# Patient Record
Sex: Female | Born: 1935 | Race: White | Hispanic: No | Smoking: Former smoker
Health system: Southern US, Community
[De-identification: ages and names within clinical notes are randomized; demographics above are authoritative.]

## PROBLEM LIST (undated history)

## (undated) DIAGNOSIS — S62609A Fracture of unspecified phalanx of unspecified finger, initial encounter for closed fracture: Secondary | ICD-10-CM

## (undated) DIAGNOSIS — N289 Disorder of kidney and ureter, unspecified: Secondary | ICD-10-CM

## (undated) DIAGNOSIS — Z950 Presence of cardiac pacemaker: Secondary | ICD-10-CM

## (undated) DIAGNOSIS — J449 Chronic obstructive pulmonary disease, unspecified: Secondary | ICD-10-CM

## (undated) DIAGNOSIS — J189 Pneumonia, unspecified organism: Secondary | ICD-10-CM

## (undated) DIAGNOSIS — E039 Hypothyroidism, unspecified: Secondary | ICD-10-CM

## (undated) DIAGNOSIS — F0390 Unspecified dementia without behavioral disturbance: Secondary | ICD-10-CM

## (undated) DIAGNOSIS — I10 Essential (primary) hypertension: Secondary | ICD-10-CM

## (undated) DIAGNOSIS — I4891 Unspecified atrial fibrillation: Secondary | ICD-10-CM

## (undated) DIAGNOSIS — H547 Unspecified visual loss: Secondary | ICD-10-CM

## (undated) DIAGNOSIS — F319 Bipolar disorder, unspecified: Secondary | ICD-10-CM

## (undated) DIAGNOSIS — I5030 Unspecified diastolic (congestive) heart failure: Secondary | ICD-10-CM

## (undated) DIAGNOSIS — F419 Anxiety disorder, unspecified: Secondary | ICD-10-CM

## (undated) HISTORY — DX: Disorder of kidney and ureter, unspecified: N28.9

## (undated) HISTORY — DX: Fracture of unspecified phalanx of unspecified finger, initial encounter for closed fracture: S62.609A

## (undated) HISTORY — DX: Hypothyroidism, unspecified: E03.9

## (undated) HISTORY — DX: Presence of cardiac pacemaker: Z95.0

## (undated) HISTORY — DX: Essential (primary) hypertension: I10

## (undated) HISTORY — DX: Unspecified visual loss: H54.7

## (undated) HISTORY — DX: Chronic obstructive pulmonary disease, unspecified: J44.9

## (undated) HISTORY — PX: INSERT / REPLACE / REMOVE PACEMAKER: SUR710

---

## 2011-01-11 ENCOUNTER — Emergency Department (HOSPITAL_COMMUNITY)
Admission: EM | Admit: 2011-01-11 | Discharge: 2011-01-11 | Disposition: A | Discharge status: Home / Self Care | Payer: MEDICARE | Attending: Emergency Medicine | Admitting: Emergency Medicine

## 2011-01-11 ENCOUNTER — Encounter (HOSPITAL_COMMUNITY): Payer: Self-pay

## 2011-01-11 ENCOUNTER — Emergency Department (HOSPITAL_COMMUNITY): Payer: MEDICARE

## 2011-01-11 DIAGNOSIS — W19XXXA Unspecified fall, initial encounter: Secondary | ICD-10-CM | POA: Insufficient documentation

## 2011-01-11 DIAGNOSIS — M25529 Pain in unspecified elbow: Secondary | ICD-10-CM | POA: Insufficient documentation

## 2011-01-11 DIAGNOSIS — J4489 Other specified chronic obstructive pulmonary disease: Secondary | ICD-10-CM | POA: Insufficient documentation

## 2011-01-11 DIAGNOSIS — M542 Cervicalgia: Secondary | ICD-10-CM | POA: Insufficient documentation

## 2011-01-11 DIAGNOSIS — Y9210 Unspecified residential institution as the place of occurrence of the external cause: Secondary | ICD-10-CM | POA: Insufficient documentation

## 2011-01-11 DIAGNOSIS — J449 Chronic obstructive pulmonary disease, unspecified: Secondary | ICD-10-CM | POA: Insufficient documentation

## 2011-01-11 DIAGNOSIS — E039 Hypothyroidism, unspecified: Secondary | ICD-10-CM | POA: Insufficient documentation

## 2011-01-11 DIAGNOSIS — H543 Unqualified visual loss, both eyes: Secondary | ICD-10-CM | POA: Insufficient documentation

## 2011-01-11 DIAGNOSIS — T148XXA Other injury of unspecified body region, initial encounter: Secondary | ICD-10-CM | POA: Insufficient documentation

## 2011-01-11 DIAGNOSIS — I10 Essential (primary) hypertension: Secondary | ICD-10-CM | POA: Insufficient documentation

## 2011-01-11 DIAGNOSIS — I495 Sick sinus syndrome: Secondary | ICD-10-CM | POA: Insufficient documentation

## 2011-01-11 DIAGNOSIS — Z950 Presence of cardiac pacemaker: Secondary | ICD-10-CM | POA: Insufficient documentation

## 2011-01-11 DIAGNOSIS — R51 Headache: Secondary | ICD-10-CM | POA: Insufficient documentation

## 2011-01-11 DIAGNOSIS — F319 Bipolar disorder, unspecified: Secondary | ICD-10-CM | POA: Insufficient documentation

## 2011-03-30 ENCOUNTER — Emergency Department (HOSPITAL_COMMUNITY)
Admission: EM | Admit: 2011-03-30 | Discharge: 2011-03-30 | Disposition: A | Discharge status: Home / Self Care | Payer: BC Managed Care – PPO | Attending: Emergency Medicine | Admitting: Emergency Medicine

## 2011-03-30 ENCOUNTER — Emergency Department (HOSPITAL_COMMUNITY): Payer: BC Managed Care – PPO

## 2011-03-30 DIAGNOSIS — J449 Chronic obstructive pulmonary disease, unspecified: Secondary | ICD-10-CM | POA: Insufficient documentation

## 2011-03-30 DIAGNOSIS — Z79899 Other long term (current) drug therapy: Secondary | ICD-10-CM | POA: Insufficient documentation

## 2011-03-30 DIAGNOSIS — H543 Unqualified visual loss, both eyes: Secondary | ICD-10-CM | POA: Insufficient documentation

## 2011-03-30 DIAGNOSIS — E039 Hypothyroidism, unspecified: Secondary | ICD-10-CM | POA: Insufficient documentation

## 2011-03-30 DIAGNOSIS — F319 Bipolar disorder, unspecified: Secondary | ICD-10-CM | POA: Insufficient documentation

## 2011-03-30 DIAGNOSIS — R070 Pain in throat: Secondary | ICD-10-CM | POA: Insufficient documentation

## 2011-03-30 DIAGNOSIS — J4489 Other specified chronic obstructive pulmonary disease: Secondary | ICD-10-CM | POA: Insufficient documentation

## 2011-03-30 DIAGNOSIS — I10 Essential (primary) hypertension: Secondary | ICD-10-CM | POA: Insufficient documentation

## 2011-03-30 DIAGNOSIS — Z950 Presence of cardiac pacemaker: Secondary | ICD-10-CM | POA: Insufficient documentation

## 2011-03-30 LAB — CBC
HCT: 42.1 % (ref 36.0–46.0)
Hemoglobin: 14.5 g/dL (ref 12.0–15.0)
MCH: 34.1 pg — ABNORMAL HIGH (ref 26.0–34.0)
MCHC: 34.4 g/dL (ref 30.0–36.0)
MCV: 99.1 fL (ref 78.0–100.0)
Platelets: 171 K/uL (ref 150–400)
RBC: 4.25 MIL/uL (ref 3.87–5.11)
RDW: 13.1 % (ref 11.5–15.5)
WBC: 7.1 K/uL (ref 4.0–10.5)

## 2011-03-30 LAB — RAPID STREP SCREEN (MED CTR MEBANE ONLY): Streptococcus, Group A Screen (Direct): NEGATIVE

## 2011-03-30 LAB — URINALYSIS, ROUTINE W REFLEX MICROSCOPIC
Bilirubin Urine: NEGATIVE
Glucose, UA: NEGATIVE mg/dL
Hgb urine dipstick: NEGATIVE
Ketones, ur: NEGATIVE mg/dL
Nitrite: NEGATIVE
Protein, ur: NEGATIVE mg/dL
Specific Gravity, Urine: 1.012 (ref 1.005–1.030)
Urobilinogen, UA: 0.2 mg/dL (ref 0.0–1.0)
pH: 8 (ref 5.0–8.0)

## 2011-03-30 LAB — BASIC METABOLIC PANEL
Chloride: 106 mEq/L (ref 96–112)
Creatinine, Ser: 0.77 mg/dL (ref 0.4–1.2)
GFR calc Af Amer: >60 mL/min (ref >60)
Potassium: 4.1 mEq/L (ref 3.5–5.1)
Sodium: 141 mEq/L (ref 135–145)

## 2011-03-30 LAB — POCT CARDIAC MARKERS
CKMB, poc: <1 ng/mL — ABNORMAL LOW (ref 1.0–8.0)
Troponin i, poc: <0.05 ng/mL (ref 0.00–0.09)

## 2011-03-30 LAB — DIFFERENTIAL
Basophils Absolute: 0 10*3/uL (ref 0.0–0.1)
Eosinophils Relative: 2 % (ref 0–5)
Lymphocytes Relative: 25 % (ref 12–46)
Monocytes Relative: 13 % — ABNORMAL HIGH (ref 3–12)

## 2011-03-30 MED ORDER — IOHEXOL 300 MG/ML  SOLN
75.0000 mL | Freq: Once | INTRAMUSCULAR | Status: AC | PRN
  Administered 2011-03-30: 75 mL via INTRAVENOUS

## 2011-04-01 LAB — URINE CULTURE

## 2011-04-22 ENCOUNTER — Emergency Department (HOSPITAL_COMMUNITY): Payer: Medicare Other

## 2011-04-22 ENCOUNTER — Emergency Department (HOSPITAL_COMMUNITY)
Admission: EM | Admit: 2011-04-22 | Discharge: 2011-04-22 | Disposition: A | Discharge status: Home / Self Care | Payer: Medicare Other | Attending: Emergency Medicine | Admitting: Emergency Medicine

## 2011-04-22 DIAGNOSIS — E039 Hypothyroidism, unspecified: Secondary | ICD-10-CM | POA: Insufficient documentation

## 2011-04-22 DIAGNOSIS — Z950 Presence of cardiac pacemaker: Secondary | ICD-10-CM | POA: Insufficient documentation

## 2011-04-22 DIAGNOSIS — R5381 Other malaise: Secondary | ICD-10-CM | POA: Insufficient documentation

## 2011-04-22 DIAGNOSIS — E860 Dehydration: Secondary | ICD-10-CM | POA: Insufficient documentation

## 2011-04-22 DIAGNOSIS — I10 Essential (primary) hypertension: Secondary | ICD-10-CM | POA: Insufficient documentation

## 2011-04-22 DIAGNOSIS — R197 Diarrhea, unspecified: Secondary | ICD-10-CM | POA: Insufficient documentation

## 2011-04-22 LAB — COMPREHENSIVE METABOLIC PANEL
Albumin: 3 g/dL — ABNORMAL LOW (ref 3.5–5.2)
BUN: 14 mg/dL (ref 6–23)
Chloride: 105 mEq/L (ref 96–112)
Creatinine, Ser: 0.69 mg/dL (ref 0.4–1.2)
GFR calc non Af Amer: >60 mL/min (ref >60)
Total Bilirubin: 0.1 mg/dL — ABNORMAL LOW (ref 0.3–1.2)

## 2011-04-22 LAB — MAGNESIUM: Magnesium: 2.1 mg/dL (ref 1.5–2.5)

## 2011-04-27 ENCOUNTER — Encounter: Payer: Self-pay | Admitting: Internal Medicine

## 2011-05-17 ENCOUNTER — Encounter: Payer: Self-pay | Admitting: Internal Medicine

## 2011-05-19 ENCOUNTER — Institutional Professional Consult (permissible substitution): Payer: Medicare Other | Admitting: Internal Medicine

## 2011-05-23 ENCOUNTER — Emergency Department (HOSPITAL_COMMUNITY): Payer: Medicare Other

## 2011-05-23 ENCOUNTER — Inpatient Hospital Stay (HOSPITAL_COMMUNITY)
Admission: EM | Admit: 2011-05-23 | Discharge: 2011-05-31 | DRG: 291 | Disposition: A | Discharge status: Skilled Nursing Facility | Payer: Medicare Other | Attending: Internal Medicine | Admitting: Internal Medicine

## 2011-05-23 DIAGNOSIS — G9341 Metabolic encephalopathy: Secondary | ICD-10-CM | POA: Diagnosis present

## 2011-05-23 DIAGNOSIS — Y9210 Unspecified residential institution as the place of occurrence of the external cause: Secondary | ICD-10-CM | POA: Diagnosis present

## 2011-05-23 DIAGNOSIS — I509 Heart failure, unspecified: Secondary | ICD-10-CM

## 2011-05-23 DIAGNOSIS — J189 Pneumonia, unspecified organism: Secondary | ICD-10-CM | POA: Diagnosis present

## 2011-05-23 DIAGNOSIS — Z950 Presence of cardiac pacemaker: Secondary | ICD-10-CM

## 2011-05-23 DIAGNOSIS — J4489 Other specified chronic obstructive pulmonary disease: Secondary | ICD-10-CM | POA: Diagnosis present

## 2011-05-23 DIAGNOSIS — J9600 Acute respiratory failure, unspecified whether with hypoxia or hypercapnia: Secondary | ICD-10-CM | POA: Diagnosis present

## 2011-05-23 DIAGNOSIS — H548 Legal blindness, as defined in USA: Secondary | ICD-10-CM | POA: Diagnosis present

## 2011-05-23 DIAGNOSIS — F319 Bipolar disorder, unspecified: Secondary | ICD-10-CM | POA: Diagnosis present

## 2011-05-23 DIAGNOSIS — I5031 Acute diastolic (congestive) heart failure: Principal | ICD-10-CM | POA: Diagnosis present

## 2011-05-23 DIAGNOSIS — E039 Hypothyroidism, unspecified: Secondary | ICD-10-CM | POA: Diagnosis present

## 2011-05-23 DIAGNOSIS — J449 Chronic obstructive pulmonary disease, unspecified: Secondary | ICD-10-CM | POA: Diagnosis present

## 2011-05-23 DIAGNOSIS — W010XXA Fall on same level from slipping, tripping and stumbling without subsequent striking against object, initial encounter: Secondary | ICD-10-CM | POA: Diagnosis present

## 2011-05-23 DIAGNOSIS — S62009A Unspecified fracture of navicular [scaphoid] bone of unspecified wrist, initial encounter for closed fracture: Secondary | ICD-10-CM | POA: Diagnosis present

## 2011-05-23 DIAGNOSIS — I10 Essential (primary) hypertension: Secondary | ICD-10-CM | POA: Diagnosis present

## 2011-05-23 LAB — CBC
HCT: 39.7 % (ref 36.0–46.0)
Hemoglobin: 13.2 g/dL (ref 12.0–15.0)
MCH: 33.2 pg (ref 26.0–34.0)
MCHC: 33.2 g/dL (ref 30.0–36.0)
MCV: 100 fL (ref 78.0–100.0)

## 2011-05-23 LAB — BASIC METABOLIC PANEL
BUN: 18 mg/dL (ref 6–23)
CO2: 27 mEq/L (ref 19–32)
Calcium: 8.5 mg/dL (ref 8.4–10.5)
GFR calc non Af Amer: >60 mL/min (ref >60)
Glucose, Bld: 88 mg/dL (ref 70–99)

## 2011-05-23 LAB — PRO B NATRIURETIC PEPTIDE: Pro B Natriuretic peptide (BNP): 207.4 pg/mL (ref 0–450)

## 2011-05-23 LAB — CARDIAC PANEL(CRET KIN+CKTOT+MB+TROPI)
CK, MB: 2.5 ng/mL (ref 0.3–4.0)
Relative Index: INVALID (ref 0.0–2.5)
Total CK: 50 U/L (ref 7–177)

## 2011-05-23 LAB — URINALYSIS, ROUTINE W REFLEX MICROSCOPIC
Bilirubin Urine: NEGATIVE
Ketones, ur: NEGATIVE mg/dL
Nitrite: NEGATIVE
Urobilinogen, UA: 0.2 mg/dL (ref 0.0–1.0)
pH: 7.5 (ref 5.0–8.0)

## 2011-05-23 LAB — URINE MICROSCOPIC-ADD ON

## 2011-05-23 LAB — MRSA PCR SCREENING: MRSA by PCR: NEGATIVE

## 2011-05-24 ENCOUNTER — Inpatient Hospital Stay (HOSPITAL_COMMUNITY): Payer: Medicare Other

## 2011-05-24 DIAGNOSIS — I517 Cardiomegaly: Secondary | ICD-10-CM

## 2011-05-24 DIAGNOSIS — I5023 Acute on chronic systolic (congestive) heart failure: Secondary | ICD-10-CM

## 2011-05-24 LAB — LIPID PANEL
HDL: 64 mg/dL (ref >39)
LDL Cholesterol: 50 mg/dL (ref 0–99)
Total CHOL/HDL Ratio: 2.1 RATIO
VLDL: 22 mg/dL (ref 0–40)

## 2011-05-24 LAB — CBC
MCH: 32.7 pg (ref 26.0–34.0)
MCV: 101 fL — ABNORMAL HIGH (ref 78.0–100.0)
Platelets: 158 10*3/uL (ref 150–400)
RBC: 4.19 MIL/uL (ref 3.87–5.11)
RDW: 13.3 % (ref 11.5–15.5)

## 2011-05-24 LAB — BASIC METABOLIC PANEL
CO2: 24 mEq/L (ref 19–32)
Calcium: 9.3 mg/dL (ref 8.4–10.5)
Creatinine, Ser: 0.71 mg/dL (ref 0.50–1.10)
GFR calc Af Amer: >60 mL/min (ref >60)
GFR calc non Af Amer: >60 mL/min (ref >60)
Sodium: 137 mEq/L (ref 135–145)

## 2011-05-24 LAB — HEMOGLOBIN A1C
Hgb A1c MFr Bld: 5.7 % — ABNORMAL HIGH (ref <5.7)
Mean Plasma Glucose: 117 mg/dL — ABNORMAL HIGH (ref <117)

## 2011-05-24 LAB — CARDIAC PANEL(CRET KIN+CKTOT+MB+TROPI)
CK, MB: 2.1 ng/mL (ref 0.3–4.0)
Relative Index: 1.4 (ref 0.0–2.5)
Total CK: 113 U/L (ref 7–177)
Total CK: 49 U/L (ref 7–177)
Troponin I: <0.3 ng/mL (ref <0.30)

## 2011-05-24 LAB — MAGNESIUM: Magnesium: 2.3 mg/dL (ref 1.5–2.5)

## 2011-05-25 ENCOUNTER — Inpatient Hospital Stay (HOSPITAL_COMMUNITY): Payer: Medicare Other

## 2011-05-25 LAB — DIFFERENTIAL
Basophils Absolute: 0 10*3/uL (ref 0.0–0.1)
Eosinophils Relative: 0 % (ref 0–5)
Lymphocytes Relative: 13 % (ref 12–46)
Monocytes Relative: 26 % — ABNORMAL HIGH (ref 3–12)
Neutro Abs: 5.2 10*3/uL (ref 1.7–7.7)

## 2011-05-25 LAB — CBC
Hemoglobin: 13.5 g/dL (ref 12.0–15.0)
MCV: 99.5 fL (ref 78.0–100.0)
Platelets: 154 10*3/uL (ref 150–400)
RBC: 4.12 MIL/uL (ref 3.87–5.11)
WBC: 8.5 10*3/uL (ref 4.0–10.5)

## 2011-05-25 LAB — BASIC METABOLIC PANEL
CO2: 25 mEq/L (ref 19–32)
Chloride: 102 mEq/L (ref 96–112)
Glucose, Bld: 116 mg/dL — ABNORMAL HIGH (ref 70–99)
Sodium: 139 mEq/L (ref 135–145)

## 2011-05-25 LAB — BLOOD GAS, ARTERIAL
Bicarbonate: 25.7 mEq/L — ABNORMAL HIGH (ref 20.0–24.0)
O2 Saturation: 91.2 %
Patient temperature: 98.6
TCO2: 22.1 mmol/L (ref 0–100)
pO2, Arterial: 56.4 mmHg — ABNORMAL LOW (ref 80.0–100.0)

## 2011-05-25 NOTE — Consult Note (Signed)
NAMEJAYNIE, [name]              ACCOUNT NO.:  [id number]  MEDICAL RECORD NO.:  [id number]  LOCATION:  1502                         FACILITY:  [facility]  PHYSICIAN:  Verne Carrow, MDDATE OF BIRTH:  [birth year]  DATE OF CONSULTATION:  05/23/2011 DATE OF DISCHARGE:                                CONSULTATION   REASON FOR CONSULTATION:  Congestive heart failure.  HISTORY OF PRESENT ILLNESS:  [name] is a pleasant 77 year old Caucasian female with a history of pacemaker insertion for unknown reason in [place] in 2011, COPD, hypothyroidism, hypertension and legal blindness who was admitted to [facility] after a fall. The patient states that she felt dizzy and tripped on her bed sheet. Review of radiology film showed no evidence of fracture.  The patient denies having had any chest pain or shortness of breath.  We are consulted for potential assistance in evaluation and management of congestive heart failure.  The patient's cardiac history is unknown as this was all performed in hospital in [place].  PAST MEDICAL HISTORY: 1. COPD. 2. Hypothyroidism. 3. Legal blindness. 4. Manic depressive disorder. 5. Hypertension. 6. Status post pacemaker insertion in July 2011 in [facility]     in [place].  PAST SURGICAL HISTORY: 1. Pacemaker insertion. 2. Right oophorectomy.  ALLERGIES:  No known drug allergies.  CURRENT MEDICATIONS: 1. Depakote 500 mg p.o. b.i.d. 2. Lovenox 40 mg subcutaneously once daily. 3. Lasix 20 mg IV b.i.d. 4. Synthroid 100 mcg p.o. once daily. 5. Ditropan 2.5 mg p.o. twice daily. 6. Potassium chloride 20 mEq p.o. twice daily. 7. Risperdal 1 mg p.o. b.i.d.  SOCIAL HISTORY:  The patient is a former heavy tobacco abuser, having smoked 1 pack per day for 6 years.  She tells me that she stopped smoking 2 years ago.  She denies use of alcohol or drugs.  She is currently widowed.  She lives here in [place] in retirement  home. She relocated to [facility] within the last 12 months, although she is not sure when.  FAMILY HISTORY:  There is no family history of coronary artery disease.  REVIEW OF SYSTEMS:  As stated in the history of present illness and is otherwise negative.  PHYSICAL EXAMINATION:  VITAL SIGNS:  Temperature 98.7, pulse 69 and regular.  Respirations 18 and unlabored, blood pressure 101/70. GENERAL:  She is a pleasant elderly Caucasian female in no acute distress.  She is alert and oriented x3. PSYCHIATRIC:  Mood and affect are appropriate. NEUROLOGICAL:  Nonfocal. MUSCULOSKELETAL:  Moves all extremities equally.  There is a brace present in the left wrist. SKIN:  Warm and dry. HEENT:  Poor dentition.  Mucous membranes are moist. NECK:  No JVD.  No carotid bruits. LUNGS:  Clear to auscultation bilaterally without wheezes, rhonchi or crackles noted. CARDIOVASCULAR:  Regular rate and rhythm without murmurs, gallops or rubs noted. ABDOMEN:  Soft, nontender.  Bowel sounds are present. EXTREMITIES:  No evidence of edema.  All extremities are warm to touch.  DIAGNOSTIC STUDIES: 1. Laboratory value show a hemoglobin of 13, platelets 129, white     blood cell count 11,000, potassium 3.6, creatinine 0.76, CK 45, CK-     MB 2.1, troponin less  than 0.30. 2. No 12-lead EKG is available for review. 3. Chest x-ray performed today shows findings of chronic persistent     pulmonary edema.  There is also a lung nodule that was not     mentioned on prior films. 4. CT of the head shows no acute abnormality.  ASSESSMENT/PLAN: 1. Dizziness with subsequent fall which may have been related to     patient losing her balance after tripping.  There is no clear     evidence of myocardial ischemia.  First set of cardiac enzymes are     negative.  I would agree with checking an echocardiogram to assess     her LV function.  We will monitor her on telemetry to see if there     is any evidence of heart block  or arrhythmias.  I would also agree     with cycling cardiac enzymes.  I would also like to get a 12-lead     EKG at this time as there is none available for review. 2. Mild volume overload consistent with congestive heart failure.  Her     x-ray has shown chronic changes while looking at serial imaging.     At this time, I would recommend continuing the Lasix and potassium     supplementation as written.  I would continue to follow with you     and make further recommendations as testing is complete.     Verne Carrow, MD     CM/MEDQ  D:  05/23/2011  T:  05/23/2011  Job:  [id number]  Electronically Signed by Verne Carrow MD on 05/25/2011 08:25:04 AM

## 2011-05-29 LAB — CBC
MCH: 32.9 pg (ref 26.0–34.0)
MCHC: 32.8 g/dL (ref 30.0–36.0)
Platelets: 224 10*3/uL (ref 150–400)
RDW: 13 % (ref 11.5–15.5)

## 2011-05-29 LAB — BASIC METABOLIC PANEL
Calcium: 10.3 mg/dL (ref 8.4–10.5)
GFR calc Af Amer: >60 mL/min (ref >60)
GFR calc non Af Amer: >60 mL/min (ref >60)
Potassium: 4.8 mEq/L (ref 3.5–5.1)
Sodium: 136 mEq/L (ref 135–145)

## 2011-05-29 LAB — MAGNESIUM: Magnesium: 2.2 mg/dL (ref 1.5–2.5)

## 2011-06-05 ENCOUNTER — Inpatient Hospital Stay (HOSPITAL_COMMUNITY)
Admission: EM | Admit: 2011-06-05 | Discharge: 2011-06-14 | DRG: 194 | Disposition: A | Discharge status: Skilled Nursing Facility | Payer: Medicare Other | Attending: Internal Medicine | Admitting: Internal Medicine

## 2011-06-05 ENCOUNTER — Emergency Department (HOSPITAL_COMMUNITY): Payer: Medicare Other

## 2011-06-05 DIAGNOSIS — R1310 Dysphagia, unspecified: Secondary | ICD-10-CM | POA: Diagnosis present

## 2011-06-05 DIAGNOSIS — I10 Essential (primary) hypertension: Secondary | ICD-10-CM | POA: Diagnosis present

## 2011-06-05 DIAGNOSIS — F03918 Unspecified dementia, unspecified severity, with other behavioral disturbance: Secondary | ICD-10-CM | POA: Diagnosis present

## 2011-06-05 DIAGNOSIS — H543 Unqualified visual loss, both eyes: Secondary | ICD-10-CM | POA: Diagnosis present

## 2011-06-05 DIAGNOSIS — E039 Hypothyroidism, unspecified: Secondary | ICD-10-CM | POA: Diagnosis present

## 2011-06-05 DIAGNOSIS — J4489 Other specified chronic obstructive pulmonary disease: Secondary | ICD-10-CM | POA: Diagnosis present

## 2011-06-05 DIAGNOSIS — F0391 Unspecified dementia with behavioral disturbance: Secondary | ICD-10-CM | POA: Diagnosis present

## 2011-06-05 DIAGNOSIS — Z950 Presence of cardiac pacemaker: Secondary | ICD-10-CM

## 2011-06-05 DIAGNOSIS — J449 Chronic obstructive pulmonary disease, unspecified: Secondary | ICD-10-CM | POA: Diagnosis present

## 2011-06-05 DIAGNOSIS — F319 Bipolar disorder, unspecified: Secondary | ICD-10-CM | POA: Diagnosis present

## 2011-06-05 DIAGNOSIS — I4891 Unspecified atrial fibrillation: Secondary | ICD-10-CM | POA: Diagnosis not present

## 2011-06-05 DIAGNOSIS — J189 Pneumonia, unspecified organism: Principal | ICD-10-CM | POA: Diagnosis present

## 2011-06-05 DIAGNOSIS — I959 Hypotension, unspecified: Secondary | ICD-10-CM | POA: Diagnosis not present

## 2011-06-05 DIAGNOSIS — H1045 Other chronic allergic conjunctivitis: Secondary | ICD-10-CM | POA: Diagnosis present

## 2011-06-06 ENCOUNTER — Emergency Department (HOSPITAL_COMMUNITY): Payer: Medicare Other

## 2011-06-06 LAB — CARDIAC PANEL(CRET KIN+CKTOT+MB+TROPI)
CK, MB: 3.4 ng/mL (ref 0.3–4.0)
CK, MB: 3.3 ng/mL (ref 0.3–4.0)
CK, MB: 3.5 ng/mL (ref 0.3–4.0)
Relative Index: INVALID (ref 0.0–2.5)
Relative Index: INVALID (ref 0.0–2.5)
Total CK: 71 U/L (ref 7–177)
Total CK: 82 U/L (ref 7–177)
Troponin I: <0.3 ng/mL (ref <0.30)
Troponin I: <0.3 ng/mL (ref <0.30)
Troponin I: <0.3 ng/mL (ref <0.30)

## 2011-06-06 LAB — BASIC METABOLIC PANEL
BUN: 16 mg/dL (ref 6–23)
BUN: 20 mg/dL (ref 6–23)
CO2: 29 mEq/L (ref 19–32)
CO2: 31 mEq/L (ref 19–32)
Calcium: 9 mg/dL (ref 8.4–10.5)
Calcium: 9.8 mg/dL (ref 8.4–10.5)
Chloride: 100 mEq/L (ref 96–112)
Chloride: 101 mEq/L (ref 96–112)
Creatinine, Ser: 0.8 mg/dL (ref 0.50–1.10)
Creatinine, Ser: 0.85 mg/dL (ref 0.50–1.10)
GFR calc Af Amer: >60 mL/min (ref >60)
GFR calc Af Amer: >60 mL/min (ref >60)
GFR calc non Af Amer: >60 mL/min (ref >60)
GFR calc non Af Amer: >60 mL/min (ref >60)
Glucose, Bld: 108 mg/dL — ABNORMAL HIGH (ref 70–99)
Glucose, Bld: 96 mg/dL (ref 70–99)
Potassium: 3.7 mEq/L (ref 3.5–5.1)
Potassium: 3.9 mEq/L (ref 3.5–5.1)
Sodium: 139 mEq/L (ref 135–145)
Sodium: 140 mEq/L (ref 135–145)

## 2011-06-06 LAB — CBC
HCT: 36.1 % (ref 36.0–46.0)
HCT: 38.6 % (ref 36.0–46.0)
Hemoglobin: 12.2 g/dL (ref 12.0–15.0)
Hemoglobin: 13.2 g/dL (ref 12.0–15.0)
MCH: 33.6 pg (ref 26.0–34.0)
MCH: 34 pg (ref 26.0–34.0)
MCHC: 33.8 g/dL (ref 30.0–36.0)
MCHC: 34.2 g/dL (ref 30.0–36.0)
MCV: 99.4 fL (ref 78.0–100.0)
MCV: 99.5 fL (ref 78.0–100.0)
Platelets: 282 10*3/uL (ref 150–400)
Platelets: 292 10*3/uL (ref 150–400)
RBC: 3.63 MIL/uL — ABNORMAL LOW (ref 3.87–5.11)
RBC: 3.88 MIL/uL (ref 3.87–5.11)
RDW: 13.1 % (ref 11.5–15.5)
RDW: 13.1 % (ref 11.5–15.5)
WBC: 11.1 10*3/uL — ABNORMAL HIGH (ref 4.0–10.5)
WBC: 12.9 10*3/uL — ABNORMAL HIGH (ref 4.0–10.5)

## 2011-06-06 LAB — DIFFERENTIAL
Basophils Absolute: 0 10*3/uL (ref 0.0–0.1)
Basophils Relative: 0 % (ref 0–1)
Eosinophils Absolute: 0.2 10*3/uL (ref 0.0–0.7)
Monocytes Relative: 7 % (ref 3–12)
Neutro Abs: 8.7 10*3/uL — ABNORMAL HIGH (ref 1.7–7.7)
Neutrophils Relative %: 68 % (ref 43–77)

## 2011-06-06 LAB — PRO B NATRIURETIC PEPTIDE: Pro B Natriuretic peptide (BNP): 202.1 pg/mL (ref 0–450)

## 2011-06-06 MED ORDER — IOHEXOL 300 MG/ML  SOLN
100.0000 mL | Freq: Once | INTRAMUSCULAR | Status: AC | PRN
  Administered 2011-06-06: 100 mL via INTRAVENOUS

## 2011-06-06 NOTE — H&P (Signed)
[name], [name]              ACCOUNT NO.:  [id number]  MEDICAL RECORD NO.:  [id number]  LOCATION:  [facility]                         FACILITY:  [facility]  PHYSICIAN:  Hilary Hertz, MD      DATE OF BIRTH:  [birth year]  DATE OF ADMISSION:  06/05/2011 DATE OF DISCHARGE:                             HISTORY & PHYSICAL   PRIMARY CARE PHYSICIAN:  Unassigned.  CHIEF COMPLAINT:  Chest pain.  HISTORY OF PRESENT ILLNESS:  The patient is an 77 year old woman with a history of COPD, hypothyroidism, bipolar disorder who complains of chest pain.  The patient was recently admitted and discharged at the middle of July for mechanical fall and shortness of breath.  The patient underwent a workup for acute coronary syndrome as the cause of the fall which was negative.  She underwent an echo which showed an EF of 60-65% and she was diagnosed with a scaphoid fracture during that admission.  The patient was discharged to a rehab facility to get physical therapy.  She returns today with chest pain.  During that admission, the patient was also treated for community-acquired pneumonia.  She returns today with chest pain and shortness of breath.  She reports that she has left-sided chest pain underneath her breast that hurts every time she takes a deep breath.  She is also reporting a cough.  The pain at this point is 6/10 and sharp and worse with deep breathing.  It is unknown if she has had any fevers.  She denies any other acute complaints.  REVIEW OF SYSTEMS:  Review of 10 organ systems was done and is negative except as stated above in the HPI.  ALLERGIES:  No known drug allergies.  MEDICATIONS: 1. Melatonin 1 tab daily. 2. Tylenol as needed. 3. Senna 1 tab twice daily. 4. Risperdal 1 mg twice daily. 5. Oxybutynin 2.5 mg once daily. 6. Os-Cal three times a day. 7. MiraLax as needed. 8. Clotrimazole/betamethasone cream applied three times a day under     her breast. 9. Levothyroxine 100 mcg  daily. 10.Guaifenesin and dextromethorphan every 8 hours as needed. 11.Divalproex 500 mg twice a day. 12.Ambien as needed 10 mg.  PAST MEDICAL HISTORY: 1. COPD. 2. Hypothyroidism. 3. Legal blindness. 4. Manic depressive disorder/bipolar disorder. 5. Hypertension. 6. Pacemaker, July 2011 in [place].  SOCIAL HISTORY:  The patient has a 40 pack-year smoking history.  She quit 2 years ago.  She uses alcohol occasionally.  Family history is negative for early CAD.  PHYSICAL EXAMINATION:  VITAL SIGNS:  109/77, 60, 16, 97.0. GENERAL:  The patient is in no acute distress. HEENT:  Mucous membranes are moist.  Poor dentition. CV: Regular rate and rhythm.  No murmurs, rubs, or gallops. LUNGS:  It is difficult for the patient to cooperate with the exam as deep breathing reproduces pain in her left chest.  There are decreased breath sounds throughout.  I do not hear any frank wheezes.  It is difficult to tell if there are any crackles anywhere due to the patient's poor cooperation. ABDOMEN:  Soft, nontender, nondistended. EXTREMITIES:  No lower extremity edema, although her shins are both slightly tender to touch. PSYCH:  Flat affect. NEURO:  Nonfocal.  LABORATORY DATA:  White count 12.9, hemoglobin 13.2, platelets 282. Sodium 140, potassium 3.9, chloride 100, bicarb 31, glucose 96, BUN 20, creatinine 0.85.  Chest x-ray shows interval increased left lung base consolidation, atelectasis versus pneumonia, likely a small associated pleural effusion as well similar cardiomegaly and central vascular congestion, clearly a very mild pulmonary edema pattern.  CT angio chest; negative for acute PE or thoracic aortic dissection, moderate hiatal hernia, patchy airspace infiltrate in the superior segment of the left lower lobe, possible pneumonia.  EKG; A paced at a rate of 67.  No acute ischemic changes.  ASSESSMENT AND PLAN:  The patient is an 77 year old woman with bipolar disorder,  hypertension, chronic obstructive pulmonary disease, recent admission for mechanical fall, and pneumonia who was at rehabilitation comes in with chest pain, 1. Chest pain.  At this point, the patient's chest pain is pleuritic     in nature and her CT scan shows pneumonia in that area.  This is     probably pleurisy from the underlying pneumonia.  We will treat the     patient with broad-spectrum antibiotics, vancomycin and cefepime     given her recent hospitalization.  Given her cardiac risk factors     of age and hypertension, we will rule her out for an acute coronary     syndrome with checking cardiac enzymes and monitoring her on tele.     Her EKG does not show any signs of acute ischemia.  We will treat     the pain with Tylenol and Vicodin and morphine if needed, 2. Healthcare-associated pneumonia or hospital-acquired pneumonia.  We     will put the patient on broad-spectrum antibiotics of vancomycin     and cefepime and we will give her supplemental oxygen as needed. 3. Shortness of breath.  This is likely due to the patient's     pneumonia; however, she does have signs of potential fluid overload     on her imaging.  We will check a beta natriuretic peptide to help     assess her volume status.  An echo done last admission shows that     she has an EF of 60-65% with grade 1 diastolic dysfunction. 4. Bipolar disorder.  Continue the patient's home Risperdal and     divalproex. 5. Hypothyroidism.  Continue the patient's home levothyroxine. 6. Questionable history of chronic obstructive pulmonary disease.  The     patient is not on any therapies for this at this time.  We will     give her nebulizers p.r.n. 7. Questionable history of hypertension by chart review.  The patient     is not currently on any medications for this. 8. Fluids, electrolytes, nutrition.  Hep-Lock IV.  Replace     electrolytes as needed.  Put the patient on a heart     healthy diet. 9. Prophylaxis.  Lovenox  for DVT prophylaxis.  DISPOSITION:  Full code.  The patient will be admitted to [facility] for further evaluation.          ______________________________ Hilary Hertz, MD     JF/MEDQ  D:  06/06/2011  T:  06/06/2011  Job:  [id number]  Electronically Signed by Hilary Hertz MD on 06/06/2011 07:05:18 AM

## 2011-06-07 LAB — CBC
Hemoglobin: 11.7 g/dL — ABNORMAL LOW (ref 12.0–15.0)
RBC: 3.54 MIL/uL — ABNORMAL LOW (ref 3.87–5.11)
WBC: 13.7 10*3/uL — ABNORMAL HIGH (ref 4.0–10.5)

## 2011-06-08 ENCOUNTER — Inpatient Hospital Stay (HOSPITAL_COMMUNITY): Payer: Medicare Other

## 2011-06-08 LAB — CBC
HCT: 34.2 % — ABNORMAL LOW (ref 36.0–46.0)
Hemoglobin: 11.9 g/dL — ABNORMAL LOW (ref 12.0–15.0)
MCHC: 34.8 g/dL (ref 30.0–36.0)

## 2011-06-08 LAB — BASIC METABOLIC PANEL
BUN: 11 mg/dL (ref 6–23)
Chloride: 106 mEq/L (ref 96–112)
GFR calc Af Amer: >60 mL/min (ref >60)
Glucose, Bld: 76 mg/dL (ref 70–99)
Potassium: 4.5 mEq/L (ref 3.5–5.1)

## 2011-06-08 NOTE — Discharge Summary (Signed)
[name], [name]              ACCOUNT NO.:  [id number]  MEDICAL RECORD NO.:  [id number]  LOCATION:  1502                         FACILITY:  [facility]  PHYSICIAN:  Kathlen Mody, MD       DATE OF BIRTH:  1928/06/27  DATE OF ADMISSION:  05/23/2011 DATE OF DISCHARGE:  05/26/2011                              DISCHARGE SUMMARY   PRIMARY CARE PHYSICIAN:  Florentina Jenny, M.D.  DISCHARGE DIAGNOSES: 1. Mechanical fall. 2. Hypothyroidism. 3. Acute diastolic heart failure. 4. Nondisplaced left scaphoid fracture. 5. Status post permanent pacemaker for arrhythmia. 6. Acute respiratory failure secondary to diastolic heart failure. 7. Pneumonia. 8. Chronic obstructive pulmonary disease. 9. Manic depressive behavior. 10.Legally blind.  DISCHARGE MEDICATIONS: 1. Avelox 400 mg p.o. daily for 5 more days. 2. Guaifenesin and dextromethorphan 5 mL q.8 h. p.r.n. 3. Ambien 10 mg 1 tablet at bedtime p.r.n. 4. MiraLAX 17 g p.o. daily p.r.n. 5. Levothyroxine 100 mcg 1 tablet p.o. daily. 6. Depakote 500 mg b.i.d. 7. Senna 1 tablet b.i.d. 8. Oxybutynin 2.5 mg 1 tablet b.i.d. 9. Risperidone 1 mg b.i.d. 10.Calcium carbonate 1 tablet t.i.d. 11.Lotrisone 3 applications daily. 12.Melatonin 1 tablet at bedtime. 13.Tylenol 650 mg p.o. q.4 h. p.r.n. for pain.  PERTINENT LABORATORY DATA:  The patient had a comprehensive metabolic panel on admission significant for glucose of 116.  CBC showed WBC count of 11.1, platelets of 129.  Troponin and CK-MB within normal limits. ProBNP was 207.  Next set of cardiac enzymes were negative.  MRSA PCR screen negative.  Urinalysis was negative for nitrites and leukocytes. TSH within normal limits.  Third set of cardiac enzymes were negative. On the day of discharge, the patient's CBC was within normal limits. Basic metabolic panel significant for glucose of 116.  RADIOLOGY: 1. On admission, the patient had a chest x-ray, showed mild CHF,     diffuse aeration of both  lung bases representing areas of edema and     multifocal consolidation. 2. Pelvic x-ray on May 23, 2011 shows no evidence of acute bony     abnormality, osteopenia, and mild degenerative changes. 3. Wrist x-ray on May 23, 2011 shows serpiginous lucency through the     mid aspect of the scaphoid possibly representing a nondisplaced     fracture.  Splinting and followup.  Wrist radiograph in 10-14 days     is recommended.  Chondrocalcinosis compatible with COPD. 4. CT head without contrast done on May 23, 2011 shows no evidence of     acute intracranial abnormality.  Mild atrophy.  Chronic small     vessel white matter ischemic changes and remote basal ganglia     infarct and no static evidence of acute injury to the cervical     spine.  Degenerative changes noted. 5. An elbow x-ray on May 23, 2011 shows no acute abnormalities. 6. Chest x-ray on May 23, 2011 shows findings suggestive of persistent     pulmonary edema.  Improved aeration of the bilateral lung bases     with persistent infrahilar and bibasilar opacities, possibly     atelectasis though underlying lung infection is not excluded.     Indeterminate 5-mm nodule overlies the  left mid lung, not     definitely seen on prior recent examinations.  If no comparisons     exist, a followup chest x-ray in 4-6 weeks after treatment is     recommended to ensure resolution. 7. A chest x-ray on May 24, 2011 shows stable to slight increase in     pulmonary edema pattern. 8. A repeat chest x-ray on May 25, 2011 shows pacemaker in place,     stable moderate cardiac silhouette enlargement, mild vascular     congestion pattern, no consolidation or pleural effusion, interval     improved aeration in the left lower lung.  BRIEF ADMISSION HISTORY:  This is an 77 year old lady with a history of pacemaker insertion, COPD, hypothyroidism and hypertension admitted status post mechanical fall at the assisted living facility.  Review of radiology  showed no evidence of fracture.  The patient was admitted for possible evaluation of mild congestive heart failure, pneumonia and mechanical fall.  HOSPITAL COURSE: 1. Mechanical fall.  As per the patient, the patient lost her balance     and fell.  She was also worked up for any arrhythmia.     Telemonitoring did not show any evidence of heart block or     arrhythmia.  Cardiac enzymes have been negative.  12-lead EKG did     not show any arrhythmias.  It was normal sinus rhythm.  A 2D     echocardiogram was done, which was within normal limits with good     systolic function of 60-65%.  There was a grade 1 diastolic     dysfunction of the left ventricle.  There were no regional wall     abnormalities.  Cardiology consult was called, suggested that she     might have very mild volume overload.  The patient initially was     put on IV Lasix for diuresis, was later transitioned to p.o. Lasix     but as her BUN was rising and she was getting dehydrated, the Lasix     was stopped for now.  It can be restarted later after discharge by     her PCP. 2. Pneumonia.  The patient was found to have bibasilar opacities,     possibly infectious.  When she came in, she had little bit of white     count.  She has been afebrile over the course of hospitalization.     So, she was started on antibiotics.  She is on Avelox at this time.     She will continue 5 more days of Avelox to complete the course of     antibiotics for her early pneumonia. 3. Respiratory failure most likely secondary to her diastolic heart     failure.  It is compensated at this time, and she is saturating 92%     on 2-2.5 liters of nasal cannula.  COPD appears to be stable. 4. Hypothyroidism.  Her TSH is within normal limits.  Continue the     same dose of levothyroxine. 5. Scaphoid fracture status post mechanical fall.  She will need a     repeat x-ray of the wrist in about 2 weeks to evaluate her scaphoid     fracture. 6.  Abnormal chest x-ray with 5-mm nodule in the left mid lung.  She     will need a repeat followup chest x-ray in about 4-6 weeks after     the treatment of pneumonia to evaluate the 5-mm nodule. 7.  Manic depressive behavior.  The patient is on risperidone,     Depakote.  We will continue the same at this time. 8. Somnolence, confused most likely secondary to her being on Ativan     in the last 3 days.  We have stopped the Ativan, and the patient is     more awake and alert at this time.  She will be required to be off     benzodiazepines.  PHYSICAL EXAMINATION ON DISCHARGE:  VITAL SIGNS:  On the day of discharge, the patient's vitals include temperature of 97.9, pulse of 59, blood pressure 136/80, saturating about 92% on 2 liters of nasal cannula, respiratory rate 18. GENERAL:  She is alert, afebrile, comfortable, no acute distress. CARDIOVASCULAR:  S1 and S2 heard. RESPIRATORY:  Chest clear to auscultation. ABDOMEN:  Soft, nontender, nondistended. EXTREMITIES:  No pedal edema.  DISPOSITION:  PT and OT evaluation was requested, recommended SNF placement.  The patient will be discharged to [facility] and will follow up with her PCP in about 1 week.  DIET:  As tolerated.  ACTIVITY:  As tolerated.  FOLLOWUP:  Follow up with PCP and get repeat x-ray of the wrist in about 2 weeks to evaluate the scaphoid fracture and get a repeat chest x-ray in about 4-6 weeks to evaluate the 5-mm nodule.  It has been pleasure taking care of [name].          ______________________________ Kathlen Mody, MD     VA/MEDQ  D:  05/26/2011  T:  05/26/2011  Job:  [id number]  Electronically Signed by Kathlen Mody MD on 06/08/2011 09:52:56 PM

## 2011-06-09 NOTE — H&P (Signed)
**Note [name]** [name], [name]              ACCOUNT NO.:  [id number]  MEDICAL RECORD NO.:  [id number]  LOCATION:  1510                         FACILITY:  [facility]  PHYSICIAN:  Erick Blinks, MD     DATE OF BIRTH:  08-Sep-1928  DATE OF ADMISSION:  05/23/2011 DATE OF DISCHARGE:                             HISTORY & PHYSICAL   PRIMARY CARE PHYSICIAN:  Florentina Jenny, MD  CHIEF COMPLAINT:  Mechanical fall and shortness of breath.  HISTORY OF PRESENT ILLNESS:  This is an 77 year old female who is a resident of an assisted living facility who was brought to the emergency room after having a mechanical fall.  The patient was in bed, was attempting to get out of bed and had her feet get caught up in the bedsheets.  This  caused her to fall on her wrist.  She was brought to the emergency room where she had extensive x-rays and CTs revealing a possible left wrist fracture.  The patient also reported to be short of breath.  While here in the emergency room was found to be hypoxic on room air with a saturation of 88% on room air.  Chest x-ray revealed pulmonary edema.  The patient was placed on oxygen and was subsequently referred for further workup.  She denies any recent chest pain although she does report having shortness of breath which has progressively been worse over the past month.  She is not the best historian but she denies any cough or fever.  Again, no chest pain or dizziness, no nausea or vomiting.  She does describe orthopnea and her shortness of breath has progressively been getting worse.  She does not elicit any significant lower extremity edema although it is noted on physical exam.  The patient will be admitted for further workup and treatment for possible CHF.  PAST MEDICAL HISTORY: 1. Hypothyroidism. 2. Status post pacemaker placement for possible arrhythmia. 3. Manic depressive behavior. 4. Legally blind. 5. COPD.  ALLERGIES:  No known drug allergies.  MEDICATIONS PRIOR TO  ADMISSION: 1. MiraLAX once daily as needed. 2. Ambien 10 mg by mouth at bedtime as needed. 3. Risperdal 1 mg by mouth twice daily. 4. Oxybutynin 2.5 mg by mouth twice daily. 5. Senna 8.6 mg. 6. Depakote 500 mg by mouth twice daily. 7. Synthroid 100 mcg by mouth daily. 8. Clotrimazole cream. 9. Melatonin. 10.Tylenol.  FAMILY HISTORY:  Reviewed and noncontributory for this 77 year old female.  SOCIAL HISTORY:  The patient is a recent smoker and quit a few years ago.  She also has not drank any alcohol in the past few years either. She lives at an assisted living and reports of being able to walk with a walker.  REVIEW OF SYSTEMS:  All systems have been reviewed and pertinent positives stated in the HPI, otherwise negative.  PHYSICAL EXAM:  VITAL SIGNS:  Temperature 98.7, heart rate 69, respirations 18, blood pressure 101/70, pulse oximetry 96% on 2 L of oxygen. GENERAL:  The patient is no acute distress lying in bed. HEENT:  Normocephalic, atraumatic.  Pupils are equal, round, reactive to light. NECK:  Supple. CHEST:  Minimal crackles at the bases bilaterally, otherwise no wheezes. Good air entry  bilaterally. HEART:  S1, S2 with a regular rate and rhythm. ABDOMEN:  Soft, nontender.  Bowel sounds are active. EXTREMITIES:  1+ edema bilaterally. NEUROLOGIC:  The patient moves all extremities spontaneously.  PERTINENT LABORATORY AND X-RAY DATA: 1. Sodium 143, potassium 3.6, chloride 105, bicarb 28, glucose 116,     BUN 14, creatinine 0.69.  Bilirubin 0.1, alkaline phosphatase 56,     AST 18, ALT 11, albumin 3, calcium 9.2, magnesium of 2.1.  CBC     shows WBC 11.1, hemoglobin of 13.2, platelet count of 129.  Her     cardiac markers are negative x1. 2. EKG does not show any acute changes.  It shows an atrial paced     rhythm. 3. Chest x-ray shows findings suggestive of persistent pulmonary     edema, improved aeration of the bilateral lung bases with     persistent bilateral  infrahilar and bibasilar heterogeneous     opacities, possibly atelectasis though underlying infection is not     excluded.  Indeterminate 5 mm nodule overlies the left midlung, not     definitely seen on prior recent exams.  Comparison to more remote     examinations is recommended.  If no comparisons exist, a followup     chest radiograph in 4 to 6 weeks after treatment is recommended to     ensure resolution. 4. X-ray of the elbow shows no acute abnormalities. 5. CT head shows no evidence of acute intracranial abnormality. 6. CT of the C-spine shows no static evidence of acute injuries of the     cervical spine. 7. X-ray of the left wrist shows serpiginous lucency through the mid     aspect of the scaphoid possibly representing a nondisplaced     fracture.  Correlation for anatomic snuff box tenderness is     recommended.  Splinting and followup wrist radiographs in 10 to 14 days is recommended; chondrocalcinosis compatible with CPPD. 1. X-ray of the pelvis shows no evidence of acute bony abnormality,     osteopenia and mild degenerative changes.  ASSESSMENT: 1. Acute respiratory failure. 2. Acute congestive heart failure, type unspecified. 3. Hypothyroidism. 4. Mechanical fall. 5. Nondisplaced left scaphoid fracture. 6. Status post pacemaker for arrhythmia. 7. Manic depressive behavior. 8. Legally blind. 9. Full code.  PLAN:  We will admit the patient to telemetry, cycle her cardiac enzymes.  Will also check a brain natriuretic peptide.  We will provide diuresis with Lasix and check a 2-D echocardiogram to assess LV function.  Will also check a TSH as well as urinalysis.  We will continue her regular medications.  She will placed on a heart-healthy diet.  Further orders will be per the heart failure core orders set.  We will also ask for [facility] for consultation at the request of the family.  The patient is a full code.  Further orders will be per the clinical  course.     Erick Blinks, MD     JM/MEDQ  D:  05/23/2011  T:  05/23/2011  Job:  [id number]  cc:   Florentina Jenny, MD Fax: [phone]  Electronically Signed by Erick Blinks  on 06/09/2011 12:55:58 AM

## 2011-06-10 ENCOUNTER — Inpatient Hospital Stay (HOSPITAL_COMMUNITY): Payer: Medicare Other

## 2011-06-10 LAB — BASIC METABOLIC PANEL
BUN: 13 mg/dL (ref 6–23)
GFR calc non Af Amer: >60 mL/min (ref >60)
Glucose, Bld: 106 mg/dL — ABNORMAL HIGH (ref 70–99)
Potassium: 3.9 mEq/L (ref 3.5–5.1)

## 2011-06-10 LAB — CBC
HCT: 38.9 % (ref 36.0–46.0)
Hemoglobin: 13.2 g/dL (ref 12.0–15.0)
MCH: 33.3 pg (ref 26.0–34.0)
MCHC: 33.9 g/dL (ref 30.0–36.0)
MCV: 98.2 fL (ref 78.0–100.0)

## 2011-06-11 LAB — CBC
Platelets: 201 10*3/uL (ref 150–400)
RBC: 3.86 MIL/uL — ABNORMAL LOW (ref 3.87–5.11)
WBC: 7.9 10*3/uL (ref 4.0–10.5)

## 2011-06-12 DIAGNOSIS — I4891 Unspecified atrial fibrillation: Secondary | ICD-10-CM

## 2011-06-12 LAB — CULTURE, BLOOD (ROUTINE X 2)
Culture  Setup Time: 201207231254
Culture: NO GROWTH
Culture: NO GROWTH

## 2011-06-12 LAB — CBC
HCT: 33.9 % — ABNORMAL LOW (ref 36.0–46.0)
Hemoglobin: 11.1 g/dL — ABNORMAL LOW (ref 12.0–15.0)
WBC: 6.6 10*3/uL (ref 4.0–10.5)

## 2011-06-13 NOTE — Discharge Summary (Signed)
  [name], [name]              ACCOUNT NO.:  [id number]  MEDICAL RECORD NO.:  [id number]  LOCATION:  1502                         FACILITY:  [facility]  PHYSICIAN:  Kathlen Mody, MD       DATE OF BIRTH:  October 12, 1928  DATE OF ADMISSION:  05/23/2011 DATE OF DISCHARGE:  05/31/2011                              DISCHARGE SUMMARY   ADDENDUM  This is an 77 year old lady with history of diastolic heart failure, COPD, manic depressive behavior, hypothyroidism, admitted for mechanical fall.  She was worked up for ACS and arrhythmia.  Acute coronary syndrome was negative.  She was also found to have bibasilar opacities. She was treated for community-acquired pneumonia.  She has completed her course of antibiotics.  PT, OT consult was called, recommended SNF placement with rehab.  She is being discharged to SNF rehab, and she will follow up with PCP and get repeat x-rays of the chest in about 4 to 6 weeks to evaluate the 5-mm nodule and also will get a repeat x-ray of the wrist in about 2 weeks to evaluate her scaphoid fracture.  DISCHARGE MEDICATIONS:  She will continue on her, 1. Levothyroxine of 100 mcg 1 tablet p.o. daily. 2. Depakote 500 mg b.i.d. 3. Senna 1 tablet b.i.d. 4. Oxybutynin 2.5 mg 1 tablet b.i.d. 5. Risperidone 1 mg b.i.d. 6. Calcium carbonate 1 tablet t.i.d. 7. Lotrisone 3 applications daily. 8. Melatonin 1 tablet at bedtime. 9. Tylenol 650 mg p.o. q.4 h p.r.n. 10.MiraLax 17 g p.o. daily p.r.n. 11.Ambien 10 mg 1 tablet at bedtime p.r.n. 12.Guaifenesin and dextromethorphan 5 mL q. 8 h p.r.n.          ______________________________ Kathlen Mody, MD     VA/MEDQ  D:  05/31/2011  T:  05/31/2011  Job:  [id number]  Electronically Signed by Kathlen Mody MD on 06/13/2011 08:43:38 AM

## 2011-06-14 LAB — BASIC METABOLIC PANEL
BUN: 27 mg/dL — ABNORMAL HIGH (ref 6–23)
Chloride: 105 mEq/L (ref 96–112)
Glucose, Bld: 78 mg/dL (ref 70–99)
Potassium: 4.3 mEq/L (ref 3.5–5.1)

## 2011-06-17 NOTE — Discharge Summary (Signed)
[name], [name]              ACCOUNT NO.:  [id number]  MEDICAL RECORD NO.:  [id number]  LOCATION:  2019                         FACILITY:  [facility]  PHYSICIAN:  Calvert Cantor, M.D.     DATE OF BIRTH:  01/24/1928  DATE OF ADMISSION:  06/06/2011 DATE OF DISCHARGE:                              DISCHARGE SUMMARY   DATE OF DISCHARGE:  Most likely will be within 1-2 days.  PRIMARY CARE PHYSICIAN:  Dr. Florentina Jenny.  PRIMARY CARDIOLOGIST:  Verne Carrow, MD.  PRESENTING COMPLAINT:  Chest pain.  CURRENT DIAGNOSES: 1. Chest pain thought to be pleuritic in nature secondary to     pneumonia. 2. Healthcare-acquired pneumonia versus aspiration pneumonia. 3. Dysphagia with aspiration.  The patient currently is on nectar     thick liquids and she is still at high risk for aspiration despite     the thickened liquids. 4. Short-lived episode of atrial fibrillation on the morning of June 12, 2011. 5. Dementia with occasional behavioral disturbances during hospital     stay. 6. Legally blind. 7. Chronic obstructive pulmonary disease which has been stable. 8. Bipolar disorder which has also been stable. 9. Hypertension. 10.Hypothyroidism. 11.Pacemaker placed in July 2011.  MEDICATIONS:  Will be dictated on the date of discharge.  CONSULTS:  Cardiology consult is requested.  The patient evaluated by Dr. Daleen Squibb on June 12, 2011.  PERTINENT PROCEDURES:  Chest x-ray two-view on June 06, 2011, revealed interval increased left lung base consolidation, atelectasis versus pneumonia.  Likely a small associated pleural effusions as well.  CT angiogram of the chest performed on June 06, 2011, was negative for PE or dissection.  There was a moderate hiatal hernia.  There was patchy airspace infiltrate in the superior segment of the left lower lobe, possibly pneumonia.  Chest x-ray two-view on June 08, 2011, revealed increased left basilar air space disease with a small left effusion.   New patchy airspace opacity in the right base which could be due to progressive pneumonia or atelectasis.  Cardiomegaly.  Hiatal hernia.  Swallow eval performed on June 08, 2011, revealed the patient was aspirated on thick liquids.  Summary states although thin and nectar consistencies both flash penetrated, nectar thick is recommended due to increased viscosity and cohesiveness, aspiration risk is moderate.  Chest x-ray portable one-view June 10, 2011, revealed no significant change in patchy bilateral air space disease and a possible small left effusion.  HOSPITAL COURSE: 1. Chest pain and pneumonia.  This is an 77 year old female who was     discharged on May 31, 2011, after a mechanical fall, nondisplaced     left scaphoid fracture, acute respiratory failure secondary to     diastolic heart failure and pneumonia.  She was discharged on     Avelox.  The patient returned on June 06, 2011, with left-sided chest pain under her breast which hurt every time she took a deep breath.  She was coughing as well.  It was suspected that the patient had a pneumonia as she had already been treated with Avelox.  She was this time started on broader spectrum antibiotics which included vancomycin and cefepime.  It was  noted that her infiltrates worsened despite this regimen and it was suspected that she was aspirating.  Swallow eval and modified barium swallow did prove this.  We placed her on nectar-thick liquids.  The patient was switched over from vancomycin and cefepime to Augmentin 875 p.o. q. 12 on June 09, 2011, over the past 4 days she has not been noted to have any fevers.  Her WBC count has continued to improve and respiratory status has been stable.  At this point, she is on day 8 of Augmentin.  The patient was noted to go into rapid atrial fibrillation on the morning of the 29th.  Her pacer did pacer for about 5 seconds and then she converted to a sinus rhythm.  She was in atrial  fibrillation for may be 2 hours.  I did request a Cardiology eval.  I was uncertain if whether she had a history of AFib as her records did not mention this. According to the dictated note, the patient will have a pacemaker interrogation today to assess the duration number of episodes of AFib that she may have had prior to admission.  Diltiazem has been started at 30 mg p.o. q. 6.  On this, her blood pressures are ranging from 80-90 systolic since last night.  We will continue to monitor her blood pressures before converting her over to long-acting Cardizem.  The patient was also noted to have mild left-sided conjunctivitis with hyperemia of her conjunctiva and increased discharge.  She was started on Patanol drops for a few days and once her signs and symptoms resolved Patanol was discontinued.  The patient continues to be confused.  On the second day of admission, her son called and told the evening staff that the patient does well on Ativan during the hospital stay.  At this point, Ativan was started, however, her confusion was noted to increase.  We did try to switch her over to Haldol, however, the following evening the patient became increasingly sedated and Ativan and Haldol were both discontinued. Since then, she has been confused but not severely agitated, she has required a sitter continuously.  Her COPD has remained stable.  PHYSICAL EXAM:  VITALS:  Temperature 97.9, pulse 60, respirations 16, blood pressure 91/53, pulse ox 93% on room air.  The patient's heart rate is normal sinus rhythm. PSYCHOLOGIC:  She is awake, alert, but disoriented to time and place and situation. HEENT:  Pupils equal, round, reactive to light.  Extraocular movements are intact.  Conjunctiva pink.  Oral mucosa moist. HEART:  Regular rate and rhythm.  No murmurs. LUNGS:  Decreased breath sounds bilaterally.  She does not follow commands and has a poor respiratory effort, but is not  tachypneic. ABDOMEN:  Soft, nontender, nondistended.  Bowel sounds positive. EXTREMITIES:  Mild edema bilaterally.  An addendum to this dictation will be done on the day of discharge.  Time on patient's care, 50 minutes.     Calvert Cantor, M.D.     SR/MEDQ  D:  06/13/2011  T:  06/13/2011  Job:  [id number]  cc:   Florentina Jenny, MD Verne Carrow, MD  Electronically Signed by Calvert Cantor M.D. on 06/17/2011 08:40:34 AM

## 2011-06-29 NOTE — Consult Note (Signed)
[name], [name]              ACCOUNT NO.:  [id number]  MEDICAL RECORD NO.:  [id number]  LOCATION:  2019                         FACILITY:  [facility]  PHYSICIAN:  Jesse Sans. Giordan Fordham, MD, FACCDATE OF BIRTH:  09-Apr-1928  DATE OF CONSULTATION:  06/12/2011 DATE OF DISCHARGE:                                CONSULTATION   PRIMARY CARE PHYSICIAN:  Florentina Jenny, MD  PRIMARY CARDIOLOGIST:  Verne Carrow, MD, who saw her in the hospital on May 23, 2011.  CHIEF COMPLAINT:  PAF/RVR.  HISTORY OF PRESENT ILLNESS:  [name] is an 77 year old female with a history of a permanent pacemaker and preserved EF, but no other known cardiac history.  She was admitted on May 23, 2011, through May 31, 2011, after a fall when she was seen by Cardiology for CHF.  Her EF at time was normal.  When her condition improved, she was discharged to a skilled nursing facility at [facility].  She was transferred from the facility to [facility] on June 06, 2011, for chest pain.  The chest pain was prolonged and ongoing.  Her cardiac enzymes were negative for MI.  She was diagnosed with pneumonia and as they treated the pneumonia, her chest pain improved.  She was close to ready for discharge, but this a.m. at approximately 5:30 she had onset of AFib/RVR that lasted about an hour and half.  She was not given any medications.  Before and after this event, she was in sinus rhythm.  Upon review of telemetry, she had AFib/RVR, which then stopped and she paced for approximately 5 seconds before she came back into sinus rhythm.  Currently, she is in sinus rhythm.  Cardiology was asked to evaluate her.  Ms. Manninen has problems with confusion and manic depressive disorder as well as bipolar disorder and possibly some dementia.  She is unable to give prolonged answers to questions, but is able to answer some simple questions and can answer yes or no questions.  She denies any awareness of an irregular heart rate and  denies chest pain today.  She denies shortness of breath.  She cannot say whether she has ever had atrial fibrillation before.  PAST MEDICAL HISTORY: 1. Admission from May 23, 2011, through May 31, 2011, after a fall     with acute diastolic CHF as well. 2. Hypothyroidism. 3. Nondisplaced left scaphoid fracture. 4. Unknown arrhythmia, status post permanent pacemaker, but no further     information available. 5. Pneumonia. 6. COPD. 7. Manic depressive behavior. 8. The patient is legally blind. 9. Hypertension.  SURGICAL HISTORY:  Permanent pacemaker.  ALLERGIES:  No known drug allergies.  CURRENT MEDICATIONS: 1. Augmentin q.12 h. 2. Os-Cal t.i.d. 3. Lotrisone t.i.d. 4. Depakote 500 mg b.i.d. 5. DVT Lovenox. 6. Ensure t.i.d. 7. Synthroid 100 mcg daily. 8. Patanol eye drops t.i.d. 9. Ditropan 2.5 mg b.i.d. 10.Risperdal 1 mg b.i.d. 11.Senokot b.i.d. 12.Tylenol p.r.n.  SOCIAL HISTORY:  She lives in [place] [facility] alone.  Her son's name is Gifford Shave and his phone number is [phone].  She is retired.  She has approximately 60-pack-year history of tobacco use, but has quit. She has no history of alcohol or drug abuse.  FAMILY HISTORY:  The patient is not able to tell us much about her family, but she denies that they had coronary artery disease.  REVIEW OF SYSTEMS:  She is not able to tell us much about her review of systems, but per physical therapy notes she is very weak.  She reaches out for help, but cannot coherently describe what help she needs.  We were not able to elicit any positive response to questions about pain in various parts of her body.  She is not in acute respiratory distress. She is not febrile.  Full 14-point review of systems is otherwise negative except as stated in the HPI.  PHYSICAL EXAMINATION:  VITAL SIGNS:  Temperature is 95.4, heart rate earlier 140 and now 80, respiratory rate 24, blood pressure 109/63, and O2 saturation 90% on room  air. GENERAL:  She is a frail elderly white female who reaches out with deliberate movements and answer some questions. HEENT:  Normal for age with the exception of extremely poor dentition. NECK:  There is no lymphadenopathy, thyromegaly, bruit, or JVD noted. CARDIOVASCULAR:  Her heart is regular in rate and rhythm with an S1 and S2 and no significant murmur, rub, or gallop is noted.  Distal pulses are intact in all 4 extremities. LUNGS:  She has basilar rales bilaterally. SKIN:  No rashes or lesions are noted. ABDOMEN:  Soft and nontender with active bowel sounds. EXTREMITIES:  There is no cyanosis, clubbing, or edema noted. MUSCULOSKELETAL:  There is no joint deformity or effusions and no spine or CVA tenderness. NEURO:  She is alert and oriented x1 with cranial nerves grossly intact.  Chest x-ray shows cardiomegaly with patchy bilateral airspace disease.  EKG done this morning shows AFib with RVR, rate 141 and no acute ischemic changes.  LABORATORY VALUES:  Hemoglobin 11.1, hematocrit 33.9, WBCs initially 13.7 and now 6.6, and platelets 182.  Sodium 140, potassium 3.9, chloride 100, CO2 of 30, BUN 13, creatinine 0.9, and glucose 106. Cardiac enzymes negative x2.  BNP 202.  IMPRESSION:  [name] was seen by Dr. Daleen Squibb, the patient evaluated and the data reviewed.  We suspect she has a history of paroxysmal atrial fibrillation with tachy-brady syndrome.  The pacemaker may have been implanted for posttermination pauses or bradycardia in response to rate control medications.  We will arrange for interrogation of her device tomorrow to confirm this and to assess the duration and number of episodes of AFib that she had prior to admission.  We will began low-dose Diltiazem at 30 mg q.6 h. which can be changed to 120 mg of Cardizem CD daily if she tolerates it.  She is not an anticoagulation candidate secondary to multiple comorbidities and frailty.     Theodore Demark,  PA-C   ______________________________ Jesse Sans Daleen Squibb, MD, [facility]    RB/MEDQ  D:  06/12/2011  T:  06/13/2011  Job:  [id number]  Electronically Signed by Theodore Demark PA-C on 06/21/2011 06:46:56 AM Electronically Signed by Valera Castle MD FACC on 06/29/2011 10:38:10 AM

## 2011-07-03 ENCOUNTER — Emergency Department (HOSPITAL_COMMUNITY): Payer: Medicare Other

## 2011-07-03 ENCOUNTER — Emergency Department (HOSPITAL_COMMUNITY)
Admission: EM | Admit: 2011-07-03 | Discharge: 2011-07-04 | Disposition: A | Discharge status: Home / Self Care | Payer: Medicare Other | Attending: Emergency Medicine | Admitting: Emergency Medicine

## 2011-07-03 DIAGNOSIS — Z79899 Other long term (current) drug therapy: Secondary | ICD-10-CM | POA: Insufficient documentation

## 2011-07-03 DIAGNOSIS — I10 Essential (primary) hypertension: Secondary | ICD-10-CM | POA: Insufficient documentation

## 2011-07-03 DIAGNOSIS — Y9210 Unspecified residential institution as the place of occurrence of the external cause: Secondary | ICD-10-CM | POA: Insufficient documentation

## 2011-07-03 DIAGNOSIS — J4489 Other specified chronic obstructive pulmonary disease: Secondary | ICD-10-CM | POA: Insufficient documentation

## 2011-07-03 DIAGNOSIS — E039 Hypothyroidism, unspecified: Secondary | ICD-10-CM | POA: Insufficient documentation

## 2011-07-03 DIAGNOSIS — M542 Cervicalgia: Secondary | ICD-10-CM | POA: Insufficient documentation

## 2011-07-03 DIAGNOSIS — M25559 Pain in unspecified hip: Secondary | ICD-10-CM | POA: Insufficient documentation

## 2011-07-03 DIAGNOSIS — Z950 Presence of cardiac pacemaker: Secondary | ICD-10-CM | POA: Insufficient documentation

## 2011-07-03 DIAGNOSIS — W06XXXA Fall from bed, initial encounter: Secondary | ICD-10-CM | POA: Insufficient documentation

## 2011-07-03 DIAGNOSIS — S0180XA Unspecified open wound of other part of head, initial encounter: Secondary | ICD-10-CM | POA: Insufficient documentation

## 2011-07-03 DIAGNOSIS — J449 Chronic obstructive pulmonary disease, unspecified: Secondary | ICD-10-CM | POA: Insufficient documentation

## 2011-07-03 DIAGNOSIS — S0003XA Contusion of scalp, initial encounter: Secondary | ICD-10-CM | POA: Insufficient documentation

## 2011-07-03 DIAGNOSIS — S0083XA Contusion of other part of head, initial encounter: Secondary | ICD-10-CM | POA: Insufficient documentation

## 2011-07-20 NOTE — Discharge Summary (Signed)
  [name], [name]              ACCOUNT NO.:  [id number]  MEDICAL RECORD NO.:  [id number]  LOCATION:  2019                         FACILITY:  [facility]  PHYSICIAN:  Ladell Pier, M.D.   DATE OF BIRTH:  11/18/1927  DATE OF ADMISSION:  06/05/2011 DATE OF DISCHARGE:                              DISCHARGE SUMMARY   ADDENDUM The patient will be discharged back to the skilled nursing facility. Please see her detailed discharge summary.  Discharge diet is dysphagia- one nectar-thick with thickened liquids.  DISCHARGE MEDICATIONS: 1. Amoxicillin 875 mg q.12h. x2 days. 2. Diltiazem 120 mg daily. 3. Cepastat lozenges 1 lozenge every 2 hours. 4. Archivist as needed. 5. Ambien 10 mg at bedtime as needed. 6. Divalproex 500 mg twice daily. 7. Guaifenesin/dextromethorphan 5 mL every 8 hours as needed. 8. Levothyroxine 100 mcg daily. 9. Clotrimazole/betamethasone 1-0.5% under the breast 3 times a day. 10.Melatonin 1 tablet at bedtime. 11.MiraLAX 17 g daily as needed. 12.Os-Cal 1 tablet 3 times daily. 13.Oxybutynin 5 mg half a tablet twice daily. 14.Risperdal 1 mg 1 tablet twice daily. 15.Senna 8.6 mg twice daily. 16.Tylenol 325 mg 2 tablets every 4 hours as needed.  FOLLOWUP APPOINTMENTS:  The patient is being discharged back to the skilled nursing facility.  At the time of discharge, the patient is stable.  For the details of the hospital course, please see the note done by Dr. Butler Denmark.  Labs at the time of discharge, sodium 143, potassium 4.3, chloride 105, CO2 of 30, glucose 78, BUN 27, and creatinine 0.69.  Blood cultures negative x2.  WBC 6.6, hemoglobin 11.1, MCV 99.4, and platelet of 182,000.     Ladell Pier, M.D.     [place]/MEDQ  D:  06/14/2011  T:  06/14/2011  Job:  [id number]  Electronically Signed by Ladell Pier M.D. on 07/20/2011 09:54:09 PM

## 2011-09-15 ENCOUNTER — Other Ambulatory Visit (HOSPITAL_COMMUNITY): Payer: Self-pay | Admitting: Internal Medicine

## 2011-09-23 ENCOUNTER — Encounter (HOSPITAL_COMMUNITY): Payer: Self-pay

## 2011-09-27 ENCOUNTER — Ambulatory Visit (HOSPITAL_COMMUNITY)
Admission: RE | Admit: 2011-09-27 | Discharge: 2011-09-27 | Disposition: A | Discharge status: Home / Self Care | Payer: Medicare Other | Source: Ambulatory Visit | Attending: Internal Medicine | Admitting: Internal Medicine

## 2011-09-27 DIAGNOSIS — R1310 Dysphagia, unspecified: Secondary | ICD-10-CM | POA: Insufficient documentation

## 2011-09-27 NOTE — Procedures (Signed)
Modified Barium Swallow Procedure Note Patient Details  Name: Zakhia Seres MRN: [id number] Date of Birth: Feb 28, 1928  Today's Date: 09/27/2011  Time: 6578-4696    Past Medical History:  Past Medical History  Diagnosis Date  . COPD (chronic obstructive pulmonary disease)   . Hypertension   . Blind   . Hypothyroid   . Pacemaker   . Renal insufficiency   . Finger fracture    Past Surgical History:  Past Surgical History  Procedure Date  . Insert / replace / remove pacemaker     Recommendation/Prognosis  Clinical Impression  Pt presents with mild oropharyngeal dysphagia without aspiration or penetration of any consistency tested.  Of note, pt coughed during evaluation without asp/penetration.  Due to pt's COPD, suspect episodic aspiration occurs from dis coordination of swallow/respiration.  In addition, pt appears with epiglottis that is anteriorally curved (nearly obliterating vallecular space) allowing boluses to spill over epiglottis prior to reflexive swallow trigger.   Trace pudding stasis noted on posterior side of epiglottis clearing with CUED liquid swallow.  Suspect pt's reported coughing with solids may be due to premature spillage of solids into larynx before the swallow.  Also suspect esophageal component to pt's dysphagia.     Dysphagia Diagnosis: Mild oral phase dysphagia;Mild pharyngeal phase dysphagia Recommendations Recommended Consults: Consider esophageal assessment Solid Consistency: Dysphagia 3 (Mechanical soft) (ground meats) Liquid Consistency: Thin (water preferred with meals secondary to resp issue) Medication Administration: Other (Comment) (crush if not contraindicated and necessary) Supervision: Full supervision/cueing for compensatory strategies Compensations: Slow rate;Small sips/bites;Unable to follow commands to complete (monitor respiratory effort and allow rest breaks prn) Postural Changes and/or Swallow Maneuvers: Seated upright 90 degrees;Upright  30-60 min after meal Oral Care Recommendations: Oral care QID Prognosis Prognosis for Safe Diet Advancement: Guarded Barriers to Reach Goals: Cognitive deficits Barriers/Prognosis Comment: defer to primary SLP Individuals Consulted Consulted and Agree with Results and Recommendations: Patient;Other (Comment) (SNF SLP) Report Sent to : Facility (Comment);Other (comment) ([facility])   SLP Goals    General:  Date of Onset: 06/08/11 Other Pertinent Information: 77 year old resident of SNF presenting for MBS to determine appropriateness for dietary advancement.  PMH + for COPD, recent pna July 2012 and Oct 2012, manic depressive d/o/bipolar disorder, HTN, pacemeaker, hypothyroidism, legal blindess.  Pt reports coughing with food requiring liquids to aid clearance.    Type of Study: Repeat MBS (first MBS completed July 2012) Diet Prior to this Study: Dysphagia 3 (soft);Nectar-thick liquids Respiratory Status: Room air Behavior/Cognition: Alert;Requires cueing;Other (comment) (delayed responses) Oral Cavity - Dentition: Edentulous (pt to get dentures) Oral Motor / Sensory Function: Impaired motor;Impaired sensory Oral impairment: Other (Comment) (generalized weakness, standing oral secretions) Vision: Impaired for self-feeding (macular degeneration) Patient Positioning: Upright in bed Baseline Vocal Quality: Low vocal intensity Volitional Cough: Strong;Other (Comment) (productive x1 before test, pt reports baseline) Volitional Swallow: Able to elicit Anatomy: Other (Comment) (appearance of epiglottis curved anteriorally ) Ice chips: Not tested  Oral Phase Oral Preparation/Oral Phase Oral Phase: Impaired Oral - Nectar Oral - Nectar Cup: Weak lingual manipulation;Delayed oral transit;Lingual/palatal residue Oral - Thin Oral - Thin Cup: Weak lingual manipulation;Delayed oral transit;Lingual/palatal residue Oral - Solids Oral - Puree: Lingual/palatal residue;Delayed oral transit;Weak  lingual manipulation Oral - Mechanical Soft: Delayed oral transit;Lingual/palatal residue;Weak lingual manipulation Pharyngeal Phase  Pharyngeal Phase Pharyngeal Phase: Impaired Pharyngeal - Nectar Pharyngeal - Nectar Cup: Pharyngeal residue - valleculae;Premature spillage to valleculae;Premature spillage to pyriform;Reduced pharyngeal peristalsis Pharyngeal - Thin Pharyngeal - Thin Cup: Premature spillage  to valleculae;Pharyngeal residue - valleculae;Premature spillage to pyriform;Reduced pharyngeal peristalsis Pharyngeal - Solids Pharyngeal - Puree: Premature spillage to valleculae;Reduced pharyngeal peristalsis;Pharyngeal residue - valleculae Pharyngeal - Mechanical Soft: Reduced pharyngeal peristalsis;Premature spillage to valleculae;Pharyngeal residue - valleculae Pharyngeal Phase - Comment Pharyngeal Comment: Cued dry swallows helps to decr pharyngeal residuals.  Cervical Esophageal Phase  Cervical Esophageal Phase Cervical Esophageal Phase: Impaired Cervical Esophageal Phase - Comment  Cervical Esophageal Comment: Appearance of wide esophagus, ? hiatal hernia with distal stasis of pudding/cracker without pt awareness.  Nutcracker appearance of esophagus that may indicate spasms.  Thin liquids aid clearance of pudding/cracker but with appearance of trace backflow.  Suspect pt may have component of esophageal dysphagia.  Radiologist not present to confirm.     Chales Abrahams 09/27/2011, 2:27 PM

## 2011-11-21 ENCOUNTER — Other Ambulatory Visit: Payer: Self-pay

## 2011-11-21 ENCOUNTER — Emergency Department (HOSPITAL_COMMUNITY)
Admission: EM | Admit: 2011-11-21 | Discharge: 2011-11-21 | Disposition: A | Discharge status: Home / Self Care | Payer: Medicare Other | Attending: Emergency Medicine | Admitting: Emergency Medicine

## 2011-11-21 ENCOUNTER — Encounter (HOSPITAL_COMMUNITY): Payer: Self-pay | Admitting: *Deleted

## 2011-11-21 ENCOUNTER — Emergency Department (HOSPITAL_COMMUNITY): Payer: Medicare Other

## 2011-11-21 DIAGNOSIS — R4182 Altered mental status, unspecified: Secondary | ICD-10-CM | POA: Insufficient documentation

## 2011-11-21 DIAGNOSIS — R5381 Other malaise: Secondary | ICD-10-CM | POA: Insufficient documentation

## 2011-11-21 DIAGNOSIS — R05 Cough: Secondary | ICD-10-CM | POA: Insufficient documentation

## 2011-11-21 DIAGNOSIS — I10 Essential (primary) hypertension: Secondary | ICD-10-CM | POA: Insufficient documentation

## 2011-11-21 DIAGNOSIS — J449 Chronic obstructive pulmonary disease, unspecified: Secondary | ICD-10-CM | POA: Insufficient documentation

## 2011-11-21 DIAGNOSIS — R059 Cough, unspecified: Secondary | ICD-10-CM | POA: Insufficient documentation

## 2011-11-21 DIAGNOSIS — N390 Urinary tract infection, site not specified: Secondary | ICD-10-CM | POA: Insufficient documentation

## 2011-11-21 DIAGNOSIS — E039 Hypothyroidism, unspecified: Secondary | ICD-10-CM | POA: Insufficient documentation

## 2011-11-21 DIAGNOSIS — R5383 Other fatigue: Secondary | ICD-10-CM | POA: Insufficient documentation

## 2011-11-21 DIAGNOSIS — J4489 Other specified chronic obstructive pulmonary disease: Secondary | ICD-10-CM | POA: Insufficient documentation

## 2011-11-21 DIAGNOSIS — Z950 Presence of cardiac pacemaker: Secondary | ICD-10-CM | POA: Insufficient documentation

## 2011-11-21 DIAGNOSIS — R011 Cardiac murmur, unspecified: Secondary | ICD-10-CM | POA: Insufficient documentation

## 2011-11-21 DIAGNOSIS — F0390 Unspecified dementia without behavioral disturbance: Secondary | ICD-10-CM | POA: Insufficient documentation

## 2011-11-21 HISTORY — DX: Unspecified dementia, unspecified severity, without behavioral disturbance, psychotic disturbance, mood disturbance, and anxiety: F03.90

## 2011-11-21 HISTORY — DX: Pneumonia, unspecified organism: J18.9

## 2011-11-21 HISTORY — DX: Unspecified atrial fibrillation: I48.91

## 2011-11-21 HISTORY — DX: Bipolar disorder, unspecified: F31.9

## 2011-11-21 LAB — COMPREHENSIVE METABOLIC PANEL
AST: 31 U/L (ref 0–37)
BUN: 16 mg/dL (ref 6–23)
CO2: 23 mEq/L (ref 19–32)
Chloride: 103 mEq/L (ref 96–112)
Creatinine, Ser: 0.7 mg/dL (ref 0.50–1.10)
GFR calc non Af Amer: 79 mL/min — ABNORMAL LOW (ref >90)
Total Bilirubin: 0.4 mg/dL (ref 0.3–1.2)

## 2011-11-21 LAB — CBC
HCT: 39.1 % (ref 36.0–46.0)
MCH: 34.8 pg — ABNORMAL HIGH (ref 26.0–34.0)
MCV: 101.6 fL — ABNORMAL HIGH (ref 78.0–100.0)
Platelets: 156 10*3/uL (ref 150–400)
RBC: 3.85 MIL/uL — ABNORMAL LOW (ref 3.87–5.11)

## 2011-11-21 LAB — URINALYSIS, ROUTINE W REFLEX MICROSCOPIC
Bilirubin Urine: NEGATIVE
Glucose, UA: NEGATIVE mg/dL
Specific Gravity, Urine: 1.02 (ref 1.005–1.030)
Urobilinogen, UA: 0.2 mg/dL (ref 0.0–1.0)

## 2011-11-21 LAB — LACTIC ACID, PLASMA: Lactic Acid, Venous: 1.4 mmol/L (ref 0.5–2.2)

## 2011-11-21 LAB — URINE MICROSCOPIC-ADD ON

## 2011-11-21 LAB — PROTIME-INR: Prothrombin Time: 13.1 seconds (ref 11.6–15.2)

## 2011-11-21 MED ORDER — CEPHALEXIN 500 MG PO CAPS: 500.0000 mg | ORAL_CAPSULE | Freq: Two times a day (BID) | ORAL | Status: AC

## 2011-11-21 MED ORDER — DEXTROSE 5 % IV SOLN
1.0000 g | INTRAVENOUS | Status: DC
  Administered 2011-11-21: 1 g via INTRAVENOUS
  Filled 2011-11-21: qty 10

## 2011-11-21 MED ORDER — SODIUM CHLORIDE 0.9 % IV SOLN: 999.0000 mL | Freq: Once | INTRAVENOUS | Status: DC

## 2011-11-21 NOTE — ED Notes (Signed)
Pt is unable to answer any questions appropriately. Pt is leaning to the right. Pt moves all extremities but very slowly, generalized weakness throughout. Pt appears to have had aspirated. Pt with hx of dysphagia and nectar thick liquids. Staff reports pts baseline is flat affect, alert and oriented x 3, ambulatory with rolling waler with slow and steady gait.

## 2011-11-21 NOTE — ED Notes (Signed)
Pt assisted to get dressed. Pt is able to tell me her name birthday and who he is at the bedside. Pt is more alert. Discharge instructions went over with son.

## 2011-11-21 NOTE — ED Notes (Signed)
Pt is more verbal at this time. Son at bedside.

## 2011-11-21 NOTE — ED Provider Notes (Signed)
History     CSN: [id number]  Arrival date & time 11/21/11  1304   First MD Initiated Contact with Patient 11/21/11 1310      Chief Complaint  Patient presents with  . Altered Mental Status  . Weakness  . Cough    (Consider location/radiation/quality/duration/timing/severity/associated sxs/prior treatment) HPI This patient with dementia, bipolar disease presents from her nursing home with reports of changes in mental status. The patient denies focal complaints on exam but cannot provide salient details of her health status, or clinical condition. The patient's nursing home papers suggest that there was a change in mental status, though it is not stated when this occurred. No report of fever, cough, diarrhea. The patient's papers to note that she has dysphagia and is restricted to nectar thick liquids.  Past Medical History  Diagnosis Date  . COPD (chronic obstructive pulmonary disease)   . Hypertension   . Blind   . Hypothyroid   . Pacemaker   . Renal insufficiency   . Finger fracture     Past Surgical History  Procedure Date  . Insert / replace / remove pacemaker     No family history on file.  History  Substance Use Topics  . Smoking status: Never Smoker   . Smokeless tobacco: Not on file  . Alcohol Use:     OB History    Grav Para Term Preterm Abortions TAB SAB Ect Mult Living                  Review of Systems  Unable to perform ROS: Dementia    Allergies  Review of patient's allergies indicates not on file.  Home Medications  No current outpatient prescriptions on file.  BP 144/92  Pulse 89  Temp(Src) 99.6 F (37.6 C) (Oral)  Resp 21  SpO2 95%  Physical Exam  Constitutional: She appears listless. She is cooperative.  HENT:  Head: Normocephalic and atraumatic.  Eyes: Pupils are equal, round, and reactive to light.  Cardiovascular: Normal rate.   Murmur heard. Pulmonary/Chest: Effort normal. No stridor. She has decreased breath sounds.    Abdominal: She exhibits no distension.  Musculoskeletal: She exhibits no edema.  Neurological: She appears listless. No cranial nerve deficit. She exhibits abnormal muscle tone.       Decreased voluntary strength in all extremities  Skin: Skin is warm and dry.  Psychiatric: Her speech is not slurred. She is slowed.    ED Course  Procedures (including critical care time)   Labs Reviewed  COMPREHENSIVE METABOLIC PANEL  LACTIC ACID, PLASMA  PROTIME-INR  URINALYSIS, ROUTINE W REFLEX MICROSCOPIC  CBC   No results found.   No diagnosis found.  Pulse ox 97% RA- normal Cardiac monitor 81 SR, normal   Date: 11/21/2011  Rate: 86  Rhythm: normal sinus rhythm  QRS Axis: normal  Intervals: normal  ST/T Wave abnormalities: normal Q waves anteriorly  Conduction Disutrbances:none  Narrative Interpretation:   Old EKG Reviewed: changes noted ABNORMAL ECG  CT, CXR reviewed by me: no acute findings   MDM  78 year old female presents from her nursing home with reports of altered mental status. On exam the patient is listless though she has stable vital signs. The patient's labs are notable for evidence of a urinary tract infection. Given this finding the patient will be discharged to her nursing home, following initial provision of one dose of her medication.        Gerhard Munch, MD 11/21/11 [phone]

## 2011-11-21 NOTE — ED Notes (Signed)
Pt remains alert but when asked questions pt is not able to appropriately respond. Pt with stuttering and incomprehensible answers. Pt remains to follow commands. Remains on monitor.

## 2011-11-21 NOTE — ED Notes (Signed)
Pt placed on monitor with continuous blood pressure and pulse oximetry 

## 2011-11-21 NOTE — ED Notes (Signed)
Received pt from EMS. Pt placed on monitor.

## 2011-11-21 NOTE — ED Notes (Signed)
Pt is from [facility]. EMS called out for acute change in level of conscious. Staff reported pt with slurred speech and leaning over to the right. Pt with diminished lung sounds throughout. 22g(L)AC started. 99% on 2L. CBG 107.  Pt with "wet cough." reports nursing staff was not very knowledgeable on pt.

## 2012-03-12 IMAGING — CR DG CHEST 2V
1 series · 1 of 1 positions shown · non-contrast
Comparison: Plain films and CT of the chest 06/06/2011.

CLINICAL DATA: Confusion.  Pneumonia.

CHEST - 2 VIEW

[w chest lat]
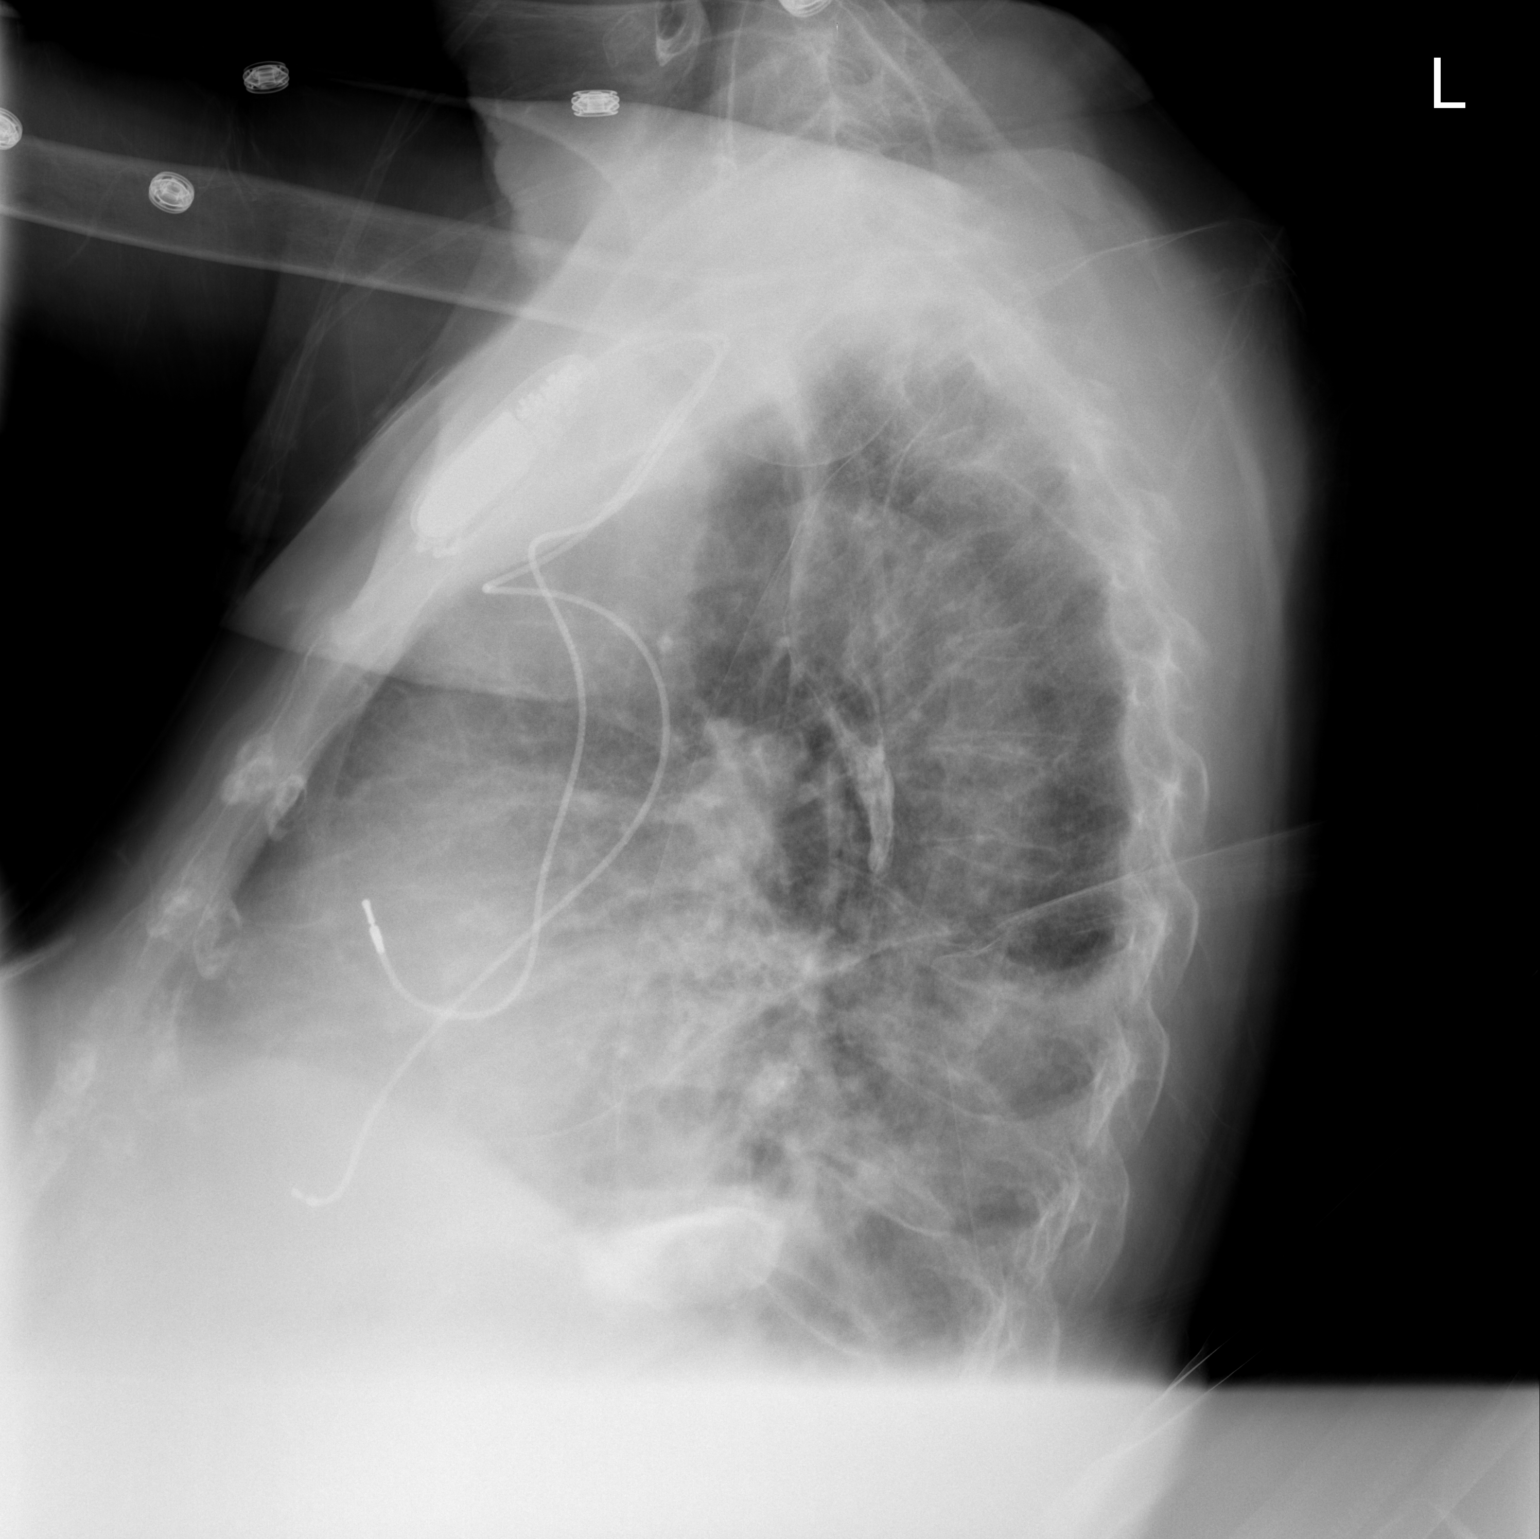

[1 of 1 positions shown; findings below may reference images not displayed]

FINDINGS: Left basilar airspace disease persists without marked
change.  There is some new right basilar airspace opacity.  Left
effusion is noted.  Cardiomegaly.  Lungs are emphysematous. Hiatal
hernia is noted.
IMPRESSION: 1.  Increased left basilar airspace disease with a small left
effusion.
2.  New patchy airspace opacity in the right base could be due to
progressive pneumonia or atelectasis.
3.  Cardiomegaly.
4.  Hiatal hernia.

## 2012-08-15 ENCOUNTER — Encounter (HOSPITAL_COMMUNITY): Payer: Self-pay | Admitting: Emergency Medicine

## 2012-08-15 ENCOUNTER — Emergency Department (HOSPITAL_COMMUNITY): Payer: Medicare Other

## 2012-08-15 ENCOUNTER — Observation Stay (HOSPITAL_COMMUNITY)
Admission: EM | Admit: 2012-08-15 | Discharge: 2012-08-17 | Disposition: A | Discharge status: Skilled Nursing Facility | Payer: Medicare Other | Attending: Internal Medicine | Admitting: Internal Medicine

## 2012-08-15 DIAGNOSIS — M47812 Spondylosis without myelopathy or radiculopathy, cervical region: Secondary | ICD-10-CM | POA: Insufficient documentation

## 2012-08-15 DIAGNOSIS — F0391 Unspecified dementia with behavioral disturbance: Secondary | ICD-10-CM | POA: Diagnosis present

## 2012-08-15 DIAGNOSIS — R1310 Dysphagia, unspecified: Secondary | ICD-10-CM | POA: Insufficient documentation

## 2012-08-15 DIAGNOSIS — J449 Chronic obstructive pulmonary disease, unspecified: Secondary | ICD-10-CM | POA: Insufficient documentation

## 2012-08-15 DIAGNOSIS — J4489 Other specified chronic obstructive pulmonary disease: Secondary | ICD-10-CM | POA: Insufficient documentation

## 2012-08-15 DIAGNOSIS — S42013A Anterior displaced fracture of sternal end of unspecified clavicle, initial encounter for closed fracture: Secondary | ICD-10-CM | POA: Insufficient documentation

## 2012-08-15 DIAGNOSIS — E039 Hypothyroidism, unspecified: Secondary | ICD-10-CM | POA: Insufficient documentation

## 2012-08-15 DIAGNOSIS — IMO0002 Reserved for concepts with insufficient information to code with codable children: Principal | ICD-10-CM | POA: Insufficient documentation

## 2012-08-15 DIAGNOSIS — Z950 Presence of cardiac pacemaker: Secondary | ICD-10-CM | POA: Insufficient documentation

## 2012-08-15 DIAGNOSIS — Y9210 Unspecified residential institution as the place of occurrence of the external cause: Secondary | ICD-10-CM | POA: Insufficient documentation

## 2012-08-15 DIAGNOSIS — I10 Essential (primary) hypertension: Secondary | ICD-10-CM | POA: Insufficient documentation

## 2012-08-15 DIAGNOSIS — M25559 Pain in unspecified hip: Secondary | ICD-10-CM | POA: Insufficient documentation

## 2012-08-15 DIAGNOSIS — W19XXXA Unspecified fall, initial encounter: Secondary | ICD-10-CM | POA: Insufficient documentation

## 2012-08-15 DIAGNOSIS — I629 Nontraumatic intracranial hemorrhage, unspecified: Secondary | ICD-10-CM

## 2012-08-15 DIAGNOSIS — F0390 Unspecified dementia without behavioral disturbance: Secondary | ICD-10-CM | POA: Insufficient documentation

## 2012-08-15 NOTE — ED Notes (Signed)
[id number]<AV> Expected date:<BR> Expected time:<BR> Means of arrival:<BR> Comments:<BR> LSB/fall

## 2012-08-15 NOTE — ED Notes (Signed)
Per EMS: RN heard "big boom" and found pt laying on her right side with a right sided laceration to her forehead. C/o right shoulder pain. Facility RN reported right hip pain.

## 2012-08-15 NOTE — ED Notes (Signed)
Pt fell at [facility], c/o right clavicle pain and tenderness. Hips tied onto LSB due to hip tenderness. Pt has no c/o tenderness on palpation with ED staff. Patient has no other complaints besides right clavicle- Small puncture wound to right forehead. Pt has hx of dementia- nursing home reports pt is at baseline.

## 2012-08-16 ENCOUNTER — Observation Stay (HOSPITAL_COMMUNITY): Payer: Medicare Other

## 2012-08-16 DIAGNOSIS — I629 Nontraumatic intracranial hemorrhage, unspecified: Secondary | ICD-10-CM | POA: Diagnosis present

## 2012-08-16 DIAGNOSIS — F0391 Unspecified dementia with behavioral disturbance: Secondary | ICD-10-CM | POA: Diagnosis present

## 2012-08-16 DIAGNOSIS — W19XXXA Unspecified fall, initial encounter: Secondary | ICD-10-CM

## 2012-08-16 DIAGNOSIS — F0390 Unspecified dementia without behavioral disturbance: Secondary | ICD-10-CM

## 2012-08-16 DIAGNOSIS — E039 Hypothyroidism, unspecified: Secondary | ICD-10-CM | POA: Diagnosis present

## 2012-08-16 DIAGNOSIS — Z950 Presence of cardiac pacemaker: Secondary | ICD-10-CM | POA: Diagnosis present

## 2012-08-16 LAB — COMPREHENSIVE METABOLIC PANEL
Albumin: 2.9 g/dL — ABNORMAL LOW (ref 3.5–5.2)
BUN: 24 mg/dL — ABNORMAL HIGH (ref 6–23)
Calcium: 9 mg/dL (ref 8.4–10.5)
Creatinine, Ser: 0.88 mg/dL (ref 0.50–1.10)
Total Bilirubin: 0.2 mg/dL — ABNORMAL LOW (ref 0.3–1.2)
Total Protein: 6.7 g/dL (ref 6.0–8.3)

## 2012-08-16 LAB — CBC
HCT: 38.8 % (ref 36.0–46.0)
Hemoglobin: 13.2 g/dL (ref 12.0–15.0)
MCV: 102.1 fL — ABNORMAL HIGH (ref 78.0–100.0)
Platelets: 147 10*3/uL — ABNORMAL LOW (ref 150–400)
RBC: 3.8 MIL/uL — ABNORMAL LOW (ref 3.87–5.11)
WBC: 10.4 10*3/uL (ref 4.0–10.5)

## 2012-08-16 LAB — VALPROIC ACID LEVEL: Valproic Acid Lvl: 57.2 ug/mL (ref 50.0–100.0)

## 2012-08-16 MED ORDER — SODIUM CHLORIDE 0.9 % IJ SOLN: 3.0000 mL | INTRAMUSCULAR | Status: DC | PRN

## 2012-08-16 MED ORDER — SODIUM CHLORIDE 0.9 % IV SOLN
250.0000 mL | INTRAVENOUS | Status: DC | PRN
Start: 2012-08-16 — End: 2012-08-17

## 2012-08-16 MED ORDER — OCUVITE PO TABS
1.0000 | ORAL_TABLET | Freq: Every day | ORAL | Status: DC
  Administered 2012-08-17: 1 via ORAL
  Filled 2012-08-16 (×2): qty 1

## 2012-08-16 MED ORDER — SODIUM CHLORIDE 0.9 % IJ SOLN
3.0000 mL | Freq: Two times a day (BID) | INTRAMUSCULAR | Status: DC
  Administered 2012-08-16 – 2012-08-17 (×2): 3 mL via INTRAVENOUS

## 2012-08-16 MED ORDER — ONDANSETRON HCL 4 MG PO TABS: 4.0000 mg | ORAL_TABLET | Freq: Four times a day (QID) | ORAL | Status: DC | PRN

## 2012-08-16 MED ORDER — DILTIAZEM HCL ER COATED BEADS 120 MG PO CP24
120.0000 mg | ORAL_CAPSULE | Freq: Every day | ORAL | Status: DC
  Administered 2012-08-16 – 2012-08-17 (×2): 120 mg via ORAL
  Filled 2012-08-16 (×2): qty 1

## 2012-08-16 MED ORDER — DIVALPROEX SODIUM ER 500 MG PO TB24
500.0000 mg | ORAL_TABLET | Freq: Two times a day (BID) | ORAL | Status: DC
  Administered 2012-08-16 – 2012-08-17 (×3): 500 mg via ORAL
  Filled 2012-08-16 (×4): qty 1

## 2012-08-16 MED ORDER — LEVOTHYROXINE SODIUM 150 MCG PO TABS
150.0000 ug | ORAL_TABLET | Freq: Every day | ORAL | Status: DC
  Administered 2012-08-16 – 2012-08-17 (×2): 150 ug via ORAL
  Filled 2012-08-16 (×5): qty 1

## 2012-08-16 MED ORDER — SENNA 8.6 MG PO TABS
1.0000 | ORAL_TABLET | Freq: Two times a day (BID) | ORAL | Status: DC
  Administered 2012-08-16 – 2012-08-17 (×3): 8.6 mg via ORAL
  Filled 2012-08-16 (×4): qty 1

## 2012-08-16 MED ORDER — ONDANSETRON HCL 4 MG/2ML IJ SOLN: 4.0000 mg | Freq: Four times a day (QID) | INTRAMUSCULAR | Status: DC | PRN

## 2012-08-16 MED ORDER — ACETAMINOPHEN 325 MG PO TABS
650.0000 mg | ORAL_TABLET | ORAL | Status: DC | PRN
Start: 2012-08-16 — End: 2012-08-17
  Administered 2012-08-16 – 2012-08-17 (×3): 650 mg via ORAL
  Filled 2012-08-16 (×5): qty 2

## 2012-08-16 MED ORDER — PHENOL 1.4 % MT LIQD: 2.0000 | OROMUCOSAL | Status: DC | PRN

## 2012-08-16 MED ORDER — RISPERIDONE 1 MG PO TABS
1.0000 mg | ORAL_TABLET | Freq: Every evening | ORAL | Status: DC
  Administered 2012-08-16 – 2012-08-17 (×2): 1 mg via ORAL
  Filled 2012-08-16 (×2): qty 1

## 2012-08-16 MED ORDER — RESOURCE THICKENUP CLEAR PO POWD
6.0000 g | ORAL | Status: DC | PRN
  Filled 2012-08-16 (×2): qty 125

## 2012-08-16 MED ORDER — DILTIAZEM HCL 60 MG PO TABS: 120.0000 mg | ORAL_TABLET | Freq: Every day | ORAL | Status: DC

## 2012-08-16 MED ORDER — RISPERIDONE 0.5 MG PO TABS
0.5000 mg | ORAL_TABLET | Freq: Every morning | ORAL | Status: DC
  Administered 2012-08-16 – 2012-08-17 (×2): 0.5 mg via ORAL
  Filled 2012-08-16 (×2): qty 1

## 2012-08-16 NOTE — Progress Notes (Signed)
Utilization review completed.  

## 2012-08-16 NOTE — Progress Notes (Signed)
Patient seen and admitted by my associate earlier this AM.  I agree with Dr. Irwin Brakeman evaluation and please refer to his H & P for further details.  Work up is currently pending.  Will go ahead and place patient on fall precautions and order a physical therapy evaluation.  Pending results of imaging study will make further recommendations regarding disposition.  Lanisa Ishler, [employer]

## 2012-08-16 NOTE — Evaluation (Signed)
Clinical/Bedside Swallow Evaluation Patient Details  Name: [name] MRN: [id number] Date of Birth: 1928/02/15  Today's Date: 08/16/2012 Time: 0865-7846 SLP Time Calculation (min): 10 min  Past Medical History:  Past Medical History  Diagnosis Date  . COPD (chronic obstructive pulmonary disease)   . Hypertension   . Blind   . Hypothyroid   . Pacemaker   . Renal insufficiency   . Finger fracture   . Dementia   . Bipolar disorder   . Dysphagia     needs thick nectar liquids  . Atrial fibrillation   . Pneumonia    Past Surgical History:  Past Surgical History  Procedure Date  . Insert / replace / remove pacemaker    HPI:  [name] is an 78 y.o. female  With hx of dementia, NH resident, COPD, hypothyoidism, HTN, s/p PPM, bipolar disorder on Depakote, Atrial fib on no anticoagulation, fell tonight and hit her right forehead.  There was a puncture wound there.  She was found to have a very small parenchymal bleed by CT at the right parietal area.  Neurosurgery Dr Wynetta Emery was consulted by the ED and recommended a repeat CT in a. bout 8 hours. Hospitalist was asked to admit her for small ICB. Xray of her clavical showed a comminuted Fx of the distal clavicle.  CT Neck and X ray hip showed no FxPt with a history of dysphagia. chart indicates pt has been on nectar thick liquids. MBS in 2012 shows no aspiration but indicates coughing without aspiraiton. Also indicates pt at risk of intermittent penetration due to respiraitory status and curved epiglottis. Pt does have a history of recurrent pna.    Assessment / Plan / Recommendation Clinical Impression  Pt with a history of mild oropharyngeal dysphagia with two MBS last year. Both show flash penetration of thin and nectar thick liquids though nectar thick liquids were more cohesive and presented with decreased risk. Today pts function appears essentially unchanged with multiple swallow and slight throat clear, as seen in prior  observations though it has often been independent of aspriation. Given pts fall and possible AMS/weakness recommend a dys 3 (mechanical soft ) diet with necat thick liquids until pt demosntrates improved endurance. Prognosis for upgrade to thin during hospital stay very good.     Aspiration Risk  Mild    Diet Recommendation Dysphagia 3 (Mechanical Soft);Nectar-thick liquid   Liquid Administration via: Cup Medication Administration: Whole meds with puree Supervision: Patient able to self feed;Intermittent supervision to cue for compensatory strategies Compensations: Slow rate;Small sips/bites;Multiple dry swallows after each bite/sip;Follow solids with liquid Postural Changes and/or Swallow Maneuvers: Seated upright 90 degrees;Upright 30-60 min after meal    Other  Recommendations Oral Care Recommendations: Oral care BID Other Recommendations: Order thickener from pharmacy   Follow Up Recommendations  Inpatient Rehab    Frequency and Duration min 2x/week  2 weeks   Pertinent Vitals/Pain Na     SLP Swallow Goals Patient will consume recommended diet without observed clinical signs of aspiration with: Minimal assistance Patient will utilize recommended strategies during swallow to increase swallowing safety with: Minimal cueing   Swallow Study Prior Functional Status       General HPI: [name] is an 78 y.o. female  With hx of dementia, NH resident, COPD, hypothyoidism, HTN, s/p PPM, bipolar disorder on Depakote, Atrial fib on no anticoagulation, fell tonight and hit her right forehead.  There was a puncture wound there.  She was found to have a very small  parenchymal bleed by CT at the right parietal area.  Neurosurgery Dr Wynetta Emery was consulted by the ED and recommended a repeat CT in a. bout 8 hours. Hospitalist was asked to admit her for small ICB. Xray of her clavical showed a comminuted Fx of the distal clavicle.  CT Neck and X ray hip showed no FxPt with a history of  dysphagia. chart indicates pt has been on nectar thick liquids. MBS in 2012 shows no aspiration but indicates coughing without aspiraiton. Also indicates pt at risk of intermittent penetration due to respiraitory status and curved epiglottis. Pt does have a history of recurrent pna.  Type of Study: Bedside swallow evaluation Diet Prior to this Study: NPO Respiratory Status: Supplemental O2 delivered via (comment) Behavior/Cognition: Alert;Cooperative;Requires cueing Oral Cavity - Dentition: Edentulous Self-Feeding Abilities: Able to feed self Patient Positioning: Upright in bed Baseline Vocal Quality: Clear Volitional Cough: Strong Volitional Swallow: Able to elicit    Oral/Motor/Sensory Function Overall Oral Motor/Sensory Function: Appears within functional limits for tasks assessed   Ice Chips     Thin Liquid Thin Liquid: Impaired Presentation: Cup Pharyngeal  Phase Impairments: Decreased hyoid-laryngeal movement;Multiple swallows    Nectar Thick Nectar Thick Liquid: Impaired Presentation: Cup Pharyngeal Phase Impairments: Decreased hyoid-laryngeal movement;Throat Clearing - Delayed;Multiple swallows   Honey Thick Honey Thick Liquid: Not tested   Puree Puree: Impaired Pharyngeal Phase Impairments: Decreased hyoid-laryngeal movement;Multiple swallows   Solid   GO Functional Assessment Tool Used: clinical judgement Functional Limitations: Swallowing Swallow Current Status ([id number]): At least 20 percent but less than 40 percent impaired, limited or restricted Swallow Goal Status [phone]): At least 1 percent but less than 20 percent impaired, limited or restricted Swallow Discharge Status [phone]): At least 1 percent but less than 20 percent impaired, limited or restricted  Solid: Impaired Presentation: Self Fed Oral Phase Functional Implications:  (edentulous, slow mastication)      Harlon Ditty, MA CCC-SLP [phone]  Tyrique Sporn, Riley Nearing 08/16/2012,10:01 AM

## 2012-08-16 NOTE — ED Provider Notes (Signed)
History     CSN: [id number]  Arrival date & time 08/15/12  2128   First MD Initiated Contact with Patient 08/15/12 2133      Chief Complaint  Patient presents with  . Fall    (Consider location/radiation/quality/duration/timing/severity/associated sxs/prior treatment) HPI Patient presents emergency department from a nursing home, where she fell, earlier this evening.  Patient was found by the nurse in her room laying on the floor after hearing a loud crash.  Patient is unable to give me any history due to the fact she has dementia.  Patient has an abrasion and small laceration to the right forehead.  Past Medical History  Diagnosis Date  . COPD (chronic obstructive pulmonary disease)   . Hypertension   . Blind   . Hypothyroid   . Pacemaker   . Renal insufficiency   . Finger fracture   . Dementia   . Bipolar disorder   . Dysphagia     needs thick nectar liquids  . Atrial fibrillation   . Pneumonia     Past Surgical History  Procedure Date  . Insert / replace / remove pacemaker     No family history on file.  History  Substance Use Topics  . Smoking status: Never Smoker   . Smokeless tobacco: Not on file  . Alcohol Use:     OB History    Grav Para Term Preterm Abortions TAB SAB Ect Mult Living                  Review of Systems All other systems negative except as documented in the HPI. All pertinent positives and negatives as reviewed in the HPI.  Allergies  Review of patient's allergies indicates no known allergies.  Home Medications   Current Outpatient Rx  Name Route Sig Dispense Refill  . ACETAMINOPHEN 325 MG PO TABS Oral Take 650 mg by mouth every 4 (four) hours as needed. For pain     . ASPIRIN 81 MG PO CHEW Oral Chew 81 mg by mouth daily.    . OCUVITE PO TABS Oral Take 1 tablet by mouth daily.    Marland Kitchen CALCIUM CARBONATE ANTACID 500 MG PO CHEW Oral Chew 1 tablet by mouth 3 (three) times daily.      Marland Kitchen DILTIAZEM HCL 120 MG PO TABS Oral Take 120 mg by  mouth daily.      Marland Kitchen DIVALPROEX SODIUM ER 500 MG PO TB24 Oral Take 500 mg by mouth 2 (two) times daily.      . GUAIFENESIN-DM 100-10 MG/5ML PO SYRP Oral Take 5 mLs by mouth 3 (three) times daily as needed. For cough     . LEVOTHYROXINE SODIUM 150 MCG PO TABS Oral Take 150 mcg by mouth daily.    Marland Kitchen MELATIN PO Oral Take 1.5 tablets by mouth at bedtime.      Marland Kitchen PHENOL 1.4 % MT LIQD Mouth/Throat Use as directed 2 sprays in the mouth or throat as needed. Sore throat    . RISPERIDONE 0.5 MG PO TABS Oral Take 0.5 mg by mouth every morning.    Marland Kitchen RISPERIDONE 1 MG PO TABS Oral Take 1 mg by mouth every evening.     . SENNA 8.6 MG PO TABS Oral Take 1 tablet by mouth 2 (two) times daily.    Marland Kitchen ZOLPIDEM TARTRATE 5 MG PO TABS Oral Take 5 mg by mouth at bedtime as needed. sleep      BP 135/81  Pulse 60  Resp 16  SpO2 96%  Physical Exam  Nursing note and vitals reviewed. Constitutional: She appears well-developed and well-nourished. No distress.  HENT:  Head: Normocephalic. Head is with abrasion, with contusion and with laceration.    Right Ear: Tympanic membrane normal.  Left Ear: Tympanic membrane normal.  Nose: Nose normal.  Mouth/Throat: Uvula is midline, oropharynx is clear and moist and mucous membranes are normal.  Eyes: Pupils are equal, round, and reactive to light.  Neck: Normal range of motion. Neck supple.  Cardiovascular: Normal rate, regular rhythm and normal heart sounds.  Exam reveals no gallop and no friction rub.   No murmur heard. Pulmonary/Chest: Effort normal and breath sounds normal.  Lymphadenopathy:    She has no cervical adenopathy.  Skin: Skin is warm and dry. No rash noted.    ED Course  Procedures (including critical care time)  Labs Reviewed - No data to display Dg Shoulder Right  08/15/2012  *RADIOLOGY REPORT*  Clinical Data: Right shoulder pain  RIGHT SHOULDER - 2+ VIEW  Comparison: None.  Findings: Comminuted fracture of the distal clavicle is noted.  The  coracoclavicular and acromioclavicular distances are preserved. Apex cranial angulation is seen at the clavicular fracture site.  IMPRESSION: Comminuted fracture of the distal clavicle.   Original Report Authenticated By: ERIC A. MANSELL, M.D.    Dg Hip Complete Right  08/15/2012  *RADIOLOGY REPORT*  Clinical Data: Right hip pain  RIGHT HIP - COMPLETE 2+ VIEW  Comparison: None.  Findings: Frontal view the pelvis shows diffuse bony demineralization.  SI joints and symphysis pubis are unremarkable. AP and frog-leg lateral views of the right hip show no evidence for femoral neck fracture.  IMPRESSION: No evidence for right femoral neck fracture.   Original Report Authenticated By: ERIC A. MANSELL, M.D.    Ct Head Wo Contrast  08/15/2012  *RADIOLOGY REPORT*  Clinical Data: Fall, right forehead laceration.  CT HEAD WITHOUT CONTRAST,CT CERVICAL SPINE WITHOUT CONTRAST  Technique:  Contiguous axial images were obtained from the base of the skull through the vertex without contrast.,Technique: Multidetector CT imaging of the cervical spine was performed. Multiplanar CT image reconstructions were also generated.  Comparison: 11/21/2011  Findings:  Head:  0.5 x 0.8 cm focus of hyperattenuation along the right frontal temporal lobe as on series 2 image 16.  No mass effect.  I am uncertain if this is within the parenchyma or extra-axial space, though favor parenchyma along the gray-white junction.  No overt hydrocephalus.  Mild white matter changes.  No definite CT evidence of acute infarction. The visualized paranasal sinuses and mastoid air cells are predominately clear.  No displaced calvarial fracture.  IMPRESSION: Small focus of hyperattenuation within the right  parietal temporal lobe, in keeping with hemorrhage.  This is favored to be a parenchymal contusion. Less likely, subarachnoid blood.  No significant mass effect.  Critical Value/emergent results were called by telephone at the time of interpretation on  08/15/2012 at 10:40 p.m. to Charlestine Night, who verbally acknowledged these results.  Cervical spine:  Biapical scarring.  Maintained craniocervical relationship.  No dens fracture. Diffuse osteopenia.  Multilevel degenerative changes.  Maintained alignment.  No acute fracture. Paravertebral soft tissues within normal limits.  Impression:  Multilevel degenerative changes.  No acute fracture or dislocation of the cervical spine identified.   Original Report Authenticated By: Waneta Martins, M.D.    Ct Cervical Spine Wo Contrast  08/15/2012  *RADIOLOGY REPORT*  Clinical Data: Fall, right forehead laceration.  CT HEAD WITHOUT CONTRAST,CT CERVICAL  SPINE WITHOUT CONTRAST  Technique:  Contiguous axial images were obtained from the base of the skull through the vertex without contrast.,Technique: Multidetector CT imaging of the cervical spine was performed. Multiplanar CT image reconstructions were also generated.  Comparison: 11/21/2011  Findings:  Head:  0.5 x 0.8 cm focus of hyperattenuation along the right frontal temporal lobe as on series 2 image 16.  No mass effect.  I am uncertain if this is within the parenchyma or extra-axial space, though favor parenchyma along the gray-white junction.  No overt hydrocephalus.  Mild white matter changes.  No definite CT evidence of acute infarction. The visualized paranasal sinuses and mastoid air cells are predominately clear.  No displaced calvarial fracture.  IMPRESSION: Small focus of hyperattenuation within the right  parietal temporal lobe, in keeping with hemorrhage.  This is favored to be a parenchymal contusion. Less likely, subarachnoid blood.  No significant mass effect.  Critical Value/emergent results were called by telephone at the time of interpretation on 08/15/2012 at 10:40 p.m. to Charlestine Night, who verbally acknowledged these results.  Cervical spine:  Biapical scarring.  Maintained craniocervical relationship.  No dens fracture. Diffuse  osteopenia.  Multilevel degenerative changes.  Maintained alignment.  No acute fracture. Paravertebral soft tissues within normal limits.  Impression:  Multilevel degenerative changes.  No acute fracture or dislocation of the cervical spine identified.   Original Report Authenticated By: Waneta Martins, M.D.    I spoke with Dr. Wynetta Emery of neurosurgery.  He said to observe the patient or the next 8 hours and repeat a CT scan to evaluate for this area of Possible contusion versus bleeding.  Spoke with the hospitalist, who will admit the patient for this observation period.  1. Intracranial bleed   2. Dementia   3. Fall   4. Hypothyroidism       MDM  MDM Reviewed: nursing note and vitals Interpretation: CT scan and x-ray Consults: admitting MD and neurosurgery            Carlyle Dolly, PA-C 08/16/12 0151

## 2012-08-16 NOTE — ED Notes (Addendum)
Lillia Abed, RN informed Dr. Conley Rolls about chest pain. Lillia Abed, RN informed me he did not want to do cardiac enzymes and did not want to do pain medications. EKG was given previously to Dr. Bernette Mayers via Lillia Abed, RN.

## 2012-08-16 NOTE — H&P (Signed)
[facility] History and Physical  [name] RUE:[id number] DOB: 11/19/1927    PCP:   Bufford Spikes, DO   Chief Complaint: [name] and hit her head.  HPI: [name] is an 78 y.o. female  With hx of dementia, NH resident, COPD, hypothyoidism, HTN, s/p PPM, bipolar disorder on Depakote, Atrial fib on no anticoagulation, fell tonight and hit her right forehead.  There was a puncture wound there.  She was found to have a very small parenchymal bleed by CT at the right parietal area.  Neurosurgery Dr Wynetta Emery was consulted by the ED and recommended a repeat CT in about 8 hours. Hospitalist was asked to admit her for small ICB. Xray of her clavical showed a comminuted Fx of the distal clavicle.  CT Neck and X ray hip showed no Fx.  Rewiew of Systems:  Constitutional: Negative for malaise, fever and chills. No significant weight loss or weight gain Eyes: Negative for eye pain, redness and discharge, diplopia, visual changes, or flashes of light. ENMT: Negative for ear pain, hoarseness, nasal congestion, sinus pressure and sore throat. No headaches; tinnitus, drooling. Cardiovascular: Negative for chest pain, palpitations, diaphoresis, dyspnea and peripheral edema. ; No orthopnea, PND Respiratory: Negative for cough, hemoptysis, wheezing and stridor. No pleuritic chestpain. Gastrointestinal: Negative for nausea, vomiting, diarrhea, constipation, abdominal pain, melena, blood in stool, hematemesis, jaundice and rectal bleeding.    Genitourinary: Negative for frequency, dysuria, incontinence,flank pain and hematuria; Musculoskeletal: Negative for back pain and neck pain. Negative for swelling and trauma.;  Skin: . Negative for pruritus, rash, abrasions, bruising and skin lesion.; ulcerations Neuro: Negative for headache, lightheadedness and neck stiffness. Negative for weakness, altered level of consciousness , altered mental status, extremity weakness, burning feet, involuntary movement, seizure  and syncope.  Psych: negative for anxiety, depression, insomnia, tearfulness, panic attacks, hallucinations, paranoia, suicidal or homicidal ideation    Past Medical History  Diagnosis Date  . COPD (chronic obstructive pulmonary disease)   . Hypertension   . Blind   . Hypothyroid   . Pacemaker   . Renal insufficiency   . Finger fracture   . Dementia   . Bipolar disorder   . Dysphagia     needs thick nectar liquids  . Atrial fibrillation   . Pneumonia     Past Surgical History  Procedure Date  . Insert / replace / remove pacemaker     Medications:  HOME MEDS: Prior to Admission medications   Medication Sig Start Date End Date Taking? Authorizing Provider  acetaminophen (TYLENOL) 325 MG tablet Take 650 mg by mouth every 4 (four) hours as needed. For pain    Yes Historical Provider, MD  aspirin 81 MG chewable tablet Chew 81 mg by mouth daily.   Yes Historical Provider, MD  beta carotene w/minerals (OCUVITE) tablet Take 1 tablet by mouth daily.   Yes Historical Provider, MD  calcium carbonate (TUMS - DOSED IN MG ELEMENTAL CALCIUM) 500 MG chewable tablet Chew 1 tablet by mouth 3 (three) times daily.     Yes Historical Provider, MD  diltiazem (CARDIZEM) 120 MG tablet Take 120 mg by mouth daily.     Yes Historical Provider, MD  divalproex (DEPAKOTE ER) 500 MG 24 hr tablet Take 500 mg by mouth 2 (two) times daily.     Yes Historical Provider, MD  guaiFENesin-dextromethorphan (ROBITUSSIN DM) 100-10 MG/5ML syrup Take 5 mLs by mouth 3 (three) times daily as needed. For cough    Yes Historical Provider, MD  levothyroxine (SYNTHROID, LEVOTHROID)  150 MCG tablet Take 150 mcg by mouth daily.   Yes Historical Provider, MD  Melatonin-Pyridoxine (MELATIN PO) Take 1.5 tablets by mouth at bedtime.     Yes Historical Provider, MD  phenol (CHLORASEPTIC) 1.4 % LIQD Use as directed 2 sprays in the mouth or throat as needed. Sore throat   Yes Historical Provider, MD  risperiDONE (RISPERDAL) 0.5 MG  tablet Take 0.5 mg by mouth every morning.   Yes Historical Provider, MD  risperiDONE (RISPERDAL) 1 MG tablet Take 1 mg by mouth every evening.    Yes Historical Provider, MD  senna (SENOKOT) 8.6 MG TABS Take 1 tablet by mouth 2 (two) times daily.   Yes Historical Provider, MD  zolpidem (AMBIEN) 5 MG tablet Take 5 mg by mouth at bedtime as needed. sleep   Yes Historical Provider, MD     Allergies:  No Known Allergies  Social History:   reports that she has never smoked. She does not have any smokeless tobacco history on file. She reports that she does not use illicit drugs. Her alcohol history not on file.  Family History: No family history on file.   Physical Exam: Filed Vitals:   08/15/12 2145 08/16/12 0009  BP: 124/68 135/81  Pulse: 70 60  Resp:  16  SpO2: 88% 96%   Blood pressure 135/81, pulse 60, resp. rate 16, SpO2 96.00%.  GEN:  Pleasant patient lying in the stretcher in no acute distress; cooperative with exam. PSYCH:  alert and oriented x4; does not appear anxious or depressed; affect is appropriate. HEENT: Mucous membranes pink and anicteric; PERRLA; EOM intact; no cervical lymphadenopathy nor thyromegaly or carotid bruit; no JVD; There were no stridor. Neck is very supple. Breasts:: Not examined CHEST WALL: No tenderness CHEST: Normal respiration, clear to auscultation bilaterally.  HEART: Regular rate and rhythm.  There are no murmur, rub, or gallops.   BACK: No kyphosis or scoliosis; no CVA tenderness ABDOMEN: soft and non-tender; no masses, no organomegaly, normal abdominal bowel sounds; no pannus; no intertriginous candida. There is no rebound and no distention. Rectal Exam: Not done EXTREMITIES: No bone or joint deformity; age-appropriate arthropathy of the hands and knees; no edema; no ulcerations.  There is no calf tenderness. Genitalia: not examined PULSES: 2+ and symmetric SKIN: Normal hydration no rash or ulceration CNS: Cranial nerves 2-12 grossly intact  no focal lateralizing neurologic deficit.  Speech is fluent; uvula elevated with phonation, facial symmetry and tongue midline. DTR are normal bilaterally, cerebella exam is intact, barbinski is negative and strengths are equaled bilaterally.  No sensory loss.   Labs on Admission:  Basic Metabolic Panel: No results found for this basename: NA:5,K:5,CL:5,CO2:5,GLUCOSE:5,BUN:5,CREATININE:5,CALCIUM:5,MG:5,PHOS:5 in the last 168 hours Liver Function Tests: No results found for this basename: AST:5,ALT:5,ALKPHOS:5,BILITOT:5,PROT:5,ALBUMIN:5 in the last 168 hours No results found for this basename: LIPASE:5,AMYLASE:5 in the last 168 hours No results found for this basename: AMMONIA:5 in the last 168 hours CBC: No results found for this basename: WBC:5,NEUTROABS:5,HGB:5,HCT:5,MCV:5,PLT:5 in the last 168 hours Cardiac Enzymes: No results found for this basename: CKTOTAL:5,CKMB:5,CKMBINDEX:5,TROPONINI:5 in the last 168 hours  CBG: No results found for this basename: GLUCAP:5 in the last 168 hours   Radiological Exams on Admission: Dg Shoulder Right  08/15/2012  *RADIOLOGY REPORT*  Clinical Data: Right shoulder pain  RIGHT SHOULDER - 2+ VIEW  Comparison: None.  Findings: Comminuted fracture of the distal clavicle is noted.  The coracoclavicular and acromioclavicular distances are preserved. Apex cranial angulation is seen at the clavicular fracture site.  IMPRESSION: Comminuted fracture of the distal clavicle.   Original Report Authenticated By: ERIC A. MANSELL, M.D.    Dg Hip Complete Right  08/15/2012  *RADIOLOGY REPORT*  Clinical Data: Right hip pain  RIGHT HIP - COMPLETE 2+ VIEW  Comparison: None.  Findings: Frontal view the pelvis shows diffuse bony demineralization.  SI joints and symphysis pubis are unremarkable. AP and frog-leg lateral views of the right hip show no evidence for femoral neck fracture.  IMPRESSION: No evidence for right femoral neck fracture.   Original Report Authenticated By:  ERIC A. MANSELL, M.D.    Ct Head Wo Contrast  08/15/2012  *RADIOLOGY REPORT*  Clinical Data: Fall, right forehead laceration.  CT HEAD WITHOUT CONTRAST,CT CERVICAL SPINE WITHOUT CONTRAST  Technique:  Contiguous axial images were obtained from the base of the skull through the vertex without contrast.,Technique: Multidetector CT imaging of the cervical spine was performed. Multiplanar CT image reconstructions were also generated.  Comparison: 11/21/2011  Findings:  Head:  0.5 x 0.8 cm focus of hyperattenuation along the right frontal temporal lobe as on series 2 image 16.  No mass effect.  I am uncertain if this is within the parenchyma or extra-axial space, though favor parenchyma along the gray-white junction.  No overt hydrocephalus.  Mild white matter changes.  No definite CT evidence of acute infarction. The visualized paranasal sinuses and mastoid air cells are predominately clear.  No displaced calvarial fracture.  IMPRESSION: Small focus of hyperattenuation within the right  parietal temporal lobe, in keeping with hemorrhage.  This is favored to be a parenchymal contusion. Less likely, subarachnoid blood.  No significant mass effect.  Critical Value/emergent results were called by telephone at the time of interpretation on 08/15/2012 at 10:40 p.m. to Charlestine Night, who verbally acknowledged these results.  Cervical spine:  Biapical scarring.  Maintained craniocervical relationship.  No dens fracture. Diffuse osteopenia.  Multilevel degenerative changes.  Maintained alignment.  No acute fracture. Paravertebral soft tissues within normal limits.  Impression:  Multilevel degenerative changes.  No acute fracture or dislocation of the cervical spine identified.   Original Report Authenticated By: Waneta Martins, M.D.    Ct Cervical Spine Wo Contrast  08/15/2012  *RADIOLOGY REPORT*  Clinical Data: Fall, right forehead laceration.  CT HEAD WITHOUT CONTRAST,CT CERVICAL SPINE WITHOUT CONTRAST   Technique:  Contiguous axial images were obtained from the base of the skull through the vertex without contrast.,Technique: Multidetector CT imaging of the cervical spine was performed. Multiplanar CT image reconstructions were also generated.  Comparison: 11/21/2011  Findings:  Head:  0.5 x 0.8 cm focus of hyperattenuation along the right frontal temporal lobe as on series 2 image 16.  No mass effect.  I am uncertain if this is within the parenchyma or extra-axial space, though favor parenchyma along the gray-white junction.  No overt hydrocephalus.  Mild white matter changes.  No definite CT evidence of acute infarction. The visualized paranasal sinuses and mastoid air cells are predominately clear.  No displaced calvarial fracture.  IMPRESSION: Small focus of hyperattenuation within the right  parietal temporal lobe, in keeping with hemorrhage.  This is favored to be a parenchymal contusion. Less likely, subarachnoid blood.  No significant mass effect.  Critical Value/emergent results were called by telephone at the time of interpretation on 08/15/2012 at 10:40 p.m. to Charlestine Night, who verbally acknowledged these results.  Cervical spine:  Biapical scarring.  Maintained craniocervical relationship.  No dens fracture. Diffuse osteopenia.  Multilevel degenerative changes.  Maintained alignment.  No acute fracture. Paravertebral soft tissues within normal limits.  Impression:  Multilevel degenerative changes.  No acute fracture or dislocation of the cervical spine identified.   Original Report Authenticated By: Waneta Martins, M.D.      Assessment/Plan Present on Admission:  .Intracranial bleed .Dementia .Fall .Pacemaker Right clavicular Fx.  PLAN:  I spoke with her son and updated my plans.  Will admit her to [facility] in case neurosurgical intervention is needed.  I have ordered a follow up CT head with no contrast to follow up on the small bleed she had.  Will stop her ASA, and use SCD  for prophylaxis.  Make her NPO pending the follow up CT.  Please give thicken food as she does have some dysphagia.  Also, since she is on Depakote, will obtain VPA.  She is to continue her thyroid supplement, and I will check TSH.  Her right clavicular Fx requires no surgical intervention.  Finally, I spoke with her son and confirm tonight that she is FULL CODE.  Will admit her to telemetry (Hx of Afib), under [facility] service.  Other plans as per orders.  Code Status: FULL Unk Lightning, MD. [facility] Pager [phone] 7pm to 7am.  08/16/2012, 1:44 AM

## 2012-08-16 NOTE — Progress Notes (Signed)
Clinical Social Worker received referral indicating pt is from [facility].  CSW attempted to meet with pt, pt currently appears confused.  CSW left message with [facility] representative.  CSW to continue to follow and assist as needed.   Angelia Mould, MSW, [place] [phone]

## 2012-08-16 NOTE — ED Notes (Signed)
[facility]--BO [phone]

## 2012-08-16 NOTE — ED Notes (Signed)
Pt c/o chest pain EKG performed. 

## 2012-08-17 NOTE — Progress Notes (Signed)
Clinical Social Work Department BRIEF PSYCHOSOCIAL ASSESSMENT 08/17/2012  Patient:  [facility]     Account Number:  [id number]     Admit date:  08/15/2012  Clinical Social Worker:  Read Drivers  Date/Time:  08/17/2012 04:42 PM  Referred by:  RN  Date Referred:  08/17/2012 Referred for  SNF Placement   Other Referral:   none   Interview type:  Patient Other interview type:   none    PSYCHOSOCIAL DATA Living Status:  FACILITY Admitted from facility:  [facility] [place] Level of care:  Skilled Nursing Facility Primary support name:  none Primary support relationship to patient:   Degree of support available:   none    CURRENT CONCERNS Current Concerns  Post-Acute Placement   Other Concerns:   none    SOCIAL WORK ASSESSMENT / PLAN CSW assessed pt at bedside.  Pt was from [facility].  Pt wanted to get in touch with son.  Son has not been at bedside.  Pt did not have son's phone number and no number was on file.  She stated she had it in her pocketbook at SNF.  Pt suggested she will get it when she gets back. Pt is d/c today. Pt support is non-existent.   Assessment/plan status:  Psychosocial Support/Ongoing Assessment of Needs Other assessment/ plan:   none   Information/referral to community resources:   SNF    PATIENT'S/FAMILY'S RESPONSE TO PLAN OF CARE: very appreciative        Vickii Penna, LCSWA [phone]  Clinical Social Work

## 2012-08-17 NOTE — Discharge Summary (Signed)
Physician Discharge Summary  [name] ZOX:[id number] DOB: 1928/09/01 DOA: 08/15/2012  PCP: Bufford Spikes, DO  Admit date: 08/15/2012 Discharge date: 08/17/2012  Recommendations for Outpatient Follow-up:  1. Patient will need physical therapy at home and help with OOB transfers 2. Also follow up with pain control and escalate pain regimen should patient require it 3. Avoid aspirin or any medication that may cause increase bleeding risk  Discharge Diagnoses:  Active Problems:  Dementia  Fall  Pacemaker  Hypothyroidism   Discharge Condition: Stable  Diet recommendation: Dysphagia 3 diet  Filed Weights   08/16/12 0500  Weight: 74.844 kg (165 lb)    History of present illness:  From original HPI: [name] is an 78 y.o. female With hx of dementia, NH resident, COPD, hypothyoidism, HTN, s/p PPM, bipolar disorder on Depakote, Atrial fib on no anticoagulation, fell tonight and hit her right forehead. There was a puncture wound there. She was found to have a very small parenchymal bleed by CT at the right parietal area. Neurosurgery Dr Wynetta Emery was consulted by the ED and recommended a repeat CT in about 8 hours. Hospitalist was asked to admit her for small ICB. Xray of her clavical showed a comminuted Fx of the distal clavicle. CT Neck and X ray hip showed no Fx.   Hospital Course:   Small parenchymal bleed:   -At this point repeat CT of head was negative for intracranial hemorrhage and further mentioned that initial CT of head findings may have been artifact or resolved abnormality -Physical therapy has evaluated patient and mentioned that patient will require physical therapy at residence and help with OOB and transfers - Would continue to hold aspirin at this juncture given possibility of bleed although none seen on f/u CT of head  Dementia - Stable will plan on continuing home regimen.  Hypothyroidism - Stable continue home regimen on discharge.   Procedures:  CT scan  of head x 2   Consultations:  Case discussed by associated with Dr. Wynetta Emery (neurosurgeon)  Discharge Exam: Filed Vitals:   08/16/12 1337 08/16/12 2100 08/17/12 0500 08/17/12 1049  BP: 98/71 101/69 100/67 102/60  Pulse: 61 56 60   Temp: 97.7 F (36.5 C) 98.1 F (36.7 C) 97.3 F (36.3 C)   TempSrc:      Resp: 18 20 18    Height:      Weight:      SpO2: 93% 96% 95%     General: Pt in NAD, Alert and awake Cardiovascular: RRR, No MRG Respiratory: CTA BL, no wheezes Abdomen: soft, Nt, nd  Discharge Instructions  Discharge Orders    Future Orders Please Complete By Expires   Diet - low sodium heart healthy      Increase activity slowly      Call MD for:  temperature >100.4      Call MD for:  persistant nausea and vomiting      Call MD for:  redness, tenderness, or signs of infection (pain, swelling, redness, odor or green/yellow discharge around incision site)          Medication List     As of 08/17/2012  2:23 PM    STOP taking these medications         aspirin 81 MG chewable tablet      zolpidem 5 MG tablet   Commonly known as: AMBIEN      TAKE these medications         acetaminophen 325 MG tablet   Commonly known  as: TYLENOL   Take 650 mg by mouth every 4 (four) hours as needed. For pain      beta carotene w/minerals tablet   Take 1 tablet by mouth daily.      calcium carbonate 500 MG chewable tablet   Commonly known as: TUMS - dosed in mg elemental calcium   Chew 1 tablet by mouth 3 (three) times daily.      diltiazem 120 MG tablet   Commonly known as: CARDIZEM   Take 120 mg by mouth daily.      divalproex 500 MG 24 hr tablet   Commonly known as: DEPAKOTE ER   Take 500 mg by mouth 2 (two) times daily.      guaiFENesin-dextromethorphan 100-10 MG/5ML syrup   Commonly known as: ROBITUSSIN DM   Take 5 mLs by mouth 3 (three) times daily as needed. For cough      levothyroxine 150 MCG tablet   Commonly known as: SYNTHROID, LEVOTHROID   Take 150 mcg by  mouth daily.      MELATIN PO   Take 1.5 tablets by mouth at bedtime.      phenol 1.4 % Liqd   Commonly known as: CHLORASEPTIC   Use as directed 2 sprays in the mouth or throat as needed. Sore throat      risperiDONE 1 MG tablet   Commonly known as: RISPERDAL   Take 1 mg by mouth every evening.      risperiDONE 0.5 MG tablet   Commonly known as: RISPERDAL   Take 0.5 mg by mouth every morning.      senna 8.6 MG Tabs   Commonly known as: SENOKOT   Take 1 tablet by mouth 2 (two) times daily.          The results of significant diagnostics from this hospitalization (including imaging, microbiology, ancillary and laboratory) are listed below for reference.    Significant Diagnostic Studies: Dg Shoulder Right  08/15/2012  *RADIOLOGY REPORT*  Clinical Data: Right shoulder pain  RIGHT SHOULDER - 2+ VIEW  Comparison: None.  Findings: Comminuted fracture of the distal clavicle is noted.  The coracoclavicular and acromioclavicular distances are preserved. Apex cranial angulation is seen at the clavicular fracture site.  IMPRESSION: Comminuted fracture of the distal clavicle.   Original Report Authenticated By: ERIC A. MANSELL, M.D.    Dg Hip Complete Right  08/15/2012  *RADIOLOGY REPORT*  Clinical Data: Right hip pain  RIGHT HIP - COMPLETE 2+ VIEW  Comparison: None.  Findings: Frontal view the pelvis shows diffuse bony demineralization.  SI joints and symphysis pubis are unremarkable. AP and frog-leg lateral views of the right hip show no evidence for femoral neck fracture.  IMPRESSION: No evidence for right femoral neck fracture.   Original Report Authenticated By: ERIC A. MANSELL, M.D.    Ct Head Wo Contrast  08/16/2012  *RADIOLOGY REPORT*  Clinical Data: Intracranial hemorrhage.  CT HEAD WITHOUT CONTRAST  Technique:  Contiguous axial images were obtained from the base of the skull through the vertex without contrast.  Comparison: August 15, 2012.  Findings: Bony calvarium is intact.  Mild  diffuse cortical atrophy is noted.  No mass effect or midline shift is noted.  There is no evidence of hemorrhage seen currently.  Either the previously described abnormality has resolved or represented artifact.  There is no evidence of mass lesion or infarction.  IMPRESSION: Mild diffuse cortical atrophy.  No intracranial hemorrhage is seen currently.   Original Report Authenticated By: Fayrene Fearing  J. Christen Butter., M.D.    Ct Head Wo Contrast  08/15/2012  *RADIOLOGY REPORT*  Clinical Data: Fall, right forehead laceration.  CT HEAD WITHOUT CONTRAST,CT CERVICAL SPINE WITHOUT CONTRAST  Technique:  Contiguous axial images were obtained from the base of the skull through the vertex without contrast.,Technique: Multidetector CT imaging of the cervical spine was performed. Multiplanar CT image reconstructions were also generated.  Comparison: 11/21/2011  Findings:  Head:  0.5 x 0.8 cm focus of hyperattenuation along the right frontal temporal lobe as on series 2 image 16.  No mass effect.  I am uncertain if this is within the parenchyma or extra-axial space, though favor parenchyma along the gray-white junction.  No overt hydrocephalus.  Mild white matter changes.  No definite CT evidence of acute infarction. The visualized paranasal sinuses and mastoid air cells are predominately clear.  No displaced calvarial fracture.  IMPRESSION: Small focus of hyperattenuation within the right  parietal temporal lobe, in keeping with hemorrhage.  This is favored to be a parenchymal contusion. Less likely, subarachnoid blood.  No significant mass effect.  Critical Value/emergent results were called by telephone at the time of interpretation on 08/15/2012 at 10:40 p.m. to Charlestine Night, who verbally acknowledged these results.  Cervical spine:  Biapical scarring.  Maintained craniocervical relationship.  No dens fracture. Diffuse osteopenia.  Multilevel degenerative changes.  Maintained alignment.  No acute fracture. Paravertebral soft  tissues within normal limits.  Impression:  Multilevel degenerative changes.  No acute fracture or dislocation of the cervical spine identified.   Original Report Authenticated By: Waneta Martins, M.D.    Ct Cervical Spine Wo Contrast  08/15/2012  *RADIOLOGY REPORT*  Clinical Data: Fall, right forehead laceration.  CT HEAD WITHOUT CONTRAST,CT CERVICAL SPINE WITHOUT CONTRAST  Technique:  Contiguous axial images were obtained from the base of the skull through the vertex without contrast.,Technique: Multidetector CT imaging of the cervical spine was performed. Multiplanar CT image reconstructions were also generated.  Comparison: 11/21/2011  Findings:  Head:  0.5 x 0.8 cm focus of hyperattenuation along the right frontal temporal lobe as on series 2 image 16.  No mass effect.  I am uncertain if this is within the parenchyma or extra-axial space, though favor parenchyma along the gray-white junction.  No overt hydrocephalus.  Mild white matter changes.  No definite CT evidence of acute infarction. The visualized paranasal sinuses and mastoid air cells are predominately clear.  No displaced calvarial fracture.  IMPRESSION: Small focus of hyperattenuation within the right  parietal temporal lobe, in keeping with hemorrhage.  This is favored to be a parenchymal contusion. Less likely, subarachnoid blood.  No significant mass effect.  Critical Value/emergent results were called by telephone at the time of interpretation on 08/15/2012 at 10:40 p.m. to Charlestine Night, who verbally acknowledged these results.  Cervical spine:  Biapical scarring.  Maintained craniocervical relationship.  No dens fracture. Diffuse osteopenia.  Multilevel degenerative changes.  Maintained alignment.  No acute fracture. Paravertebral soft tissues within normal limits.  Impression:  Multilevel degenerative changes.  No acute fracture or dislocation of the cervical spine identified.   Original Report Authenticated By: Waneta Martins,  M.D.     Microbiology: Recent Results (from the past 240 hour(s))  MRSA PCR SCREENING     Status: Normal   Collection Time   08/16/12  4:48 AM      Component Value Range Status Comment   MRSA by PCR NEGATIVE  NEGATIVE Final      Labs:  Basic Metabolic Panel:  Lab 08/16/12 0981  NA 138  K 4.7  CL 100  CO2 28  GLUCOSE 104*  BUN 24*  CREATININE 0.88  CALCIUM 9.0  MG --  PHOS --   Liver Function Tests:  Lab 08/16/12 0254  AST 26  ALT 13  ALKPHOS 58  BILITOT 0.2*  PROT 6.7  ALBUMIN 2.9*   No results found for this basename: LIPASE:5,AMYLASE:5 in the last 168 hours No results found for this basename: AMMONIA:5 in the last 168 hours CBC:  Lab 08/16/12 0254  WBC 10.4  NEUTROABS --  HGB 13.2  HCT 38.8  MCV 102.1*  PLT 147*   Cardiac Enzymes: No results found for this basename: CKTOTAL:5,CKMB:5,CKMBINDEX:5,TROPONINI:5 in the last 168 hours BNP: BNP (last 3 results) No results found for this basename: PROBNP:3 in the last 8760 hours CBG: No results found for this basename: GLUCAP:5 in the last 168 hours  Time coordinating discharge > 30 minutes  Spent > 15 minutes discussing finding with patient and son.  Signed:  Penny Pia  [facility] 08/17/2012, 2:23 PM

## 2012-08-17 NOTE — Progress Notes (Signed)
Laurence Slate RN of [facility] given report on above pt. Ambulance on their way to pick up pt. Thanks Ancil Linsey RN

## 2012-08-17 NOTE — Progress Notes (Signed)
CSW assessed pt at bedside.  Full assessment to follow.  Pt is from [facility] [place], [place].  Pt is agreeable to return to SNF.  CSW will contact MD to verify d/c plans.  FL2 is on chart for MD to sign. Vickii Penna, LCSWA [phone]  Clinical Social Work

## 2012-08-17 NOTE — Progress Notes (Signed)
Speech Language Pathology Dysphagia Treatment Patient Details Name: [name] MRN: [id number] DOB: 1928/09/09 Today's Date: 08/17/2012 Time: 4098-1191 SLP Time Calculation (min): 25 min  Assessment / Plan / Recommendation Clinical Impression  Patient seen for diagnostic treatment for diet tolerance of dysphagia 3 ( mechanical soft) and nectar thick liquids and for possible liquid upgrade to thin liquids following initial BSE completed on 08/16/12.  Patient observed with dysphagia 3 x1 with NTL 2 oz.  by cup.  Mastication prolonged due to lack of dentition but effective.  Dentures not available . Patient stated dentures were at "home:"  Wet vocal quality noted s/p swallow of thin water by cup indicating possible penetration due to continued  delay in initiation.  Recommend to continue current diet of dysphagia 3 and nectar thick liquids as patient continues with weakness and decreased reserve.  ST to continue to follow in acute care setting for possible diet upgrade.  Recommend continued ST treatment at next level of care .     Diet Recommendation  Continue with Current Diet: Dysphagia 3 (mechanical soft);Nectar-thick liquid    SLP Plan Continue with current plan of care      Swallowing Goals  SLP Swallowing Goals Patient will consume recommended diet without observed clinical signs of aspiration with: Minimal assistance Swallow Study Goal #1 - Progress: Progressing toward goal Patient will utilize recommended strategies during swallow to increase swallowing safety with: Minimal assistance Swallow Study Goal #2 - Progress: Progressing toward goal  General Temperature Spikes Noted: No Respiratory Status: Other (comment) (nasal cannula)  Oral Cavity - Oral Hygiene Does patient have any of the following "at risk" factors?: Diet - patient on thickened liquids;Other - dysphagia;Oxygen therapy - cannula, mask, simple oxygen devices Patient is HIGH RISK - Oral Care Protocol followed (see row  info): Yes Patient is AT RISK - Oral Care Protocol followed (see row info): Yes   Dysphagia Treatment Treatment focused on: Skilled observation of diet tolerance;Upgraded PO texture trials Treatment Methods/Modalities: Skilled observation Patient observed directly with PO's: Yes Type of PO's observed: Dysphagia 3 (soft);Nectar-thick liquids Feeding: Able to feed self;Needs assist Liquids provided via: Cup Oral Phase Signs & Symptoms: Prolonged mastication Pharyngeal Phase Signs & Symptoms: Suspected delayed swallow initiation;Multiple swallows;Wet vocal quality Type of cueing: Verbal Amount of cueing: Minimal   GO    Moreen Fowler MS, CCC-SLP [phone] [facility] 08/17/2012, 11:08 AM

## 2012-08-17 NOTE — ED Provider Notes (Signed)
Medical screening examination/treatment/procedure(s) were conducted as a shared visit with non-physician practitioner(s) and myself.  I personally evaluated the patient during the encounter Patient with a mechanical fall at the nursing home with evidence of trauma to the right side of her head and shoulder. Patient is not on anticoagulation however head CT shows possible contusion versus very small subarachnoid hemorrhage. Per nursing home and patient arrived she was at her baseline which is severe dementia. She is still awake and alert. Will admit for observation  Gwyneth Sprout, MD 08/17/12 1112

## 2012-08-17 NOTE — Evaluation (Signed)
Physical Therapy Evaluation Patient Details Name: [name] MRN: [id number] DOB: 02/14/1928 Today's Date: 08/17/2012 Time: 2952-8413 PT Time Calculation (min): 26 min  PT Assessment / Plan / Recommendation Clinical Impression  pt admitted after fall sustaining a R ICB and a comminuted clavicular fx on the R.  Will see on acute.  Pt will likely be safe to return home with  some  therapy at the facility.    PT Assessment  Patient needs continued PT services    Follow Up Recommendations  Home health PT;Supervision for mobility/OOB    Barriers to Discharge None      Equipment Recommendations  None recommended by PT    Recommendations for Other Services     Frequency Min 3X/week    Precautions / Restrictions Precautions Precautions: Fall   Pertinent Vitals/Pain       Mobility  Bed Mobility Bed Mobility: Supine to Sit;Sitting - Scoot to Edge of Bed Supine to Sit: 3: Mod assist Sitting - Scoot to Edge of Bed: 4: Min assist Details for Bed Mobility Assistance: vc's for safe technique/to lessen pain; truncal assist due to chest and R clavicular pain Transfers Transfers: Sit to Stand;Stand to Sit Sit to Stand: 4: Min assist;With upper extremity assist;From bed Stand to Sit: 4: Min assist;To chair/3-in-1 Details for Transfer Assistance: safe technique; min assistance to help pt come forward Ambulation/Gait Ambulation/Gait Assistance: 4: Min assist Ambulation Distance (Feet): 70 Feet Assistive device: Rolling walker Ambulation/Gait Assistance Details: vc/tc's for postural checks, control of RW; assist to support trunk and control of RW Gait Pattern: Step-through pattern;Decreased step length - right;Decreased step length - left;Decreased stride length;Trunk flexed;Shuffle Stairs: No Wheelchair Mobility Wheelchair Mobility: No    Shoulder Instructions     Exercises     PT Diagnosis: Difficulty walking;Generalized weakness;Acute pain  PT Problem List: Decreased  strength;Decreased balance;Decreased mobility;Decreased coordination;Decreased knowledge of use of DME;Pain PT Treatment Interventions: DME instruction;Gait training;Functional mobility training;Therapeutic activities;Therapeutic exercise;Balance training;Patient/family education   PT Goals Acute Rehab PT Goals PT Goal Formulation: With patient Time For Goal Achievement: 08/31/12 Potential to Achieve Goals: Good Pt will go Supine/Side to Sit: with supervision PT Goal: Supine/Side to Sit - Progress: Goal set today Pt will go Sit to Stand: with supervision PT Goal: Sit to Stand - Progress: Goal set today Pt will Transfer Bed to Chair/Chair to Bed: with supervision PT Transfer Goal: Bed to Chair/Chair to Bed - Progress: Goal set today Pt will Ambulate: >150 feet;with supervision;with least restrictive assistive device PT Goal: Ambulate - Progress: Goal set today  Visit Information  Last PT Received On: 08/17/12    Subjective Data  Subjective: My chest hurts Patient Stated Goal: Back able to walk around by myself   Prior Functioning  Home Living Lives With: Alone Available Help at Discharge: Skilled Nursing Facility Type of Home: Skilled Nursing Facility Home Access: Level entry Home Layout: One level Bathroom Shower/Tub: Walk-in shower;Door Bathroom Toilet: Handicapped height Home Adaptive Equipment: Grab bars in shower;Grab bars around toilet;Walker - rolling Prior Function Level of Independence: Needs assistance Needs Assistance: Bathing;Dressing Able to Take Stairs?: Yes Driving: No Communication Communication: No difficulties Dominant Hand: Right    Cognition  Overall Cognitive Status: Appears within functional limits for tasks assessed/performed Arousal/Alertness: Awake/alert Behavior During Session: [facility] for tasks performed    Extremity/Trunk Assessment Right Lower Extremity Assessment RLE ROM/Strength/Tone: [facility] for tasks assessed;Unable to fully assess;Due to  impaired cognition Left Lower Extremity Assessment LLE ROM/Strength/Tone: [facility] for tasks assessed;Unable to fully assess;Due to  impaired cognition   Balance Balance Balance Assessed: Yes Static Sitting Balance Static Sitting - Balance Support: No upper extremity supported;Feet supported Static Sitting - Level of Assistance: 5: Stand by assistance  End of Session PT - End of Session Activity Tolerance: Patient tolerated treatment well Patient left: in chair;with call bell/phone within reach Nurse Communication: Mobility status  GP     Timmey Lamba, Eliseo Gum 08/17/2012, 12:28 PM  08/17/2012  [place] Bing, PT [phone] (pager)

## 2012-08-23 NOTE — Progress Notes (Signed)
Late--Entry--PT evaluation addendum   Aug 19, 2012 1200  PT G-Codes **NOT FOR INPATIENT CLASS**  Functional Assessment Tool Used clinical judgement  Functional Limitation Mobility: Walking and moving around  Mobility: Walking and Moving Around Current Status (R6045) CI  Mobility: Walking and Moving Around Goal Status (W0981) CI  PT General Charges  $$ ACUTE PT VISIT 1 Procedure  PT Evaluation  $Initial PT Evaluation Tier I 1 Procedure  PT Treatments  $Gait Training 8-22 mins  $Therapeutic Activity 8-22 mins   08/23/2012  [place] Bing, PT [phone] (pager)

## 2012-12-31 ENCOUNTER — Emergency Department (HOSPITAL_COMMUNITY): Payer: Medicare Other

## 2012-12-31 ENCOUNTER — Emergency Department (HOSPITAL_COMMUNITY)
Admission: EM | Admit: 2012-12-31 | Discharge: 2012-12-31 | Disposition: A | Discharge status: Home / Self Care | Payer: Medicare Other | Attending: Emergency Medicine | Admitting: Emergency Medicine

## 2012-12-31 ENCOUNTER — Encounter (HOSPITAL_COMMUNITY): Payer: Self-pay | Admitting: Emergency Medicine

## 2012-12-31 DIAGNOSIS — F319 Bipolar disorder, unspecified: Secondary | ICD-10-CM | POA: Insufficient documentation

## 2012-12-31 DIAGNOSIS — J441 Chronic obstructive pulmonary disease with (acute) exacerbation: Secondary | ICD-10-CM | POA: Insufficient documentation

## 2012-12-31 DIAGNOSIS — Z8781 Personal history of (healed) traumatic fracture: Secondary | ICD-10-CM | POA: Insufficient documentation

## 2012-12-31 DIAGNOSIS — F411 Generalized anxiety disorder: Secondary | ICD-10-CM | POA: Insufficient documentation

## 2012-12-31 DIAGNOSIS — F0390 Unspecified dementia without behavioral disturbance: Secondary | ICD-10-CM | POA: Insufficient documentation

## 2012-12-31 DIAGNOSIS — E039 Hypothyroidism, unspecified: Secondary | ICD-10-CM | POA: Insufficient documentation

## 2012-12-31 DIAGNOSIS — Z8719 Personal history of other diseases of the digestive system: Secondary | ICD-10-CM | POA: Insufficient documentation

## 2012-12-31 DIAGNOSIS — I5030 Unspecified diastolic (congestive) heart failure: Secondary | ICD-10-CM | POA: Insufficient documentation

## 2012-12-31 DIAGNOSIS — F29 Unspecified psychosis not due to a substance or known physiological condition: Secondary | ICD-10-CM | POA: Insufficient documentation

## 2012-12-31 DIAGNOSIS — Z79899 Other long term (current) drug therapy: Secondary | ICD-10-CM | POA: Insufficient documentation

## 2012-12-31 DIAGNOSIS — I10 Essential (primary) hypertension: Secondary | ICD-10-CM | POA: Insufficient documentation

## 2012-12-31 DIAGNOSIS — Z8701 Personal history of pneumonia (recurrent): Secondary | ICD-10-CM | POA: Insufficient documentation

## 2012-12-31 DIAGNOSIS — H543 Unqualified visual loss, both eyes: Secondary | ICD-10-CM | POA: Insufficient documentation

## 2012-12-31 DIAGNOSIS — J449 Chronic obstructive pulmonary disease, unspecified: Secondary | ICD-10-CM

## 2012-12-31 DIAGNOSIS — I4891 Unspecified atrial fibrillation: Secondary | ICD-10-CM | POA: Insufficient documentation

## 2012-12-31 DIAGNOSIS — Z950 Presence of cardiac pacemaker: Secondary | ICD-10-CM | POA: Insufficient documentation

## 2012-12-31 DIAGNOSIS — Z87448 Personal history of other diseases of urinary system: Secondary | ICD-10-CM | POA: Insufficient documentation

## 2012-12-31 DIAGNOSIS — Z8659 Personal history of other mental and behavioral disorders: Secondary | ICD-10-CM | POA: Insufficient documentation

## 2012-12-31 DIAGNOSIS — Z8669 Personal history of other diseases of the nervous system and sense organs: Secondary | ICD-10-CM | POA: Insufficient documentation

## 2012-12-31 HISTORY — DX: Unspecified diastolic (congestive) heart failure: I50.30

## 2012-12-31 HISTORY — DX: Anxiety disorder, unspecified: F41.9

## 2012-12-31 LAB — CBC WITH DIFFERENTIAL/PLATELET
Basophils Absolute: 0 10*3/uL (ref 0.0–0.1)
Lymphocytes Relative: 7 % — ABNORMAL LOW (ref 12–46)
Lymphs Abs: 0.9 10*3/uL (ref 0.7–4.0)
Neutrophils Relative %: 85 % — ABNORMAL HIGH (ref 43–77)
Platelets: 265 10*3/uL (ref 150–400)
RBC: 4.05 MIL/uL (ref 3.87–5.11)
RDW: 13.7 % (ref 11.5–15.5)
WBC: 12.9 10*3/uL — ABNORMAL HIGH (ref 4.0–10.5)

## 2012-12-31 LAB — COMPREHENSIVE METABOLIC PANEL
ALT: 7 U/L (ref 0–35)
AST: 14 U/L (ref 0–37)
Alkaline Phosphatase: 77 U/L (ref 39–117)
CO2: 24 mEq/L (ref 19–32)
GFR calc Af Amer: 59 mL/min — ABNORMAL LOW (ref >90)
Glucose, Bld: 115 mg/dL — ABNORMAL HIGH (ref 70–99)
Potassium: 4.6 mEq/L (ref 3.5–5.1)
Sodium: 139 mEq/L (ref 135–145)
Total Protein: 7.2 g/dL (ref 6.0–8.3)

## 2012-12-31 LAB — VALPROIC ACID LEVEL: Valproic Acid Lvl: 18.2 ug/mL — ABNORMAL LOW (ref 50.0–100.0)

## 2012-12-31 MED ORDER — SODIUM CHLORIDE 0.9 % IV SOLN
INTRAVENOUS | Status: DC
  Administered 2012-12-31: 15:00:00 via INTRAVENOUS

## 2012-12-31 NOTE — ED Notes (Signed)
Tech unable to get urine with in/out cath.  Pt's diaper wet.

## 2012-12-31 NOTE — ED Notes (Signed)
NWG:[id number]<ZH> Expected date:<BR> Expected time:<BR> Means of arrival:<BR> Comments:<BR> 84 yof shortness of breath RA sat 86%

## 2012-12-31 NOTE — ED Notes (Signed)
ptar called 

## 2012-12-31 NOTE — ED Notes (Signed)
Per EMS from [facility]. Staff stated that pt was short of breath with some confusion. On their arrival RA sat was <90% was placed on 3L [place] sats 99%. Pneumonia in Dec that pt has not been able to recover from. EMS suctioned pt at [facility] with white production.

## 2012-12-31 NOTE — ED Provider Notes (Signed)
History     CSN: [id number]  Arrival date & time 12/31/12  1244   First MD Initiated Contact with Patient 12/31/12 1317      Chief Complaint  Patient presents with  . Shortness of Breath   Level V caveat for dementia  (Consider location/radiation/quality/duration/timing/severity/associated sxs/prior treatment) HPI  Patient brought from her nursing home for shortness of breath and worsening confusion. EMS reports her pulse ox initially was less than 90%. Her oxygenation improved with nasal cannula oxygen. They also were informed patient had a pneumonia in December and has been having a slow time recovering from it. Patient reports now her breathing is better. She denies cough but states she's been wheezing. She denies chest pain or abdominal pain.    Past Medical History  Diagnosis Date  . COPD (chronic obstructive pulmonary disease)   . Hypertension   . Blind   . Hypothyroid   . Pacemaker   . Renal insufficiency   . Finger fracture   . Dementia   . Bipolar disorder   . Dysphagia     needs thick nectar liquids  . Atrial fibrillation   . Pneumonia   . Anxiety   . Diastolic heart failure     Past Surgical History  Procedure Laterality Date  . Insert / replace / remove pacemaker      History reviewed. No pertinent family history.  History  Substance Use Topics  . Smoking status: Never Smoker   . Smokeless tobacco: Not on file  . Alcohol Use: Not on file   lives in nursing home  OB History   Grav Para Term Preterm Abortions TAB SAB Ect Mult Living                  Review of Systems  Unable to perform ROS: Dementia    Allergies  Review of patient's allergies indicates no known allergies.  Home Medications   Current Outpatient Rx  Name  Route  Sig  Dispense  Refill  . acetaminophen (TYLENOL) 325 MG tablet   Oral   Take 650 mg by mouth every 4 (four) hours as needed. For pain          . benztropine (COGENTIN) 0.5 MG tablet   Oral   Take 0.5 mg  by mouth at bedtime.         . beta carotene w/minerals (OCUVITE) tablet   Oral   Take 1 tablet by mouth daily.         . calcium carbonate (TUMS - DOSED IN MG ELEMENTAL CALCIUM) 500 MG chewable tablet   Oral   Chew 1 tablet by mouth 3 (three) times daily.           Marland Kitchen diltiazem (CARDIZEM) 120 MG tablet   Oral   Take 120 mg by mouth daily.           . divalproex (DEPAKOTE SPRINKLE) 125 MG capsule   Oral   Take 500 mg by mouth at bedtime.         . DULoxetine (CYMBALTA) 30 MG capsule   Oral   Take 30 mg by mouth daily.         Marland Kitchen guaiFENesin-dextromethorphan (ROBITUSSIN DM) 100-10 MG/5ML syrup   Oral   Take 5 mLs by mouth 3 (three) times daily as needed. For cough          . lactulose (CHRONULAC) 10 GM/15ML solution   Oral   Take 10 g by mouth daily.         Marland Kitchen  levofloxacin (LEVAQUIN) 750 MG tablet   Oral   Take 750 mg by mouth daily. x10 days starting 12/17/12         . levothyroxine (SYNTHROID, LEVOTHROID) 175 MCG tablet   Oral   Take 175 mcg by mouth daily.         Marland Kitchen LORazepam (ATIVAN) 0.5 MG tablet   Oral   Take 0.5 mg by mouth daily as needed for anxiety.         . megestrol (MEGACE ES) 625 MG/5ML suspension   Oral   Take 625 mg by mouth daily.         . Melatonin 3 MG TABS   Oral   Take 3 mg by mouth at bedtime.         . meloxicam (MOBIC) 7.5 MG tablet   Oral   Take 7.5 mg by mouth daily.         . phenol (CHLORASEPTIC) 1.4 % LIQD   Mouth/Throat   Use as directed 2 sprays in the mouth or throat as needed. Sore throat         . senna (SENOKOT) 8.6 MG TABS   Oral   Take 1 tablet by mouth 2 (two) times daily.         Marland Kitchen saccharomyces boulardii (FLORASTOR) 250 MG capsule   Oral   Take 250 mg by mouth 2 (two) times daily. x10 days           BP 109/70  Pulse 61  Resp 17  SpO2 100%  Vital signs normal    Physical Exam  Nursing note and vitals reviewed. Constitutional: She appears well-developed and  well-nourished.  Non-toxic appearance. She does not appear ill. No distress.  HENT:  Head: Normocephalic and atraumatic.  Right Ear: External ear normal.  Left Ear: External ear normal.  Nose: Nose normal. No mucosal edema or rhinorrhea.  Mouth/Throat: Oropharynx is clear and moist and mucous membranes are normal. No dental abscesses or edematous.  Eyes: Conjunctivae and EOM are normal. Pupils are equal, round, and reactive to light.  Neck: Normal range of motion and full passive range of motion without pain. Neck supple.  Cardiovascular: Normal rate, regular rhythm and normal heart sounds.  Exam reveals no gallop and no friction rub.   No murmur heard. Pulmonary/Chest: Effort normal. No respiratory distress. She has no wheezes. She has no rhonchi. She has no rales. She exhibits no tenderness and no crepitus.  Patient has diminished breath sounds but no wheezes or rhonchi heard  Abdominal: Soft. Normal appearance and bowel sounds are normal. She exhibits no distension. There is no tenderness. There is no rebound and no guarding.  Musculoskeletal: Normal range of motion. She exhibits no edema and no tenderness.  Moves all extremities well.   Neurological: She is alert. She has normal strength. No cranial nerve deficit.   Patient has confusion  Skin: Skin is warm, dry and intact. No rash noted. No erythema. No pallor.  Psychiatric: She has a normal mood and affect. Her speech is normal and behavior is normal. Her mood appears not anxious.    ED Course  Procedures (including critical care time)  14:10 Dr Bradly Chris called to get more history and to suggest get decubitus xray  Recheck 16:00 pulse ox 98% on RA, pt sleeping in no distress, will discharge  Results for orders placed during the hospital encounter of 12/31/12  CBC WITH DIFFERENTIAL      Result Value Range   WBC 12.9 (*)  4.0 - 10.5 K/uL   RBC 4.05  3.87 - 5.11 MIL/uL   Hemoglobin 13.7  12.0 - 15.0 g/dL   HCT 16.1  09.6 - 04.5 %    MCV 101.0 (*) 78.0 - 100.0 fL   MCH 33.8  26.0 - 34.0 pg   MCHC 33.5  30.0 - 36.0 g/dL   RDW 40.9  81.1 - 91.4 %   Platelets 265  150 - 400 K/uL   Neutrophils Relative 85 (*) 43 - 77 %   Neutro Abs 10.9 (*) 1.7 - 7.7 K/uL   Lymphocytes Relative 7 (*) 12 - 46 %   Lymphs Abs 0.9  0.7 - 4.0 K/uL   Monocytes Relative 8  3 - 12 %   Monocytes Absolute 1.0  0.1 - 1.0 K/uL   Eosinophils Relative 0  0 - 5 %   Eosinophils Absolute 0.0  0.0 - 0.7 K/uL   Basophils Relative 0  0 - 1 %   Basophils Absolute 0.0  0.0 - 0.1 K/uL  COMPREHENSIVE METABOLIC PANEL      Result Value Range   Sodium 139  135 - 145 mEq/L   Potassium 4.6  3.5 - 5.1 mEq/L   Chloride 101  96 - 112 mEq/L   CO2 24  19 - 32 mEq/L   Glucose, Bld 115 (*) 70 - 99 mg/dL   BUN 27 (*) 6 - 23 mg/dL   Creatinine, Ser 7.82  0.50 - 1.10 mg/dL   Calcium 9.5  8.4 - 95.6 mg/dL   Total Protein 7.2  6.0 - 8.3 g/dL   Albumin 2.7 (*) 3.5 - 5.2 g/dL   AST 14  0 - 37 U/L   ALT 7  0 - 35 U/L   Alkaline Phosphatase 77  39 - 117 U/L   Total Bilirubin 0.3  0.3 - 1.2 mg/dL   GFR calc non Af Amer 51 (*) >90 mL/min   GFR calc Af Amer 59 (*) >90 mL/min   Laboratory interpretation all normal except leukocytosis    Dg Chest 2 View  12/31/2012  *RADIOLOGY REPORT*  Clinical Data: Shortness of breath and weakness  CHEST - 2 VIEW  Comparison: 11/21/2011  Findings: There is a left chest wall pacer device with lead in the right atrial appendage and right ventricle.  The heart size is normal.  There are decreased lung volumes with bibasilar atelectasis.  Lucency along the undersurface of the right hemidiaphragm is noted.  This is a new finding when compared with the patient's old exams.  Cannot rule out free intraperitoneal air. Multiple, chronic appearing right posterior rib fractures are noted.  IMPRESSION:  1.  Decreased lung volumes with bibasilar atelectasis. 2.  Lucency along the undersurface of the right hemidiaphragm. Cannot rule out free air.   Recommend left-side down lateral decubitus radiograph of the abdomen.  Critical Value/emergent results were called by telephone at the time of interpretation on 12/31/2012  at 2:11 p.m.  to Dr. Lynelle Doctor, who verbally acknowledged these results.   Original Report Authenticated By: Signa Kell, M.D.      Date: 12/31/2012  Rate: 63  Rhythm: normal sinus rhythm  QRS Axis: normal  Intervals: normal  ST/T Wave abnormalities: normal  Conduction Disutrbances:none  Narrative Interpretation: Q waves inf/ant leads  Old EKG Reviewed: unchanged from 08/16/2012      1. COPD (chronic obstructive pulmonary disease)     Plan discharge  Devoria Albe, MD, FACEP   MDM  Ward Givens, MD 12/31/12 [phone]

## 2013-01-06 LAB — CULTURE, BLOOD (ROUTINE X 2): Culture: NO GROWTH

## 2013-02-15 ENCOUNTER — Non-Acute Institutional Stay (SKILLED_NURSING_FACILITY): Payer: Medicare Other | Admitting: Adult Health

## 2013-02-15 ENCOUNTER — Encounter: Payer: Self-pay | Admitting: Adult Health

## 2013-02-15 DIAGNOSIS — F319 Bipolar disorder, unspecified: Secondary | ICD-10-CM

## 2013-02-15 DIAGNOSIS — R1310 Dysphagia, unspecified: Secondary | ICD-10-CM

## 2013-02-15 DIAGNOSIS — E039 Hypothyroidism, unspecified: Secondary | ICD-10-CM

## 2013-02-15 DIAGNOSIS — J449 Chronic obstructive pulmonary disease, unspecified: Secondary | ICD-10-CM | POA: Insufficient documentation

## 2013-02-15 DIAGNOSIS — N390 Urinary tract infection, site not specified: Secondary | ICD-10-CM

## 2013-02-15 DIAGNOSIS — G8929 Other chronic pain: Secondary | ICD-10-CM | POA: Insufficient documentation

## 2013-02-15 DIAGNOSIS — K5900 Constipation, unspecified: Secondary | ICD-10-CM | POA: Insufficient documentation

## 2013-02-15 DIAGNOSIS — I10 Essential (primary) hypertension: Secondary | ICD-10-CM

## 2013-02-15 DIAGNOSIS — R634 Abnormal weight loss: Secondary | ICD-10-CM

## 2013-02-15 DIAGNOSIS — G2401 Drug induced subacute dyskinesia: Secondary | ICD-10-CM

## 2013-02-15 NOTE — Assessment & Plan Note (Signed)
She is presently stable is taking mobic 7.5 mg daily

## 2013-02-15 NOTE — Assessment & Plan Note (Signed)
Her mood state is stable she is doing well since her depakote was stopped. Is taking cymbalta 60 mg daily is taking melatonin 3 mg nightly

## 2013-02-15 NOTE — Assessment & Plan Note (Signed)
She is very little abnormal movements present; is taking cogentin 0.5 mg nightly

## 2013-02-15 NOTE — Assessment & Plan Note (Signed)
Is presently stable is taking cardizem cd 120 mg daily

## 2013-02-15 NOTE — Progress Notes (Signed)
Patient ID: [name], female   DOB: Nov 25, 1927, 79 y.o.   MRN: [id number]  Chief Complaint  Patient presents with  . Medical Managment of Chronic Issues    HPI:  Essential hypertension, benign Is presently stable is taking cardizem cd 120 mg daily   Tardive dyskinesia She is very little abnormal movements present; is taking cogentin 0.5 mg nightly   Bipolar affective disorder Her mood state is stable she is doing well since her depakote was stopped. Is taking cymbalta 60 mg daily is taking melatonin 3 mg nightly   Weight loss Her weight has remained stable over the past 3 months she is taking megace es 5 cc daily   Unspecified constipation Is stable is taking lactulose 15 cc daily takes senna twice daily   Hypothyroidism Her tsh is low is taking synthroid 175 mcg daily   COPD (chronic obstructive pulmonary disease) She is presently stable is taking duoneb every 6 hours as needed   Chronic pain She is presently stable is taking mobic 7.5 mg daily      Past Medical History  Diagnosis Date  . COPD (chronic obstructive pulmonary disease)   . Hypertension   . Blind   . Hypothyroid   . Pacemaker   . Renal insufficiency   . Finger fracture   . Dementia   . Bipolar disorder   . Dysphagia     needs thick nectar liquids  . Atrial fibrillation   . Pneumonia   . Anxiety   . Diastolic heart failure     Past Surgical History  Procedure Laterality Date  . Insert / replace / remove pacemaker      VITAL SIGNS BP 118/72  Pulse 70  Ht 5\' 9"  (1.753 m)  Wt 148 lb (67.132 kg)  BMI 21.85 kg/m2   Patient's Medications  New Prescriptions   No medications on file  Previous Medications   ACETAMINOPHEN (TYLENOL) 325 MG TABLET    Take 650 mg by mouth every 4 (four) hours as needed. For pain    BENZTROPINE (COGENTIN) 0.5 MG TABLET    Take 0.5 mg by mouth at bedtime.   BETA CAROTENE W/MINERALS (OCUVITE) TABLET    Take 1 tablet by mouth daily.   CALCIUM CARBONATE (TUMS -  DOSED IN MG ELEMENTAL CALCIUM) 500 MG CHEWABLE TABLET    Chew 1 tablet by mouth 3 (three) times daily.     DILTIAZEM (CARDIZEM) 120 MG TABLET    Take 120 mg by mouth daily.     DIVALPROEX (DEPAKOTE SPRINKLE) 125 MG CAPSULE    Take 500 mg by mouth at bedtime.   DULOXETINE (CYMBALTA) 30 MG CAPSULE    Take 60 mg by mouth daily.    FOOD THICKENER (THICK IT) POWD    Take 1 Container by mouth as needed. Nectar thick liqiud   GUAIFENESIN-DEXTROMETHORPHAN (ROBITUSSIN DM) 100-10 MG/5ML SYRUP    Take 5 mLs by mouth 3 (three) times daily as needed. For cough    HYDROCODONE-ACETAMINOPHEN (NORCO/VICODIN) 5-325 MG PER TABLET    Take 1 tablet by mouth every 4 (four) hours as needed for pain.   IPRATROPIUM-ALBUTEROL (DUONEB) 0.5-2.5 (3) MG/3ML SOLN    Take 3 mLs by nebulization every 6 (six) hours as needed.   LACTULOSE (CHRONULAC) 10 GM/15ML SOLUTION    Take 10 g by mouth daily.   LEVOFLOXACIN (LEVAQUIN) 750 MG TABLET    Take 750 mg by mouth daily. x10 days starting 12/17/12   LEVOTHYROXINE (SYNTHROID, LEVOTHROID) 175 MCG TABLET  Take 175 mcg by mouth daily.   LORAZEPAM (ATIVAN) 0.5 MG TABLET    Take 0.5 mg by mouth daily as needed for anxiety.   MEGESTROL (MEGACE ES) 625 MG/5ML SUSPENSION    Take 625 mg by mouth daily.   MELATONIN 3 MG TABS    Take 3 mg by mouth at bedtime.   MELOXICAM (MOBIC) 7.5 MG TABLET    Take 7.5 mg by mouth daily.   PHENOL (CHLORASEPTIC) 1.4 % LIQD    Use as directed 2 sprays in the mouth or throat as needed. Sore throat   SACCHAROMYCES BOULARDII (FLORASTOR) 250 MG CAPSULE    Take 250 mg by mouth 2 (two) times daily. x10 days   SENNA (SENOKOT) 8.6 MG TABS    Take 1 tablet by mouth 2 (two) times daily.  Modified Medications   No medications on file  Discontinued Medications   No medications on file    SIGNIFICANT DIAGNOSTIC EXAMS     Component Value Date/Time   ALBUMIN 2.7* 12/31/2012 1430   AST 14 12/31/2012 1430   ALT 7 12/31/2012 1430   ALKPHOS 77 12/31/2012 1430   BILITOT 0.3  12/31/2012 1430       Component Value Date/Time   BUN 27* 12/31/2012 1430   GLUCOSE 115* 12/31/2012 1430   CREATININE 1.00 12/31/2012 1430   K 4.6 12/31/2012 1430   NA 139 12/31/2012 1430   T         Component Value Date/Time   WBC 12.9* 12/31/2012 1430   RBC 4.05 12/31/2012 1430   HGB 13.7 12/31/2012 1430   HCT 40.9 12/31/2012 1430   PLT 265 12/31/2012 1430   MCV 101.0* 12/31/2012 1430   01-30-13: tsh 0.279; free t3: 1.8; free t4: 2.02    Review of Systems  Unable to perform ROS    Physical Exam  Constitutional:  Thin   Neck: Neck supple.  Cardiovascular: Normal rate, regular rhythm and intact distal pulses.   Respiratory: Effort normal and breath sounds normal.  GI: Soft. Bowel sounds are normal.  Musculoskeletal: Normal range of motion.  Is out of bed to wheelchair  Neurological: She is alert.  Oriented to self  Skin: Skin is warm and dry.  Psychiatric: She has a normal mood and affect.       ASSESSMENT/ PLAN:  Will lower her synthroid to 150 mcg daily in 6 weeks will check tsh free t3 and free t4; will continue to monitor her status and will make further changes as indicated.

## 2013-02-15 NOTE — Assessment & Plan Note (Signed)
Her weight has remained stable over the past 3 months she is taking megace es 5 cc daily

## 2013-02-15 NOTE — Assessment & Plan Note (Signed)
Her tsh is low is taking synthroid 175 mcg daily

## 2013-02-15 NOTE — Assessment & Plan Note (Signed)
She is presently stable is taking duoneb every 6 hours as needed

## 2013-02-15 NOTE — Assessment & Plan Note (Signed)
Is stable is taking lactulose 15 cc daily takes senna twice daily

## 2013-02-20 ENCOUNTER — Encounter: Payer: Self-pay | Admitting: Internal Medicine

## 2013-02-20 ENCOUNTER — Non-Acute Institutional Stay (SKILLED_NURSING_FACILITY): Payer: Medicare Other | Admitting: Internal Medicine

## 2013-02-20 DIAGNOSIS — S43004A Unspecified dislocation of right shoulder joint, initial encounter: Secondary | ICD-10-CM

## 2013-02-20 DIAGNOSIS — S43006A Unspecified dislocation of unspecified shoulder joint, initial encounter: Secondary | ICD-10-CM | POA: Insufficient documentation

## 2013-02-20 DIAGNOSIS — F0390 Unspecified dementia without behavioral disturbance: Secondary | ICD-10-CM

## 2013-02-20 NOTE — Assessment & Plan Note (Signed)
Continues to progress, unfortunately, with increased dysphagia, difficulty feeding herself.  Spoke with Morton Peters today and he would like [place] to reevaluate her psychiatric regimen and try to put her back on her previous regimen.  I spoke with York Spaniel from [facility], as well.  She reviewed the previous regimen which included risperdal which she has not been able to tolerate due to the tremors.  She was also on depakote until recently.  We agree that resuming any of these prior medicines will only make her worse.  She continues on cymbalta 60mg  and cogentin at this time.  [facility] will be back next Wednesday to reassess her and give Bo a call.

## 2013-02-20 NOTE — Assessment & Plan Note (Addendum)
Newly anteriorly dislocated right shoulder with increased pain, tachypnea.  Given hydrocodone/apap once here (is her usual prn that has not been necessary for some time).  Pt no longer able to feed herself and generally declining with her dementia.  She falls frequently, but has not fallen proximal to this change in shoulder position.  She is known to have a poorly healed clavicular fx on that side, but this is worse and she appears distressed.  Will refer her back to orthopedics for reassessment of the shoulder--previously saw Dr. Denton Lank.

## 2013-02-20 NOTE — Progress Notes (Signed)
Patient ID: [name], female   DOB: [birth year], 79 y.o.   MRN: [id number]  Chief Complaint: acute for respiratory distress  HPI:   79 yo female with h/o [name] and episode of pneumonia in February, dementia, bipolar disorder, htn, and frequent falls was seen today due to [name] concerns about dyspnea, wheezing and tachypnea.  She was also flushed.  Staff note she often has this appearance when in pain or anxious.  She has a h/o right comminuted fracture of her distal clavicle which did not heal properly due to her continued use of the arm despite a sling and frequent reminders about not using her right arm to stand.  When I saw her, she c/o increased pain in her shoulder and it was grossly dislocated.  The general consensus among staff is that her shoulder is indeed protruding anteriorly worse than normal.  This was discussed with [name], [name], [name], and [name] staff.  Review of Systems:  Review of Systems  Constitutional: Positive for weight loss. Negative for fever.  HENT: Positive for congestion and sore throat.        Chronic c/o sore throat  Respiratory: Positive for shortness of breath and wheezing. Negative for cough and sputum production.   Cardiovascular: Negative for chest pain.  Gastrointestinal: Negative for abdominal pain.  Musculoskeletal: Positive for joint pain and falls.       Right shoulder pain present, increased No recent fall Chronic stooped posture with rounding of shoulders  Skin: Positive for rash.  Endo/Heme/Allergies: Does not bruise/bleed easily.  Psychiatric/Behavioral: Positive for memory loss.       Bipolar disorder   Medications: Patient's Medications  New Prescriptions   No medications on file  Previous Medications   ACETAMINOPHEN (TYLENOL) 325 MG TABLET    Take 650 mg by mouth every 4 (four) hours as needed. For pain    BENZTROPINE (COGENTIN) 0.5 MG TABLET    Take 0.5 mg by mouth at bedtime.   CALCIUM CARBONATE (TUMS - DOSED IN MG ELEMENTAL CALCIUM) 500 MG  CHEWABLE TABLET    Chew 1 tablet by mouth 3 (three) times daily.     DILTIAZEM (CARDIZEM) 120 MG TABLET    Take 120 mg by mouth daily.     DULOXETINE (CYMBALTA) 30 MG CAPSULE    Take 60 mg by mouth daily.    FOOD THICKENER (THICK IT) POWD    Take 1 Container by mouth as needed. Nectar thick liqiud   GUAIFENESIN-DEXTROMETHORPHAN (ROBITUSSIN DM) 100-10 MG/5ML SYRUP    Take 5 mLs by mouth 3 (three) times daily as needed. For cough    HYDROCODONE-ACETAMINOPHEN (NORCO/VICODIN) 5-325 MG PER TABLET    Take 1 tablet by mouth every 4 (four) hours as needed for pain.   IPRATROPIUM-ALBUTEROL (DUONEB) 0.5-2.5 (3) MG/3ML SOLN    Take 3 mLs by nebulization every 6 (six) hours as needed.   LACTULOSE (CHRONULAC) 10 GM/15ML SOLUTION    Take 10 g by mouth daily.   LEVOTHYROXINE (SYNTHROID, LEVOTHROID) 175 MCG TABLET    Take 175 mcg by mouth daily.   MEGESTROL (MEGACE ES) 625 MG/5ML SUSPENSION    Take 625 mg by mouth daily.   MELATONIN 3 MG TABS    Take 3 mg by mouth at bedtime.   MELOXICAM (MOBIC) 7.5 MG TABLET    Take 7.5 mg by mouth daily.   MICONAZOLE (MICOTIN) 2 % POWDER    Apply topically as needed for itching.   PHENOL (CHLORASEPTIC) 1.4 % LIQD    Use  as directed 2 sprays in the mouth or throat as needed. Sore throat   SENNA (SENOKOT) 8.6 MG TABS    Take 1 tablet by mouth 2 (two) times daily.  Modified Medications   No medications on file  Discontinued Medications   BETA CAROTENE W/MINERALS (OCUVITE) TABLET    Take 1 tablet by mouth daily.   DIVALPROEX (DEPAKOTE SPRINKLE) 125 MG CAPSULE    Take 500 mg by mouth at bedtime.   LEVOFLOXACIN (LEVAQUIN) 750 MG TABLET    Take 750 mg by mouth daily. x10 days starting 12/17/12   LORAZEPAM (ATIVAN) 0.5 MG TABLET    Take 0.5 mg by mouth daily as needed for anxiety.   SACCHAROMYCES BOULARDII (FLORASTOR) 250 MG CAPSULE    Take 250 mg by mouth 2 (two) times daily. x10 days   Physical Exam:  Filed Vitals:   02/20/13 1111  BP: 94/62  Pulse: 74  Temp: 96.9 F (36.1  C)  Resp: 22   Physical Exam  Constitutional: She appears distressed.  Frail Caucasian female in mild distress  HENT:  Head: Normocephalic and atraumatic.  Eyes: Pupils are equal, round, and reactive to light.  Cardiovascular: Normal rate, regular rhythm, normal heart sounds and intact distal pulses.   Pulmonary/Chest: She is in respiratory distress. She has wheezes. She has no rales.  Abdominal: Soft. Bowel sounds are normal.  Musculoskeletal:  Right shoulder anteriorly dislocated, decreased space between acromion and neck, tender to palpation, not able to move shoulder  Neurological: She is alert.  Oriented to person only.  Skin: Skin is warm and dry. There is erythema.  Psychiatric: Her mood appears anxious. She is slowed. Cognition and memory are impaired. She expresses impulsivity and inappropriate judgment. She exhibits abnormal recent memory.   Labs reviewed: Basic Metabolic Panel:  Recent Labs  16/10/96 0254 12/31/12 1430  NA 138 139  K 4.7 4.6  CL 100 101  CO2 28 24  GLUCOSE 104* 115*  BUN 24* 27*  CREATININE 0.88 1.00  CALCIUM 9.0 9.5    Liver Function Tests:  Recent Labs  08/16/12 0254 12/31/12 1430  AST 26 14  ALT 13 7  ALKPHOS 58 77  BILITOT 0.2* 0.3  PROT 6.7 7.2  ALBUMIN 2.9* 2.7*    CBC:  Recent Labs  08/16/12 0254 12/31/12 1430  WBC 10.4 12.9*  NEUTROABS  --  10.9*  HGB 13.2 13.7  HCT 38.8 40.9  MCV 102.1* 101.0*  PLT 147* 265    Significant Diagnostic Results: 08/15/12:  Right shoulder xray:  Comminuted right distal clavicle fracture  Assessment/Plan Shoulder dislocation Newly anteriorly dislocated right shoulder with increased pain, tachypnea.  Given hydrocodone/apap once here (is her usual prn that has not been necessary for some time).  [name] no longer able to feed herself and generally declining with her dementia.  She falls frequently, but has not fallen proximal to this change in shoulder position.  She is known to have a  poorly healed clavicular fx on that side, but this is worse and she appears distressed.  Will refer her back to orthopedics for reassessment of the shoulder--previously saw [name].    Dementia Continues to progress, unfortunately, with increased dysphagia, difficulty feeding herself.  Spoke with Morton Peters today and he would like [place] to reevaluate her psychiatric regimen and try to put her back on her previous regimen.  I spoke with York Spaniel from [facility], as well.  She reviewed the previous regimen which included risperdal which she has not  been able to tolerate due to the tremors.  She was also on depakote until recently.  We agree that resuming any of these prior medicines will only make her worse.  She continues on cymbalta 60mg  and cogentin at this time.  [facility] will be back next Wednesday to reassess her and give Bo a call.   Family/ staff Communication:  Spoke with [name] staff, [name], [name], and psychiatry NP about her, as well as called her son.    Labs/tests ordered:  Orthopedics reevaluation of right shoulder

## 2013-02-24 ENCOUNTER — Emergency Department (HOSPITAL_COMMUNITY): Payer: Medicare Other

## 2013-02-24 ENCOUNTER — Inpatient Hospital Stay (HOSPITAL_COMMUNITY): Payer: Medicare Other

## 2013-02-24 ENCOUNTER — Encounter (HOSPITAL_COMMUNITY): Payer: Self-pay | Admitting: *Deleted

## 2013-02-24 ENCOUNTER — Inpatient Hospital Stay (HOSPITAL_COMMUNITY)
Admission: EM | Admit: 2013-02-24 | Discharge: 2013-02-28 | DRG: 004 | Disposition: A | Discharge status: Other Acute Inpatient Hospital | Payer: Medicare Other | Attending: Pulmonary Disease | Admitting: Pulmonary Disease

## 2013-02-24 DIAGNOSIS — J449 Chronic obstructive pulmonary disease, unspecified: Secondary | ICD-10-CM | POA: Diagnosis present

## 2013-02-24 DIAGNOSIS — I10 Essential (primary) hypertension: Secondary | ICD-10-CM | POA: Diagnosis present

## 2013-02-24 DIAGNOSIS — F319 Bipolar disorder, unspecified: Secondary | ICD-10-CM | POA: Diagnosis present

## 2013-02-24 DIAGNOSIS — R4181 Age-related cognitive decline: Secondary | ICD-10-CM | POA: Diagnosis present

## 2013-02-24 DIAGNOSIS — J9600 Acute respiratory failure, unspecified whether with hypoxia or hypercapnia: Principal | ICD-10-CM | POA: Diagnosis present

## 2013-02-24 DIAGNOSIS — F411 Generalized anxiety disorder: Secondary | ICD-10-CM | POA: Diagnosis present

## 2013-02-24 DIAGNOSIS — J690 Pneumonitis due to inhalation of food and vomit: Secondary | ICD-10-CM | POA: Diagnosis present

## 2013-02-24 DIAGNOSIS — Z930 Tracheostomy status: Secondary | ICD-10-CM

## 2013-02-24 DIAGNOSIS — J384 Edema of larynx: Secondary | ICD-10-CM | POA: Diagnosis present

## 2013-02-24 DIAGNOSIS — E039 Hypothyroidism, unspecified: Secondary | ICD-10-CM | POA: Diagnosis present

## 2013-02-24 DIAGNOSIS — F0391 Unspecified dementia with behavioral disturbance: Secondary | ICD-10-CM | POA: Diagnosis present

## 2013-02-24 DIAGNOSIS — Z950 Presence of cardiac pacemaker: Secondary | ICD-10-CM

## 2013-02-24 DIAGNOSIS — J4489 Other specified chronic obstructive pulmonary disease: Secondary | ICD-10-CM | POA: Diagnosis present

## 2013-02-24 DIAGNOSIS — I509 Heart failure, unspecified: Secondary | ICD-10-CM | POA: Diagnosis present

## 2013-02-24 DIAGNOSIS — J988 Other specified respiratory disorders: Secondary | ICD-10-CM

## 2013-02-24 DIAGNOSIS — E873 Alkalosis: Secondary | ICD-10-CM | POA: Diagnosis present

## 2013-02-24 DIAGNOSIS — R1310 Dysphagia, unspecified: Secondary | ICD-10-CM | POA: Diagnosis present

## 2013-02-24 DIAGNOSIS — F0390 Unspecified dementia without behavioral disturbance: Secondary | ICD-10-CM | POA: Diagnosis present

## 2013-02-24 DIAGNOSIS — I5032 Chronic diastolic (congestive) heart failure: Secondary | ICD-10-CM | POA: Diagnosis present

## 2013-02-24 DIAGNOSIS — J9690 Respiratory failure, unspecified, unspecified whether with hypoxia or hypercapnia: Secondary | ICD-10-CM

## 2013-02-24 DIAGNOSIS — G8929 Other chronic pain: Secondary | ICD-10-CM

## 2013-02-24 DIAGNOSIS — Z79899 Other long term (current) drug therapy: Secondary | ICD-10-CM

## 2013-02-24 DIAGNOSIS — I4891 Unspecified atrial fibrillation: Secondary | ICD-10-CM | POA: Diagnosis present

## 2013-02-24 LAB — URINE MICROSCOPIC-ADD ON

## 2013-02-24 LAB — COMPREHENSIVE METABOLIC PANEL
ALT: 27 U/L (ref 0–35)
Albumin: 2.9 g/dL — ABNORMAL LOW (ref 3.5–5.2)
Alkaline Phosphatase: 72 U/L (ref 39–117)
Glucose, Bld: 261 mg/dL — ABNORMAL HIGH (ref 70–99)
Potassium: 4.6 mEq/L (ref 3.5–5.1)
Sodium: 137 mEq/L (ref 135–145)
Total Protein: 6.5 g/dL (ref 6.0–8.3)

## 2013-02-24 LAB — CBC WITH DIFFERENTIAL/PLATELET
Basophils Absolute: 0.1 10*3/uL (ref 0.0–0.1)
Eosinophils Relative: 1 % (ref 0–5)
HCT: 40.5 % (ref 36.0–46.0)
Lymphocytes Relative: 42 % (ref 12–46)
MCHC: 33.3 g/dL (ref 30.0–36.0)
MCV: 101.5 fL — ABNORMAL HIGH (ref 78.0–100.0)
Monocytes Absolute: 1 10*3/uL (ref 0.1–1.0)
RDW: 14.3 % (ref 11.5–15.5)
WBC: 13.9 10*3/uL — ABNORMAL HIGH (ref 4.0–10.5)

## 2013-02-24 LAB — URINALYSIS, ROUTINE W REFLEX MICROSCOPIC
Glucose, UA: 250 mg/dL — AB
Specific Gravity, Urine: 1.011 (ref 1.005–1.030)
pH: 6.5 (ref 5.0–8.0)

## 2013-02-24 LAB — POCT I-STAT 3, ART BLOOD GAS (G3+)
Bicarbonate: 18 mEq/L — ABNORMAL LOW (ref 20.0–24.0)
O2 Saturation: 99 %
TCO2: 19 mmol/L (ref 0–100)
pCO2 arterial: 44.9 mmHg (ref 35.0–45.0)

## 2013-02-24 LAB — BLOOD GAS, ARTERIAL
Acid-base deficit: 3.4 mmol/L — ABNORMAL HIGH (ref 0.0–2.0)
Drawn by: 252031
FIO2: 0.5 %
MECHVT: 480 mL
O2 Saturation: 99.2 %
RATE: 17 resp/min
pO2, Arterial: 184 mmHg — ABNORMAL HIGH (ref 80.0–100.0)

## 2013-02-24 LAB — MRSA PCR SCREENING: MRSA by PCR: NEGATIVE

## 2013-02-24 LAB — LACTIC ACID, PLASMA: Lactic Acid, Venous: 2.4 mmol/L — ABNORMAL HIGH (ref 0.5–2.2)

## 2013-02-24 MED ORDER — ASPIRIN 300 MG RE SUPP
300.0000 mg | RECTAL | Status: AC
  Administered 2013-02-24: 300 mg via RECTAL
  Filled 2013-02-24: qty 1

## 2013-02-24 MED ORDER — VANCOMYCIN HCL IN DEXTROSE 1-5 GM/200ML-% IV SOLN
1000.0000 mg | INTRAVENOUS | Status: DC
  Administered 2013-02-24 – 2013-02-25 (×2): 1000 mg via INTRAVENOUS
  Filled 2013-02-24 (×3): qty 200

## 2013-02-24 MED ORDER — DULOXETINE HCL 60 MG PO CPEP
60.0000 mg | ORAL_CAPSULE | Freq: Every day | ORAL | Status: DC
  Administered 2013-02-25 – 2013-02-28 (×4): 60 mg via ORAL
  Filled 2013-02-24 (×4): qty 1

## 2013-02-24 MED ORDER — SODIUM CHLORIDE 0.9 % IV SOLN
INTRAVENOUS | Status: DC
  Administered 2013-02-24: 21:00:00 via INTRAVENOUS

## 2013-02-24 MED ORDER — ETOMIDATE 2 MG/ML IV SOLN
0.3000 mg/kg | Freq: Once | INTRAVENOUS | Status: AC
  Administered 2013-02-24: 20 mg via INTRAVENOUS

## 2013-02-24 MED ORDER — PANTOPRAZOLE SODIUM 40 MG IV SOLR
40.0000 mg | Freq: Every day | INTRAVENOUS | Status: DC
  Administered 2013-02-24 – 2013-02-27 (×4): 40 mg via INTRAVENOUS
  Filled 2013-02-24 (×7): qty 40

## 2013-02-24 MED ORDER — ASPIRIN 81 MG PO CHEW
324.0000 mg | CHEWABLE_TABLET | ORAL | Status: AC
  Filled 2013-02-24: qty 1

## 2013-02-24 MED ORDER — PROPOFOL 10 MG/ML IV EMUL
5.0000 ug/kg/min | INTRAVENOUS | Status: DC
  Administered 2013-02-24: 5 ug/kg/min via INTRAVENOUS
  Filled 2013-02-24: qty 100

## 2013-02-24 MED ORDER — MIDAZOLAM HCL 2 MG/2ML IJ SOLN
5.0000 mg | Freq: Once | INTRAMUSCULAR | Status: AC
  Administered 2013-02-24: 5 mg via INTRAVENOUS
  Filled 2013-02-24: qty 6

## 2013-02-24 MED ORDER — PIPERACILLIN-TAZOBACTAM 3.375 G IVPB
3.3750 g | Freq: Three times a day (TID) | INTRAVENOUS | Status: DC
  Administered 2013-02-25 – 2013-02-27 (×8): 3.375 g via INTRAVENOUS
  Filled 2013-02-24 (×12): qty 50

## 2013-02-24 MED ORDER — LEVOTHYROXINE SODIUM 150 MCG PO TABS
150.0000 ug | ORAL_TABLET | Freq: Every day | ORAL | Status: DC
  Administered 2013-02-25 – 2013-02-26 (×2): 150 ug via ORAL
  Filled 2013-02-24 (×3): qty 1

## 2013-02-24 MED ORDER — HEPARIN SODIUM (PORCINE) 5000 UNIT/ML IJ SOLN
5000.0000 [IU] | Freq: Three times a day (TID) | INTRAMUSCULAR | Status: DC
  Administered 2013-02-24 – 2013-02-28 (×11): 5000 [IU] via SUBCUTANEOUS
  Filled 2013-02-24 (×14): qty 1

## 2013-02-24 MED ORDER — SODIUM CHLORIDE 0.9 % IV SOLN: 250.0000 mL | INTRAVENOUS | Status: DC | PRN

## 2013-02-24 MED ORDER — SUCCINYLCHOLINE CHLORIDE 20 MG/ML IJ SOLN
100.0000 mg | Freq: Once | INTRAMUSCULAR | Status: AC
  Administered 2013-02-24: 100 mg via INTRAVENOUS

## 2013-02-24 NOTE — ED Notes (Signed)
BS 201

## 2013-02-24 NOTE — ED Notes (Signed)
cbg was 201

## 2013-02-24 NOTE — H&P (Signed)
Name: [name] MRN: [id number] DOB: 12-25-1927    LOS: 0  PCCM RESIDENT ADMISSION NOTE  History of Present Illness: Ms. Rohlman is a 79 yo woman who lives at a nursing home with pmh COPD, hypothyroidism, a-fib, and dysphagia presented after dinner with some "gurggling noise" apart from baseline. Pt was apparently eating dinner and then seemed to be having some respiratory distress along with upper airway noise. Nurse was concerned for aspiration and pt was then intubated in ED 2/2 fatigue from respiratory distress. PCCM called to manage pt.   Lines / Drains: 4/13  ETT>> 4/13 foley >>>  Cultures: 4/13 urine culture>> 4/13 sputum Cx >>> 4/13 BCx >>>  Antibiotics: 4/13 Vanc >> 4/13 Zosyn >>  Tests / Events: 4/13 - intubated 2/2 respiratory distress  Imaging:  4/13 pCXR: hyperinflated lungs, slight cardiomegaly, no frank infiltrates  The patient is sedated, intubated and unable to provide history, which was obtained for available medical records.    Past Medical History  Diagnosis Date  . COPD (chronic obstructive pulmonary disease)   . Hypertension   . Blind   . Hypothyroid   . Pacemaker   . Renal insufficiency   . Finger fracture   . Dementia   . Bipolar disorder   . Dysphagia     needs thick nectar liquids  . Atrial fibrillation   . Pneumonia   . Anxiety   . Diastolic heart failure    Past Surgical History  Procedure Laterality Date  . Insert / replace / remove pacemaker     Prior to Admission medications   Medication Sig Start Date End Date Taking? Authorizing Provider  benztropine (COGENTIN) 0.5 MG tablet Take 0.5 mg by mouth at bedtime.   Yes Historical Provider, MD  beta carotene w/minerals (OCUVITE) tablet Take 1 tablet by mouth daily.   Yes Historical Provider, MD  calcium carbonate (TUMS - DOSED IN MG ELEMENTAL CALCIUM) 500 MG chewable tablet Chew 1 tablet by mouth 3 (three) times daily.     Yes Historical Provider, MD  diltiazem (CARDIZEM) 120 MG  tablet Take 120 mg by mouth daily.     Yes Historical Provider, MD  DULoxetine (CYMBALTA) 30 MG capsule Take 60 mg by mouth daily.    Yes Historical Provider, MD  food thickener (THICK IT) POWD Take 1 Container by mouth as needed. Nectar thick liqiud   Yes Historical Provider, MD  guaiFENesin-dextromethorphan (ROBITUSSIN DM) 100-10 MG/5ML syrup Take 5 mLs by mouth 3 (three) times daily as needed. For cough    Yes Historical Provider, MD  HYDROcodone-acetaminophen (NORCO/VICODIN) 5-325 MG per tablet Take 1 tablet by mouth every 4 (four) hours as needed for pain.   Yes Historical Provider, MD  ipratropium-albuterol (DUONEB) 0.5-2.5 (3) MG/3ML SOLN Take 3 mLs by nebulization every 6 (six) hours as needed.   Yes Historical Provider, MD  lactulose (CHRONULAC) 10 GM/15ML solution Take 10 g by mouth daily.   Yes Historical Provider, MD  levothyroxine (SYNTHROID, LEVOTHROID) 150 MCG tablet Take 150 mcg by mouth daily before breakfast.   Yes Historical Provider, MD  megestrol (MEGACE ES) 625 MG/5ML suspension Take 625 mg by mouth daily.   Yes Historical Provider, MD  Melatonin 3 MG TABS Take 3 mg by mouth at bedtime.   Yes Historical Provider, MD  meloxicam (MOBIC) 7.5 MG tablet Take 7.5 mg by mouth daily.   Yes Historical Provider, MD  miconazole (MICOTIN) 2 % powder Apply topically as needed for itching.   Yes Historical Provider,  MD  phenol (CHLORASEPTIC) 1.4 % LIQD Use as directed 2 sprays in the mouth or throat as needed. Sore throat   Yes Historical Provider, MD  senna (SENOKOT) 8.6 MG TABS Take 1 tablet by mouth 2 (two) times daily.   Yes Historical Provider, MD  acetaminophen (TYLENOL) 325 MG tablet Take 650 mg by mouth every 4 (four) hours as needed. For pain     Historical Provider, MD   Allergies No Known Allergies  Family History History reviewed. No pertinent family history.  Social History  reports that she has never smoked. She does not have any smokeless tobacco history on file. She  reports that she does not use illicit drugs. Her alcohol history is not on file.  Review Of Systems  11 points review of systems is negative with an exception of listed in HPI.  Vital Signs: Temp:  [96.8 F (36 C)] 96.8 F (36 C) (04/13 1945) Pulse Rate:  [64-68] 64 (04/13 1945) Resp:  [17-18] 18 (04/13 1945) BP: (116-153)/(87-91) 153/91 mmHg (04/13 1945) SpO2:  [100 %] 100 % (04/13 1945) FiO2 (%):  [60 %] 60 % (04/13 1900) Weight:  [148 lb (67.132 kg)] 148 lb (67.132 kg) (04/13 1927)    Physical Examination: General:  Sedated , on vent Neuro:  Sedated, RASS- 4 HEENT:  PERRLA, EOMI Neck:  Supple, no lymphadenopathy   Cardiovascular: regular rate, rhythm, no murmur, rubs or gallop  Lungs:  CTAB, no crackles or wheezes, copious yellow secretions from mouth Abdomen:  Soft, non tender , positive bowel sounds, no organomegaly Musculoskeletal:  Diffuse ecchymosis UE and LE Skin:  Warm and dry, very frail   Ventilator settings: Vent Mode:  [-] PRVC FiO2 (%):  [60 %] 60 % Set Rate:  [17 bmp] 17 bmp Vt Set:  [480 mL] 480 mL PEEP:  [5 cmH20] 5 cmH20 Plateau Pressure:  [14 cmH20] 14 cmH20  Recent Labs  02/24/13 1944  PHART 7.211*  PO2ART 171.0*  TCO2 19  HCO3 18.0*   Labs and Imaging:     Recent Labs  02/24/13 2015  WBC 13.9*  NEUTROABS 6.9  HGB 13.5  HCT 40.5  MCV 101.5*  PLT 253     Assessment and Plan: 1. PULMONARY  ASSESSMENT: Acute hypoxemic respiratory failure. Intubated 4/13 PLAN:   PRVC settings Propofol for sedation SBT- 4/14 Coverage for ?aspiration PNA see ID  2. CARDIOVASCULAR  ASSESSMENT: Hx of a-fib rate controlled HR 60-70s currently. on Cardizem and has pacer but not anticoagulated PLAN:  -INR -monitor -Hold parameters for Cardizem :HR <60  3. RENAL  ASSESSMENT: No active issues PLAN:   -monitor  4. GASTROINTESTINAL  ASSESSMENT:  Chronic dysphagia on puree diet at nursing home. On megace and lactulose ?  PLAN:    -SUP -NPO -D/c megace  5. HEMATOLOGIC  ASSESSMENT:  Baseline Hgb 13. Stable.  PLAN:  -cbc in AM -subQ heparin DVT pptx  6. INFECTIOUS  ASSESSMENT:  ? Aspiration pt is high risk PLAN:   -cont Vanc/Zosyn -pancultures pending - lactic acid and pro calcitonin pending   7. ENDOCRINE  ASSESSMENT:  Hx of hypothyroidism (last TSH 41 on 10/13) PLAN:   -consider restarting synthroid once able -repeat TSH -AM cortisol  8. NEUROLOGIC  ASSESSMENT:  Baseline dementia and dysphagia  PLAN:   -CT head -propofol sedation  CLINICAL SUMMARY:  Pt intubated 2/2 respiratory distress, unknown true aspiration event although pt high risk. SBT in AM.   Best practices / Disposition: -->ICU status under PCCM -->  full code -->Heparin for DVT Px -->Protonix for GI Px -->ventilator bundle -->NPO  Christen Bame, MD PGY-1 02/24/2013, 8:39 PM  I have personally obtained a history, examined the patient, evaluated laboratory and imaging results. I discussed with the resident and agree with the plan stated above.  Critical Care Time devoted to patient care services described in this note is 45   minutes.  Overton Mam, MD  Pulmonary and Critical Care Medicine  [facility]  Pager: [phone]

## 2013-02-24 NOTE — ED Provider Notes (Addendum)
History     CSN: [id number]  Arrival date & time 02/24/13  1191   None     Chief Complaint  Patient presents with  . Shortness of Breath    (Consider location/radiation/quality/duration/timing/severity/associated sxs/prior treatment) Patient is a 79 y.o. female presenting with shortness of breath. The history is provided by the nursing home and the EMS personnel. The history is limited by the condition of the patient.  Shortness of Breath Severity:  Severe  patient brought in by EMS from a [place] living nursing facility. The patient apparently was doing fine until she had her dinner which is a were a purared diet after that she was noted to have a rotatory distress I thought she was choking they opened her mouth to sweep nothing was there EMS was called. EMS state patient was verbalizing some initially but time she get here she was much respiratory distress that she could not talk at all. Patient appeared to be in extremis muscles were contracting. Any choking from an upper airway obstruction.  Level V caveat applies due to the patient being in significant distress in immediate medical intervention required.  Past Medical History  Diagnosis Date  . COPD (chronic obstructive pulmonary disease)   . Hypertension   . Blind   . Hypothyroid   . Pacemaker   . Renal insufficiency   . Finger fracture   . Dementia   . Bipolar disorder   . Dysphagia     needs thick nectar liquids  . Atrial fibrillation   . Pneumonia   . Anxiety   . Diastolic heart failure     Past Surgical History  Procedure Laterality Date  . Insert / replace / remove pacemaker      History reviewed. No pertinent family history.  History  Substance Use Topics  . Smoking status: Never Smoker   . Smokeless tobacco: Not on file  . Alcohol Use: Not on file    OB History   Grav Para Term Preterm Abortions TAB SAB Ect Mult Living                  Review of Systems  Unable to perform ROS Respiratory:  Positive for shortness of breath.    level V caveat applies review of systems due to patient being in severe distress needing immediate medical intervention  Allergies  Review of patient's allergies indicates no known allergies.  Home Medications   Current Outpatient Rx  Name  Route  Sig  Dispense  Refill  . benztropine (COGENTIN) 0.5 MG tablet   Oral   Take 0.5 mg by mouth at bedtime.         . beta carotene w/minerals (OCUVITE) tablet   Oral   Take 1 tablet by mouth daily.         . calcium carbonate (TUMS - DOSED IN MG ELEMENTAL CALCIUM) 500 MG chewable tablet   Oral   Chew 1 tablet by mouth 3 (three) times daily.           Marland Kitchen diltiazem (CARDIZEM) 120 MG tablet   Oral   Take 120 mg by mouth daily.           . DULoxetine (CYMBALTA) 30 MG capsule   Oral   Take 60 mg by mouth daily.          . food thickener (THICK IT) POWD   Oral   Take 1 Container by mouth as needed. Nectar thick liqiud         .  guaiFENesin-dextromethorphan (ROBITUSSIN DM) 100-10 MG/5ML syrup   Oral   Take 5 mLs by mouth 3 (three) times daily as needed. For cough          . HYDROcodone-acetaminophen (NORCO/VICODIN) 5-325 MG per tablet   Oral   Take 1 tablet by mouth every 4 (four) hours as needed for pain.         Marland Kitchen ipratropium-albuterol (DUONEB) 0.5-2.5 (3) MG/3ML SOLN   Nebulization   Take 3 mLs by nebulization every 6 (six) hours as needed.         . lactulose (CHRONULAC) 10 GM/15ML solution   Oral   Take 10 g by mouth daily.         Marland Kitchen levothyroxine (SYNTHROID, LEVOTHROID) 150 MCG tablet   Oral   Take 150 mcg by mouth daily before breakfast.         . megestrol (MEGACE ES) 625 MG/5ML suspension   Oral   Take 625 mg by mouth daily.         . Melatonin 3 MG TABS   Oral   Take 3 mg by mouth at bedtime.         . meloxicam (MOBIC) 7.5 MG tablet   Oral   Take 7.5 mg by mouth daily.         . miconazole (MICOTIN) 2 % powder   Topical   Apply topically as  needed for itching.         . phenol (CHLORASEPTIC) 1.4 % LIQD   Mouth/Throat   Use as directed 2 sprays in the mouth or throat as needed. Sore throat         . senna (SENOKOT) 8.6 MG TABS   Oral   Take 1 tablet by mouth 2 (two) times daily.         Marland Kitchen acetaminophen (TYLENOL) 325 MG tablet   Oral   Take 650 mg by mouth every 4 (four) hours as needed. For pain            BP 101/76  Pulse 65  Temp(Src) 98.1 F (36.7 C)  Resp 24  Wt 148 lb (67.132 kg)  BMI 21.85 kg/m2  SpO2 100%  Physical Exam  Nursing note and vitals reviewed. Constitutional: She appears well-developed. She appears distressed.  HENT:  Head: Normocephalic and atraumatic.  Mouth/Throat: Oropharynx is clear and moist.  Eyes: Pupils are equal, round, and reactive to light.  Neck: No tracheal deviation present.  Significant retractions of the upper neck muscles. In respiratory distress appears to have an upper respiratory obstruction.  Cardiovascular: Normal heart sounds and intact distal pulses.   Tachycardic  Pulmonary/Chest: No stridor. She is in respiratory distress.  Patient's chest wall in the left has a pocket for a pacemaker.  Musculoskeletal: Normal range of motion. She exhibits edema.  Neurological:  In significant distress not able to verbalize. Noted to be moving all 4 extremities.  Skin: Skin is warm. She is not diaphoretic. There is erythema.  And cyanosis.    ED Course  Procedures (including critical care time)  Intubation note as per resident.  Labs Reviewed  COMPREHENSIVE METABOLIC PANEL - Abnormal; Notable for the following:    Glucose, Bld 261 (*)    BUN 36 (*)    Creatinine, Ser 1.26 (*)    Albumin 2.9 (*)    Total Bilirubin 0.2 (*)    GFR calc non Af Amer 39 (*)    GFR calc Af Amer 45 (*)    All  other components within normal limits  PRO B NATRIURETIC PEPTIDE - Abnormal; Notable for the following:    Pro B Natriuretic peptide (BNP) 1093.0 (*)    All other components  within normal limits  URINALYSIS, ROUTINE W REFLEX MICROSCOPIC - Abnormal; Notable for the following:    Glucose, UA 250 (*)    Hgb urine dipstick MODERATE (*)    Protein, ur 100 (*)    All other components within normal limits  CBC WITH DIFFERENTIAL - Abnormal; Notable for the following:    WBC 13.9 (*)    MCV 101.5 (*)    Lymphs Abs 5.8 (*)    All other components within normal limits  POCT I-STAT 3, BLOOD GAS (G3+) - Abnormal; Notable for the following:    pH, Arterial 7.211 (*)    pO2, Arterial 171.0 (*)    Bicarbonate 18.0 (*)    Acid-base deficit 10.0 (*)    All other components within normal limits  URINE CULTURE  LIPASE, BLOOD  PROTIME-INR  URINE MICROSCOPIC-ADD ON  BLOOD GAS, ARTERIAL   Dg Chest Portable 1 View  02/24/2013  *RADIOLOGY REPORT*  Clinical Data: ET tube placement.  PORTABLE CHEST - 1 VIEW  Comparison: PA and lateral chest 12/31/2012.  Findings: Endotracheal tube is in place with tip in good position at the level of the clavicular heads.  Left basilar atelectasis appears improved.  Heart size normal.  Post-traumatic change right shoulder noted.  IMPRESSION: ET tube in good position.  No new abnormality.   Original Report Authenticated By: Holley Dexter, M.D.    Results for orders placed during the hospital encounter of 02/24/13  COMPREHENSIVE METABOLIC PANEL      Result Value Range   Sodium 137  135 - 145 mEq/L   Potassium 4.6  3.5 - 5.1 mEq/L   Chloride 104  96 - 112 mEq/L   CO2 21  19 - 32 mEq/L   Glucose, Bld 261 (*) 70 - 99 mg/dL   BUN 36 (*) 6 - 23 mg/dL   Creatinine, Ser 1.61 (*) 0.50 - 1.10 mg/dL   Calcium 8.8  8.4 - 09.6 mg/dL   Total Protein 6.5  6.0 - 8.3 g/dL   Albumin 2.9 (*) 3.5 - 5.2 g/dL   AST 23  0 - 37 U/L   ALT 27  0 - 35 U/L   Alkaline Phosphatase 72  39 - 117 U/L   Total Bilirubin 0.2 (*) 0.3 - 1.2 mg/dL   GFR calc non Af Amer 39 (*) >90 mL/min   GFR calc Af Amer 45 (*) >90 mL/min  LIPASE, BLOOD      Result Value Range    Lipase 51  11 - 59 U/L  PRO B NATRIURETIC PEPTIDE      Result Value Range   Pro B Natriuretic peptide (BNP) 1093.0 (*) 0 - 450 pg/mL  URINALYSIS, ROUTINE W REFLEX MICROSCOPIC      Result Value Range   Color, Urine YELLOW  YELLOW   APPearance CLEAR  CLEAR   Specific Gravity, Urine 1.011  1.005 - 1.030   pH 6.5  5.0 - 8.0   Glucose, UA 250 (*) NEGATIVE mg/dL   Hgb urine dipstick MODERATE (*) NEGATIVE   Bilirubin Urine NEGATIVE  NEGATIVE   Ketones, ur NEGATIVE  NEGATIVE mg/dL   Protein, ur 045 (*) NEGATIVE mg/dL   Urobilinogen, UA 0.2  0.0 - 1.0 mg/dL   Nitrite NEGATIVE  NEGATIVE   Leukocytes, UA NEGATIVE  NEGATIVE  CBC WITH DIFFERENTIAL      Result Value Range   WBC 13.9 (*) 4.0 - 10.5 K/uL   RBC 3.99  3.87 - 5.11 MIL/uL   Hemoglobin 13.5  12.0 - 15.0 g/dL   HCT 82.9  56.2 - 13.0 %   MCV 101.5 (*) 78.0 - 100.0 fL   MCH 33.8  26.0 - 34.0 pg   MCHC 33.3  30.0 - 36.0 g/dL   RDW 86.5  78.4 - 69.6 %   Platelets 253  150 - 400 K/uL   Neutrophils Relative 50  43 - 77 %   Neutro Abs 6.9  1.7 - 7.7 K/uL   Lymphocytes Relative 42  12 - 46 %   Lymphs Abs 5.8 (*) 0.7 - 4.0 K/uL   Monocytes Relative 7  3 - 12 %   Monocytes Absolute 1.0  0.1 - 1.0 K/uL   Eosinophils Relative 1  0 - 5 %   Eosinophils Absolute 0.1  0.0 - 0.7 K/uL   Basophils Relative 1  0 - 1 %   Basophils Absolute 0.1  0.0 - 0.1 K/uL  PROTIME-INR      Result Value Range   Prothrombin Time 13.2  11.6 - 15.2 seconds   INR 1.01  0.00 - 1.49  URINE MICROSCOPIC-ADD ON      Result Value Range   Squamous Epithelial / LPF RARE  RARE   WBC, UA 0-2  <3 WBC/hpf   RBC / HPF 11-20  <3 RBC/hpf  POCT I-STAT 3, BLOOD GAS (G3+)      Result Value Range   pH, Arterial 7.211 (*) 7.350 - 7.450   pCO2 arterial 44.9  35.0 - 45.0 mmHg   pO2, Arterial 171.0 (*) 80.0 - 100.0 mmHg   Bicarbonate 18.0 (*) 20.0 - 24.0 mEq/L   TCO2 19  0 - 100 mmol/L   O2 Saturation 99.0     Acid-base deficit 10.0 (*) 0.0 - 2.0 mmol/L   Patient temperature  98.6 F     Collection site RADIAL, ALLEN'S TEST ACCEPTABLE     Drawn by RT     Sample type ARTERIAL       1. Respiratory failure   2. Airway obstruction, anatomic     CRITICAL CARE Performed by: Shelda Jakes.   Total critical care time: 30  Critical care time was exclusive of separately billable procedures and treating other patients.  Critical care was necessary to treat or prevent imminent or life-threatening deterioration.  Critical care was time spent personally by me on the following activities: development of treatment plan with patient and/or surrogate as well as nursing, discussions with consultants, evaluation of patient's response to treatment, examination of patient, obtaining history from patient or surrogate, ordering and performing treatments and interventions, ordering and review of laboratory studies, ordering and review of radiographic studies, pulse oximetry and re-evaluation of patient's condition.   MDM   The patient presented in respiratory distress. Appeared to be upper airway type obstruction picture. The patient was a taken immediately to the trauma bay preparation for intubation. Intubation procedure was done by the PGY 2 emergency medicine resident Oleh Genin, under my supervision. Patient's hypopharynx area was very floppy her a epiglottis was somewhat draped over. No evidence of any food particles indirect evidence of aspiration. Patient was intubated with a glide a scope 7.0 tube no complications. Patient prior to intubation was hyperventilated with the Ambu bag 100% oxygen now received 3 mg of etomidate 100 mg of succinylcholine.  The patient had significant improvement following the intubation. Improvement in her skin color. Prior to intubation patient was obvious distress lots of retractions of her upper neck muscles was cyanotic in color struggling significantly. Patient will be admitted by the pulmonary critical care team to the ICU. Labs have been  ordered some are still pending. Initial blood gas showed a sniffing and low pH of 7.21. Subsequent electrolytes without significant abnormalities other than some renal insufficiency. Patient's past medical history from [place] living was consistent with dementia some sort of vague description of upper airway obstructive problem. The patient upon presentation was in too much acute distress to obtain full history, review of systems. Patient's chest x-ray without evidence of aspiration pneumonia at this point in time.        Shelda Jakes, MD 02/24/13 2111  Shelda Jakes, MD 02/24/13 2112

## 2013-02-24 NOTE — Progress Notes (Signed)
ANTIBIOTIC CONSULT NOTE - INITIAL  Pharmacy Consult for vanc/zosyn Indication: pneumonia  No Known Allergies  Patient Measurements: Weight: 148 lb (67.132 kg)   Vital Signs: Temp: 98.1 F (36.7 C) (04/13 2045) BP: 101/76 mmHg (04/13 2045) Pulse Rate: 65 (04/13 2045) Intake/Output from previous day:   Intake/Output from this shift: Total I/O In: 1200 [I.V.:1200] Out: 300 [Urine:300]  Labs:  Recent Labs  02/24/13 2015  WBC 13.9*  HGB 13.5  PLT 253  CREATININE 1.26*   The CrCl is unknown because both a height and weight (above a minimum accepted value) are required for this calculation. No results found for this basename: VANCOTROUGH, VANCOPEAK, VANCORANDOM, GENTTROUGH, GENTPEAK, GENTRANDOM, TOBRATROUGH, TOBRAPEAK, TOBRARND, AMIKACINPEAK, AMIKACINTROU, AMIKACIN,  in the last 72 hours   Microbiology: No results found for this or any previous visit (from the past 720 hour(s)).  Medical History: Past Medical History  Diagnosis Date  . COPD (chronic obstructive pulmonary disease)   . Hypertension   . Blind   . Hypothyroid   . Pacemaker   . Renal insufficiency   . Finger fracture   . Dementia   . Bipolar disorder   . Dysphagia     needs thick nectar liquids  . Atrial fibrillation   . Pneumonia   . Anxiety   . Diastolic heart failure     Medications:  Prescriptions prior to admission  Medication Sig Dispense Refill  . benztropine (COGENTIN) 0.5 MG tablet Take 0.5 mg by mouth at bedtime.      . beta carotene w/minerals (OCUVITE) tablet Take 1 tablet by mouth daily.      . calcium carbonate (TUMS - DOSED IN MG ELEMENTAL CALCIUM) 500 MG chewable tablet Chew 1 tablet by mouth 3 (three) times daily.        Marland Kitchen diltiazem (CARDIZEM) 120 MG tablet Take 120 mg by mouth daily.        . DULoxetine (CYMBALTA) 30 MG capsule Take 60 mg by mouth daily.       . food thickener (THICK IT) POWD Take 1 Container by mouth as needed. Nectar thick liqiud      .  guaiFENesin-dextromethorphan (ROBITUSSIN DM) 100-10 MG/5ML syrup Take 5 mLs by mouth 3 (three) times daily as needed. For cough       . HYDROcodone-acetaminophen (NORCO/VICODIN) 5-325 MG per tablet Take 1 tablet by mouth every 4 (four) hours as needed for pain.      Marland Kitchen ipratropium-albuterol (DUONEB) 0.5-2.5 (3) MG/3ML SOLN Take 3 mLs by nebulization every 6 (six) hours as needed.      . lactulose (CHRONULAC) 10 GM/15ML solution Take 10 g by mouth daily.      Marland Kitchen levothyroxine (SYNTHROID, LEVOTHROID) 150 MCG tablet Take 150 mcg by mouth daily before breakfast.      . megestrol (MEGACE ES) 625 MG/5ML suspension Take 625 mg by mouth daily.      . Melatonin 3 MG TABS Take 3 mg by mouth at bedtime.      . meloxicam (MOBIC) 7.5 MG tablet Take 7.5 mg by mouth daily.      . miconazole (MICOTIN) 2 % powder Apply topically as needed for itching.      . phenol (CHLORASEPTIC) 1.4 % LIQD Use as directed 2 sprays in the mouth or throat as needed. Sore throat      . senna (SENOKOT) 8.6 MG TABS Take 1 tablet by mouth 2 (two) times daily.      Marland Kitchen acetaminophen (TYLENOL) 325 MG tablet Take 650  mg by mouth every 4 (four) hours as needed. For pain        Scheduled:  . aspirin  324 mg Oral NOW   Or  . aspirin  300 mg Rectal NOW  . [START ON 02/25/2013] DULoxetine  60 mg Oral Daily  . [COMPLETED] etomidate  0.3 mg/kg Intravenous Once  . heparin  5,000 Units Subcutaneous Q8H  . [START ON 02/25/2013] levothyroxine  150 mcg Oral QAC breakfast  . [COMPLETED] midazolam  5 mg Intravenous Once  . pantoprazole (PROTONIX) IV  40 mg Intravenous QHS  . [COMPLETED] succinylcholine  100 mg Intravenous Once   Assessment: 79 yo who was admitted for suspected PNA due to aspiration. Since she is from a nursing home, empiric coverage with vanc/zosyn. Cultures have been sent.   Goal of Therapy:  Vancomycin trough level 15-20 mcg/ml  Plan:  Vanc 1g IV q24 Zosyn 3.375g IV q8 F/u with level if needed  Ulyses Southward  [place] 02/24/2013,10:10 PM

## 2013-02-24 NOTE — ED Notes (Signed)
Patient intubated size 7 23 at the lip without difficulty

## 2013-02-24 NOTE — Procedures (Signed)
Intubation Procedure Note [name] [id number] 1928-05-05  Procedure: Intubation Indications: Airway protection and maintenance  Procedure Details Consent: Unable to obtain consent because of altered level of consciousness. Time Out: Verified patient identification, verified procedure, site/side was marked, verified correct patient position, special equipment/implants available, medications/allergies/relevent history reviewed, required imaging and test results available.  Performed  Maximum sterile technique was used including gloves and hand hygiene.  MAC and 3    Evaluation Hemodynamic Status: BP stable throughout; O2 sats: stable throughout Patient's Current Condition: stable Complications: No apparent complications Patient did tolerate procedure well. Chest X-ray ordered to verify placement.  CXR: pending.   Inez Pilgrim 02/24/2013

## 2013-02-24 NOTE — ED Notes (Signed)
Patient arrived to POD A via EMS from [facility]. Nurse noticed she was not looking good so they moved her to Trauma B.  Staff stated they were feeding her pureed food and she acted as if she was choking.  Cleaned out her mouth and the "noise" from her throat was different then usual.  Patient was making a gurgling sound and was very restless.  Dr. Ruthy Dick present.

## 2013-02-25 ENCOUNTER — Inpatient Hospital Stay (HOSPITAL_COMMUNITY): Payer: Medicare Other

## 2013-02-25 DIAGNOSIS — J449 Chronic obstructive pulmonary disease, unspecified: Secondary | ICD-10-CM

## 2013-02-25 DIAGNOSIS — R609 Edema, unspecified: Secondary | ICD-10-CM

## 2013-02-25 DIAGNOSIS — G8929 Other chronic pain: Secondary | ICD-10-CM

## 2013-02-25 LAB — GLUCOSE, CAPILLARY
Glucose-Capillary: 76 mg/dL (ref 70–99)
Glucose-Capillary: 89 mg/dL (ref 70–99)
Glucose-Capillary: 83 mg/dL (ref 70–99)
Glucose-Capillary: 126 mg/dL — ABNORMAL HIGH (ref 70–99)

## 2013-02-25 LAB — BASIC METABOLIC PANEL
CO2: 22 mEq/L (ref 19–32)
Calcium: 8 mg/dL — ABNORMAL LOW (ref 8.4–10.5)
GFR calc Af Amer: 66 mL/min — ABNORMAL LOW (ref >90)
GFR calc non Af Amer: 57 mL/min — ABNORMAL LOW (ref >90)
Sodium: 138 mEq/L (ref 135–145)

## 2013-02-25 LAB — CORTISOL: Cortisol, Plasma: 5.8 ug/dL

## 2013-02-25 LAB — POCT I-STAT 3, ART BLOOD GAS (G3+)
Patient temperature: 98.3
TCO2: 23 mmol/L (ref 0–100)
pH, Arterial: 7.428 (ref 7.350–7.450)

## 2013-02-25 LAB — CBC
MCH: 34.5 pg — ABNORMAL HIGH (ref 26.0–34.0)
Platelets: 211 10*3/uL (ref 150–400)
RBC: 3.74 MIL/uL — ABNORMAL LOW (ref 3.87–5.11)

## 2013-02-25 MED ORDER — PRO-STAT SUGAR FREE PO LIQD
30.0000 mL | Freq: Three times a day (TID) | ORAL | Status: DC
  Administered 2013-02-25 – 2013-02-28 (×9): 30 mL
  Filled 2013-02-25 (×12): qty 30

## 2013-02-25 MED ORDER — PROPOFOL 10 MG/ML IV EMUL
5.0000 ug/kg/min | INTRAVENOUS | Status: DC
  Administered 2013-02-25: 10 ug/kg/min via INTRAVENOUS
  Administered 2013-02-26: 5 ug/kg/min via INTRAVENOUS
  Administered 2013-02-27 (×3): 15 ug/kg/min via INTRAVENOUS
  Administered 2013-02-28: 20 ug/kg/min via INTRAVENOUS
  Administered 2013-02-28: 30 ug/kg/min via INTRAVENOUS
  Administered 2013-02-28: 15 ug/kg/min via INTRAVENOUS
  Filled 2013-02-25 (×5): qty 100

## 2013-02-25 MED ORDER — OSMOLITE 1.2 CAL PO LIQD
1000.0000 mL | ORAL | Status: DC
  Filled 2013-02-25 (×2): qty 1000

## 2013-02-25 MED ORDER — IPRATROPIUM BROMIDE 0.02 % IN SOLN
0.5000 mg | RESPIRATORY_TRACT | Status: DC
  Administered 2013-02-25 – 2013-02-28 (×19): 0.5 mg via RESPIRATORY_TRACT
  Filled 2013-02-25 (×19): qty 2.5

## 2013-02-25 MED ORDER — CHLORHEXIDINE GLUCONATE 0.12 % MT SOLN
OROMUCOSAL | Status: AC
  Administered 2013-02-25: 15 mL
  Filled 2013-02-25: qty 15

## 2013-02-25 MED ORDER — FENTANYL CITRATE 0.05 MG/ML IJ SOLN
100.0000 ug | Freq: Once | INTRAMUSCULAR | Status: AC
  Administered 2013-02-25: 100 ug via INTRAVENOUS

## 2013-02-25 MED ORDER — MIDAZOLAM HCL 2 MG/2ML IJ SOLN
2.0000 mg | Freq: Once | INTRAMUSCULAR | Status: AC
  Administered 2013-02-25: 2 mg via INTRAVENOUS

## 2013-02-25 MED ORDER — OSMOLITE 1.2 CAL PO LIQD
1000.0000 mL | ORAL | Status: DC
  Administered 2013-02-27 (×2): 1000 mL
  Filled 2013-02-25 (×4): qty 1000

## 2013-02-25 MED ORDER — FENTANYL CITRATE 0.05 MG/ML IJ SOLN
INTRAMUSCULAR | Status: AC
  Filled 2013-02-25: qty 4

## 2013-02-25 MED ORDER — THIAMINE HCL 100 MG/ML IJ SOLN: 100.0000 mg | Freq: Every day | INTRAMUSCULAR | Status: DC

## 2013-02-25 MED ORDER — DEXAMETHASONE SODIUM PHOSPHATE 4 MG/ML IJ SOLN
8.0000 mg | Freq: Four times a day (QID) | INTRAMUSCULAR | Status: AC
  Administered 2013-02-25 – 2013-02-26 (×4): 8 mg via INTRAVENOUS
  Filled 2013-02-25 (×4): qty 2

## 2013-02-25 MED ORDER — CHLORHEXIDINE GLUCONATE 0.12 % MT SOLN
15.0000 mL | Freq: Two times a day (BID) | OROMUCOSAL | Status: DC
  Administered 2013-02-25 – 2013-02-28 (×6): 15 mL via OROMUCOSAL
  Filled 2013-02-25 (×6): qty 15

## 2013-02-25 MED ORDER — ALBUTEROL SULFATE (5 MG/ML) 0.5% IN NEBU
2.5000 mg | INHALATION_SOLUTION | RESPIRATORY_TRACT | Status: DC
  Administered 2013-02-25 – 2013-02-28 (×19): 2.5 mg via RESPIRATORY_TRACT
  Filled 2013-02-25 (×19): qty 0.5

## 2013-02-25 MED ORDER — ETOMIDATE 2 MG/ML IV SOLN
20.0000 mg | Freq: Once | INTRAVENOUS | Status: AC
  Administered 2013-02-25: 20 mg via INTRAVENOUS

## 2013-02-25 MED ORDER — SODIUM CHLORIDE 0.9 % IV SOLN: 1.0000 mg | Freq: Once | INTRAVENOUS | Status: DC

## 2013-02-25 MED ORDER — MIDAZOLAM HCL 2 MG/2ML IJ SOLN
INTRAMUSCULAR | Status: AC
  Filled 2013-02-25: qty 4

## 2013-02-25 NOTE — Procedures (Signed)
Bronchoscopy Procedure Note [name] [id number] [birth year]  Procedure: Bronchoscopy Indications: Diagnostic evaluation of the airways  Procedure Details Consent: Unable to obtain consent because of emergent medical necessity. Time Out: Verified patient identification, verified procedure, site/side was marked, verified correct patient position, special equipment/implants available, medications/allergies/relevent history reviewed, required imaging and test results available.  Performed  In preparation for procedure, patient was given 100% FiO2 and bronchoscope lubricated. Sedation: Benzodiazepines and Etomidate  Airway entered and the following bronchi were examined: RUL, RML, RLL, LUL and LLL.  Questionable compression of RUL of hypertrophy ring?, lavaged clear back, no endobronchial lesions noted No foreign body  Procedures performed: BAL performed Bronchoscope removed.    Evaluation Hemodynamic Status: BP stable throughout; O2 sats: stable throughout Patient's Current Condition: stable Specimens:  Sent clear secretions Complications: No apparent complications Patient did tolerate procedure well.  CXR pending.   Mcarthur Rossetti. Tyson Alias, MD, FACP Pgr: [phone] [place] [facility]

## 2013-02-25 NOTE — Progress Notes (Addendum)
Name: [name] MRN: [id number] DOB: November 10, 1928    LOS: 1  PCCM RESIDENT Progress NOTE  History of Present Illness: [name] is a 79 yo woman who lives at a nursing home with pmh COPD, hypothyroidism, a-fib, and dysphagia presented after dinner with some "gurggling noise" apart from baseline. Pt was apparently eating dinner and then seemed to be having some respiratory distress along with upper airway noise. Nurse was concerned for aspiration and pt was then intubated in ED 2/2 fatigue from respiratory distress. PCCM called to manage pt.   Lines / Drains: 4/13  ETT>> 4/13 foley >>>  Cultures: 4/13 urine culture>> 4/13 sputum Cx >>> 4/13 BCx >>>  Antibiotics: 4/13 Vanc >> 4/13 Zosyn >>  Tests / Events: 4/13 - intubated 2/2 respiratory distress 4/14- awake, follows commands  Imaging:  4/13 pCXR: hyperinflated lungs, slight cardiomegaly, no frank infiltrates  Vital Signs: Temp:  [96.8 F (36 C)-98.3 F (36.8 C)] 97.6 F (36.4 C) (04/14 0831) Pulse Rate:  [58-70] 60 (04/14 0732) Resp:  [14-26] 16 (04/14 0600) BP: (94-153)/(61-93) 119/80 mmHg (04/14 0600) SpO2:  [100 %] 100 % (04/14 0600) FiO2 (%):  [40 %-100 %] 40 % (04/14 0800) Weight:  [135 lb 9.3 oz (61.5 kg)-148 lb (67.132 kg)] 135 lb 9.3 oz (61.5 kg) (04/13 2200) I/O last 3 completed shifts: In: 1931.5 [I.V.:1656.5; IV Piggyback:275] Out: 785 [Urine:785]  Physical Examination: General:  Sedated , on vent Neuro:  Sedated, RASS 0, follows commands HEENT:  PERRLA, EOMI Cardiovascular: regular rate, rhythm, no murmur, rubs or gallop  Lungs:  CTAB, no crackles or wheezes Abdomen:  Soft, non tender , positive bowel sounds, no organomegaly Musculoskeletal:  Diffuse ecchymosis UE and LE Skin:  Warm and dry, very frail   Ventilator settings: Vent Mode:  [-] PRVC FiO2 (%):  [40 %-100 %] 40 % Set Rate:  [17 bmp] 17 bmp Vt Set:  [480 mL] 480 mL PEEP:  [5 cmH20] 5 cmH20 Plateau Pressure:  [14 cmH20-15 cmH20] 14  cmH20  Recent Labs  02/24/13 1944 02/24/13 2326  PHART 7.211* 7.393  PO2ART 171.0* 184.0*  TCO2 19 21.7  HCO3 18.0* 20.7   Labs and Imaging:     Recent Labs  02/24/13 2015 02/25/13 0322  WBC 13.9* 9.9  NEUTROABS 6.9  --   HGB 13.5 12.9  HCT 40.5 36.8  MCV 101.5* 98.4  PLT 253 211     Assessment and Plan: 1. PULMONARY  ASSESSMENT: Acute hypoxemic respiratory failure. Intubated 4/13 etiology unclear, concern aspiration in setting copd PLAN:   BDer's, no role systemic steroids as of now Propofol for sedation SBT planned as abg reviewed, cpap 5  ps 5 goal 2 hrs Coverage for ?aspiration PNA see ID, repeat pcxr in am for development infiltrates Doppler legs, may need CT chest if etiology not revealed   2. CARDIOVASCULAR  ASSESSMENT: Hx of a-fib rate controlled HR 60-70s currently. on Cardizem and has pacer but not anticoagulated, trop neg PLAN:  -INR 1.01 -monitor -Hold parameters for Cardizem :HR <60 -may ned to liit propofol if brady continues  3. RENAL  ASSESSMENT: Limit gross overload PLAN:   -monitor on kvo  4. GASTROINTESTINAL  ASSESSMENT:  Chronic dysphagia on puree diet at nursing home. On megace and lactulose ?  PLAN:   -SUP -start TF -D/c megace  5. HEMATOLOGIC  ASSESSMENT:  Baseline Hgb 13. Stable.  PLAN:  -cbc in AM -subQ heparin DVT pptx -for doppler legs now r/o dvt / edema  6.  INFECTIOUS  ASSESSMENT:  ? Aspiration pt is high risk PLAN:   -cont Vanc/Zosyn -pancultures pending - lactic acid 2.4 and normal pro calcitonin <0.1, use for neg predictive value, repeat daily x 2 more days  7. ENDOCRINE  ASSESSMENT:  Hx of hypothyroidism (last TSH 41 on 10/13) PLAN:   -consider restarting synthroid once able -repeat TSH -if concerned for adrenal insuff and not on pressors, would need acth stimulation  8. NEUROLOGIC  ASSESSMENT:  Baseline dementia and dysphagia  PLAN:   -CT head neg acute -propofol sedation, wua, awakens ,  cooperative Chair position Avoid benzo  CLINICAL SUMMARY:  Pt intubated 2/2 respiratory distress, unknown true aspiration event although pt high risk. SBT in AM.   PATEL,RAVI, MD   02/25/2013, 8:58 AM  I have personally obtained a history, examined the patient, evaluated laboratory and imaging results. I discussed with the resident and agree with the plan stated above.  Critical Care Time devoted to patient care services described in this note is 35   minutes.  Micah Flesher, MD  Pulmonary and Critical Care Medicine  [facility]  Pager: [phone]

## 2013-02-25 NOTE — Care Management Note (Signed)
    Page 1 of 1   02/27/2013     12:13:23 PM   CARE MANAGEMENT NOTE 02/27/2013  Patient:  [name]   Account Number:  [id number]  Date Initiated:  02/25/2013  Documentation initiated by:  Avie Arenas  Subjective/Objective Assessment:   Admitted with ?? aspiration - intubated.  From SNF - [facility]     Action/Plan:   Anticipated DC Date:  03/04/2013   Anticipated DC Plan:  SKILLED NURSING FACILITY  In-house referral  Clinical Social Worker      DC Planning Services  CM consult      Choice offered to / List presented to:             Status of service:  In process, will continue to follow Medicare Important Message given?   (If response is "NO", the following Medicare IM given date fields will be blank) Date Medicare IM given:   Date Additional Medicare IM given:    Discharge Disposition:    Per UR Regulation:  Reviewed for med. necessity/level of care/duration of stay  If discussed at Long Length of Stay Meetings, dates discussed:    Comments:  ContactTimmy, [name] [phone]                [facility] Daughter [phone]  02-27-13 12noon Avie Arenas, RNBSN [phone] Talked with son today.  Plan for trach on 02-28-13. Discussed with son [facility] options.  Choose [facility].  Udpated that may be as early tomorrow if have bed available and physician approves.  is fine with that. CM will continue to follow for progression/coordination to [facility].  02-25-13 11:40am Avie Arenas, RNBSN [phone] Admitted with ?? aspiration - from [facility] SN[facility].  On vent. SW consult placed.

## 2013-02-25 NOTE — Progress Notes (Signed)
INITIAL NUTRITION ASSESSMENT  DOCUMENTATION CODES Per approved criteria  -Severe malnutrition in the context of chronic illness   INTERVENTION:  Initiate TF via OG tube with Osmolite 1.2 at 20 ml/h, increase by 10 ml every 4 hours to goal rate of 30 ml/h with Prostat 30 ml TID to provide 1164 kcals, 85 gm protein, 590 ml free water daily.  Given history of dysphagia, recommend swallow evaluation prior to initiating PO diet once patient is extubated.  NUTRITION DIAGNOSIS: Inadequate oral intake related to inability to eat as evidenced by NPO status.   Goal: Intake to meet >90% of estimated nutrition needs.  Monitor:  TF tolerance/adequacy, weight trend, labs, vent status.  Reason for Assessment: MD Consult for TF initiation and management.   79 y.o. female  Admitting Dx: Respiratory distress; ? Aspiration  ASSESSMENT: Patient from nursing home. Developed some respiratory distress along with upper airway noise after dinner; required intubation in ED 2/2 fatigue from respiratory distress.  History of dysphagia noted.  Usual diet includes pureed foods and nectar thick liquids.  Nutrition Focused Physical Exam:  Subcutaneous Fat:  Orbital Region: WNL Upper Arm Region: NA Thoracic and Lumbar Region: NA  Muscle:  Temple Region: Mild-moderate depletion Clavicle Bone Region: Severe depletion Clavicle and Acromion Bone Region: severe depletion Scapular Bone Region: NA Dorsal Hand: NA Patellar Region: WNL Anterior Thigh Region: WNL Posterior Calf Region: NA  Edema: NA  Pt meets criteria for severe MALNUTRITION in the context of chronic illness as evidenced by 9% weight loss in 1 week and severe muscle loss.  Patient is currently intubated on ventilator support.  MV: 8.2 Temp:Temp (24hrs), Avg:97.8 F (36.6 C), Min:96.8 F (36 C), Max:98.3 F (36.8 C)  Propofol is off.  Height: Ht Readings from Last 1 Encounters:  02/24/13 5\' 6"  (1.676 m)    Weight: Wt Readings  from Last 1 Encounters:  02/24/13 135 lb 9.3 oz (61.5 kg)    Ideal Body Weight: 59.1 kg  % Ideal Body Weight: 104%  Wt Readings from Last 10 Encounters:  02/24/13 135 lb 9.3 oz (61.5 kg)  02/15/13 148 lb (67.132 kg)  08/16/12 165 lb (74.844 kg)    Usual Body Weight: 148 lb (1 week ago); 165 lb (6 months ago)  % Usual Body Weight: 82-91%  BMI:  Body mass index is 21.89 kg/(m^2).  Estimated Nutritional Needs: Kcal: 1241 Protein: 85-95 gm Fluid: 1.3-1.5 L  Skin: no problems noted  Diet Order: NPO  EDUCATION NEEDS: -Education not appropriate at this time   Intake/Output Summary (Last 24 hours) at 02/25/13 1142 Last data filed at 02/25/13 1045  Gross per 24 hour  Intake 2011.89 ml  Output    950 ml  Net 1061.89 ml    Last BM: 4/13   Labs:   Recent Labs Lab 02/24/13 2015 02/25/13 0322  NA 137 138  K 4.6 4.2  CL 104 106  CO2 21 22  BUN 36* 28*  CREATININE 1.26* 0.91  CALCIUM 8.8 8.0*  MG 2.4  --   GLUCOSE 261* 84    CBG (last 3)   Recent Labs  02/24/13 2347 02/25/13 0344 02/25/13 0807  GLUCAP 142* 86 76    Scheduled Meds: . ipratropium  0.5 mg Nebulization Q4H   And  . albuterol  2.5 mg Nebulization Q4H  . DULoxetine  60 mg Oral Daily  . feeding supplement (OSMOLITE 1.2 CAL)  1,000 mL Per Tube Q24H  . heparin  5,000 Units Subcutaneous Q8H  .  levothyroxine  150 mcg Oral QAC breakfast  . pantoprazole (PROTONIX) IV  40 mg Intravenous QHS  . piperacillin-tazobactam (ZOSYN)  IV  3.375 g Intravenous Q8H  . vancomycin  1,000 mg Intravenous Q24H    Continuous Infusions: . sodium chloride 50 mL/hr at 02/24/13 2120  . propofol Stopped (02/25/13 1045)    Past Medical History  Diagnosis Date  . COPD (chronic obstructive pulmonary disease)   . Hypertension   . Blind   . Hypothyroid   . Pacemaker   . Renal insufficiency   . Finger fracture   . Dementia   . Bipolar disorder   . Dysphagia     needs thick nectar liquids  . Atrial  fibrillation   . Pneumonia   . Anxiety   . Diastolic heart failure     Past Surgical History  Procedure Laterality Date  . Insert / replace / remove pacemaker      Joaquin Courts, RD, LDN, CNSC Pager [phone] After Hours Pager [phone]

## 2013-02-25 NOTE — Procedures (Signed)
Intubation Procedure Note Gladine Plude [id number] 26-Oct-1928  Procedure: Intubation Indications: Respiratory insufficiency  Procedure Details Consent: Unable to obtain consent because of emergent medical necessity. Time Out: Verified patient identification, verified procedure, site/side was marked, verified correct patient position, special equipment/implants available, medications/allergies/relevent history reviewed, required imaging and test results available.  Performed  Maximum sterile technique was used including antiseptics, cap, gloves, hand hygiene and mask.  3 mac blade  Significant cord edema noted on visualization of airway.   Evaluation Hemodynamic Status: BP stable throughout; O2 sats: stable throughout Patient's Current Condition: stable Complications: No apparent complications Patient did tolerate procedure well. Chest X-ray ordered to verify placement.  CXR: pending.  Cord edema noted  Mcarthur Rossetti. Tyson Alias, MD, FACP Pgr: [phone] [facility] [facility]

## 2013-02-25 NOTE — Clinical Social Work Psychosocial (Signed)
     Clinical Social Work Department BRIEF PSYCHOSOCIAL ASSESSMENT 02/25/2013  Patient:  SIONA, COULSTON     Account Number:  [id number]     Admit date:  02/24/2013  Clinical Social Worker:  Margaree Mackintosh  Date/Time:  02/25/2013 12:00 M  Referred by:  Physician  Date Referred:  02/25/2013 Referred for  SNF Placement   Other Referral:   Pt admitted from SNF.   Interview type:  Family Other interview type:   [name]-son    PSYCHOSOCIAL DATA Living Status:  FACILITY Admitted from facility:  [facility] Level of care:  Skilled Nursing Facility Primary support name:  Jacoria Keiffer: [phone] Primary support relationship to patient:  CHILD, ADULT Degree of support available:   Adequate.    CURRENT CONCERNS Current Concerns  Post-Acute Placement   Other Concerns:    SOCIAL WORK ASSESSMENT / PLAN Clinical Social Worker received referral indicating pt is from [facility].  CSW reviewed chart and spoke with pt's son.  CSW introduced self, explained role, and provided support.  CSW provided active listening.  Son confirmed plan is for pt to dc to [facility]. CSW provided information on hospital visiting hours.  CSW to continue to follow and assist as needed.   Assessment/plan status:  Information/Referral to [facility] Other assessment/ plan:   Information/referral to community resources:   [facility] Visiting Hours.    PATIENTS/FAMILYS RESPONSE TO PLAN OF CARE: Pt currently unable to fully participate in assessment due to medical condition.  Son thanked CSW for intervention.

## 2013-02-25 NOTE — Procedures (Signed)
Extubation Procedure Note  Patient Details:   Name: [name] DOB: 01/10/1928 MRN: [id number]   Airway Documentation:     Evaluation  O2 sats: stable throughout Complications: No apparent complications Patient did tolerate procedure well. Bilateral Breath Sounds: Diminished;Rhonchi Suctioning: Airway Yes  Ave Filter 02/25/2013, 1:21 PM

## 2013-02-25 NOTE — Procedures (Cosign Needed)
INTUBATION Performed by: Oleh Genin  Authorized by: Shelda Jakes.  Consent: The procedure was performed in an emergent situation. Patient identity confirmed: arm band  Time out: Immediately prior to procedure a "time out" was called to verify the correct patient, procedure, equipment, support staff and site/side marked as required. Indications: respiratory distress, respiratory failure and airway protection Intubation method: video-assisted Patient status: paralyzed (RSI) Preoxygenation: BVM Sedatives: etomidate Paralytic: succyinylcholine Laryngoscope size: Mac 4 Tube size: 7.0 mm Tube type: cuffed Number of attempts: 1 Cords visualized: yes Post-procedure assessment: chest rise, ETCO2 monitor and CO2 detector  Breath sounds: equal and absent over the epigastrium  Cuff inflated: yes ETT to lip: 24 cm Tube secured with: ETT holder  Chest x-ray findings: endotracheal tube in appropriate position Patient tolerance: Patient tolerated the procedure well with no immediate complications.  Notes: Patient's hypopharynx area was very floppy and her epiglottis was somewhat draped over making positioning for cord view somewhat difficult. No evidence of any food particles or direct evidence of aspiration.   Pt care supervised by Dr. Deretha Emory.

## 2013-02-25 NOTE — Progress Notes (Signed)
Bilateral lower extremity venous duplex:  No evidence of DVT, superficial thrombosis, or Baker's Cyst.   

## 2013-02-26 ENCOUNTER — Inpatient Hospital Stay (HOSPITAL_COMMUNITY): Payer: Medicare Other

## 2013-02-26 LAB — PROCALCITONIN: Procalcitonin: 0.44 ng/mL

## 2013-02-26 LAB — POCT I-STAT 3, ART BLOOD GAS (G3+)
Acid-base deficit: 3 mmol/L — ABNORMAL HIGH (ref 0.0–2.0)
O2 Saturation: 99 %
Patient temperature: 97.1
TCO2: 22 mmol/L (ref 0–100)
pH, Arterial: 7.409 (ref 7.350–7.450)

## 2013-02-26 LAB — GLUCOSE, CAPILLARY: Glucose-Capillary: 119 mg/dL — ABNORMAL HIGH (ref 70–99)

## 2013-02-26 LAB — BASIC METABOLIC PANEL
BUN: 26 mg/dL — ABNORMAL HIGH (ref 6–23)
Calcium: 8.5 mg/dL (ref 8.4–10.5)
GFR calc Af Amer: 89 mL/min — ABNORMAL LOW (ref >90)
GFR calc non Af Amer: 77 mL/min — ABNORMAL LOW (ref >90)
Potassium: 4.2 mEq/L (ref 3.5–5.1)
Sodium: 137 mEq/L (ref 135–145)

## 2013-02-26 MED ORDER — INSULIN ASPART 100 UNIT/ML ~~LOC~~ SOLN
0.0000 [IU] | SUBCUTANEOUS | Status: DC
  Administered 2013-02-26: 1 [IU] via SUBCUTANEOUS
  Administered 2013-02-26 – 2013-02-27 (×3): 2 [IU] via SUBCUTANEOUS
  Administered 2013-02-27 (×2): 1 [IU] via SUBCUTANEOUS

## 2013-02-26 MED ORDER — INSULIN ASPART 100 UNIT/ML ~~LOC~~ SOLN: 0.0000 [IU] | Freq: Every day | SUBCUTANEOUS | Status: DC

## 2013-02-26 MED ORDER — BISACODYL 10 MG RE SUPP
10.0000 mg | Freq: Once | RECTAL | Status: AC
  Administered 2013-02-26: 10 mg via RECTAL
  Filled 2013-02-26: qty 1

## 2013-02-26 MED ORDER — INSULIN ASPART 100 UNIT/ML ~~LOC~~ SOLN: 0.0000 [IU] | Freq: Three times a day (TID) | SUBCUTANEOUS | Status: DC

## 2013-02-26 MED ORDER — LEVOTHYROXINE SODIUM 100 MCG PO TABS
100.0000 ug | ORAL_TABLET | Freq: Every day | ORAL | Status: DC
  Administered 2013-02-27: 100 ug via ORAL
  Filled 2013-02-26 (×2): qty 1

## 2013-02-26 NOTE — Progress Notes (Addendum)
Name: [name] MRN: [id number] DOB: December 17, 1927    LOS: 2  PCCM RESIDENT Progress NOTE  History of Present Illness: Ms. Dias is a 79 yo woman who lives at a nursing home with pmh COPD, hypothyroidism, a-fib, and dysphagia presented after dinner with some "gurggling noise" apart from baseline. Pt was apparently eating dinner and then seemed to be having some respiratory distress along with upper airway noise. Nurse was concerned for aspiration and pt was then intubated in ED 2/2 fatigue from respiratory distress. PCCM called to manage pt.   Lines / Drains: 4/13  ETT>> extubated 4/14>>> re-intubated 4/14 4/13 foley >>>  Cultures: 4/13 urine culture>> 4/13 sputum Cx >>> 4/13 BCx >>>  Antibiotics: 4/13 Vanc >> 4/13 Zosyn >>  Tests / Events: 4/13 - intubated 2/2 respiratory distress 4/14- awake, follows commands. Extubated and re-intubated along with bronch, cord edema dramatic  Imaging:  4/13 pCXR: hyperinflated lungs, slight cardiomegaly, no frank infiltrates  4/14- pCXR: no new infiltrates.  Vital Signs: Temp:  [96.8 F (36 C)-98.3 F (36.8 C)] 98 F (36.7 C) (04/15 0341) Pulse Rate:  [58-98] 60 (04/15 0600) Resp:  [8-29] 18 (04/15 0600) BP: (90-188)/(61-124) 128/80 mmHg (04/15 0600) SpO2:  [99 %-100 %] 100 % (04/15 0600) FiO2 (%):  [40 %] 40 % (04/15 0600) Weight:  [136 lb 7.4 oz (61.9 kg)] 136 lb 7.4 oz (61.9 kg) (04/15 0445) I/O last 3 completed shifts: In: 3493.8 [I.V.:2503.8; NG/GT:390; IV Piggyback:600] Out: 2135 [Urine:2135]  Physical Examination: General:  Alert , on vent Neuro: RASS 0, follows commands HEENT:  PERRLA, EOMI Cardiovascular: regular rate, rhythm, no murmur, rubs or gallop  Lungs:  CTAB, no crackles or wheezes Abdomen:  Soft, non tender , positive bowel sounds, no organomegaly Musculoskeletal:  Diffuse ecchymosis UE and LE Skin:  Warm and dry, very frail   Ventilator settings: Vent Mode:  [-] PRVC FiO2 (%):  [40 %] 40 % Set Rate:   [18 bmp] 18 bmp Vt Set:  [480 mL] 480 mL PEEP:  [5 cmH20] 5 cmH20 Pressure Support:  [5 cmH20] 5 cmH20 Plateau Pressure:  [14 cmH20-20 cmH20] 14 cmH20  Recent Labs  02/24/13 1944 02/24/13 2326 02/25/13 1259  PHART 7.211* 7.393 7.428  PO2ART 171.0* 184.0* 151.0*  TCO2 19 21.7 23  HCO3 18.0* 20.7 21.6   Labs and Imaging:     Recent Labs  02/24/13 2015 02/25/13 0322  WBC 13.9* 9.9  NEUTROABS 6.9  --   HGB 13.5 12.9  HCT 40.5 36.8  MCV 101.5* 98.4  PLT 253 211     Assessment and Plan: 1. PULMONARY  ASSESSMENT: Acute hypoxemic respiratory failure. Intubated 4/13> extubated 4/14 and re-intubated>  etiology unclear, concern aspiration in setting copd. No foreign body on bronch 4/14- vocal cord edema and R bronchial ring hypertrophy.  PLAN:   BDer's, decadron 8 mg q6hr x 4 does  Propofol for sedation SBT and CXR, goal 2 hrs PS weaning Doppler legs, may need CT chest if etiology not revealed - not done yet. Follow bronch aspirate cultures. With such dramatic presentation cord edema , consider trach early abg reviewed, rate reduction to 12, copd ph wnl Leak dailly   2. CARDIOVASCULAR  ASSESSMENT: Hx of a-fib rate controlled HR 60-70s currently. on Cardizem and has pacer but not anticoagulated, trop neg PLAN:  -monitor paced -Hold parameters for Cardizem :HR <60 -may need to lit propofol if brady continues  3. RENAL  ASSESSMENT: Volume overload better. PLAN:   -monitor on kvo  Chem in am   4. GASTROINTESTINAL  ASSESSMENT:  Chronic dysphagia on puree diet at nursing home. On megace and lactulose ?  PLAN:   -SUP -started TF -eval last BM  5. HEMATOLOGIC  ASSESSMENT:  Baseline Hgb 13. Stable.  PLAN:  -cbc in AM -subQ heparin DVT pptx -for doppler legs now r/o dvt / edema  6. INFECTIOUS  ASSESSMENT:  ? Aspiration pt is high risk PLAN:   -cont Zosyn- will likely narrow tomorrow  Dc vanc -pancultures pending - lactic acid 2.4 and normal pro  calcitonin <0.1> 0.44, use for neg predictive value, repeat daily x 2 more days  7. ENDOCRINE  ASSESSMENT:  Hx of hypothyroidism (last TSH 41 on 10/13) PLAN:   - TSH- 0.151- low, reduce 100 - CBG's running high post TF and decadron.  - SSI sens.  8. NEUROLOGIC  ASSESSMENT:  Baseline dementia and dysphagia  PLAN:   -CT head neg acute -propofol sedation, wua, awakens , cooperative Avoid benzo  CLINICAL SUMMARY:  Pt intubated 2/2 respiratory distress, unknown true aspiration event although pt high risk. Sig cord edema, may ned trach exercise today , will update son  PATEL,RAVI, MD   02/26/2013, 8:02 AM  I have personally obtained a history, examined the patient, evaluated laboratory and imaging results. I discussed with the resident and agree with the plan stated above.  Critical Care Time devoted to patient care services described in this note is 30 minutes.  Micah Flesher, MD  Pulmonary and Critical Care Medicine  [facility]  Pager: [phone]

## 2013-02-26 NOTE — Progress Notes (Signed)
SLP Cancellation Note  Patient Details Name: [name] MRN: [id number] DOB: [birth year]   Cancelled treatment:       Reason Eval/Treat Not Completed: Patient not medically ready. Pt reintubated. SLP will sign off. Please reorder when ready.    Kahlee Metivier, Riley Nearing 02/26/2013, 8:11 AM

## 2013-02-26 NOTE — Progress Notes (Signed)
BC drawn on 02/23/13 positive for gram neg rods in aerobic bottle. Resident paged.

## 2013-02-27 ENCOUNTER — Inpatient Hospital Stay (HOSPITAL_COMMUNITY): Payer: Medicare Other

## 2013-02-27 HISTORY — PX: TRACHEOSTOMY: SUR1362

## 2013-02-27 LAB — BLOOD GAS, ARTERIAL
Drawn by: 31101
PEEP: 5 cmH2O
RATE: 12 resp/min
pCO2 arterial: 33 mmHg — ABNORMAL LOW (ref 35.0–45.0)
pH, Arterial: 7.465 — ABNORMAL HIGH (ref 7.350–7.450)
pO2, Arterial: 108 mmHg — ABNORMAL HIGH (ref 80.0–100.0)

## 2013-02-27 LAB — BASIC METABOLIC PANEL
Calcium: 8.4 mg/dL (ref 8.4–10.5)
GFR calc Af Amer: 88 mL/min — ABNORMAL LOW (ref >90)
GFR calc non Af Amer: 76 mL/min — ABNORMAL LOW (ref >90)
Potassium: 4 mEq/L (ref 3.5–5.1)
Sodium: 140 mEq/L (ref 135–145)

## 2013-02-27 LAB — CBC
HCT: 32.7 % — ABNORMAL LOW (ref 36.0–46.0)
Hemoglobin: 11.5 g/dL — ABNORMAL LOW (ref 12.0–15.0)
MCH: 34.6 pg — ABNORMAL HIGH (ref 26.0–34.0)
MCHC: 35.2 g/dL (ref 30.0–36.0)
MCV: 98.5 fL (ref 78.0–100.0)

## 2013-02-27 LAB — GLUCOSE, CAPILLARY

## 2013-02-27 LAB — PROCALCITONIN: Procalcitonin: 0.23 ng/mL

## 2013-02-27 LAB — TRIGLYCERIDES: Triglycerides: 64 mg/dL (ref <150)

## 2013-02-27 MED ORDER — MIDAZOLAM HCL 2 MG/2ML IJ SOLN
4.0000 mg | Freq: Once | INTRAMUSCULAR | Status: DC
  Filled 2013-02-27: qty 4

## 2013-02-27 MED ORDER — VECURONIUM BROMIDE 10 MG IV SOLR
10.0000 mg | Freq: Once | INTRAVENOUS | Status: DC
  Filled 2013-02-27: qty 10

## 2013-02-27 MED ORDER — IOHEXOL 350 MG/ML SOLN
100.0000 mL | Freq: Once | INTRAVENOUS | Status: AC | PRN
  Administered 2013-02-27: 100 mL via INTRAVENOUS

## 2013-02-27 MED ORDER — FENTANYL CITRATE 0.05 MG/ML IJ SOLN
200.0000 ug | Freq: Once | INTRAMUSCULAR | Status: AC
  Administered 2013-02-28: 200 ug via INTRAVENOUS
  Filled 2013-02-27 (×2): qty 4

## 2013-02-27 MED ORDER — LEVOTHYROXINE SODIUM 150 MCG PO TABS
150.0000 ug | ORAL_TABLET | Freq: Every day | ORAL | Status: DC
  Administered 2013-02-28: 150 ug via ORAL
  Filled 2013-02-27 (×2): qty 1

## 2013-02-27 MED ORDER — DEXTROSE 5 % IV SOLN
1.0000 g | INTRAVENOUS | Status: DC
  Administered 2013-02-27 – 2013-02-28 (×2): 1 g via INTRAVENOUS
  Filled 2013-02-27 (×3): qty 10

## 2013-02-27 MED ORDER — ETOMIDATE 2 MG/ML IV SOLN
40.0000 mg | Freq: Once | INTRAVENOUS | Status: DC
  Filled 2013-02-27: qty 20

## 2013-02-27 MED ORDER — PROPOFOL 10 MG/ML IV EMUL
5.0000 ug/kg/min | Freq: Once | INTRAVENOUS | Status: AC
  Administered 2013-02-28: 20 ug/kg/min via INTRAVENOUS

## 2013-02-27 NOTE — Progress Notes (Signed)
Clinical Social Worker staffed case with [employer].  LTAC currently being pursued.  CSW to sign off, please re consult if needed.    Angelia Mould, MSW, [place] [phone]

## 2013-02-27 NOTE — Progress Notes (Addendum)
Name: Brighton Delio MRN: [id number] DOB: [birth year]    LOS: 3  PCCM RESIDENT Progress NOTE  History of Present Illness: Ms. Dowson is a 79 yo woman who lives at a nursing home with pmh COPD, hypothyroidism, a-fib, and dysphagia presented after dinner with some "gurggling noise" apart from baseline. Pt was apparently eating dinner and then seemed to be having some respiratory distress along with upper airway noise. Nurse was concerned for aspiration and pt was then intubated in ED 2/2 fatigue from respiratory distress. PCCM called to manage pt.   Lines / Drains: 4/13  ETT>> extubated 4/14>>> re-intubated 4/14 4/13 foley >>>  Cultures: 4/13 urine culture>>neg 4/13 sputum Cx >>>Neg 4/13 BCx >>>Neg 4/14 Tracheal aspirate>>> no organisms  Antibiotics: 4/13 Vanc >>4/15 4/13 Zosyn >>>4/16 4/16 ceftraixone>>>4/18  Tests / Events: 4/13 - intubated 2/2 respiratory distress 4/14- awake, follows commands. Extubated and re-intubated along with bronch, cord edema dramatic 4/15- still intubated and on propofol. 4/16- no leak  Imaging:  4/13 pCXR: hyperinflated lungs, slight cardiomegaly, no frank infiltrates  4/14- pCXR: no new infiltrates. 4/15- PCXR- no new infiltrates or fluid overload.  Vital Signs: Temp:  [97.4 F (36.3 C)-98.4 F (36.9 C)] 98.4 F (36.9 C) (04/16 0400) Pulse Rate:  [59-74] 59 (04/16 0700) Resp:  [11-26] 21 (04/16 0700) BP: (95-143)/(60-110) 125/70 mmHg (04/16 0700) SpO2:  [99 %-100 %] 100 % (04/16 0700) FiO2 (%):  [40 %] 40 % (04/16 0600) Weight:  [142 lb 6.7 oz (64.6 kg)] 142 lb 6.7 oz (64.6 kg) (04/16 0400) I/O last 3 completed shifts: In: 2808 [I.V.:1283; NG/GT:1150; IV Piggyback:375] Out: 1510 [Urine:1510]  Physical Examination: General:  Alert , on vent Neuro: RASS 0, follows commands HEENT:  PERRLA, EOMI, no leak Cardiovascular: regular rate, rhythm, no murmur, rubs or gallop  Lungs:  CTAB, no crackles or wheezes Abdomen:  Soft, non tender ,  positive bowel sounds, no organomegaly Musculoskeletal:  Diffuse ecchymosis UE and LE Skin:  Warm and dry, very frail   Ventilator settings: Vent Mode:  [-] PRVC FiO2 (%):  [40 %] 40 % Set Rate:  [12 bmp-18 bmp] 12 bmp Vt Set:  [480 mL] 480 mL PEEP:  [5 cmH20] 5 cmH20 Pressure Support:  [5 cmH20] 5 cmH20 Plateau Pressure:  [9 cmH20-18 cmH20] 17 cmH20  Recent Labs  02/25/13 1259 02/26/13 0849 02/27/13 0258  PHART 7.428 7.409 7.465*  PO2ART 151.0* 155.0* 108.0*  TCO2 23 22 24.4  HCO3 21.6 20.9 23.4   Labs and Imaging:     Recent Labs  02/24/13 2015 02/25/13 0322 02/27/13 0340  WBC 13.9* 9.9 12.0*  NEUTROABS 6.9  --   --   HGB 13.5 12.9 11.5*  HCT 40.5 36.8 32.7*  MCV 101.5* 98.4 98.5  PLT 253 211 197     Assessment and Plan: 1. PULMONARY  ASSESSMENT: Acute hypoxemic respiratory failure. Intubated 4/13> extubated 4/14 and re-intubated>  etiology unclear, concern aspiration in setting copd. No foreign body on bronch 4/14- vocal cord edema and R bronchial ring hypertrophy.  PLAN:   BDer's Propofol for sedation with wua SBT and CXR, goal 2 hrs PS weaning, goal ps 5 Doppler legs neg for DVT - lowers suspicion PE Bronch aspirate culture- no organisms. With such dramatic presentation cord edema , consider trach early especialy as no leak x 2 days  abg - reviewed. Mildly alkalotic. Reduce T V to 7 cc/kg Leak dailly Ct chest  Assess mass extrensic compression, neck mass  2. CARDIOVASCULAR  ASSESSMENT: Hx of  a-fib rate controlled HR 60-70s currently. on Cardizem and has pacer but not anticoagulated, trop neg PLAN:  -monitor paced -Hold parameters for Cardizem :HR <60 -may need to stop propofol if brady continues  3. RENAL  ASSESSMENT: Volume overload better. Cr 0.77. At baseline. PLAN:   -monitor on kvo  4. GASTROINTESTINAL  ASSESSMENT:  Chronic dysphagia on puree diet at nursing home. On megace and lactulose ?  PLAN:   -SUP -started TF, no res -eval  last BM -npo for trach likely  5. HEMATOLOGIC  ASSESSMENT:  Hgb 11.5< 13. No overt bleeding, low clinical suspicion PE PLAN:  -cbc in AM -subQ heparin DVT pptx -No DVT on dopplers.  6. INFECTIOUS  ASSESSMENT:  ? Aspiration pt is high risk, no defined infiltartes PLAN:   -cont Zosyn- will likely narrow today to ceftriaxone, add stop date -pancultures negative till dated. - lactic acid 2.4 and normal pro calcitonin <0.1> 0.44> 0.23, use for neg predictive value.  7. ENDOCRINE  ASSESSMENT:  Hx of hypothyroidism (last TSH 41 on 10/13) PLAN:   - TSH- 0.151- low, reduced synthroid 100 - CBG's running high post TF.  - SSI sens.  8. NEUROLOGIC  ASSESSMENT:  Baseline dementia and dysphagia  PLAN:   -CT head neg acute -propofol sedation, wua, awakens , cooperative Avoid benzo  CLINICAL SUMMARY:  Pt intubated 2/2 respiratory distress, unknown true aspiration event although pt high risk. Sig cord edema, may ned trach exercise today , will update son  PATEL,RAVI, MD   02/27/2013, 7:55 AM  I have personally obtained a history, examined the patient, evaluated laboratory and imaging results. I discussed with the resident and agree with the plan stated above.  Critical Care Time devoted to patient care services described in this note is 30 minutes.  Micah Flesher, MD  Pulmonary and Critical Care Medicine  [facility]  Pager: [phone]

## 2013-02-28 ENCOUNTER — Inpatient Hospital Stay
Admission: AD | Admit: 2013-02-28 | Discharge: 2013-04-02 | Disposition: A | Discharge status: Skilled Nursing Facility | Payer: Medicare Other | Source: Ambulatory Visit | Attending: Internal Medicine | Admitting: Internal Medicine

## 2013-02-28 ENCOUNTER — Inpatient Hospital Stay (HOSPITAL_COMMUNITY): Payer: Medicare Other

## 2013-02-28 ENCOUNTER — Other Ambulatory Visit (HOSPITAL_COMMUNITY): Payer: Self-pay

## 2013-02-28 DIAGNOSIS — J9600 Acute respiratory failure, unspecified whether with hypoxia or hypercapnia: Secondary | ICD-10-CM

## 2013-02-28 DIAGNOSIS — J9620 Acute and chronic respiratory failure, unspecified whether with hypoxia or hypercapnia: Secondary | ICD-10-CM

## 2013-02-28 DIAGNOSIS — F319 Bipolar disorder, unspecified: Secondary | ICD-10-CM

## 2013-02-28 DIAGNOSIS — Z930 Tracheostomy status: Secondary | ICD-10-CM

## 2013-02-28 DIAGNOSIS — J690 Pneumonitis due to inhalation of food and vomit: Secondary | ICD-10-CM

## 2013-02-28 DIAGNOSIS — J988 Other specified respiratory disorders: Secondary | ICD-10-CM

## 2013-02-28 LAB — BLOOD GAS, ARTERIAL
Acid-Base Excess: 3.8 mmol/L — ABNORMAL HIGH (ref 0.0–2.0)
Bicarbonate: 27 mEq/L — ABNORMAL HIGH (ref 20.0–24.0)
FIO2: 0.4 %
O2 Saturation: 97.6 %
RATE: 12 resp/min
pO2, Arterial: 83.7 mmHg (ref 80.0–100.0)

## 2013-02-28 LAB — PROTIME-INR
INR: 0.99 (ref 0.00–1.49)
Prothrombin Time: 13 seconds (ref 11.6–15.2)

## 2013-02-28 LAB — URINE CULTURE: Colony Count: 45000

## 2013-02-28 LAB — CBC
HCT: 33.8 % — ABNORMAL LOW (ref 36.0–46.0)
Hemoglobin: 11.7 g/dL — ABNORMAL LOW (ref 12.0–15.0)
MCV: 99.1 fL (ref 78.0–100.0)
RDW: 14.1 % (ref 11.5–15.5)
WBC: 11.5 10*3/uL — ABNORMAL HIGH (ref 4.0–10.5)

## 2013-02-28 LAB — GLUCOSE, CAPILLARY
Glucose-Capillary: 98 mg/dL (ref 70–99)
Glucose-Capillary: 95 mg/dL (ref 70–99)
Glucose-Capillary: 83 mg/dL (ref 70–99)

## 2013-02-28 LAB — CULTURE, RESPIRATORY W GRAM STAIN

## 2013-02-28 MED ORDER — MIDAZOLAM HCL 2 MG/2ML IJ SOLN
INTRAMUSCULAR | Status: AC
  Filled 2013-02-28: qty 4

## 2013-02-28 MED ORDER — MIDAZOLAM HCL 2 MG/2ML IJ SOLN
4.0000 mg | Freq: Once | INTRAMUSCULAR | Status: AC
  Administered 2013-02-28: 4 mg via INTRAVENOUS

## 2013-02-28 MED ORDER — FENTANYL CITRATE 0.05 MG/ML IJ SOLN
100.0000 ug | Freq: Once | INTRAMUSCULAR | Status: AC
  Administered 2013-02-28: 100 ug via INTRAVENOUS

## 2013-02-28 MED ORDER — MIDAZOLAM BOLUS VIA INFUSION: 1.0000 mg | INTRAVENOUS | Status: DC | PRN

## 2013-02-28 MED ORDER — FENTANYL CITRATE 0.05 MG/ML IJ SOLN: 25.0000 ug | INTRAMUSCULAR | Status: DC | PRN

## 2013-02-28 NOTE — Progress Notes (Signed)
Name: [name] MRN: [id number] DOB: 1928/07/06    LOS: 4  PCCM Progress NOTE  History of Present Illness: Ms. Boughner is a 79 yo woman who lives at a nursing home with pmh COPD, hypothyroidism, a-fib, and dysphagia presented after dinner with some "gurggling noise" apart from baseline. Pt was apparently eating dinner and then seemed to be having some respiratory distress along with upper airway noise. Nurse was concerned for aspiration and pt was then intubated in ED 2/2 fatigue from respiratory distress. PCCM called to manage pt.   Lines / Drains: 4/13  ETT>> extubated 4/14>>> re-intubated 4/14 4/13 foley >>>  Cultures: 4/13 urine culture>>45,000 E.Coli. Pan sensitive. 4/13 sputum Cx >>>Neg 4/13 BCx >>>Neg 4/14 Tracheal aspirate>>> no organisms  Antibiotics: 4/13 Vanc >>4/15 4/13 Zosyn >>>4/16 4/16 ceftraixone>>>4/18  Tests / Events: 4/13 - intubated 2/2 respiratory distress 4/14- awake, follows commands. Extubated and re-intubated along with bronch, cord edema dramatic 4/15- still intubated and on propofol. 4/16- no leak 4/17- trach planned  Imaging:  4/13 pCXR: hyperinflated lungs, slight cardiomegaly, no frank infiltrates  4/14- pCXR: no new infiltrates. 4/15- PCXR- no new infiltrates or fluid overload. 4/16: CT chest and neck- no PE, no tracheal or bronchial mass lesion. Vocal cord inflammation. 4/17: pcxr: no new infiltrates.  Vital Signs: Temp:  [98 F (36.7 C)-98.7 F (37.1 C)] 98.6 F (37 C) (04/17 0700) Pulse Rate:  [58-82] 59 (04/17 0700) Resp:  [10-22] 13 (04/17 0700) BP: (102-141)/(66-94) 125/78 mmHg (04/17 0700) SpO2:  [99 %-100 %] 100 % (04/17 0700) FiO2 (%):  [40 %] 40 % (04/17 0700) Weight:  [139 lb 5.3 oz (63.2 kg)] 139 lb 5.3 oz (63.2 kg) (04/17 0452) I/O last 3 completed shifts: In: 2040.2 [I.V.:917.7; NG/GT:1010; IV Piggyback:112.5] Out: 1403 [Urine:1403]  Physical Examination: General:  Alert , on vent Neuro: RASS 0, follows  commands HEENT:  PERRLA, EOMI, Cardiovascular: regular rate, rhythm, no murmur, rubs or gallop  Lungs:  CTAB, no crackles or wheezes Abdomen:  Soft, non tender , positive bowel sounds, no organomegaly Skin:  Warm and dry, very frail   Ventilator settings: Vent Mode:  [-] PRVC FiO2 (%):  [40 %] 40 % Set Rate:  [12 bmp] 12 bmp Vt Set:  [480 mL] 480 mL PEEP:  [5 cmH20] 5 cmH20 Plateau Pressure:  [10 cmH20-17 cmH20] 12 cmH20  Recent Labs  02/25/13 1259 02/26/13 0849 02/27/13 0258  PHART 7.428 7.409 7.465*  PO2ART 151.0* 155.0* 108.0*  TCO2 23 22 24.4  HCO3 21.6 20.9 23.4   Labs and Imaging:     Recent Labs  02/27/13 0340 02/28/13 0345  WBC 12.0* 11.5*  HGB 11.5* 11.7*  HCT 32.7* 33.8*  MCV 98.5 99.1  PLT 197 195     Assessment and Plan: 1. PULMONARY  ASSESSMENT: Acute hypoxemic respiratory failure. Intubated 4/13> extubated 4/14 and re-intubated>  etiology unclear, concern aspiration in setting copd. No foreign body on bronch 4/14- vocal cord edema and R bronchial ring hypertrophy.  PLAN:   BDer's Propofol for sedation with wua Doppler legs and CT angio neg for DVT. Neg ct  Bronch aspirate culture- no organisms. CT chest and neck as above. Neg for PE or any tracheal, bronchial, neck lesions. Trach planned Will need ent to assess tracheal lesions, chronic asp? Ca? Wean to trach collar s/p trach as upper airway was etiology thought for resp failure  2. CARDIOVASCULAR  ASSESSMENT: Hx of a-fib rate controlled HR 60-70s currently. on Cardizem and has pacer but not anticoagulated,  trop neg PLAN:  -monitor paced -Hold parameters for Cardizem :HR <60 -may need to stop propofol if brady continues  3. RENAL  ASSESSMENT: Volume overload better. Cr 0.77. At baseline. PLAN:   -monitor on kvo  4. GASTROINTESTINAL  ASSESSMENT:  Chronic dysphagia on puree diet at nursing home. On megace and lactulose ?  PLAN:   -SUP -started TF, no res -npo for trach. - large  BM- will check c.diff pcr Likely needs peg, eval over days prior with trach collar likely and no vent dependence likely  5. HEMATOLOGIC  ASSESSMENT:  Hgb 11.7<11.5< 13. WBC. 11.5<12<9.9<13.9 PLAN:  -cbc in AM -subQ heparin DVT pptx -No DVT on dopplers and CT angio.  6. INFECTIOUS  ASSESSMENT:  ? Aspiration pt is high risk, no defined infiltartes PLAN:   -Narrowed yesterday to ceftriaxone. Stop date 4/18. -pancultures negative till dated- urine shows 45K colonies of E.Coli, pan-sensitive. - C.diff PCR pending.  7. ENDOCRINE  ASSESSMENT:  Hx of hypothyroidism (last TSH 41 on 10/13) PLAN:   - TSH- 0.151- but recently reduced, keep 150  - CBG's running high post TF.  - SSI sens.  8. NEUROLOGIC  ASSESSMENT:  Baseline dementia and dysphagia  PLAN:   -CT head neg acute -propofol sedation, wua, awakens , cooperative Avoid benzo  CLINICAL SUMMARY:  Pt intubated 2/2 respiratory distress, unknown true aspiration event although pt high risk. Sig cord edema, trach today.  PATEL,RAVI, MD   02/28/2013, 8:22 AM  I have personally obtained a history, examined the patient, evaluated laboratory and imaging results. I discussed with the resident and agree with the plan stated above.  Critical Care Time devoted to patient care services described in this note is 30 minutes.  Micah Flesher, MD  Pulmonary and Critical Care Medicine  [facility]  Pager: [phone]

## 2013-02-28 NOTE — Progress Notes (Signed)
SLP Cancellation Note  Patient Details Name: Katisha Shimizu MRN: [id number] DOB: September 18, 1928   Cancelled treatment:       Reason Eval/Treat Not Completed: Patient not medically ready. Pt with tracheotomy  today. Not yet ready for PMSV, has d/c note to [facility]. Will defer PMSV eval and treatment to SLP at that setting.    Tyneka Scafidi, Riley Nearing 02/28/2013, 1:34 PM

## 2013-02-28 NOTE — Procedures (Signed)
Bronchoscopy Procedure Note  [name] is a 79 y.o. female   Indications: Visualization for trach tube placement   Consent: Risks of procedure as well as the alternatives and risks of each were explained to the patient or surrogate. Consent for procedure obtained.   Time Out performed: Verified patient identification, verified procedure, site/side was marked, verified correct patient position, special equipment/implants available, medications/allergies/relevent history reviewed, required imaging and test results available.   Sedation: As noted per trach tube procedure note   Procedure: After adequate sedation and topical anesthesia, the bronchoscope was introduced via the ETT. ETT was retracted in 1-2 cm increments to approx 17 or 18 cm at the incisors. In this position, visual guidance was provided for tracheostomy tube placement. No airway trauma was observed and there was no significant bleeding. After placement, the scope was passed through the tracheostomy tube. Again, there was no evidence of airway trauma or bleeding. A cursory airway exam revealed mild mucoid secretions which were successfully removed with suctioning. There were no endobronchial tumors, masses or foreign bodies noted. There were changes of granulation tissue in the upper trachea probably due to prior intubation.   Complications:  There were no complications noted. The patient tolerated the procedure well.   Specimens:  None   Impression:  Mild upper airway granulation likely due to intubation Successful placement of trach tube without trauma or bleeding   Recommendations:  Consider ENT eval prior to decannulation   Billy Fischer, MD;  PCCM service; Mobile [phone]

## 2013-02-28 NOTE — Procedures (Signed)
Bedside Tracheostomy Insertion Procedure Note   Patient Details:   Name: [name] DOB: 1928/05/17 MRN: [id number]  Procedure: Tracheostomy  Pre Procedure Assessment: ET Tube Size:7.5 ET Tube secured at lip (cm):23 Bite block in place: Yes Breath Sounds: Clear  Post Procedure Assessment: BP 106/72  Pulse 60  Temp(Src) 98.2 F (36.8 C) (Core (Comment))  Resp 12  Ht 5\' 6"  (1.676 m)  Wt 139 lb 5.3 oz (63.2 kg)  BMI 22.5 kg/m2  SpO2 100% O2 sats: stable throughout Complications: No apparent complications Patient did tolerate procedure well Tracheostomy Brand:Shiley Tracheostomy Style:Cuffed Tracheostomy Size: 6.0 Tracheostomy Secured WUJ:[id number] Tracheostomy Placement Confirmation:Trach cuff visualized and in place and Chest X ray ordered for placement    Leonard Downing 02/28/2013, 1:36 PM

## 2013-02-28 NOTE — Discharge Summary (Signed)
Physician Discharge Summary     Patient ID: [name] MRN: [id number] DOB/AGE: 01-22-1928 79 y.o.  Admit date: 02/24/2013 Discharge date: 02/28/2013  Admission Diagnoses:  Discharge Diagnoses:  Principal Problem:   Acute respiratory failure Active Problems:   Dementia   Pacemaker   Hypothyroidism   COPD (chronic obstructive pulmonary disease)   Dysphagia   Tracheostomy in place   Aspiration pneumonia   Significant Hospital tests/ studies/ interventions and procedures  Lines / Drains:  4/13 ETT>> extubated 4/14>>> re-intubated 4/14  4/13 foley >>>  Cultures:  4/13 urine culture>>45,000 E.Coli. Pan sensitive.  4/13 sputum Cx >>>Neg  4/13 BCx >>>Neg  4/14 Tracheal aspirate>>> no organisms  Antibiotics:  4/13 Vanc >>4/15  4/13 Zosyn >>>4/16  4/16 ceftraixone>>>4/18  Tests / Events:  4/13 - intubated 2/2 respiratory distress  4/14- awake, follows commands. Extubated and re-intubated along with bronch, cord edema dramatic  4/15- still intubated and on propofol.  4/16- no leak  4/17-  Imaging:  4/13 pCXR: hyperinflated lungs, slight cardiomegaly, no frank infiltrates  4/14- pCXR: no new infiltrates.  4/15- PCXR- no new infiltrates or fluid overload.  4/16: CT chest and neck- no PE, no tracheal or bronchial mass lesion. Vocal cord inflammation.  4/17: pcxr: no new infiltrates. Tracheostomy.   Hospital Course:  [name] is a 79 yo woman who lives at a nursing home with pmh COPD, hypothyroidism, a-fib, and dysphagia presented after dinner with some "gurggling noise" apart from baseline. Pt was apparently eating dinner and then seemed to be having some respiratory distress along with upper airway noise. Nurse was concerned for aspiration and pt was then intubated in ED 2/2 fatigue from respiratory distress. PCCM called to manage pt.  While in hospital, pt was intubated for Acute respiratory failure. She was extubated the next day but needed to be reintubated due to  similar problems which brought her to the hospital. Bronchoscopy showed vocal cord edema and R bronchial ring hypertrophy, likely from chronic aspiration or to r/o mass- which needs to be biopsied per ENT sooner or later. Tracheostomy was performed on 02/28/13 to help patient recover from the acute event associated with her dysphagia and dementia. - Possible Aspiration PNA will be treated with ceftriaxone to complete 7 day course. - PE ruled out per dopplers and CTA. - Might need further eval for chronic dysphagia and possible PEG tube. Patient is to be transferred to [facility] for further care.   Discharge Exam: BP 92/58  Pulse 60  Temp(Src) 98.5 F (36.9 C) (Core (Comment))  Resp 12  Ht 5\' 6"  (1.676 m)  Wt 139 lb 5.3 oz (63.2 kg)  BMI 22.5 kg/m2  SpO2 100%  Gen: Alert , on vent  Neuro: RASS 0, follows commands  HEENT: PERRLA, EOMI, Trach in place.  Cardiovascular: regular rate, rhythm, no murmur, rubs or gallop  Lungs: CTAB, no crackles or wheezes  Abdomen: Soft, non tender , positive bowel sounds, no organomegaly. Flexiseal in place. Skin: Warm and dry, very frail    Labs at discharge Lab Results  Component Value Date   CREATININE 0.77 02/27/2013   BUN 31* 02/27/2013   NA 140 02/27/2013   K 4.0 02/27/2013   CL 107 02/27/2013   CO2 25 02/27/2013   Lab Results  Component Value Date   WBC 11.5* 02/28/2013   HGB 11.7* 02/28/2013   HCT 33.8* 02/28/2013   MCV 99.1 02/28/2013   PLT 195 02/28/2013   Lab Results  Component Value Date  ALT 27 02/24/2013   AST 23 02/24/2013   ALKPHOS 72 02/24/2013   BILITOT 0.2* 02/24/2013   Lab Results  Component Value Date   INR 0.99 02/28/2013   INR 1.01 02/24/2013   INR 0.97 11/21/2011    Current radiology studies Ct Soft Tissue Neck W Contrast  02/27/2013  *RADIOLOGY REPORT*  Clinical Data: Acute hypoxic respiratory failure.  Intubated, extubated and reintubated.  Concern for aspiration.  Vocal cord edema.  CT NECK WITH CONTRAST   Technique:  Multidetector CT imaging of the neck was performed with intravenous contrast.  Contrast: OMNIPAQUE IOHEXOL 350 MG/ML SOLN  Comparison: Cervical spine CT 08/15/2012.  Chest CT performed same date dictated separately.  Findings: The patient is intubated with nasogastric tube in place. Tips not included on present exam.  Rotation of head to the right with slight distortion of alignment of neck structures.  No neck masses identified.  No obvious injury to the tracheal cartilage or cricoid cartilage. Arytenoid cartilage poorly delineated.  Slightly poor definition of fat planes surrounding vocal cords may represent mild inflammation/ result of irritation.  Carotid bifurcation calcifications without high-grade stenosis.  Calcification inferior to the diminutive size thyroid gland questionable etiology.  Visualized intracranial structures and orbital structures unremarkable.  Prominent venous plexus the medial to the left pterygoid musculature felt to be an incidental finding.  Lung apices clear.  No bony destructive lesion.  IMPRESSION: No obvious injury to the tracheal cartilage or cricoid cartilage. Arytenoid cartilage poorly delineated.  Slightly poor definition of fat planes surrounding vocal cords may represent mild inflammation/ result of mild irritation.   Original Report Authenticated By: Lacy Duverney, M.D.    Ct Angio Chest Pe W/cm &/or Wo Cm  02/27/2013  *RADIOLOGY REPORT*  Clinical Data: Hypoxemia. Pulmonary embolism.  Respiratory failure. Question mass on the intubation.  CT ANGIOGRAPHY CHEST  Technique:  Multidetector CT imaging of the chest using the standard protocol during bolus administration of intravenous contrast. Multiplanar reconstructed images including MIPs were obtained and reviewed to evaluate the vascular anatomy.  Contrast: OMNIPAQUE IOHEXOL 350 MG/ML SOLN  Comparison: Chest radiograph 02/27/2013.  Chest CT 06/06/2011.  Findings: Endotracheal tube is present.  The  cuff distends the trachea at the level of the thoracic inlet.  Nasogastric tube is present doubled back upon itself with the tip at the gastroesophageal junction. No airspace disease is present in the lungs.  This study is technically adequate for evaluation of pulmonary embolus.  No pulmonary embolism is present.  The aorta shows atherosclerosis.  No gross acute aortic abnormality.  There is no tracheal mass lesion identified.  No axillary adenopathy.  Anterior subluxation of the right humeral head relative to the glenoid may be positional.  No pleural effusion.  Dependent atelectasis in the lungs.  Emphysema.  Stable 3 mm ground-glass attenuation nodule in the right middle lobe (image number 60 series 7).  No aggressive osseous lesions.  Healed right rib fractures are present, some of which are new compared to the prior study.  Heterotopic ossification around the coracoclavicular ligament.  The central airways appear within normal limits.  Right mainstem bronchus appears within normal limits.  There is some motion artifact along the right mainstem bronchus.  IMPRESSION: 1.  Endotracheal tube balloon distending the subglottic trachea. 2.  Nasogastric tube doubled back on itself with the tip at the gastroesophageal junction.  Consider adjustment. 3.  Emphysema. 4.  Negative for pulmonary embolus or acute aortic abnormality. No tracheal or bronchial mass lesion.  Original Report Authenticated By: Andreas Newport, M.D.    Dg Chest Port 1 View  02/28/2013  *RADIOLOGY REPORT*  Clinical Data: Intubation, evaluate ET tube position  PORTABLE CHEST - 1 VIEW  Comparison: Portable exam 0529 hours compared to 02/27/2013  Findings: Tip of endotracheal tube 2.6 cm above carina. Nasogastric tube extends into stomach. Left subclavian sequential transvenous pacemaker leads project at right atrium and right ventricle. Normal heart size and pulmonary vascularity. Tortuous aorta. Underlying emphysematous changes with minimal  atelectasis or infiltrate at right base little changed. Remaining lungs clear. No gross pleural effusion or pneumothorax. Bones demineralized. Chronic disruption of right AC joint.  IMPRESSION: Satisfactory endotracheal tube position. Emphysematous changes with minimal atelectasis or infiltrate at the right base. No significant interval change.   Original Report Authenticated By: Ulyses Southward, M.D.    Dg Chest Port 1 View  02/27/2013  *RADIOLOGY REPORT*  Clinical Data: Endotracheal tube, shortness of breath.  PORTABLE CHEST - 1 VIEW  Comparison: 02/26/2013.  Findings: Endotracheal tube terminates approximately 1.3 cm above the carina.  Nasogastric tube is followed into the stomach.  Left subclavian pacemaker lead tips project over the right atrium and right ventricle.  Heart size stable.  There is bibasilar air space disease, left greater than right, with interval worsening on the left.  Probable small bilateral pleural effusions.  Old bilateral rib fractures.  IMPRESSION:  1.  Endotracheal tube is low lying.  Retracting approximately 2 cm would better position the tip above the carina. 2.  Increasing bibasilar air space disease, left greater than right. 3.  Probable small bilateral pleural effusions.   Original Report Authenticated By: Leanna Battles, M.D.     Disposition:  01-Home or Self Care     Medication List    ASK your doctor about these medications       acetaminophen 325 MG tablet  Commonly known as:  TYLENOL  Take 650 mg by mouth every 4 (four) hours as needed. For pain     benztropine 0.5 MG tablet  Commonly known as:  COGENTIN  Take 0.5 mg by mouth at bedtime.     beta carotene w/minerals tablet  Take 1 tablet by mouth daily.     calcium carbonate 500 MG chewable tablet  Commonly known as:  TUMS - dosed in mg elemental calcium  Chew 1 tablet by mouth 3 (three) times daily.     diltiazem 120 MG tablet  Commonly known as:  CARDIZEM  Take 120 mg by mouth daily.      DULoxetine 30 MG capsule  Commonly known as:  CYMBALTA  Take 60 mg by mouth daily.     food thickener Powd  Commonly known as:  THICK IT  Take 1 Container by mouth as needed. Nectar thick liqiud     guaiFENesin-dextromethorphan 100-10 MG/5ML syrup  Commonly known as:  ROBITUSSIN DM  Take 5 mLs by mouth 3 (three) times daily as needed. For cough     HYDROcodone-acetaminophen 5-325 MG per tablet  Commonly known as:  NORCO/VICODIN  Take 1 tablet by mouth every 4 (four) hours as needed for pain.     ipratropium-albuterol 0.5-2.5 (3) MG/3ML Soln  Commonly known as:  DUONEB  Take 3 mLs by nebulization every 6 (six) hours as needed.     lactulose 10 GM/15ML solution  Commonly known as:  CHRONULAC  Take 10 g by mouth daily.     levothyroxine 150 MCG tablet  Commonly known as:  SYNTHROID, LEVOTHROID  Take  150 mcg by mouth daily before breakfast.     megestrol 625 MG/5ML suspension  Commonly known as:  MEGACE ES  Take 625 mg by mouth daily.     Melatonin 3 MG Tabs  Take 3 mg by mouth at bedtime.     meloxicam 7.5 MG tablet  Commonly known as:  MOBIC  Take 7.5 mg by mouth daily.     miconazole 2 % powder  Commonly known as:  MICOTIN  Apply topically as needed for itching.     phenol 1.4 % Liqd  Commonly known as:  CHLORASEPTIC  Use as directed 2 sprays in the mouth or throat as needed. Sore throat     senna 8.6 MG Tabs  Commonly known as:  SENOKOT  Take 1 tablet by mouth 2 (two) times daily.         Discharged Condition: fair Lateka Rady  12:49 PM  Physician Statement:   The Patient was personally examined, the discharge assessment and plan has been personally reviewed and I agree with ACNP Babcock's assessment and plan. > 30 minutes of time have been dedicated to discharge assessment, planning and discharge instructions.   Signed:

## 2013-03-01 LAB — COMPREHENSIVE METABOLIC PANEL
BUN: 33 mg/dL — ABNORMAL HIGH (ref 6–23)
CO2: 27 mEq/L (ref 19–32)
Calcium: 8.5 mg/dL (ref 8.4–10.5)
Creatinine, Ser: 0.7 mg/dL (ref 0.50–1.10)
GFR calc Af Amer: >90 mL/min (ref >90)
GFR calc non Af Amer: 79 mL/min — ABNORMAL LOW (ref >90)
Glucose, Bld: 115 mg/dL — ABNORMAL HIGH (ref 70–99)

## 2013-03-01 LAB — PRO B NATRIURETIC PEPTIDE: Pro B Natriuretic peptide (BNP): 1485 pg/mL — ABNORMAL HIGH (ref 0–450)

## 2013-03-01 LAB — CBC
Hemoglobin: 11.3 g/dL — ABNORMAL LOW (ref 12.0–15.0)
MCH: 34 pg (ref 26.0–34.0)
MCV: 98.2 fL (ref 78.0–100.0)
RBC: 3.32 MIL/uL — ABNORMAL LOW (ref 3.87–5.11)

## 2013-03-01 LAB — MAGNESIUM: Magnesium: 2.1 mg/dL (ref 1.5–2.5)

## 2013-03-01 NOTE — Discharge Summary (Signed)
Will need ENT eval for bx lesions a few cm post cords on tracheal wall Ca vs aspiration inflammation Also ent could re eval cords To trach collar  Mcarthur Rossetti. Tyson Alias, MD, FACP Pgr: [phone] [facility]

## 2013-03-01 NOTE — Op Note (Signed)
[name], [name]              ACCOUNT NO.:  [id number]  MEDICAL RECORD NO.:  [id number]  LOCATION:  2106                         FACILITY:  [facility]  PHYSICIAN:  Nelda Bucks, MD DATE OF BIRTH:  1928-09-05  DATE OF PROCEDURE: DATE OF DISCHARGE:  02/28/2013                              OPERATIVE REPORT   PROCEDURE:  Percutaneous tracheostomy.  [facility], room 2106.  PREOP DIAGNOSIS:  Upper airway edema status post re-intubation in a dramatic fashion with respiratory failure.  POSTOPERATIVE DIAGNOSIS:  Status post tracheostomy secondary to upper airway obstruction, rule out tracheal chronic aspiration findings versus cancer.  BRONCHOSCOPIST:  Oley Balm. Simonds, MD  DESCRIPTION OF PROCEDURE:  The patient was placed in supine position. Consent was obtained from the patient's medical power of attorney, son, fully aware of risks and benefits of procedure including infection, pneumothorax, bleeding and death.  This patient after being placed in supine position.  Chlorhexidine preparation was used to sterilize the operative site.  The endotracheal tube was backed up to approximately 17 cm.  Lidocaine 6 mL with epinephrine was injected over the operative site.  A 1 cm vertical incision made over 2nd endotracheal space dissection made down the tracheal planes, we noted the strap muscles.  No lesions noted.  No vascular anomalies noted.  The 18-gauge needle over white catheter sheath was placed in the airway and the needle was removed.  A wire was placed through the white catheter sheath is all directly visualized on the posterior wall injury.  A 14-French punch dilator was placed over the wire removed and progressive Rhino dilator over a glider was placed over the wire and removed.  Then, a size 6 tracheostomy over 26-French dilator was placed over the glidewire into the airway.  I then removed except for the tracheostomy.  The tracheostomy was sutured in  place with 4-0 monofilament sutures. Blood loss during the procedure less than 1 mL.  The patient tolerated it quite well.  Postoperative chest x-ray revealed, well placed tracheostomy, and in no apparent complication. This patient can followup in our Percutaneous Tracheostomy Clinic at [facility], you can call [phone] for appointment followup.     Nelda Bucks, MD     DJF/MEDQ  D:  02/28/2013  T:  03/01/2013  Job:  [id number]

## 2013-03-03 ENCOUNTER — Other Ambulatory Visit (HOSPITAL_COMMUNITY): Payer: Self-pay

## 2013-03-03 LAB — BASIC METABOLIC PANEL
CO2: 27 mEq/L (ref 19–32)
Calcium: 8.8 mg/dL (ref 8.4–10.5)
Creatinine, Ser: 0.78 mg/dL (ref 0.50–1.10)

## 2013-03-03 LAB — CULTURE, BLOOD (ROUTINE X 2): Culture: NO GROWTH

## 2013-03-03 LAB — CBC
MCH: 34.6 pg — ABNORMAL HIGH (ref 26.0–34.0)
MCV: 98.5 fL (ref 78.0–100.0)
Platelets: 185 10*3/uL (ref 150–400)
RBC: 3.35 MIL/uL — ABNORMAL LOW (ref 3.87–5.11)
RDW: 14.6 % (ref 11.5–15.5)

## 2013-03-04 DIAGNOSIS — J9620 Acute and chronic respiratory failure, unspecified whether with hypoxia or hypercapnia: Secondary | ICD-10-CM

## 2013-03-04 DIAGNOSIS — J9600 Acute respiratory failure, unspecified whether with hypoxia or hypercapnia: Secondary | ICD-10-CM

## 2013-03-04 DIAGNOSIS — Z930 Tracheostomy status: Secondary | ICD-10-CM

## 2013-03-04 LAB — BASIC METABOLIC PANEL
Calcium: 9.2 mg/dL (ref 8.4–10.5)
Creatinine, Ser: 0.69 mg/dL (ref 0.50–1.10)
GFR calc non Af Amer: 79 mL/min — ABNORMAL LOW (ref >90)
Glucose, Bld: 107 mg/dL — ABNORMAL HIGH (ref 70–99)
Sodium: 138 mEq/L (ref 135–145)

## 2013-03-04 NOTE — Consult Note (Signed)
PULMONARY  / CRITICAL CARE MEDICINE  Name: [name] MRN: [id number] DOB: 1928-10-12    ADMISSION DATE:  02/28/2013 CONSULTATION DATE: 4-21  REFERRING MD : [facility] PRIMARY SERVICE:  [facility]  CHIEF COMPLAINT: VDRF   Significant Hospital tests/ studies/ interventions and procedures  Lines / Drains:  4/13 ETT>> extubated 4/14>>> re-intubated 4/14  4/13 foley >>>  4-17 perq trach df>>   History of Present Illness: [name] is a 79 yo woman who lives at a nursing home with pmh COPD, hypothyroidism, a-fib, and dysphagia presented after dinner with some "gurggling noise" apart from baseline. Pt was apparently eating dinner and then seemed to be having some respiratory distress along with upper airway noise. Nurse was concerned for aspiration and pt was then intubated in ED 2/2 fatigue from respiratory distress. PCCM called to manage pt.      SUBJECTIVE:  NAD VITAL SIGNS:   Vital signs reviewed. Abnormal values will appear under impression plan section.   Past Medical History   Diagnosis  Date   .  COPD (chronic obstructive pulmonary disease)    .  Hypertension    .  Blind    .  Hypothyroid    .  Pacemaker    .  Renal insufficiency    .  Finger fracture    .  Dementia    .  Bipolar disorder    .  Dysphagia      needs thick nectar liquids   .  Atrial fibrillation    .  Pneumonia    .  Anxiety    .  Diastolic heart failure     Past Surgical History   Procedure  Laterality  Date   .  Insert / replace / remove pacemaker      Prior to Admission medications   Medication  Sig  Start Date  End Date  Taking?  Authorizing Provider   benztropine (COGENTIN) 0.5 MG tablet  Take 0.5 mg by mouth at bedtime.    Yes  Historical Provider, MD   beta carotene w/minerals (OCUVITE) tablet  Take 1 tablet by mouth daily.    Yes  Historical Provider, MD   calcium carbonate (TUMS - DOSED IN MG ELEMENTAL CALCIUM) 500 MG chewable tablet  Chew 1 tablet by mouth 3 (three) times daily.    Yes   Historical Provider, MD   diltiazem (CARDIZEM) 120 MG tablet  Take 120 mg by mouth daily.    Yes  Historical Provider, MD   DULoxetine (CYMBALTA) 30 MG capsule  Take 60 mg by mouth daily.    Yes  Historical Provider, MD   food thickener (THICK IT) POWD  Take 1 Container by mouth as needed. Nectar thick liqiud    Yes  Historical Provider, MD   guaiFENesin-dextromethorphan (ROBITUSSIN DM) 100-10 MG/5ML syrup  Take 5 mLs by mouth 3 (three) times daily as needed. For cough    Yes  Historical Provider, MD   HYDROcodone-acetaminophen (NORCO/VICODIN) 5-325 MG per tablet  Take 1 tablet by mouth every 4 (four) hours as needed for pain.    Yes  Historical Provider, MD   ipratropium-albuterol (DUONEB) 0.5-2.5 (3) MG/3ML SOLN  Take 3 mLs by nebulization every 6 (six) hours as needed.    Yes  Historical Provider, MD   lactulose (CHRONULAC) 10 GM/15ML solution  Take 10 g by mouth daily.    Yes  Historical Provider, MD   levothyroxine (SYNTHROID, LEVOTHROID) 150 MCG tablet  Take 150 mcg by mouth daily  before breakfast.    Yes  Historical Provider, MD   megestrol (MEGACE ES) 625 MG/5ML suspension  Take 625 mg by mouth daily.    Yes  Historical Provider, MD   Melatonin 3 MG TABS  Take 3 mg by mouth at bedtime.    Yes  Historical Provider, MD   meloxicam (MOBIC) 7.5 MG tablet  Take 7.5 mg by mouth daily.    Yes  Historical Provider, MD   miconazole (MICOTIN) 2 % powder  Apply topically as needed for itching.    Yes  Historical Provider, MD   phenol (CHLORASEPTIC) 1.4 % LIQD  Use as directed 2 sprays in the mouth or throat as needed. Sore throat    Yes  Historical Provider, MD   senna (SENOKOT) 8.6 MG TABS  Take 1 tablet by mouth 2 (two) times daily.    Yes  Historical Provider, MD   acetaminophen (TYLENOL) 325 MG tablet  Take 650 mg by mouth every 4 (four) hours as needed. For pain     Historical Provider, MD   Allergies  No Known Allergies  Family History  History reviewed. No pertinent family history.  Social  History  reports that she has never smoked. She does not have any smokeless tobacco history on file. She reports that she does not use illicit drugs. Her alcohol history is not on file.  Review Of Systems 11 points review of systems is negative with an exception of listed in HPI.      PHYSICAL EXAMINATION:  General: Alert , on vent  Neuro: RASS 0, follows commands  HEENT: PERRLA, EOMI,  Cardiovascular: regular rate, rhythm, no murmur, rubs or gallop  Lungs: CTAB, no crackles or wheezes  Abdomen: Soft, non tender , positive bowel sounds, no organomegaly  Skin: Warm and dry, very frail     Recent Labs Lab 03/01/13 0528 03/03/13 0651 03/04/13 0535  NA 140 139 138  K 3.6 4.6 4.5  CL 106 104 104  CO2 27 27 25   BUN 33* 47* 50*  CREATININE 0.70 0.78 0.69  GLUCOSE 115* 133* 107*    Recent Labs Lab 03/01/13 0528 03/03/13 0651  HGB 11.3* 11.6*  HCT 32.6* 33.0*  WBC 7.8 9.2  PLT 189 185   Dg Chest Port 1 View  03/03/2013  *RADIOLOGY REPORT*  Clinical Data: Respiratory failure.  Evaluate airspace disease.  PORTABLE CHEST - 1 VIEW  Comparison: 02/28/2013  Findings: Cardiomegaly with bibasilar opacities, likely atelectasis.  Hyperinflation of the lungs compatible with COPD.  No visible effusions.  Multiple old healed bilateral rib fractures.  Support devices including tracheostomy tube are unchanged.  IMPRESSION: COPD, cardiomegaly.  Bibasilar atelectasis.   Original Report Authenticated By: Charlett Nose, M.D.     ASSESSMENT / PLAN:  1. PULMONARY  ASSESSMENT: Acute hypoxemic respiratory failure. Intubated 4/13> extubated 4/14 and re-intubated> trached 4-17 PLAN:  Wean per protocol 2. CARDIOVASCULAR  ASSESSMENT: Hx of a-fib rate controlled HR 60-70s currently. on Cardizem and has pacer but not anticoagulated, trop neg  PLAN:  -per ssh 3. RENAL  ASSESSMENT: Volume overload better. Cr 0.77. At baseline.  PLAN:  -per ssh  4. GASTROINTESTINAL  ASSESSMENT: Chronic dysphagia on  puree diet at nursing home. On megace and lactulose ?  PLAN:  -per ssh  5. HEMATOLOGIC  ASSESSMENT: Hgb 11.7<11.5< 13. WBC. 11.5<12<9.9<13.9  PLAN:  -per ssh  -No DVT on dopplers and CT angio.  6. INFECTIOUS  ASSESSMENT: ? Aspiration pt is high risk, no defined infiltartes  PLAN:  -per ssh.  7. ENDOCRINE  ASSESSMENT: Hx of hypothyroidism (last TSH 41 on 10/13)  PLAN:  - per ssh 8. NEUROLOGIC  ASSESSMENT: Baseline dementia and dysphagia  PLAN:  -per ssh  PS trials at 8 hours goal today.  Progress with weaning.  Alyson Reedy, M.D. Pulmonary and Critical Care Medicine [facility] Pager: [phone]  03/04/2013, 10:32 AM

## 2013-03-06 LAB — BASIC METABOLIC PANEL
Chloride: 104 mEq/L (ref 96–112)
Creatinine, Ser: 0.73 mg/dL (ref 0.50–1.10)
GFR calc Af Amer: 90 mL/min — ABNORMAL LOW (ref >90)

## 2013-03-06 LAB — CBC
MCV: 98.9 fL (ref 78.0–100.0)
Platelets: 223 10*3/uL (ref 150–400)
RDW: 14.7 % (ref 11.5–15.5)
WBC: 11.5 10*3/uL — ABNORMAL HIGH (ref 4.0–10.5)

## 2013-03-06 NOTE — Consult Note (Signed)
PULMONARY  / CRITICAL CARE MEDICINE  Name: [name] MRN: [id number] DOB: 05-12-1928    ADMISSION DATE:  02/28/2013 CONSULTATION DATE: 4-21  REFERRING MD : [facility] PRIMARY SERVICE:  [facility]  CHIEF COMPLAINT: VDRF   Significant Hospital tests/ studies/ interventions and procedures  Lines / Drains:  4/13 ETT>> extubated 4/14>>> re-intubated 4/14  4/13 foley >>>  4-17 perq trach df>>   History of Present Illness: Ms. [name] is a 79 yo woman who lives at a nursing home with pmh COPD, hypothyroidism, a-fib, and dysphagia presented after dinner with some "gurggling noise" apart from baseline. Pt was apparently eating dinner and then seemed to be having some respiratory distress along with upper airway noise. Nurse was concerned for aspiration and pt was then intubated in ED 2/2 fatigue from respiratory distress. PCCM called to manage pt.      SUBJECTIVE:  NAD VITAL SIGNS:   Vital signs reviewed. Abnormal values will appear under impression plan section.   40% 5 peep PS10 with VT 480 rr 27      PHYSICAL EXAMINATION:  General: Alert , on vent  Neuro: RASS 0, follows commands  HEENT: PERRLA, EOMI,  Cardiovascular: regular rate, rhythm, no murmur, rubs or gallop  Lungs: CTAB, no crackles or wheezes  Abdomen: Soft, non tender , positive bowel sounds, no organomegaly  Skin: Warm and dry, very frail     Recent Labs Lab 03/03/13 0651 03/04/13 0535 03/06/13 0500  NA 139 138 142  K 4.6 4.5 4.2  CL 104 104 104  CO2 27 25 27   BUN 47* 50* 62*  CREATININE 0.78 0.69 0.73  GLUCOSE 133* 107* 122*    Recent Labs Lab 03/01/13 0528 03/03/13 0651 03/06/13 0500  HGB 11.3* 11.6* 12.6  HCT 32.6* 33.0* 36.7  WBC 7.8 9.2 11.5*  PLT 189 185 223   No results found.  ASSESSMENT / PLAN:  1. PULMONARY  ASSESSMENT: Acute hypoxemic respiratory failure. Intubated 4/13> extubated 4/14 and re-intubated> trached 4-17 PLAN:  Wean per protocol 2. CARDIOVASCULAR  ASSESSMENT: Hx of  a-fib rate controlled HR 60-70s currently. on Cardizem and has pacer but not anticoagulated, trop neg  PLAN:  -per ssh 3. RENAL  ASSESSMENT: Volume overload better. Cr 0.77. At baseline.  PLAN:  -per ssh  4. GASTROINTESTINAL  ASSESSMENT: Chronic dysphagia on puree diet at nursing home. On megace and lactulose ?  PLAN:  -per ssh  5. HEMATOLOGIC  ASSESSMENT: Hgb 11.7<11.5< 13. WBC. 11.5<12<9.9<13.9  PLAN:  -per ssh  -No DVT on dopplers and CT angio.  6. INFECTIOUS  ASSESSMENT: ? Aspiration pt is high risk, no defined infiltartes  PLAN:  -per ssh.  7. ENDOCRINE  ASSESSMENT: Hx of hypothyroidism (last TSH 41 on 10/13)  PLAN:  - per ssh 8. NEUROLOGIC  ASSESSMENT: Baseline dementia and dysphagia  PLAN:  -per ssh   Levy Pupa, MD, PhD 03/06/2013, 11:49 AM [facility] [phone] or if no answer [phone]

## 2013-03-07 ENCOUNTER — Other Ambulatory Visit (HOSPITAL_COMMUNITY): Payer: Self-pay

## 2013-03-07 LAB — CBC WITH DIFFERENTIAL/PLATELET
Basophils Relative: 0 % (ref 0–1)
Eosinophils Absolute: 0.1 10*3/uL (ref 0.0–0.7)
Eosinophils Relative: 1 % (ref 0–5)
HCT: 34.4 % — ABNORMAL LOW (ref 36.0–46.0)
Hemoglobin: 11.9 g/dL — ABNORMAL LOW (ref 12.0–15.0)
MCH: 35.2 pg — ABNORMAL HIGH (ref 26.0–34.0)
MCHC: 34.6 g/dL (ref 30.0–36.0)
MCV: 101.8 fL — ABNORMAL HIGH (ref 78.0–100.0)
Monocytes Absolute: 0.9 10*3/uL (ref 0.1–1.0)
Monocytes Relative: 8 % (ref 3–12)

## 2013-03-07 LAB — COMPREHENSIVE METABOLIC PANEL
ALT: 16 U/L (ref 0–35)
AST: 18 U/L (ref 0–37)
CO2: 28 mEq/L (ref 19–32)
Calcium: 9.3 mg/dL (ref 8.4–10.5)
GFR calc non Af Amer: 77 mL/min — ABNORMAL LOW (ref >90)
Sodium: 143 mEq/L (ref 135–145)
Total Protein: 6.5 g/dL (ref 6.0–8.3)

## 2013-03-08 ENCOUNTER — Other Ambulatory Visit (HOSPITAL_COMMUNITY): Payer: Self-pay

## 2013-03-08 LAB — URINALYSIS, ROUTINE W REFLEX MICROSCOPIC
Bilirubin Urine: NEGATIVE
Protein, ur: NEGATIVE mg/dL
Urobilinogen, UA: 0.2 mg/dL (ref 0.0–1.0)

## 2013-03-08 LAB — CLOSTRIDIUM DIFFICILE BY PCR: Toxigenic C. Difficile by PCR: NEGATIVE

## 2013-03-08 LAB — URINE MICROSCOPIC-ADD ON

## 2013-03-08 NOTE — Progress Notes (Signed)
PULMONARY  / CRITICAL CARE MEDICINE  Name: [name] MRN: [id number] DOB: Sep 01, 1928    ADMISSION DATE:  02/28/2013 CONSULTATION DATE: 4-21  REFERRING MD : [facility] PRIMARY SERVICE:  [facility]  CHIEF COMPLAINT: VDRF   Significant Hospital tests/ studies/ interventions and procedures  Lines / Drains:  4/13 ETT>> extubated 4/14>>> re-intubated 4/14 >> 4/17 4/13 foley >>>  4-17 perq trach df, currently #6 4/25>>   History of Present Illness: [name] is a 79 yo woman who lives at a nursing home with pmh COPD, hypothyroidism, a-fib, and dysphagia presented after dinner with some "gurggling noise" apart from baseline. Pt was apparently eating dinner and then seemed to be having some respiratory distress along with upper airway noise. Nurse was concerned for aspiration and pt was then intubated in ED 2/2 fatigue from respiratory distress. PCCM called to manage pt.   SUBJECTIVE:  NAD  VITAL SIGNS:   Vital signs reviewed. Abnormal values will appear under impression plan section.   40% on ATC   PHYSICAL EXAMINATION:  General: Alert , on vent  Neuro: RASS 0, follows commands  HEENT: PERRLA, EOMI,  Cardiovascular: regular rate, rhythm, no murmur, rubs or gallop  Lungs: CTAB, no crackles or wheezes  Abdomen: Soft, non tender , positive bowel sounds, no organomegaly  Skin: Warm and dry, very frail     Recent Labs Lab 03/04/13 0535 03/06/13 0500 03/07/13 0700  NA 138 142 143  K 4.5 4.2 4.3  CL 104 104 106  CO2 25 27 28   BUN 50* 62* 64*  CREATININE 0.69 0.73 0.75  GLUCOSE 107* 122* 117*    Recent Labs Lab 03/03/13 0651 03/06/13 0500 03/07/13 0700  HGB 11.6* 12.6 11.9*  HCT 33.0* 36.7 34.4*  WBC 9.2 11.5* 11.5*  PLT 185 223 212   Dg Chest Port 1 View  03/08/2013  *RADIOLOGY REPORT*  Clinical Data: Tracheostomy.  Ventilated patient. Assess tracheostomy.  PORTABLE CHEST - 1 VIEW  Comparison: 03/07/2013.  Findings: Unchanged support apparatus with tracheostomy and  weighted tip feeding tube.  Feeding tube tip is in the proximal stomach which is distended with gas.  The pacemaker appears similar.  The heart and lungs are similar to the prior.  Tortuous thoracic aorta. No airspace disease or effusion in the lungs.  IMPRESSION: No interval change.  Stable support apparatus in good position.   Original Report Authenticated By: Andreas Newport, M.D.    Dg Chest Port 1 View  03/07/2013  *RADIOLOGY REPORT*  Clinical Data: Respiratory failure  PORTABLE CHEST - 1 VIEW  Comparison: 03/04/2039  Findings: A left subclavian dual lead pacer, feeding tube and tracheostomy tube are unchanged in appearance. Underlying COPD is noted.  Heart and mediastinal contours remain within normal limits. The lung fields demonstrate coarseness of the interstitial markings compatible with underlying bronchitic change.  Some improvement in aeration at the left lung base has occurred with minimal persistent bibasilar volume loss identified.  Bony structures demonstrate old healed fractures of the right clavicle and multiple right-sided ribs.  IMPRESSION: Some improvement in left basilar aeration.  Otherwise unchanged   Original Report Authenticated By: Rhodia Albright, M.D.     ASSESSMENT / PLAN:  1. PULMONARY  ASSESSMENT: Acute hypoxemic respiratory failure. Intubated 4/13> extubated 4/14 and re-intubated> trached 4-17 PLAN:  Wean per protocol, plan 4h ATC today 4/25 2. CARDIOVASCULAR  ASSESSMENT: Hx of a-fib rate controlled HR 60-70s currently. on Cardizem and has pacer but not anticoagulated, trop neg  PLAN:  -per ssh 3.  RENAL  ASSESSMENT: Volume overload better. Cr 0.77. At baseline.  PLAN:  -per ssh  4. GASTROINTESTINAL  ASSESSMENT: Chronic dysphagia on puree diet at nursing home. On megace and lactulose ?  PLAN:  -per ssh  5. HEMATOLOGIC  ASSESSMENT: Hgb 11.7<11.5< 13. WBC. 11.5<12<9.9<13.9  PLAN:  -per ssh  -No DVT on dopplers and CT angio.  6. INFECTIOUS  ASSESSMENT: ?  Aspiration pt is high risk, no defined infiltrates  PLAN:  -per ssh.  7. ENDOCRINE  ASSESSMENT: Hx of hypothyroidism (last TSH 41 on 10/13)  PLAN:  - per ssh 8. NEUROLOGIC  ASSESSMENT: Baseline dementia and dysphagia  PLAN:  -per ssh   Levy Pupa, MD, PhD 03/08/2013, 12:14 PM [facility] [phone] or if no answer [phone]

## 2013-03-08 NOTE — Progress Notes (Signed)
Patient ID: [name], female   DOB: Apr 09, 1928, 79 y.o.   MRN: [id number] Request received for placement of a percutaneous gastrostomy tube placement on pt with hx chronic dysphagia, dementia,VDRF with trach, prior asp PNA and vocal cord edema. Additional PMH as below. Pt currently with NG tube and unremarkable abd film. Exam: chest- distant BS and sl dim left base; heart- RRR; abd- soft,+BS,ND,NT; ext- no edema.  VSS;temp 99                                  PMH: copd, hypothyroidism, afib, dementia, prior UTI, pacer        Dg Chest [facility] 1 View  03/08/2013  *RADIOLOGY REPORT*  Clinical Data: Tracheostomy.  Ventilated patient. Assess tracheostomy.  PORTABLE CHEST - 1 VIEW  Comparison: 03/07/2013.  Findings: Unchanged support apparatus with tracheostomy and weighted tip feeding tube.  Feeding tube tip is in the proximal stomach which is distended with gas.  The pacemaker appears similar.  The heart and lungs are similar to the prior.  Tortuous thoracic aorta. No airspace disease or effusion in the lungs.  IMPRESSION: No interval change.  Stable support apparatus in good position.   Original Report Authenticated By: Andreas Newport, M.D.    Dg Chest Port 1 View  03/07/2013  *RADIOLOGY REPORT*  Clinical Data: Respiratory failure  PORTABLE CHEST - 1 VIEW  Comparison: 03/04/2039  Findings: A left subclavian dual lead pacer, feeding tube and tracheostomy tube are unchanged in appearance. Underlying COPD is noted.  Heart and mediastinal contours remain within normal limits. The lung fields demonstrate coarseness of the interstitial markings compatible with underlying bronchitic change.  Some improvement in aeration at the left lung base has occurred with minimal persistent bibasilar volume loss identified.  Bony structures demonstrate old healed fractures of the right clavicle and multiple right-sided ribs.  IMPRESSION: Some improvement in left basilar aeration.  Otherwise unchanged   Original Report Authenticated  By: Rhodia Albright, M.D.    Dg Chest Port 1 View  03/03/2013  *RADIOLOGY REPORT*  Clinical Data: Respiratory failure.  Evaluate airspace disease.  PORTABLE CHEST - 1 VIEW  Comparison: 02/28/2013  Findings: Cardiomegaly with bibasilar opacities, likely atelectasis.  Hyperinflation of the lungs compatible with COPD.  No visible effusions.  Multiple old healed bilateral rib fractures.  Support devices including tracheostomy tube are unchanged.  IMPRESSION: COPD, cardiomegaly.  Bibasilar atelectasis.   Original Report Authenticated By: Charlett Nose, M.D.    Dg Chest Portable 1 View  02/28/2013  *RADIOLOGY REPORT*  Clinical Data: Tracheostomy placement  PORTABLE CHEST - 1 VIEW  Comparison: None.  Findings: The lungs are clear but somewhat hyperaerated.  A tracheostomy is present, overlying the tracheal air shadow with tip approximately 4.7 cm above the carina.  The heart is mildly enlarged.  A dual lead permanent pacemaker is present.  IMPRESSION:  1.  The tracheostomy appears to be in good position overlying the tracheal air shadow. 2.  No active lung disease.  Probable emphysema.   Original Report Authenticated By: Dwyane Dee, M.D.    Dg Abd Portable 1v  02/28/2013  *RADIOLOGY REPORT*  Clinical Data: Feeding tube placement  PORTABLE ABDOMEN - 1 VIEW  Comparison: None.  Findings: A portable film the abdomen shows the tip of the feeding tube aligned the region of the proximal body of the stomach.  Both large and small bowel gas present without distention.  The bones are  somewhat osteopenic.  IMPRESSION: A feeding tube tip overlies the region of the proximal body of the stomach.   Original Report Authenticated By: Dwyane Dee, M.D.   Results for orders placed during the hospital encounter of 02/28/13  BLOOD GAS, ARTERIAL      Result Value Range   FIO2 0.40     Delivery systems VENTILATOR     Mode ASSIST CONTROL     VT 480     Rate 12     Peep/cpap 5.0     pH, Arterial 7.500 (*) 7.350 - 7.450   pCO2  arterial 35.0  35.0 - 45.0 mmHg   pO2, Arterial 83.7  80.0 - 100.0 mmHg   Bicarbonate 27.0 (*) 20.0 - 24.0 mEq/L   TCO2 28.1  0 - 100 mmol/L   Acid-Base Excess 3.8 (*) 0.0 - 2.0 mmol/L   O2 Saturation 97.6     Patient temperature 98.6     Collection site LEFT RADIAL     Drawn by COLLECTED BY RT     Sample type ARTERIAL     Allens test (pass/fail) PASS  PASS  CBC      Result Value Range   WBC 7.8  4.0 - 10.5 K/uL   RBC 3.32 (*) 3.87 - 5.11 MIL/uL   Hemoglobin 11.3 (*) 12.0 - 15.0 g/dL   HCT 16.1 (*) 09.6 - 04.5 %   MCV 98.2  78.0 - 100.0 fL   MCH 34.0  26.0 - 34.0 pg   MCHC 34.7  30.0 - 36.0 g/dL   RDW 40.9  81.1 - 91.4 %   Platelets 189  150 - 400 K/uL  COMPREHENSIVE METABOLIC PANEL      Result Value Range   Sodium 140  135 - 145 mEq/L   Potassium 3.6  3.5 - 5.1 mEq/L   Chloride 106  96 - 112 mEq/L   CO2 27  19 - 32 mEq/L   Glucose, Bld 115 (*) 70 - 99 mg/dL   BUN 33 (*) 6 - 23 mg/dL   Creatinine, Ser 7.82  0.50 - 1.10 mg/dL   Calcium 8.5  8.4 - 95.6 mg/dL   Total Protein 5.2 (*) 6.0 - 8.3 g/dL   Albumin 2.3 (*) 3.5 - 5.2 g/dL   AST 13  0 - 37 U/L   ALT 13  0 - 35 U/L   Alkaline Phosphatase 56  39 - 117 U/L   Total Bilirubin 0.3  0.3 - 1.2 mg/dL   GFR calc non Af Amer 79 (*) >90 mL/min   GFR calc Af Amer >90  >90 mL/min  SEDIMENTATION RATE      Result Value Range   Sed Rate 65 (*) 0 - 22 mm/hr  PRO B NATRIURETIC PEPTIDE      Result Value Range   Pro B Natriuretic peptide (BNP) 1485.0 (*) 0 - 450 pg/mL  MAGNESIUM      Result Value Range   Magnesium 2.1  1.5 - 2.5 mg/dL  PREALBUMIN      Result Value Range   Prealbumin 26.0  17.0 - 34.0 mg/dL  PROCALCITONIN      Result Value Range   Procalcitonin <0.10    CBC      Result Value Range   WBC 9.2  4.0 - 10.5 K/uL   RBC 3.35 (*) 3.87 - 5.11 MIL/uL   Hemoglobin 11.6 (*) 12.0 - 15.0 g/dL   HCT 21.3 (*) 08.6 - 57.8 %   MCV 98.5  78.0 - 100.0 fL   MCH 34.6 (*) 26.0 - 34.0 pg   MCHC 35.2  30.0 - 36.0 g/dL   RDW 47.8   29.5 - 62.1 %   Platelets 185  150 - 400 K/uL  BASIC METABOLIC PANEL      Result Value Range   Sodium 139  135 - 145 mEq/L   Potassium 4.6  3.5 - 5.1 mEq/L   Chloride 104  96 - 112 mEq/L   CO2 27  19 - 32 mEq/L   Glucose, Bld 133 (*) 70 - 99 mg/dL   BUN 47 (*) 6 - 23 mg/dL   Creatinine, Ser 3.08  0.50 - 1.10 mg/dL   Calcium 8.8  8.4 - 65.7 mg/dL   GFR calc non Af Amer 76 (*) >90 mL/min   GFR calc Af Amer 88 (*) >90 mL/min  BASIC METABOLIC PANEL      Result Value Range   Sodium 138  135 - 145 mEq/L   Potassium 4.5  3.5 - 5.1 mEq/L   Chloride 104  96 - 112 mEq/L   CO2 25  19 - 32 mEq/L   Glucose, Bld 107 (*) 70 - 99 mg/dL   BUN 50 (*) 6 - 23 mg/dL   Creatinine, Ser 8.46  0.50 - 1.10 mg/dL   Calcium 9.2  8.4 - 96.2 mg/dL   GFR calc non Af Amer 79 (*) >90 mL/min   GFR calc Af Amer >90  >90 mL/min  TSH      Result Value Range   TSH 0.369  0.350 - 4.500 uIU/mL  CBC      Result Value Range   WBC 11.5 (*) 4.0 - 10.5 K/uL   RBC 3.71 (*) 3.87 - 5.11 MIL/uL   Hemoglobin 12.6  12.0 - 15.0 g/dL   HCT 95.2  84.1 - 32.4 %   MCV 98.9  78.0 - 100.0 fL   MCH 34.0  26.0 - 34.0 pg   MCHC 34.3  30.0 - 36.0 g/dL   RDW 40.1  02.7 - 25.3 %   Platelets 223  150 - 400 K/uL  BASIC METABOLIC PANEL      Result Value Range   Sodium 142  135 - 145 mEq/L   Potassium 4.2  3.5 - 5.1 mEq/L   Chloride 104  96 - 112 mEq/L   CO2 27  19 - 32 mEq/L   Glucose, Bld 122 (*) 70 - 99 mg/dL   BUN 62 (*) 6 - 23 mg/dL   Creatinine, Ser 6.64  0.50 - 1.10 mg/dL   Calcium 9.7  8.4 - 40.3 mg/dL   GFR calc non Af Amer 77 (*) >90 mL/min   GFR calc Af Amer 90 (*) >90 mL/min  COMPREHENSIVE METABOLIC PANEL      Result Value Range   Sodium 143  135 - 145 mEq/L   Potassium 4.3  3.5 - 5.1 mEq/L   Chloride 106  96 - 112 mEq/L   CO2 28  19 - 32 mEq/L   Glucose, Bld 117 (*) 70 - 99 mg/dL   BUN 64 (*) 6 - 23 mg/dL   Creatinine, Ser 4.74  0.50 - 1.10 mg/dL   Calcium 9.3  8.4 - 25.9 mg/dL   Total Protein 6.5  6.0 - 8.3  g/dL   Albumin 2.6 (*) 3.5 - 5.2 g/dL   AST 18  0 - 37 U/L   ALT 16  0 - 35 U/L   Alkaline Phosphatase  63  39 - 117 U/L   Total Bilirubin 0.3  0.3 - 1.2 mg/dL   GFR calc non Af Amer 77 (*) >90 mL/min   GFR calc Af Amer 89 (*) >90 mL/min  CBC WITH DIFFERENTIAL      Result Value Range   WBC 11.5 (*) 4.0 - 10.5 K/uL   RBC 3.38 (*) 3.87 - 5.11 MIL/uL   Hemoglobin 11.9 (*) 12.0 - 15.0 g/dL   HCT 52.8 (*) 41.3 - 24.4 %   MCV 101.8 (*) 78.0 - 100.0 fL   MCH 35.2 (*) 26.0 - 34.0 pg   MCHC 34.6  30.0 - 36.0 g/dL   RDW 01.0  27.2 - 53.6 %   Platelets 212  150 - 400 K/uL   Neutrophils Relative 80 (*) 43 - 77 %   Neutro Abs 9.2 (*) 1.7 - 7.7 K/uL   Lymphocytes Relative 11 (*) 12 - 46 %   Lymphs Abs 1.3  0.7 - 4.0 K/uL   Monocytes Relative 8  3 - 12 %   Monocytes Absolute 0.9  0.1 - 1.0 K/uL   Eosinophils Relative 1  0 - 5 %   Eosinophils Absolute 0.1  0.0 - 0.7 K/uL   Basophils Relative 0  0 - 1 %   Basophils Absolute 0.0  0.0 - 0.1 K/uL   A/P: Pt with hx dementia, VDRF with trach (vocal cord edema), chronic dysphagia , prior aspiration PNA. Plan is tent for placement of a percutaneous gastrostomy tube on 4/28. Pt currently with loose stools, low grade fever and c diff pending. Will monitor over weekend and issue orders on 4/27 if pt stable . Details/risks of procedure d/w pt's son [name] with his understanding and consent.

## 2013-03-09 LAB — CBC
HCT: 34.3 % — ABNORMAL LOW (ref 36.0–46.0)
Hemoglobin: 11.5 g/dL — ABNORMAL LOW (ref 12.0–15.0)
MCHC: 33.5 g/dL (ref 30.0–36.0)
RBC: 3.35 MIL/uL — ABNORMAL LOW (ref 3.87–5.11)

## 2013-03-09 LAB — BASIC METABOLIC PANEL
BUN: 63 mg/dL — ABNORMAL HIGH (ref 6–23)
CO2: 28 mEq/L (ref 19–32)
GFR calc non Af Amer: 81 mL/min — ABNORMAL LOW (ref >90)
Glucose, Bld: 115 mg/dL — ABNORMAL HIGH (ref 70–99)
Potassium: 4.1 mEq/L (ref 3.5–5.1)

## 2013-03-11 ENCOUNTER — Other Ambulatory Visit (HOSPITAL_COMMUNITY): Payer: Self-pay

## 2013-03-11 HISTORY — PX: GASTROSTOMY TUBE PLACEMENT: SHX655

## 2013-03-11 LAB — URINE CULTURE: Colony Count: 75000

## 2013-03-11 MED ORDER — GLUCAGON HCL (RDNA) 1 MG IJ SOLR
1.0000 mg | Freq: Once | INTRAMUSCULAR | Status: AC
  Administered 2013-03-11: 0.5 mg via INTRAVENOUS

## 2013-03-11 MED ORDER — FENTANYL CITRATE 0.05 MG/ML IJ SOLN
INTRAMUSCULAR | Status: AC | PRN
  Administered 2013-03-11: 25 ug via INTRAVENOUS

## 2013-03-11 MED ORDER — IOHEXOL 300 MG/ML  SOLN
50.0000 mL | Freq: Once | INTRAMUSCULAR | Status: AC | PRN
  Administered 2013-03-11: 10 mL

## 2013-03-11 MED ORDER — CEFAZOLIN SODIUM 1-5 GM-% IV SOLN
1.0000 g | Freq: Once | INTRAVENOUS | Status: AC
  Administered 2013-03-11: 1 g via INTRAVENOUS

## 2013-03-11 MED ORDER — ONDANSETRON HCL 4 MG/2ML IJ SOLN: 4.0000 mg | INTRAMUSCULAR | Status: DC | PRN

## 2013-03-11 MED ORDER — MIDAZOLAM HCL 2 MG/2ML IJ SOLN
INTRAMUSCULAR | Status: AC | PRN
  Administered 2013-03-11: 1 mg via INTRAVENOUS

## 2013-03-11 NOTE — Progress Notes (Signed)
PULMONARY  / CRITICAL CARE MEDICINE  Name: [name] MRN: [id number] DOB: 1928/10/30    ADMISSION DATE:  02/28/2013 CONSULTATION DATE: 4-21  REFERRING MD : [facility] PRIMARY SERVICE:  [facility]  CHIEF COMPLAINT: VDRF   Significant Hospital tests/ studies/ interventions and procedures  Lines / Drains:  4/13 ETT>> extubated 4/14>>> re-intubated 4/14 >> 4/17 4/13 foley >>>  4-17 perq trach df, currently #6 4/25>>   History of Present Illness: [name] is a 79 yo woman who lives at a nursing home with pmh COPD, hypothyroidism, a-fib, and dysphagia presented after dinner with some "gurggling noise" apart from baseline. Pt was apparently eating dinner and then seemed to be having some respiratory distress along with upper airway noise. Nurse was concerned for aspiration and pt was then intubated in ED 2/2 fatigue from respiratory distress.  SUBJECTIVE:  NAD  VITAL SIGNS: Pulse Rate:  [60-68] 68 (04/28 0919) Resp:  [15-21] 19 (04/28 0915) BP: (106-128)/(64-78) 128/75 mmHg (04/28 0919) SpO2:  [94 %-95 %] 94 % (04/28 0919) Vital signs reviewed. Abnormal values will appear under impression plan section.   40% on ATC   PHYSICAL EXAMINATION:  General: Alert , on 28% t collar Neuro: RASS 0, follows commands  HEENT: PERRLA, EOMI,  Cardiovascular: regular rate, rhythm, no murmur, rubs or gallop  Lungs: CTAB, no crackles or wheezes  Abdomen: Soft, non tender , positive bowel sounds, no organomegaly  Skin: Warm and dry, very frail     Recent Labs Lab 03/06/13 0500 03/07/13 0700 03/09/13 0700  NA 142 143 140  K 4.2 4.3 4.1  CL 104 106 102  CO2 27 28 28   BUN 62* 64* 63*  CREATININE 0.73 0.75 0.63  GLUCOSE 122* 117* 115*    Recent Labs Lab 03/06/13 0500 03/07/13 0700 03/09/13 0700  HGB 12.6 11.9* 11.5*  HCT 36.7 34.4* 34.3*  WBC 11.5* 11.5* 8.9  PLT 223 212 208   Ir Gastrostomy Tube Mod Sed  03/11/2013  *RADIOLOGY REPORT*  Clinical Data:Failure to thrive, respiratory  insufficiency, needs enteral feeding support  PERC PLACEMENT GASTROSTOMY  Fluoroscopy Time: 6.7 minutes  Technique: The procedure, risks, benefits, and alternatives were explained to the family.  Questions regarding the procedure were encouraged and answered.  The family understands and consents to the procedure.  As antibiotic prophylaxis, cefazolin 1 gram was ordered pre- procedure and administered intravenously within one hour of incision.  A 5 French angiographic catheter was placed as orogastric tube. The upper abdomen was prepped with Betadine, draped in usual sterile fashion, and infiltrated locally with 1% lidocaine.  Intravenous Fentanyl and Versed were administered as conscious sedation during continuous cardiorespiratory monitoring by the radiology RN, with a total moderate sedation time of 10 minutes.  Glucagon 0.5 mg IV administered to slow gastric peristalsis.Stomach was insufflated using air through the orogastric tube. An 37 French sheath needle was advanced percutaneously into the gastric lumen under fluoroscopy. Gas could be aspirated and a small contrast injection confirmed intraluminal spread. The sheath was exchanged over a guidewire for a 9 [place] vascular sheath, through which the snare device was advanced and used to snare a guidewire passed through the orogastric tube. This was withdrawn, and the snare attached to the 20 French pull-through gastrostomy tube, which was advanced antegrade, positioned with the internal bumper securing the anterior gastric wall to the anterior abdominal wall. Small contrast injection confirms appropriate positioning. The external bumper was applied and the catheter was flushed. No immediate complication.  IMPRESSION: 1. Technically successful  20 French pull-through gastrostomy placement under fluoroscopy.   Original Report Authenticated By: D. Andria Rhein, MD    Dg Abd Portable 1v  03/11/2013  *RADIOLOGY REPORT*  Clinical Data: Evaluate barium in bowel prior  to G tube placement.  PORTABLE ABDOMEN - 1 VIEW  Comparison: 02/28/2013.  Findings: Image quality is degraded by motion.  No definite retained oral contrast in small bowel or colon.  There is diffuse gaseous prominence of small bowel and colon.  Feeding tube terminates in the stomach.  IMPRESSION:  1.  Image quality is degraded by motion. 2.  No definite retained oral contrast. 3.  Diffuse gaseous prominence of bowel.   Original Report Authenticated By: Leanna Battles, M.D.     ASSESSMENT / PLAN:  1. PULMONARY  ASSESSMENT: Acute hypoxemic respiratory failure. Intubated 4/13> extubated 4/14 and re-intubated> trached 4-17 PLAN:  Wean per protocol, plan 16h ATC today 4/28 If appears good, would consider 24 hrs as was upper airway was cause of this mostly esnure PMV active, assess strength  5. HEMATOLOGIC  ASSESSMENT: Hgb 11.7<11.5< 13. WBC. 11.5<12<9.9<13.9  PLAN:  -per ssh  -No DVT on dopplers and CT angio.   Brett Canales Minor ACNP [facility] Pager [phone] till 3 pm If no answer page [phone] 03/11/2013, 11:54 AM  I have fully examined this patient and agree with above findings.    And edited infull  Mcarthur Rossetti. Tyson Alias, MD, FACP Pgr: [phone] [facility]

## 2013-03-11 NOTE — Procedures (Signed)
73F gastrostomy placed No complication No blood loss. See complete dictation in [facility].

## 2013-03-12 MED FILL — Cefazolin Sodium for IV Soln 2 GM and Dextrose 3% (50 ML): INTRAVENOUS | Qty: 50 | Status: AC

## 2013-03-12 MED FILL — Glucagon HCl (rDNA) For Inj 1 MG (Base Equiv): INTRAMUSCULAR | Qty: 1 | Status: AC

## 2013-03-13 ENCOUNTER — Other Ambulatory Visit (HOSPITAL_COMMUNITY): Payer: Self-pay

## 2013-03-14 ENCOUNTER — Other Ambulatory Visit (HOSPITAL_COMMUNITY): Payer: Self-pay

## 2013-03-14 ENCOUNTER — Encounter: Payer: Self-pay | Admitting: Radiology

## 2013-03-14 NOTE — Progress Notes (Signed)
PULMONARY  / CRITICAL CARE MEDICINE  Name: [name] MRN: [id number] DOB: Aug 13, 1928    ADMISSION DATE:  02/28/2013 CONSULTATION DATE: 4-21  REFERRING MD : [facility] PRIMARY SERVICE:  [facility]  CHIEF COMPLAINT: VDRF   Significant Hospital tests/ studies/ interventions and procedures  Lines / Drains:  4/13 ETT>> extubated 4/14>>> re-intubated 4/14 >> 4/17 4/13 foley >>>  4-17 perq trach df, currently #6 4/25>>  History of Present Illness: [name] is a 79 yo woman who lives at a nursing home with pmh COPD, hypothyroidism, a-fib, and dysphagia presented after dinner with some "gurggling noise" apart from baseline. Pt was apparently eating dinner and then seemed to be having some respiratory distress along with upper airway noise. Nurse was concerned for aspiration and pt was then intubated in ED 2/2 fatigue from respiratory distress.  SUBJECTIVE:  NAD  VITAL SIGNS:   Vital signs reviewed. Abnormal values will appear under impression plan section.   40% on ATC rr 16 t wnl   PHYSICAL EXAMINATION:  General: Alert , on 28% t collar Neuro: RASS 0, follows commands, limited vocalizing HEENT: PERRLA Cardiovascular: regular rate, rhythm, no murmur, rubs or gallop  Lungs: CTAB Abdomen: Soft, non tender , positive bowel sounds, no organomegaly  Skin: Warm and dry, very frail     Recent Labs Lab 03/09/13 0700  NA 140  K 4.1  CL 102  CO2 28  BUN 63*  CREATININE 0.63  GLUCOSE 115*    Recent Labs Lab 03/09/13 0700  HGB 11.5*  HCT 34.3*  WBC 8.9  PLT 208   Ct Head Wo Contrast  03/14/2013  *RADIOLOGY REPORT*  Clinical Data: patient found on floor.  Unwitnessed fall.  Altered mental status.  CT HEAD WITHOUT CONTRAST  Technique:  Contiguous axial images were obtained from the base of the skull through the vertex without contrast.  Comparison: None.  Findings: The calvarium is intact.  No skull fracture.  Mastoid air cells and paranasal sinuses are within normal limits. No mass  lesion, mass effect, midline shift, hydrocephalus, hemorrhage.  No acute territorial cortical ischemia/infarct. Atrophy and chronic ischemic white matter disease is present.  IMPRESSION: Atrophy and chronic ischemic white matter disease without acute intracranial abnormality.   Original Report Authenticated By: Andreas Newport, M.D.     ASSESSMENT / PLAN:  1. PULMONARY  ASSESSMENT: Acute hypoxemic respiratory failure. Intubated 4/13> extubated 4/14 and re-intubated> trached 4-17 concerning lesions in upper trachea Limited vocalizing Neuro component PLAN:  Continue trach size 6 until ent eval then would consider to 4 then and see if improved pmv Continue PMV and increased verbal response (some may be dementia, not attempting) ENT eval for upper tracheal lesions BX? Continue trach collar trials  I have fully examined this patient and agree with above findings.    And edited infull  Mcarthur Rossetti. Tyson Alias, MD, FACP Pgr: [phone] [facility]

## 2013-03-15 ENCOUNTER — Other Ambulatory Visit (HOSPITAL_COMMUNITY): Payer: Self-pay

## 2013-03-15 LAB — CBC
HCT: 37.6 % (ref 36.0–46.0)
MCHC: 35.1 g/dL (ref 30.0–36.0)
Platelets: 262 10*3/uL (ref 150–400)
RDW: 14.5 % (ref 11.5–15.5)
WBC: 8.4 10*3/uL (ref 4.0–10.5)

## 2013-03-15 LAB — BASIC METABOLIC PANEL
BUN: 27 mg/dL — ABNORMAL HIGH (ref 6–23)
Chloride: 99 mEq/L (ref 96–112)
GFR calc Af Amer: >90 mL/min (ref >90)
GFR calc non Af Amer: 86 mL/min — ABNORMAL LOW (ref >90)
Potassium: 4.3 mEq/L (ref 3.5–5.1)

## 2013-03-15 NOTE — Progress Notes (Signed)
PULMONARY  / CRITICAL CARE MEDICINE  Name: [name] MRN: [id number] DOB: 1928-02-04    ADMISSION DATE:  02/28/2013 CONSULTATION DATE: 4-21  REFERRING MD : [facility] PRIMARY SERVICE:  [facility]  CHIEF COMPLAINT: VDRF   Significant Hospital tests/ studies/ interventions and procedures  Lines / Drains:  4/13 ETT>> extubated 4/14>>> re-intubated 4/14 >> 4/17 4/13 foley >>>  4-17 perq trach df, currently #6 4/25>>  History of Present Illness: [name] is a 79 yo woman who lives at a nursing home with pmh COPD, hypothyroidism, a-fib, and dysphagia presented after dinner with some "gurggling noise" apart from baseline. Pt was apparently eating dinner and then seemed to be having some respiratory distress along with upper airway noise. Nurse was concerned for aspiration and pt was then intubated in ED 2/2 fatigue from respiratory distress.  SUBJECTIVE:  NAD  VITAL SIGNS:   Vital signs reviewed. Abnormal values will appear under impression plan section.   40% TC continous   PHYSICAL EXAMINATION:  General: Alert , on 28% t collar Neuro: RASS 0, angry appearing, no phonation HEENT: PERRLA Cardiovascular: regular rate, rhythm, no murmur, rubs or gallop  Lungs: CTAB Abdomen: Soft, non tender , positive bowel sounds, no organomegaly  Skin: Warm and dry, very frail     Recent Labs Lab 03/09/13 0700 03/15/13 0554  NA 140 135  K 4.1 4.3  CL 102 99  CO2 28 25  BUN 63* 27*  CREATININE 0.63 0.53  GLUCOSE 115* 85    Recent Labs Lab 03/09/13 0700 03/15/13 0554  HGB 11.5* 13.2  HCT 34.3* 37.6  WBC 8.9 8.4  PLT 208 262   Ct Head Wo Contrast  03/14/2013  *RADIOLOGY REPORT*  Clinical Data: patient found on floor.  Unwitnessed fall.  Altered mental status.  CT HEAD WITHOUT CONTRAST  Technique:  Contiguous axial images were obtained from the base of the skull through the vertex without contrast.  Comparison: None.  Findings: The calvarium is intact.  No skull fracture.  Mastoid air  cells and paranasal sinuses are within normal limits. No mass lesion, mass effect, midline shift, hydrocephalus, hemorrhage.  No acute territorial cortical ischemia/infarct. Atrophy and chronic ischemic white matter disease is present.  IMPRESSION: Atrophy and chronic ischemic white matter disease without acute intracranial abnormality.   Original Report Authenticated By: Andreas Newport, M.D.     ASSESSMENT / PLAN:  1. PULMONARY  ASSESSMENT: Acute hypoxemic respiratory failure. Intubated 4/13> extubated 4/14 and re-intubated> trached 4-17 concerning lesions in upper trachea Limited vocalizing Neuro component PLAN:  Keep size 6 trach for now Continue trach collar] D/w ENT findings from initial bronch during trach: plan will be to rebonrch now to see if lesions remain vs inflammatory in narture If remain -- then Monday to OR for BX by ENT  If phonation remains poor will need eval as well of cords I will also attempt to eval cords now after bronch through trach  See additional noted for result   Mcarthur Rossetti. Tyson Alias, MD, FACP Pgr: [phone] [facility]

## 2013-03-15 NOTE — Procedures (Signed)
Bronchoscopy Procedure Note Rielynn Trulson [id number] December 02, 1927  Procedure: Bronchoscopy Indications: Diagnostic evaluation of the airways  Procedure Details Consent: Risks of procedure as well as the alternatives and risks of each were explained to the (patient/caregiver).  Consent for procedure obtained. Time Out: Verified patient identification, verified procedure, site/side was marked, verified correct patient position, special equipment/implants available, medications/allergies/relevent history reviewed, required imaging and test results available.  Performed  In preparation for procedure, patient was given 100% FiO2 and bronchoscope lubricated. Sedation: Etomidate  Airway entered and the following bronchi were examined: RUL, RML, RLL, LUL, LLL and Bronchi.   Procedures performed: Brushings not performed Bronchoscope removed.    Evaluation Hemodynamic Status: BP stable throughout; O2 sats: stable throughout Patient's Current Condition: stable Specimens:  Sent serosanguinous fluid Complications: No apparent complications Patient did tolerate procedure well.   Nelda Bucks. 03/15/2013  1. Remaining small anular white cap lesions tracheal wall below trach outlet (also present on prior bronch by Dr Sung Amabile and were extensive in the area where trach is now, which I could not eval now because of trach 2. BAL at lesions sent cytology 3. Upper airway exam: extensive edema epiglottis and posterior arrytnoids, very difficult to eval cords because of edema - likely explains no phonation with trach now, difficult to tell if any concerning lesions  Rec: steroids then end of next week thur, consider ENT to eval upper airway and white cap lesions for brush / bx  Mcarthur Rossetti. Tyson Alias, MD, FACP Pgr: [phone] [facility]

## 2013-03-18 NOTE — Progress Notes (Signed)
PULMONARY  / CRITICAL CARE MEDICINE  Name: [name] MRN: [id number] DOB: 1928/08/15    ADMISSION DATE:  02/28/2013 CONSULTATION DATE: 4-21  REFERRING MD : [facility] PRIMARY SERVICE:  [facility]  CHIEF COMPLAINT: VDRF   Significant Hospital tests/ studies/ interventions and procedures  Lines / Drains:  4/13 ETT>> extubated 4/14>>> re-intubated 4/14 >> 4/17 4/13 foley >>>  4-17 perq trach df, currently #6 4/25>>  History of Present Illness: [name] is a 79 yo woman who lives at a nursing home with pmh COPD, hypothyroidism, a-fib, and dysphagia presented after dinner with some "gurggling noise" apart from baseline. Pt was apparently eating dinner and then seemed to be having some respiratory distress along with upper airway noise. Nurse was concerned for aspiration and pt was then intubated in ED 2/2 fatigue from respiratory distress.  SUBJECTIVE:  NAD, restless at times   VITAL SIGNS:   Vital signs reviewed. Afebrile, sats 98% on 28%   40% TC continous   PHYSICAL EXAMINATION:  General: Alert , on 28% t collar Neuro: RASS 0, no phonation HEENT: PERRLA Cardiovascular: regular rate, rhythm, no murmur, rubs or gallop  Lungs: scattered rhonchi  Abdomen: Soft, non tender , positive bowel sounds, no organomegaly  Skin: Warm and dry, very frail     Recent Labs Lab 03/15/13 0554  NA 135  K 4.3  CL 99  CO2 25  BUN 27*  CREATININE 0.53  GLUCOSE 85    Recent Labs Lab 03/15/13 0554  HGB 13.2  HCT 37.6  WBC 8.4  PLT 262    ASSESSMENT / PLAN:  Acute hypoxemic respiratory failure due to aspiration PNA in setting of baseline dementia and dysphagia trached 4-17 concerning lesions in upper trachea (see bronch note on 5/2)  PLAN:  Keep size 6 trach for now, prob no benefit in down sizing as aspiration and dysphagia expected complications as dementia progresses.  Wean of prednisone as tolerated  PCCM will f/u 5/12 >> call if help needed sooner.  Coralyn Helling,  MD [facility]monary[facility] 03/18/2013, 11:28 AM Pager:  [phone] After 3pm call: [phone]

## 2013-03-19 ENCOUNTER — Other Ambulatory Visit (HOSPITAL_COMMUNITY): Payer: Self-pay

## 2013-03-19 LAB — COMPREHENSIVE METABOLIC PANEL
ALT: 33 U/L (ref 0–35)
Albumin: 2.7 g/dL — ABNORMAL LOW (ref 3.5–5.2)
Alkaline Phosphatase: 82 U/L (ref 39–117)
Chloride: 100 mEq/L (ref 96–112)
Glucose, Bld: 99 mg/dL (ref 70–99)
Potassium: 4.3 mEq/L (ref 3.5–5.1)
Sodium: 136 mEq/L (ref 135–145)
Total Bilirubin: 0.3 mg/dL (ref 0.3–1.2)
Total Protein: 6.7 g/dL (ref 6.0–8.3)

## 2013-03-19 LAB — CBC
HCT: 36.8 % (ref 36.0–46.0)
Hemoglobin: 13.1 g/dL (ref 12.0–15.0)
MCHC: 35.6 g/dL (ref 30.0–36.0)
RDW: 14.6 % (ref 11.5–15.5)
WBC: 16.6 10*3/uL — ABNORMAL HIGH (ref 4.0–10.5)

## 2013-03-21 LAB — CBC WITH DIFFERENTIAL/PLATELET
Basophils Absolute: 0 10*3/uL (ref 0.0–0.1)
Basophils Absolute: 0 10*3/uL (ref 0.0–0.1)
Basophils Relative: 0 % (ref 0–1)
Basophils Relative: 0 % (ref 0–1)
Eosinophils Absolute: 0.1 10*3/uL (ref 0.0–0.7)
Eosinophils Absolute: 0.2 10*3/uL (ref 0.0–0.7)
Eosinophils Relative: 1 % (ref 0–5)
HCT: 27.6 % — ABNORMAL LOW (ref 36.0–46.0)
MCHC: 33.7 g/dL (ref 30.0–36.0)
MCHC: 33.7 g/dL (ref 30.0–36.0)
MCV: 102.6 fL — ABNORMAL HIGH (ref 78.0–100.0)
Monocytes Absolute: 0.6 10*3/uL (ref 0.1–1.0)
Neutro Abs: 7.3 10*3/uL (ref 1.7–7.7)
Neutrophils Relative %: 65 % (ref 43–77)
Platelets: 228 10*3/uL (ref 150–400)
RDW: 14.8 % (ref 11.5–15.5)
RDW: 14.6 % (ref 11.5–15.5)

## 2013-03-21 LAB — BASIC METABOLIC PANEL
BUN: 61 mg/dL — ABNORMAL HIGH (ref 6–23)
Calcium: 8.9 mg/dL (ref 8.4–10.5)
Creatinine, Ser: 0.75 mg/dL (ref 0.50–1.10)
GFR calc Af Amer: 89 mL/min — ABNORMAL LOW (ref >90)
GFR calc non Af Amer: 77 mL/min — ABNORMAL LOW (ref >90)
Potassium: 4 mEq/L (ref 3.5–5.1)

## 2013-03-25 ENCOUNTER — Other Ambulatory Visit (HOSPITAL_COMMUNITY): Payer: Self-pay

## 2013-03-25 LAB — CBC
Hemoglobin: 10.8 g/dL — ABNORMAL LOW (ref 12.0–15.0)
MCHC: 33.9 g/dL (ref 30.0–36.0)
Platelets: 228 10*3/uL (ref 150–400)
RBC: 3.11 MIL/uL — ABNORMAL LOW (ref 3.87–5.11)

## 2013-03-25 LAB — BASIC METABOLIC PANEL
CO2: 23 mEq/L (ref 19–32)
GFR calc non Af Amer: 80 mL/min — ABNORMAL LOW (ref >90)
Glucose, Bld: 113 mg/dL — ABNORMAL HIGH (ref 70–99)
Potassium: 3.3 mEq/L — ABNORMAL LOW (ref 3.5–5.1)
Sodium: 138 mEq/L (ref 135–145)

## 2013-03-25 NOTE — Progress Notes (Signed)
PULMONARY  / CRITICAL CARE MEDICINE  Name: [name] MRN: [id number] DOB: 16-Oct-1928    ADMISSION DATE:  02/28/2013 CONSULTATION DATE: 4-21  REFERRING MD : [facility] PRIMARY SERVICE:  [facility]  CHIEF COMPLAINT: VDRF   Significant Hospital tests/ studies/ interventions and procedures  Lines / Drains:  4/13 ETT>> 4/14,  4/14 >> 4/17 4-17 Trach (DF) >>  History of Present Illness: [name] is a 79 yo woman who lives at a nursing home with pmh COPD, hypothyroidism, a-fib, and dysphagia presented after dinner with some "gurgling noise" apart from baseline. Pt was apparently eating dinner and then seemed to be having some respiratory distress along with upper airway noise. Nurse was concerned for aspiration and pt was then intubated in ED 2/2 fatigue from respiratory distress.  SUBJECTIVE:  NAD with trach tube capped, Oral secretions foaming @ corner of mouth  VITAL SIGNS:   Vital signs reviewed. Afebrile, sats 98% on 28%     PHYSICAL EXAMINATION:  General: Alert , on 28% t collar Neuro: RASS 0, no phonation HEENT: PERRLA Cardiovascular: regular rate, rhythm, no murmur, rubs or gallop  Lungs: scattered rhonchi  Abdomen: Soft, non tender , positive bowel sounds, no organomegaly  Skin: Warm and dry, very frail     Recent Labs Lab 03/19/13 0615 03/21/13 1140 03/25/13 0500  NA 136 142 138  K 4.3 4.0 3.3*  CL 100 104 105  CO2 21 23 23   BUN 52* 61* 40*  CREATININE 0.77 0.75 0.67  GLUCOSE 99 96 113*    Recent Labs Lab 03/21/13 0538 03/21/13 1140 03/25/13 0500  HGB 9.3* 12.3 10.8*  HCT 27.6* 36.5 31.9*  WBC 8.2 11.2* 11.2*  PLT 228 324 228    ASSESSMENT / PLAN:  Acute hypoxemic respiratory failure due to aspiration PNA  baseline dementia and dysphagia concerning lesions in upper trachea (see bronch note on 5/2)  PLAN:  Dr Ezzard Standing following for trach tueb and tracheal lesions Little benefit from decannulation, it seems Will sign off. Please call if we can be of  further assistance    Billy Fischer, MD ; [facility] [phone].  After 5:30 PM or weekends, call [phone]

## 2013-03-25 NOTE — Consult Note (Signed)
NAME:  HIEBERLE, CLAIR                   ACCOUNT NO.:  MEDICAL RECORD NO.:  [id number]  LOCATION:                                 FACILITY:  PHYSICIAN:  Christopher E. Ezzard Standing, M.D. DATE OF BIRTH:  DATE OF CONSULTATION:  03/22/2013 DATE OF DISCHARGE:                                CONSULTATION   REASON FOR CONSULT:  Evaluate the patient with vocal cord edema and difficulty speaking.  BRIEF HISTORY:  [name] is an 79 year old female resident of a nursing home.  She was admitted to [facility] through the emergency room because of respiratory distress and required prolonged intubation and subsequent tracheotomy.  During bronchoscopy during intubation, the patient was noted to have significant vocal cord edema.  [facility] was subsequently [facility] and is recommended the patient have a laryngoscopy to evaluate vocal cord edema. Subsequently, consulted to evaluate vocal cord edema and difficulty talking.  On examination, the patient has a tracheotomy in place with the balloon blown up.  I released the pressure from the balloon and placed a Passy Muir valve, and the patient was able to verbalize some, but she has a very flat affect and does not make a lot of effort to speak or volunteer information.  She is able to swallow some water through a straw with minimal coughing, but is at risk for aspiration. Fiberoptic laryngoscopy performed through the left nostril revealed a clear nasopharynx, clear hypopharynx.  She had a fair amount of supraglottic mucosal covering the vocal cords, but this was cleared with coughing.  The vocal cords themselves were actually fairly clear with a minimal edema.  Vocal cords had symmetric normal mobility or able to close adequately with no significant vocal cord weakness noted.  There were no significant vocal cord pathology and has minimal vocal cord edema.  Epiglottis and remaining larynx was normal.  IMPRESSION:  Normal  evaluation of vocal cords with minimal edema and normal vocal cord mobility.  Had a very flat affect with minimal attempts at speaking, but is able to verbalize and express thank you without any difficulty.  She had a large amount of supraglottic and mucus, and is at risk of aspiration and should be probably evaluated for aspiration prior to beginning a p.o. diet.          ______________________________ Kristine Garbe. Ezzard Standing, M.D.     CEN/MEDQ  D:  03/22/2013  T:  03/23/2013  Job:  [id number]

## 2013-03-26 ENCOUNTER — Other Ambulatory Visit (HOSPITAL_COMMUNITY): Payer: Medicare Other

## 2013-03-27 ENCOUNTER — Other Ambulatory Visit (HOSPITAL_COMMUNITY): Payer: Self-pay

## 2013-03-27 LAB — CBC
Hemoglobin: 10.3 g/dL — ABNORMAL LOW (ref 12.0–15.0)
MCH: 34 pg (ref 26.0–34.0)
Platelets: 197 10*3/uL (ref 150–400)
RBC: 3.03 MIL/uL — ABNORMAL LOW (ref 3.87–5.11)
WBC: 11.9 10*3/uL — ABNORMAL HIGH (ref 4.0–10.5)

## 2013-03-27 LAB — BASIC METABOLIC PANEL
CO2: 19 mEq/L (ref 19–32)
Calcium: 8.8 mg/dL (ref 8.4–10.5)
Chloride: 104 mEq/L (ref 96–112)
Glucose, Bld: 122 mg/dL — ABNORMAL HIGH (ref 70–99)
Sodium: 135 mEq/L (ref 135–145)

## 2013-03-30 LAB — CBC
HCT: 32 % — ABNORMAL LOW (ref 36.0–46.0)
Hemoglobin: 11 g/dL — ABNORMAL LOW (ref 12.0–15.0)
MCH: 34.8 pg — ABNORMAL HIGH (ref 26.0–34.0)
MCV: 101.3 fL — ABNORMAL HIGH (ref 78.0–100.0)
Platelets: 176 10*3/uL (ref 150–400)
RBC: 3.16 MIL/uL — ABNORMAL LOW (ref 3.87–5.11)
WBC: 11.5 10*3/uL — ABNORMAL HIGH (ref 4.0–10.5)

## 2013-04-01 LAB — BASIC METABOLIC PANEL
BUN: 32 mg/dL — ABNORMAL HIGH (ref 6–23)
Calcium: 9.5 mg/dL (ref 8.4–10.5)
Creatinine, Ser: 0.67 mg/dL (ref 0.50–1.10)
GFR calc Af Amer: >90 mL/min (ref >90)
GFR calc non Af Amer: 80 mL/min — ABNORMAL LOW (ref >90)
Glucose, Bld: 124 mg/dL — ABNORMAL HIGH (ref 70–99)

## 2013-04-01 LAB — CBC
HCT: 31.2 % — ABNORMAL LOW (ref 36.0–46.0)
Hemoglobin: 10.5 g/dL — ABNORMAL LOW (ref 12.0–15.0)
MCH: 34.3 pg — ABNORMAL HIGH (ref 26.0–34.0)
MCHC: 33.7 g/dL (ref 30.0–36.0)
MCV: 102 fL — ABNORMAL HIGH (ref 78.0–100.0)
RDW: 13.7 % (ref 11.5–15.5)

## 2013-04-02 ENCOUNTER — Encounter (HOSPITAL_COMMUNITY): Payer: Self-pay | Admitting: *Deleted

## 2013-04-03 ENCOUNTER — Encounter: Payer: Self-pay | Admitting: Internal Medicine

## 2013-04-03 ENCOUNTER — Non-Acute Institutional Stay (SKILLED_NURSING_FACILITY): Payer: Medicare Other | Admitting: Internal Medicine

## 2013-04-03 ENCOUNTER — Other Ambulatory Visit: Payer: Self-pay | Admitting: *Deleted

## 2013-04-03 DIAGNOSIS — J690 Pneumonitis due to inhalation of food and vomit: Secondary | ICD-10-CM

## 2013-04-03 DIAGNOSIS — F319 Bipolar disorder, unspecified: Secondary | ICD-10-CM

## 2013-04-03 DIAGNOSIS — I10 Essential (primary) hypertension: Secondary | ICD-10-CM

## 2013-04-03 DIAGNOSIS — F0390 Unspecified dementia without behavioral disturbance: Secondary | ICD-10-CM

## 2013-04-03 DIAGNOSIS — Z930 Tracheostomy status: Secondary | ICD-10-CM

## 2013-04-03 DIAGNOSIS — J4489 Other specified chronic obstructive pulmonary disease: Secondary | ICD-10-CM

## 2013-04-03 DIAGNOSIS — J449 Chronic obstructive pulmonary disease, unspecified: Secondary | ICD-10-CM

## 2013-04-03 DIAGNOSIS — E039 Hypothyroidism, unspecified: Secondary | ICD-10-CM

## 2013-04-03 DIAGNOSIS — J9620 Acute and chronic respiratory failure, unspecified whether with hypoxia or hypercapnia: Secondary | ICD-10-CM

## 2013-04-03 MED ORDER — LORAZEPAM 2 MG/ML IJ SOLN: INTRAMUSCULAR | Status: DC

## 2013-04-03 NOTE — Progress Notes (Signed)
Date: 06/21/2013  MRN:  [id number] Name:  [name] Sex:  female Age:  79 y.o. DOB:1928/05/12   PSC #:                       Facility/Room:  [facility] Level Of Care:  SNF Provider: Kermit Balo, DO, CMD  Emergency Contacts: Contact Information   Name Relation Home Work [place] Son [phone]     [facility] Daughter [phone]     [place], [place] [phone]        Code Status:full code  Allergies:No Known Allergies   Chief Complaint  Patient presents with  . Hospitalization Follow-up    Readmission s/p prolonged hospitalization since 02/28/13 with acute respiratory failure   HPI:  79 yo female with h/o blindness, bipolar disorder, COPD and dementia was readmitted [facility] for continued [facility] term care after prolonged hospitalization at [facility] with acute respiratory failure from vocal cord edema, COPD and right bronchial ring hypertrophy due to chronic aspiration.  She underwent tracheostomy during this stay as well as PEG placement and is now on jevity 1.5.  She was decannulated successfully a month after tracheostomy was placed.  She returns with the same complaint she has had long term of a sore throat.  Chart indicates she is to f/u with ENT to reevaluate her vocal cords.   Past Medical History  Diagnosis Date  . COPD (chronic obstructive pulmonary disease)   . Hypertension   . Blind   . Hypothyroid   . Pacemaker   . Renal insufficiency   . Finger fracture   . Dementia   . Bipolar disorder   . Dysphagia     needs thick nectar liquids  . Atrial fibrillation   . Pneumonia   . Anxiety   . Diastolic heart failure     Past Surgical History  Procedure Laterality Date  . Insert / replace / remove pacemaker    . Tracheostomy  02/27/13    decannulated 03/29/13  . Gastrostomy tube placement  03/11/13     Current Outpatient Prescriptions  Medication Sig Dispense Refill  . cephALEXin (KEFLEX) 500 MG  capsule Take 500 mg by mouth 3 (three) times daily.      Marland Kitchen donepezil (ARICEPT) 10 MG tablet Take 10 mg by mouth at bedtime.      . food thickener (THICK IT) POWD Take 1 Container by mouth as needed. Nectar thick liqiud      . furosemide (LASIX) 20 MG tablet Take 20 mg by mouth 2 (two) times daily.      . insulin lispro (HUMALOG) 100 UNIT/ML injection Inject 3 Units into the skin 3 (three) times daily before meals.      Marland Kitchen levothyroxine (SYNTHROID, LEVOTHROID) 150 MCG tablet Take 150 mcg by mouth daily before breakfast.      . LORazepam (ATIVAN) 0.5 MG tablet Take 0.5 mg by mouth every 8 (eight) hours as needed for anxiety.      . memantine (NAMENDA) 10 MG tablet Take 10 mg by mouth 2 (two) times daily.      . Menthol-Zinc Oxide 0.44-20.625 % OINT Apply 1 application topically 3 (three) times daily. For skin protector      . mirtazapine (REMERON) 7.5 MG tablet Take 7.5 mg by mouth at bedtime.      Marland Kitchen oxyCODONE (OXY IR/ROXICODONE) 5 MG immediate release tablet Take one tablet by mouth every 6 hours. Emergency dispense.  12  tablet  0  . pantoprazole sodium (PROTONIX) 40 mg/20 mL PACK Place 40 mg into feeding tube daily.      . potassium chloride SA (K-DUR,KLOR-CON) 20 MEQ tablet Take 20 mEq by mouth daily.      . Psyllium Husk POWD Take 5 mLs by mouth 2 (two) times daily.      . QUEtiapine (SEROQUEL) 25 MG tablet Take 12.5 mg by mouth at bedtime.      . saccharomyces boulardii (FLORASTOR) 250 MG capsule Take 250 mg by mouth 2 (two) times daily.      Marland Kitchen venlafaxine (EFFEXOR) 37.5 MG tablet Take 37.5 mg by mouth 2 (two) times daily.       No current facility-administered medications for this visit.    There is no immunization history on file for this patient.  Diet:  On tube feeding and pleasure feedings  History  Substance Use Topics  . Smoking status: Former Smoker -- .5 years    Types: Cigarettes  . Smokeless tobacco: Not on file  . Alcohol Use: Not on file    No family history on file.    Review of Systems  Constitutional: Positive for weight loss and malaise/fatigue. Negative for fever.  HENT: Positive for sore throat. Negative for congestion.   Eyes: Positive for blurred vision.  Respiratory: Positive for cough.   Cardiovascular: Negative for chest pain.  Gastrointestinal: Negative for heartburn, nausea and vomiting.  Genitourinary: Negative for dysuria.  Musculoskeletal: Positive for back pain. Negative for falls.  Skin: Negative for rash.  Neurological: Positive for weakness. Negative for dizziness.  Endo/Heme/Allergies: Bruises/bleeds easily.  Psychiatric/Behavioral: Positive for depression and memory loss.    Physical Exam  Nursing note and vitals reviewed. Constitutional:  Frail white female, NAD  HENT:  Head: Normocephalic and atraumatic.  Right Ear: External ear normal.  Left Ear: External ear normal.  Nose: Nose normal.  Mouth/Throat: Oropharynx is clear and moist. No oropharyngeal exudate.  Eyes: Conjunctivae are normal. Pupils are equal, round, and reactive to light. No scleral icterus.  Neck: Normal range of motion. No JVD present. No thyromegaly present.  Tracheostomy in place  Cardiovascular: Normal rate, regular rhythm, normal heart sounds and intact distal pulses.  Exam reveals no gallop and no friction rub.   No murmur heard. Pulmonary/Chest: Effort normal and breath sounds normal.  Abdominal: Soft. Bowel sounds are normal. She exhibits no distension and no mass. There is no tenderness. There is no rebound and no guarding.  PEG in place w/o surrounding erythema, warmth, drainage  Musculoskeletal: Normal range of motion. She exhibits no edema and no tenderness.  Right shoulder dislocation chronic  Lymphadenopathy:    She has no cervical adenopathy.  Neurological: She is alert. Coordination normal.  blind  Skin: Skin is warm and dry. She is not diaphoretic.  Psychiatric:  irritable   Labs:  Wbc 11.4, hgb 10.5, plts 156, Na 136, K 4.5, BUN  32, cr 0.67, glucose 124 Diagnostic studies in [facility] system reviewed;  [facility] bronchoscopy with vocal cord edema improved, no masses Functional assessment: dependent in bathing, dressing, now on tube feeding, needing help with oral feeding and even with sitting up Prognosis for survival:  Poor  Plan: 1. Acute and chronic respiratory failure --has baseline COPD --required multiple intubations at [facility], had significant vocal cord edema and right bronchial ring hypertrophy suggesting chronic aspiration--required tracheostomy before [facility] --stable here now from respiratory standpoint--decannulated 5/16  2. Aspiration pneumonia --was treated with zosyn  [facility] and completed course --requires [facility] elevated and close monitoring during any po intake --to work with speech therapy here  3. Bipolar affective disorder --has been a problem for many years according to her son, Morton Peters --has periods where her mood is very good, and others were she is miserable --cont effexor  4. COPD (chronic obstructive pulmonary disease) --moderate, stable now  5. Dementia, without behavioral disturbance --cont aricept and namenda  6. Essential hypertension, benign --stable on current therapy  7. Hypothyroidism --has been very difficult to get under control due to labs not getting drawn as ordered and hospital adjustments after dose had just been adjusted here at [facility] --f/u TSH and cont synthroid  8. Tracheostomy in place --due to vocal cord edema--has had workup for dysphagia and sore throat with bronchoscopy and ENT evaluations--both indicated resolution of the edema  9.  Dysphagia --persistent problem and contributing to protein-calorie malnutrition and edema--requires adjustments of tube feeding and working on increased oral intake, but pt very picky about what she will eat--does love ice cream  10.  Weight loss --multifactorial--dementia with failure to  thrive, fluctuant moods, acute illness now improving, ongoing dysphagia  Labs/studies:  F/u ENT, f/u TSH, BMP

## 2013-04-04 ENCOUNTER — Other Ambulatory Visit: Payer: Self-pay | Admitting: *Deleted

## 2013-04-04 MED ORDER — LORAZEPAM 2 MG/ML IJ SOLN: INTRAMUSCULAR | Status: DC

## 2013-04-24 ENCOUNTER — Non-Acute Institutional Stay (SKILLED_NURSING_FACILITY): Payer: Medicare Other | Admitting: Internal Medicine

## 2013-04-24 DIAGNOSIS — J449 Chronic obstructive pulmonary disease, unspecified: Secondary | ICD-10-CM

## 2013-04-24 DIAGNOSIS — E039 Hypothyroidism, unspecified: Secondary | ICD-10-CM

## 2013-04-24 DIAGNOSIS — Z930 Tracheostomy status: Secondary | ICD-10-CM

## 2013-04-24 DIAGNOSIS — Z299 Encounter for prophylactic measures, unspecified: Secondary | ICD-10-CM

## 2013-04-24 DIAGNOSIS — F319 Bipolar disorder, unspecified: Secondary | ICD-10-CM

## 2013-04-24 DIAGNOSIS — Z79899 Other long term (current) drug therapy: Secondary | ICD-10-CM

## 2013-04-24 DIAGNOSIS — J4489 Other specified chronic obstructive pulmonary disease: Secondary | ICD-10-CM

## 2013-04-24 DIAGNOSIS — Z7901 Long term (current) use of anticoagulants: Secondary | ICD-10-CM

## 2013-04-24 DIAGNOSIS — G8929 Other chronic pain: Secondary | ICD-10-CM

## 2013-04-24 NOTE — Progress Notes (Signed)
Patient ID: [name], female   DOB: 11-29-1927, 79 y.o.   MRN: [id number]  Chief Complaint: nursing question need for continuing heparin  HPI:  79 yo white female seen for acute visit and mgt of chronic diseases to reassess whether to continue heparin therapy.  She is on this for dvt prophylaxis after her long hospitalization with respiratory failure for which she required tracheostomy.    Review of Systems:  Review of Systems  Constitutional: Positive for weight loss and malaise/fatigue.  HENT: Negative for congestion.   Respiratory: Positive for cough and shortness of breath.   Cardiovascular: Negative for chest pain and leg swelling.  Gastrointestinal: Negative for abdominal pain and constipation.  Genitourinary: Negative for dysuria.  Musculoskeletal: Positive for back pain, myalgias and neck pain.  Skin: Negative for rash.  Neurological: Positive for weakness. Negative for loss of consciousness.  Psychiatric/Behavioral: Positive for depression and memory loss.     Medications: Patient's Medications  New Prescriptions   No medications on file  Previous Medications   ACETAMINOPHEN (TYLENOL) 325 MG TABLET    Take 650 mg by mouth every morning.   BACLOFEN (LIORESAL) 10 MG TABLET    Take 10 mg by mouth 3 (three) times daily.   DONEPEZIL (ARICEPT) 10 MG TABLET    Take 10 mg by mouth at bedtime.   DULOXETINE (CYMBALTA) 60 MG CAPSULE    Take 60 mg by mouth daily.   FOOD THICKENER (THICK IT) POWD    Take 1 Container by mouth as needed. Nectar thick liqiud   FUROSEMIDE (LASIX) 20 MG TABLET    Take 20 mg by mouth 2 (two) times daily.   LEVOTHYROXINE (SYNTHROID, LEVOTHROID) 150 MCG TABLET    Take 125 mcg by mouth daily before breakfast.    MEMANTINE (NAMENDA) 10 MG TABLET    Take 10 mg by mouth 2 (two) times daily.   MENTHOL-ZINC OXIDE 0.44-20.625 % OINT    Apply 1 application topically 3 (three) times daily. For skin protector   PANTOPRAZOLE SODIUM (PROTONIX) 40 MG/20 ML PACK     Place 40 mg into feeding tube daily.   POTASSIUM CHLORIDE SA (K-DUR,KLOR-CON) 20 MEQ TABLET    Take 20 mEq by mouth daily.   PSYLLIUM HUSK POWD    Take 5 mLs by mouth 2 (two) times daily.   QUETIAPINE (SEROQUEL) 25 MG TABLET    Take 12.5 mg by mouth at bedtime.  Modified Medications   Modified Medication Previous Medication   LORAZEPAM (ATIVAN) 0.5 MG TABLET LORazepam (ATIVAN) 0.5 MG tablet      1/2 by mouth every 8 hours as needed for anxiety    Take 0.5 mg by mouth every 8 (eight) hours as needed for anxiety.   OXYCODONE (OXY IR/ROXICODONE) 5 MG IMMEDIATE RELEASE TABLET oxyCODONE (OXY IR/ROXICODONE) 5 MG immediate release tablet      Take one tablet by mouth every 8 hours for pain    Take one tablet by mouth every 8 hours for pain  Discontinued Medications   ACETAMINOPHEN (TYLENOL) 325 MG TABLET    Take 650 mg by mouth every 4 (four) hours as needed. For pain    BENZTROPINE (COGENTIN) 0.5 MG TABLET    Take 0.5 mg by mouth at bedtime.   BETA CAROTENE W/MINERALS (OCUVITE) TABLET    Take 1 tablet by mouth daily.   CALCIUM CARBONATE (TUMS - DOSED IN MG ELEMENTAL CALCIUM) 500 MG CHEWABLE TABLET    Chew 1 tablet by mouth 3 (three) times daily.  DILTIAZEM (CARDIZEM) 120 MG TABLET    Take 120 mg by mouth daily.     DULOXETINE (CYMBALTA) 30 MG CAPSULE    Take 60 mg by mouth daily.    GUAIFENESIN-DEXTROMETHORPHAN (ROBITUSSIN DM) 100-10 MG/5ML SYRUP    Take 5 mLs by mouth 3 (three) times daily as needed. For cough    HYDROCODONE-ACETAMINOPHEN (NORCO/VICODIN) 5-325 MG PER TABLET    Take 1 tablet by mouth every 4 (four) hours as needed for pain.   IPRATROPIUM-ALBUTEROL (DUONEB) 0.5-2.5 (3) MG/3ML SOLN    Take 3 mLs by nebulization every 6 (six) hours as needed.   LACTULOSE (CHRONULAC) 10 GM/15ML SOLUTION    Take 10 g by mouth daily.   LORAZEPAM (ATIVAN) 2 MG/ML INJECTION    Inject 0.5 MG (0.25ML) Intramuscularly; every 6 hours as needed for agitation   MEGESTROL (MEGACE ES) 625 MG/5ML SUSPENSION     Take 625 mg by mouth daily.   MELATONIN 3 MG TABS    Take 3 mg by mouth at bedtime.   MELOXICAM (MOBIC) 7.5 MG TABLET    Take 7.5 mg by mouth daily.   MICONAZOLE (MICOTIN) 2 % POWDER    Apply topically as needed for itching.   PHENOL (CHLORASEPTIC) 1.4 % LIQD    Use as directed 2 sprays in the mouth or throat as needed. Sore throat   SENNA (SENOKOT) 8.6 MG TABS    Take 1 tablet by mouth 2 (two) times daily.     Physical Exam: Filed Vitals:   04/24/13 1559  BP: 111/75  Pulse: 83  Temp: 97.6 F (36.4 C)  Resp: 20   Physical Exam  Constitutional:  Frail white female, nad  Neck:  tracheostomy  Cardiovascular: Normal rate, regular rhythm, normal heart sounds and intact distal pulses.   Pulmonary/Chest: Effort normal.  Coarse wet rhonchi  Abdominal: Soft. Bowel sounds are normal. She exhibits no distension.  Musculoskeletal: She exhibits no edema.  Neurological: She is alert.  Skin: There is erythema.      Labs reviewed: Basic Metabolic Panel:  Recent Labs  16/10/96 2015  03/01/13 0528  03/25/13 0500 03/27/13 0711 04/01/13 0515  NA 137  < > 140  < > 138 135 136  K 4.6  < > 3.6  < > 3.3* 4.0 4.5  CL 104  < > 106  < > 105 104 100  CO2 21  < > 27  < > 23 19 26   GLUCOSE 261*  < > 115*  < > 113* 122* 124*  BUN 36*  < > 33*  < > 40* 35* 32*  CREATININE 1.26*  < > 0.70  < > 0.67 0.73 0.67  CALCIUM 8.8  < > 8.5  < > 8.5 8.8 9.5  MG 2.4  --  2.1  --   --   --   --   < > = values in this interval not displayed.  Liver Function Tests:  Recent Labs  03/01/13 0528 03/07/13 0700 03/19/13 0615  AST 13 18 26   ALT 13 16 33  ALKPHOS 56 63 82  BILITOT 0.3 0.3 0.3  PROT 5.2* 6.5 6.7  ALBUMIN 2.3* 2.6* 2.7*    CBC:  Recent Labs  03/07/13 0700  03/21/13 0538 03/21/13 1140  03/27/13 0711 03/30/13 1532 04/01/13 0515  WBC 11.5*  < > 8.2 11.2*  < > 11.9* 11.5* 11.4*  NEUTROABS 9.2*  --  5.7 7.3  --   --   --   --  HGB 11.9*  < > 9.3* 12.3  < > 10.3* 11.0* 10.5*   HCT 34.4*  < > 27.6* 36.5  < > 30.9* 32.0* 31.2*  MCV 101.8*  < > 102.6* 102.5*  < > 102.0* 101.3* 102.0*  PLT 212  < > 228 324  < > 197 176 256  < > = values in this interval not displayed.  Assessment/Plan 1. COPD (chronic obstructive pulmonary disease) -with recent respiratory failure, has trach, doing better from this standpoint  2. Bipolar affective disorder -moods worse, more agitated and refusing care at times, needs to be seen by nceps  3. Tracheostomy in place -due to acute on chronic respiratory failure, improving in terms of oxygen needs  4. Chronic pain -longstanding, is on cymbalta due to psychosomatic component  5. DVT prophylaxis -will d/c heparin -I doubt her mobility will improve much from its current state considering her frailty, poor po intake, malnutrition and depression and the tid injections are not good for her quality of life at this point  6. Hypothyroidism -cont synthroid, f/u tsh

## 2013-05-08 ENCOUNTER — Non-Acute Institutional Stay (SKILLED_NURSING_FACILITY): Payer: Medicare Other | Admitting: Internal Medicine

## 2013-05-08 DIAGNOSIS — J312 Chronic pharyngitis: Secondary | ICD-10-CM

## 2013-05-08 DIAGNOSIS — F0390 Unspecified dementia without behavioral disturbance: Secondary | ICD-10-CM

## 2013-05-08 DIAGNOSIS — E039 Hypothyroidism, unspecified: Secondary | ICD-10-CM

## 2013-05-08 DIAGNOSIS — R634 Abnormal weight loss: Secondary | ICD-10-CM

## 2013-05-08 DIAGNOSIS — F319 Bipolar disorder, unspecified: Secondary | ICD-10-CM

## 2013-05-08 NOTE — Progress Notes (Signed)
Patient ID: [name], female   DOB: January 09, 1928, 79 y.o.   MRN: [id number] Location:  Location:  [facility] Provider:  Gwenith Spitz. Renato Gails, D.O., C.M.D.  Code Status:  full Chief Complaint: left hand swelling HPI: Unfortunate 79 yo female with h/o bipolar disorder, dementia, and recent acute respiratory failure that had required tracheostomy and ventilation was seen for an acute visit due to left hand swelling.  She denies any pain in her hand.  Her right is also swollen.  She only c/o her sore throat.  Previously during her hospitalizations, she had been seen by ENT for possible mass on the vocal cords, but upon repeat exam nothing could be seen though there was evidence of scarring and irritation.  She continues to complain about this.    Upon discussion with the nursing staff and dietitian assistant, she is continuing to lose weight.  Her speech therapist expresses concern about poor po intake though she does eat things she likes like ice cream/magic cups.  She is on tube feeding but low dose providing only 810 calories.  She is said to be eating 46% per nursing records.  Review of Systems:  Review of Systems  Constitutional: Positive for weight loss and malaise/fatigue. Negative for fever.  HENT: Positive for sore throat.   Respiratory: Negative for shortness of breath, wheezing and stridor.   Cardiovascular: Negative for chest pain.  Gastrointestinal: Negative for abdominal pain.       Swelling superficially beneath skin of extremities  Psychiatric/Behavioral: Positive for depression and memory loss.   Medications: Patient's Medications  New Prescriptions   No medications on file  Previous Medications   ACETAMINOPHEN (TYLENOL) 325 MG TABLET    Take 650 mg by mouth every 4 (four) hours as needed. For pain    BENZTROPINE (COGENTIN) 0.5 MG TABLET    Take 0.5 mg by mouth at bedtime.   BETA CAROTENE W/MINERALS (OCUVITE) TABLET    Take 1 tablet by mouth daily.   CALCIUM  CARBONATE (TUMS - DOSED IN MG ELEMENTAL CALCIUM) 500 MG CHEWABLE TABLET    Chew 1 tablet by mouth 3 (three) times daily.     DILTIAZEM (CARDIZEM) 120 MG TABLET    Take 120 mg by mouth daily.     DULOXETINE (CYMBALTA) 30 MG CAPSULE    Take 60 mg by mouth daily.    FOOD THICKENER (THICK IT) POWD    Take 1 Container by mouth as needed. Nectar thick liqiud   GUAIFENESIN-DEXTROMETHORPHAN (ROBITUSSIN DM) 100-10 MG/5ML SYRUP    Take 5 mLs by mouth 3 (three) times daily as needed. For cough    HYDROCODONE-ACETAMINOPHEN (NORCO/VICODIN) 5-325 MG PER TABLET    Take 1 tablet by mouth every 4 (four) hours as needed for pain.   IPRATROPIUM-ALBUTEROL (DUONEB) 0.5-2.5 (3) MG/3ML SOLN    Take 3 mLs by nebulization every 6 (six) hours as needed.   LACTULOSE (CHRONULAC) 10 GM/15ML SOLUTION    Take 10 g by mouth daily.   LEVOTHYROXINE (SYNTHROID, LEVOTHROID) 150 MCG TABLET    Take 150 mcg by mouth daily before breakfast.   LORAZEPAM (ATIVAN) 2 MG/ML INJECTION    Inject 0.5 MG (0.25ML) Intramuscularly; every 6 hours as needed for agitation   MEGESTROL (MEGACE ES) 625 MG/5ML SUSPENSION    Take 625 mg by mouth daily.   MELATONIN 3 MG TABS    Take 3 mg by mouth at bedtime.   MELOXICAM (MOBIC) 7.5 MG TABLET    Take 7.5 mg by  mouth daily.   MICONAZOLE (MICOTIN) 2 % POWDER    Apply topically as needed for itching.   PHENOL (CHLORASEPTIC) 1.4 % LIQD    Use as directed 2 sprays in the mouth or throat as needed. Sore throat   SENNA (SENOKOT) 8.6 MG TABS    Take 1 tablet by mouth 2 (two) times daily.  Modified Medications   No medications on file  Discontinued Medications   No medications on file    Physical Exam: There were no vitals filed for this visit. Physical Exam  Constitutional:  Frail caucasian female, NAD resting in bed  HENT:  Head: Normocephalic and atraumatic.  Mouth/Throat: Oropharynx is clear and moist. No oropharyngeal exudate.  Cardiovascular: Normal rate, regular rhythm and normal heart sounds.    Pulmonary/Chest: Effort normal and breath sounds normal. No respiratory distress.  Abdominal: Soft. Bowel sounds are normal.  PEG in place w/o erythema or warmth present, no drainage  Neurological: She is alert.  Oriented to person only  Skin: Skin is warm and dry.  Face pink;  Has subcutaneous edema of extremities   Labs reviewed: Basic Metabolic Panel:  Recent Labs  16/10/96 2015  03/01/13 0528  03/25/13 0500 03/27/13 0711 04/01/13 0515  NA 137  < > 140  < > 138 135 136  K 4.6  < > 3.6  < > 3.3* 4.0 4.5  CL 104  < > 106  < > 105 104 100  CO2 21  < > 27  < > 23 19 26   GLUCOSE 261*  < > 115*  < > 113* 122* 124*  BUN 36*  < > 33*  < > 40* 35* 32*  CREATININE 1.26*  < > 0.70  < > 0.67 0.73 0.67  CALCIUM 8.8  < > 8.5  < > 8.5 8.8 9.5  MG 2.4  --  2.1  --   --   --   --   < > = values in this interval not displayed.  Liver Function Tests:  Recent Labs  03/01/13 0528 03/07/13 0700 03/19/13 0615  AST 13 18 26   ALT 13 16 33  ALKPHOS 56 63 82  BILITOT 0.3 0.3 0.3  PROT 5.2* 6.5 6.7  ALBUMIN 2.3* 2.6* 2.7*    CBC:  Recent Labs  03/07/13 0700  03/21/13 0538 03/21/13 1140  03/27/13 0711 03/30/13 1532 04/01/13 0515  WBC 11.5*  < > 8.2 11.2*  < > 11.9* 11.5* 11.4*  NEUTROABS 9.2*  --  5.7 7.3  --   --   --   --   HGB 11.9*  < > 9.3* 12.3  < > 10.3* 11.0* 10.5*  HCT 34.4*  < > 27.6* 36.5  < > 30.9* 32.0* 31.2*  MCV 101.8*  < > 102.6* 102.5*  < > 102.0* 101.3* 102.0*  PLT 212  < > 228 324  < > 197 176 256  < > = values in this interval not displayed.  Assessment/Plan Dementia Not helping her to gain weight as this progresses.  She seems to lack the desire to eat.  Has previously taken remeron for this.  Bipolar affective disorder Stable with cymbalta and anxiolytics.    Weight loss Continues.  She is taking less than 1/2 of her meals, does eat some snacks.  Is on tube feeding for only 810 calories, but goal was for her to eat more.  I suspect her edema is due  to malnutrition.  Would like to check prealbumin.  Chronic pharyngitis I would like her to see ENT again for confirmation that there is nothing harmful in her larynx considering her symptoms had improved, and are now worse again since her trach was removed.    Hypothyroidism Has been difficult to control.  On synthroid right now.  Kept getting adjusted at the hospital right after we adjusted it at the facility so her TSH has been all over the board.  Has f/u TSH 05/15/13.     Family/ staff Communication: discussed her condition with nursing staff, speech therapy and dietitian assistant today  Goals of care: full code  Labs/tests ordered:  F/u with ENT, need to order prealbumin, has TSH for 7/2

## 2013-05-11 ENCOUNTER — Encounter: Payer: Self-pay | Admitting: Internal Medicine

## 2013-05-11 DIAGNOSIS — J312 Chronic pharyngitis: Secondary | ICD-10-CM | POA: Insufficient documentation

## 2013-05-11 NOTE — Assessment & Plan Note (Signed)
Not helping her to gain weight as this progresses.  She seems to lack the desire to eat.  Has previously taken remeron for this.

## 2013-05-11 NOTE — Assessment & Plan Note (Signed)
I would like her to see ENT again for confirmation that there is nothing harmful in her larynx considering her symptoms had improved, and are now worse again since her trach was removed.

## 2013-05-11 NOTE — Assessment & Plan Note (Signed)
Stable with cymbalta and anxiolytics.

## 2013-05-11 NOTE — Assessment & Plan Note (Signed)
Continues.  She is taking less than 1/2 of her meals, does eat some snacks.  Is on tube feeding for only 810 calories, but goal was for her to eat more.  I suspect her edema is due to malnutrition.  Would like to check prealbumin.

## 2013-05-11 NOTE — Assessment & Plan Note (Signed)
Has been difficult to control.  On synthroid right now.  Kept getting adjusted at the hospital right after we adjusted it at the facility so her TSH has been all over the board.  Has f/u TSH 05/15/13.

## 2013-05-21 ENCOUNTER — Non-Acute Institutional Stay (SKILLED_NURSING_FACILITY): Payer: Medicare Other | Admitting: Adult Health

## 2013-05-21 DIAGNOSIS — K5900 Constipation, unspecified: Secondary | ICD-10-CM

## 2013-05-21 DIAGNOSIS — E039 Hypothyroidism, unspecified: Secondary | ICD-10-CM

## 2013-05-21 DIAGNOSIS — E119 Type 2 diabetes mellitus without complications: Secondary | ICD-10-CM

## 2013-05-21 DIAGNOSIS — F0390 Unspecified dementia without behavioral disturbance: Secondary | ICD-10-CM

## 2013-05-21 DIAGNOSIS — F319 Bipolar disorder, unspecified: Secondary | ICD-10-CM

## 2013-05-21 DIAGNOSIS — L0291 Cutaneous abscess, unspecified: Secondary | ICD-10-CM

## 2013-05-21 DIAGNOSIS — K219 Gastro-esophageal reflux disease without esophagitis: Secondary | ICD-10-CM

## 2013-05-21 DIAGNOSIS — R609 Edema, unspecified: Secondary | ICD-10-CM

## 2013-05-21 DIAGNOSIS — R1310 Dysphagia, unspecified: Secondary | ICD-10-CM

## 2013-05-21 DIAGNOSIS — F068 Other specified mental disorders due to known physiological condition: Secondary | ICD-10-CM

## 2013-05-28 ENCOUNTER — Non-Acute Institutional Stay (SKILLED_NURSING_FACILITY): Payer: Medicare Other | Admitting: Adult Health

## 2013-05-28 DIAGNOSIS — R1310 Dysphagia, unspecified: Secondary | ICD-10-CM

## 2013-05-28 DIAGNOSIS — R609 Edema, unspecified: Secondary | ICD-10-CM

## 2013-05-28 DIAGNOSIS — K5900 Constipation, unspecified: Secondary | ICD-10-CM

## 2013-05-28 DIAGNOSIS — K219 Gastro-esophageal reflux disease without esophagitis: Secondary | ICD-10-CM

## 2013-05-28 DIAGNOSIS — F0390 Unspecified dementia without behavioral disturbance: Secondary | ICD-10-CM

## 2013-05-28 DIAGNOSIS — E119 Type 2 diabetes mellitus without complications: Secondary | ICD-10-CM

## 2013-05-28 DIAGNOSIS — E039 Hypothyroidism, unspecified: Secondary | ICD-10-CM

## 2013-05-28 DIAGNOSIS — F068 Other specified mental disorders due to known physiological condition: Secondary | ICD-10-CM

## 2013-05-28 DIAGNOSIS — F319 Bipolar disorder, unspecified: Secondary | ICD-10-CM

## 2013-06-10 ENCOUNTER — Encounter (HOSPITAL_COMMUNITY): Payer: Self-pay | Admitting: Emergency Medicine

## 2013-06-10 ENCOUNTER — Emergency Department (HOSPITAL_COMMUNITY)
Admission: EM | Admit: 2013-06-10 | Discharge: 2013-06-10 | Disposition: A | Discharge status: Skilled Nursing Facility | Payer: Medicare Other | Attending: Emergency Medicine | Admitting: Emergency Medicine

## 2013-06-10 ENCOUNTER — Emergency Department (HOSPITAL_COMMUNITY): Payer: Medicare Other

## 2013-06-10 DIAGNOSIS — I4892 Unspecified atrial flutter: Secondary | ICD-10-CM | POA: Insufficient documentation

## 2013-06-10 DIAGNOSIS — W050XXA Fall from non-moving wheelchair, initial encounter: Secondary | ICD-10-CM | POA: Insufficient documentation

## 2013-06-10 DIAGNOSIS — Z950 Presence of cardiac pacemaker: Secondary | ICD-10-CM | POA: Insufficient documentation

## 2013-06-10 DIAGNOSIS — N289 Disorder of kidney and ureter, unspecified: Secondary | ICD-10-CM | POA: Insufficient documentation

## 2013-06-10 DIAGNOSIS — Z87891 Personal history of nicotine dependence: Secondary | ICD-10-CM | POA: Insufficient documentation

## 2013-06-10 DIAGNOSIS — Z79899 Other long term (current) drug therapy: Secondary | ICD-10-CM | POA: Insufficient documentation

## 2013-06-10 DIAGNOSIS — T148XXA Other injury of unspecified body region, initial encounter: Secondary | ICD-10-CM

## 2013-06-10 DIAGNOSIS — I509 Heart failure, unspecified: Secondary | ICD-10-CM | POA: Insufficient documentation

## 2013-06-10 DIAGNOSIS — E079 Disorder of thyroid, unspecified: Secondary | ICD-10-CM | POA: Insufficient documentation

## 2013-06-10 DIAGNOSIS — I10 Essential (primary) hypertension: Secondary | ICD-10-CM | POA: Insufficient documentation

## 2013-06-10 DIAGNOSIS — Y9210 Unspecified residential institution as the place of occurrence of the external cause: Secondary | ICD-10-CM | POA: Insufficient documentation

## 2013-06-10 DIAGNOSIS — S0083XA Contusion of other part of head, initial encounter: Secondary | ICD-10-CM | POA: Insufficient documentation

## 2013-06-10 DIAGNOSIS — S0003XA Contusion of scalp, initial encounter: Secondary | ICD-10-CM | POA: Insufficient documentation

## 2013-06-10 DIAGNOSIS — Y939 Activity, unspecified: Secondary | ICD-10-CM | POA: Insufficient documentation

## 2013-06-10 DIAGNOSIS — W19XXXA Unspecified fall, initial encounter: Secondary | ICD-10-CM

## 2013-06-10 DIAGNOSIS — J449 Chronic obstructive pulmonary disease, unspecified: Secondary | ICD-10-CM | POA: Insufficient documentation

## 2013-06-10 DIAGNOSIS — J4489 Other specified chronic obstructive pulmonary disease: Secondary | ICD-10-CM | POA: Insufficient documentation

## 2013-06-10 NOTE — ED Notes (Signed)
Pt to ED via EMS from [facility] for fall. Hematoma noted at left forehead. Pt evaluated by a provider in the facility and wound sterile-stripped. Pt send to ED for CT scan. Pt is wheelchair bound, got up and fall into her face. BP-133/82, pulse-100, RR-18. Pt not taking blood thinner.

## 2013-06-10 NOTE — ED Provider Notes (Signed)
Medical screening examination/treatment/procedure(s) were conducted as a shared visit with non-physician practitioner(s) and myself.  I personally evaluated the patient during the encounter  Patient with left frontal hematoma after a fall. CT scan negative, remainder of exam unremarkable. Discharge back to nursing home.  Gilda Crease, MD 06/10/13 2137

## 2013-06-10 NOTE — ED Provider Notes (Signed)
CSN: [id number]     Arrival date & time 06/10/13  1932 History     First MD Initiated Contact with Patient 06/10/13 1950     Chief Complaint  Patient presents with  . Fall   (Consider location/radiation/quality/duration/timing/severity/associated sxs/prior Treatment) HPI Comments:  Patient sent from [facility] after fall from wheelchair to the floor Hitting her forehead on the floor   Seen by facility MD who placed steri strips to laceration in Hematoma   The are requesting CT of head and cervical spine.  Patient is non verbal and can not add to history        Patient is a 79 y.o. female presenting with fall. The history is provided by the EMS personnel. The history is limited by the condition of the patient.  Fall This is a new problem. The current episode started today.    Past Medical History  Diagnosis Date  . COPD (chronic obstructive pulmonary disease)   . Hypertension   . Blind   . Hypothyroid   . Pacemaker   . Renal insufficiency   . Finger fracture   . Dementia   . Bipolar disorder   . Dysphagia     needs thick nectar liquids  . Atrial fibrillation   . Pneumonia   . Anxiety   . Diastolic heart failure    Past Surgical History  Procedure Laterality Date  . Insert / replace / remove pacemaker     History reviewed. No pertinent family history. History  Substance Use Topics  . Smoking status: Former Smoker -- .5 years    Types: Cigarettes  . Smokeless tobacco: Not on file  . Alcohol Use: Not on file   OB History   Grav Para Term Preterm Abortions TAB SAB Ect Mult Living                 Review of Systems  Unable to perform ROS: Patient nonverbal  Skin: Positive for wound.  All other systems reviewed and are negative.    Allergies  Review of patient's allergies indicates no known allergies.  Home Medications   Current Outpatient Rx  Name  Route  Sig  Dispense  Refill  . cephALEXin (KEFLEX) 500 MG capsule   Oral   Take 500 mg by mouth 3 (three) times  daily.         Marland Kitchen donepezil (ARICEPT) 10 MG tablet   Oral   Take 10 mg by mouth at bedtime.         . furosemide (LASIX) 20 MG tablet   Oral   Take 20 mg by mouth 2 (two) times daily.         . insulin lispro (HUMALOG) 100 UNIT/ML injection   Subcutaneous   Inject 3 Units into the skin 3 (three) times daily before meals.         Marland Kitchen levothyroxine (SYNTHROID, LEVOTHROID) 150 MCG tablet   Oral   Take 150 mcg by mouth daily before breakfast.         . LORazepam (ATIVAN) 0.5 MG tablet   Oral   Take 0.5 mg by mouth every 8 (eight) hours as needed for anxiety.         . memantine (NAMENDA) 10 MG tablet   Oral   Take 10 mg by mouth 2 (two) times daily.         . Menthol-Zinc Oxide 0.44-20.625 % OINT   Apply externally   Apply 1 application topically 3 (three) times daily. For  skin protector         . mirtazapine (REMERON) 7.5 MG tablet   Oral   Take 7.5 mg by mouth at bedtime.         Marland Kitchen oxyCODONE (OXY IR/ROXICODONE) 5 MG immediate release tablet   Oral   Take 5 mg by mouth every 8 (eight) hours as needed for pain (anxiety).         . pantoprazole sodium (PROTONIX) 40 mg/20 mL PACK   Per Tube   Place 40 mg into feeding tube daily.         . potassium chloride SA (K-DUR,KLOR-CON) 20 MEQ tablet   Oral   Take 20 mEq by mouth daily.         . Psyllium Husk POWD   Oral   Take 5 mLs by mouth 2 (two) times daily.         . QUEtiapine (SEROQUEL) 25 MG tablet   Oral   Take 12.5 mg by mouth at bedtime.         Marland Kitchen saccharomyces boulardii (FLORASTOR) 250 MG capsule   Oral   Take 250 mg by mouth 2 (two) times daily.         Marland Kitchen venlafaxine (EFFEXOR) 37.5 MG tablet   Oral   Take 37.5 mg by mouth 2 (two) times daily.         . food thickener (THICK IT) POWD   Oral   Take 1 Container by mouth as needed. Nectar thick liqiud          BP 127/77  Pulse 68  Temp(Src) 98.3 F (36.8 C) (Oral)  Resp 20  SpO2 93% Physical Exam  Vitals  reviewed. Constitutional: She appears well-developed and well-nourished. No distress.  HENT:  Head: Normocephalic.    Right Ear: External ear normal.  Left Ear: External ear normal.  Eyes: Pupils are equal, round, and reactive to light.  Neck:  Atrophy and shortened held in rigid position   Cardiovascular: Normal rate and regular rhythm.   Pulmonary/Chest: Effort normal and breath sounds normal.  Abdominal: Soft. Bowel sounds are normal. She exhibits no distension. There is no tenderness.  Musculoskeletal: She exhibits no edema and no tenderness.  No deformities notes   Skin: Skin is warm. No erythema.    ED Course   Procedures (including critical care time)  Labs Reviewed - No data to display Ct Head Wo Contrast  06/10/2013   *RADIOLOGY REPORT*  Clinical Data:  Wheelchair bound patient who fell when getting out of the wheelchair earlier today, striking the left frontal region.  CT HEAD WITHOUT CONTRAST CT CERVICAL SPINE WITHOUT CONTRAST  Technique:  Multidetector CT imaging of the head and cervical spine was performed following the standard protocol without intravenous contrast.  Multiplanar CT image reconstructions of the cervical spine were also generated.  Comparison:  CT head 02/24/2013, 08/16/2012, 08/15/2012.  CT cervical spine 08/15/2012.  CT HEAD  Findings: Patient motion blurred several of the images initially but these were repeated and a diagnostic study was obtained.  Mild to moderate cortical atrophy, unchanged.  Ventricular system normal in size and appearance for age.  Calcification within the correlate plexus of the choroidal fissures bilaterally.  No mass lesion.  No midline shift.  No acute hemorrhage or hematoma.  No extra-axial fluid collections.  No evidence of acute infarction.  Large left frontal scalp hematoma without underlying skull fracture. Visualized paranasal sinuses, bilateral mastoid air cells, and bilateral middle ear cavities well-aerated.  Bilateral  carotid siphon atherosclerosis.  IMPRESSION:  1.  No acute intracranial abnormality. 2.  Stable mild to moderate cortical atrophy. 3.  Left frontal scalp hematoma without underlying skull fracture.  CT CERVICAL SPINE  Findings: Osseous demineralization.  No fractures identified involving the cervical spine.  Exaggeration of the usual lordosis, likely secondary to an exaggerated thoracic kyphosis.  Severe disc space narrowing at C4-5 and C6-7.  Moderate disc space narrowing at C3-4 and C5-6.  Facet joints intact throughout with degenerative changes.  Severe degenerative changes at C1-2 with calcified pannus posteriorly.  Coronal reformatted images demonstrate an intact dens and intact lateral masses throughout.  Combination of facet and uncinate hypertrophy account for severe bilateral foraminal stenoses at C4-5, C5-6, and C6-7.  No evidence of spinal stenosis.  IMPRESSION:  1.  No cervical spine fractures identified. 2.  Generalized osseous demineralization.  3.  Multilevel degenerative changes as detailed above with multilevel foraminal stenoses.   Original Report Authenticated By: Hulan Saas, M.D.   Ct Cervical Spine Wo Contrast  06/10/2013   *RADIOLOGY REPORT*  Clinical Data:  Wheelchair bound patient who fell when getting out of the wheelchair earlier today, striking the left frontal region.  CT HEAD WITHOUT CONTRAST CT CERVICAL SPINE WITHOUT CONTRAST  Technique:  Multidetector CT imaging of the head and cervical spine was performed following the standard protocol without intravenous contrast.  Multiplanar CT image reconstructions of the cervical spine were also generated.  Comparison:  CT head 02/24/2013, 08/16/2012, 08/15/2012.  CT cervical spine 08/15/2012.  CT HEAD  Findings: Patient motion blurred several of the images initially but these were repeated and a diagnostic study was obtained.  Mild to moderate cortical atrophy, unchanged.  Ventricular system normal in size and appearance for age.   Calcification within the correlate plexus of the choroidal fissures bilaterally.  No mass lesion.  No midline shift.  No acute hemorrhage or hematoma.  No extra-axial fluid collections.  No evidence of acute infarction.  Large left frontal scalp hematoma without underlying skull fracture. Visualized paranasal sinuses, bilateral mastoid air cells, and bilateral middle ear cavities well-aerated.  Bilateral carotid siphon atherosclerosis.  IMPRESSION:  1.  No acute intracranial abnormality. 2.  Stable mild to moderate cortical atrophy. 3.  Left frontal scalp hematoma without underlying skull fracture.  CT CERVICAL SPINE  Findings: Osseous demineralization.  No fractures identified involving the cervical spine.  Exaggeration of the usual lordosis, likely secondary to an exaggerated thoracic kyphosis.  Severe disc space narrowing at C4-5 and C6-7.  Moderate disc space narrowing at C3-4 and C5-6.  Facet joints intact throughout with degenerative changes.  Severe degenerative changes at C1-2 with calcified pannus posteriorly.  Coronal reformatted images demonstrate an intact dens and intact lateral masses throughout.  Combination of facet and uncinate hypertrophy account for severe bilateral foraminal stenoses at C4-5, C5-6, and C6-7.  No evidence of spinal stenosis.  IMPRESSION:  1.  No cervical spine fractures identified. 2.  Generalized osseous demineralization.  3.  Multilevel degenerative changes as detailed above with multilevel foraminal stenoses.   Original Report Authenticated By: Hulan Saas, M.D.   1. Fall, initial encounter   2. Hematoma     MDM  Hematoma to forehead with steri strips in place in no apparent discomfort with no noted deformities to extremities had head and cervical spine CT Scans with no abnormal pathology noted  Arman Filter, NP 06/10/13 2134  Arman Filter, NP 06/10/13 2134

## 2013-06-14 ENCOUNTER — Other Ambulatory Visit: Payer: Self-pay | Admitting: Geriatric Medicine

## 2013-06-14 MED ORDER — OXYCODONE HCL 5 MG PO TABS: ORAL_TABLET | ORAL | Status: DC

## 2013-06-14 MED ORDER — OXYCODONE HCL 5 MG PO TABS: 5.0000 mg | ORAL_TABLET | Freq: Four times a day (QID) | ORAL | Status: DC | PRN

## 2013-06-21 ENCOUNTER — Other Ambulatory Visit (HOSPITAL_COMMUNITY): Payer: Self-pay | Admitting: Internal Medicine

## 2013-06-21 ENCOUNTER — Encounter: Payer: Self-pay | Admitting: Internal Medicine

## 2013-06-21 DIAGNOSIS — R1310 Dysphagia, unspecified: Secondary | ICD-10-CM

## 2013-07-08 ENCOUNTER — Other Ambulatory Visit: Payer: Self-pay

## 2013-07-08 ENCOUNTER — Other Ambulatory Visit: Payer: Self-pay | Admitting: *Deleted

## 2013-07-08 MED ORDER — OXYCODONE HCL 5 MG PO TABS: ORAL_TABLET | ORAL | Status: DC

## 2013-07-12 ENCOUNTER — Other Ambulatory Visit (HOSPITAL_COMMUNITY): Payer: Medicare Other

## 2013-07-12 ENCOUNTER — Ambulatory Visit (HOSPITAL_COMMUNITY): Payer: Medicare Other

## 2013-07-17 ENCOUNTER — Other Ambulatory Visit (HOSPITAL_COMMUNITY): Payer: Medicare Other

## 2013-07-17 ENCOUNTER — Ambulatory Visit (HOSPITAL_COMMUNITY): Payer: Medicare Other

## 2013-07-19 ENCOUNTER — Other Ambulatory Visit (HOSPITAL_COMMUNITY): Payer: Self-pay | Admitting: Internal Medicine

## 2013-07-19 DIAGNOSIS — R1310 Dysphagia, unspecified: Secondary | ICD-10-CM

## 2013-07-22 ENCOUNTER — Non-Acute Institutional Stay (SKILLED_NURSING_FACILITY): Payer: Medicare Other | Admitting: Internal Medicine

## 2013-07-22 ENCOUNTER — Encounter: Payer: Self-pay | Admitting: Internal Medicine

## 2013-07-22 DIAGNOSIS — J449 Chronic obstructive pulmonary disease, unspecified: Secondary | ICD-10-CM

## 2013-07-22 DIAGNOSIS — F319 Bipolar disorder, unspecified: Secondary | ICD-10-CM

## 2013-07-22 DIAGNOSIS — J4489 Other specified chronic obstructive pulmonary disease: Secondary | ICD-10-CM

## 2013-07-22 DIAGNOSIS — E039 Hypothyroidism, unspecified: Secondary | ICD-10-CM

## 2013-07-22 DIAGNOSIS — F0390 Unspecified dementia without behavioral disturbance: Secondary | ICD-10-CM

## 2013-07-22 DIAGNOSIS — J312 Chronic pharyngitis: Secondary | ICD-10-CM

## 2013-07-22 NOTE — Progress Notes (Signed)
Patient ID: [name], female   DOB: 1928-02-28, 79 y.o.   MRN: [id number] Location:  [facility] Provider:  Gwenith Spitz. Renato Gails, D.O., C.M.D.  Code Status:  Full code  Chief Complaint  Patient presents with  . 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  Since her prolonged hospitalization, she has continued to complain of dysphagia, odynophagia and globus sensation.  She has been set up for a barium swallow, but has yet to have the test.  She's been very irritable with staff and gives short answers.    Review of Systems:  Review of Systems  Constitutional: Positive for malaise/fatigue.  HENT: Positive for sore throat. Negative for congestion.   Respiratory: Positive for cough. Negative for shortness of breath and stridor.   Cardiovascular: Negative for chest pain.  Gastrointestinal: Negative for constipation.  Genitourinary: Negative for dysuria.  Musculoskeletal: Negative for myalgias.  Neurological: Negative for dizziness.  Psychiatric/Behavioral: Positive for depression and memory loss.    Medications: Patient's Medications  New Prescriptions   No medications on file  Previous Medications   CEPHALEXIN (KEFLEX) 500 MG CAPSULE    Take 500 mg by mouth 3 (three) times daily.   DONEPEZIL (ARICEPT) 10 MG TABLET    Take 10 mg by mouth at bedtime.   FOOD THICKENER (THICK IT) POWD    Take 1 Container by mouth as needed. Nectar thick liqiud   FUROSEMIDE (LASIX) 20 MG TABLET    Take 20 mg by mouth 2 (two) times daily.   INSULIN LISPRO (HUMALOG) 100 UNIT/ML INJECTION    Inject 3 Units into the skin 3 (three) times daily before meals.   LEVOTHYROXINE (SYNTHROID, LEVOTHROID) 150 MCG TABLET    Take 150 mcg by mouth daily before breakfast.   LORAZEPAM (ATIVAN) 0.5 MG TABLET    Take 0.5 mg by mouth  every 8 (eight) hours as needed for anxiety.   MEMANTINE (NAMENDA) 10 MG TABLET    Take 10 mg by mouth 2 (two) times daily.   MENTHOL-ZINC OXIDE 0.44-20.625 % OINT    Apply 1 application topically 3 (three) times daily. For skin protector   MIRTAZAPINE (REMERON) 7.5 MG TABLET    Take 7.5 mg by mouth at bedtime.   OXYCODONE (OXY IR/ROXICODONE) 5 MG IMMEDIATE RELEASE TABLET    Take one tablet by mouth every 8 hours for pain   PANTOPRAZOLE SODIUM (PROTONIX) 40 MG/20 ML PACK    Place 40 mg into feeding tube daily.   POTASSIUM CHLORIDE SA (K-DUR,KLOR-CON) 20 MEQ TABLET    Take 20 mEq by mouth daily.   PSYLLIUM HUSK POWD    Take 5 mLs by mouth 2 (two) times daily.   QUETIAPINE (SEROQUEL) 25 MG TABLET    Take 12.5 mg by mouth at bedtime.   SACCHAROMYCES BOULARDII (FLORASTOR) 250 MG CAPSULE    Take 250 mg by mouth 2 (two) times daily.   VENLAFAXINE (EFFEXOR) 37.5 MG TABLET    Take 37.5 mg by mouth 2 (two) times daily.  Modified Medications   No medications on file  Discontinued Medications   No medications on file    Physical Exam: Filed Vitals:   07/22/13 2024  BP: 136/98  Pulse: 71  Temp: 97.7 F (36.5 C)  Resp: 18  SpO2: 95%  Physical Exam  Constitutional:  Thin  white female  HENT:  Head: Normocephalic and atraumatic.  Eyes: EOM are normal. Pupils are equal, round, and reactive to light.  Cardiovascular: Normal rate, regular rhythm, normal heart sounds and intact distal pulses.   Pulmonary/Chest: Effort normal and breath sounds normal. No respiratory distress.  Abdominal: Soft. Bowel sounds are normal. She exhibits no distension. There is no tenderness.  Musculoskeletal: Normal range of motion.  Neurological: She is alert.  Skin: Skin is warm and dry.    Labs reviewed: Basic Metabolic Panel:  Recent Labs  16/10/96 2015  03/01/13 0528  03/25/13 0500 03/27/13 0711 04/01/13 0515  NA 137  < > 140  < > 138 135 136  K 4.6  < > 3.6  < > 3.3* 4.0 4.5  CL 104  < > 106  < > 105  104 100  CO2 21  < > 27  < > 23 19 26   GLUCOSE 261*  < > 115*  < > 113* 122* 124*  BUN 36*  < > 33*  < > 40* 35* 32*  CREATININE 1.26*  < > 0.70  < > 0.67 0.73 0.67  CALCIUM 8.8  < > 8.5  < > 8.5 8.8 9.5  MG 2.4  --  2.1  --   --   --   --   < > = values in this interval not displayed.  Liver Function Tests:  Recent Labs  03/01/13 0528 03/07/13 0700 03/19/13 0615  AST 13 18 26   ALT 13 16 33  ALKPHOS 56 63 82  BILITOT 0.3 0.3 0.3  PROT 5.2* 6.5 6.7  ALBUMIN 2.3* 2.6* 2.7*    CBC:  Recent Labs  03/07/13 0700  03/21/13 0538 03/21/13 1140  03/27/13 0711 03/30/13 1532 04/01/13 0515  WBC 11.5*  < > 8.2 11.2*  < > 11.9* 11.5* 11.4*  NEUTROABS 9.2*  --  5.7 7.3  --   --   --   --   HGB 11.9*  < > 9.3* 12.3  < > 10.3* 11.0* 10.5*  HCT 34.4*  < > 27.6* 36.5  < > 30.9* 32.0* 31.2*  MCV 101.8*  < > 102.6* 102.5*  < > 102.0* 101.3* 102.0*  PLT 212  < > 228 324  < > 197 176 256  < > = values in this interval not displayed. 06/11/13:  Wbc 9.8, h/h 12.4/36.9, plts 353, Na 142, K 4.2, BUN 27, cr 0.6 06/24/13:  TSH 0.139, free T4 1.87  Assessment/Plan 1. Chronic pharyngitis -for barium swallow previously ordered  2. Dementia, without behavioral disturbance -seems to be declining more recently with poor intake except things like ice cream -her son wants her to be around as long as possible in our last discussion  3. Bipolar affective disorder -longstanding problem for years--cont current therapy--may benefit from med adjustments by psych b/c of her moodiness recently and irritability with staff  4. COPD (chronic obstructive pulmonary disease) -stable, high risk of exacerbation from aspiration  5.  Hypothyroidism -decrease synthroid to , recheck levels in 6 wks  Family/ staff Communication: discussed with speech therapy and nursing staff Goals of care: remains full code  Labs/tests ordered:  Recheck tsh, free T4 in 6 wks

## 2013-07-26 ENCOUNTER — Other Ambulatory Visit (HOSPITAL_COMMUNITY): Payer: Medicare Other

## 2013-07-26 ENCOUNTER — Ambulatory Visit (HOSPITAL_COMMUNITY): Admission: RE | Admit: 2013-07-26 | Payer: Medicare Other | Source: Ambulatory Visit

## 2013-07-26 ENCOUNTER — Ambulatory Visit (HOSPITAL_COMMUNITY)
Admission: RE | Admit: 2013-07-26 | Discharge: 2013-07-26 | Disposition: A | Discharge status: Home / Self Care | Payer: Medicare Other | Source: Ambulatory Visit | Attending: Internal Medicine | Admitting: Internal Medicine

## 2013-07-26 DIAGNOSIS — IMO0001 Reserved for inherently not codable concepts without codable children: Secondary | ICD-10-CM | POA: Insufficient documentation

## 2013-07-26 DIAGNOSIS — R1310 Dysphagia, unspecified: Secondary | ICD-10-CM

## 2013-07-26 NOTE — Procedures (Signed)
Objective Swallowing Evaluation: Modified Barium Swallowing Study  Patient Details  Name: [name] MRN: [id number] Date of Birth: 1927/12/04  Today's Date: 07/26/2013 Time: 1320-1400 SLP Time Calculation (min): 40 min  Past Medical History:  Past Medical History  Diagnosis Date  . COPD (chronic obstructive pulmonary disease)   . Hypertension   . Blind   . Hypothyroid   . Pacemaker   . Renal insufficiency   . Finger fracture   . Dementia   . Bipolar disorder   . Dysphagia     needs thick nectar liquids  . Atrial fibrillation   . Pneumonia   . Anxiety   . Diastolic heart failure    Past Surgical History:  Past Surgical History  Procedure Laterality Date  . Insert / replace / remove pacemaker    . Tracheostomy  02/27/13    decannulated 03/29/13  . Gastrostomy tube placement  03/11/13   HPI:  [name] is a 79 yo woman who lives at a nursing home with pmh COPD, hypothyroidism, a-fib, dysphonia, trach, PEG and dysphagia.   Pt arrives for an outpatient MBS due to renewed complaints of globus, similar to complaints when vocal fold mass/injury found. Pt is currently on a dys 1 diet with nectar thick liquids.  She has now been decannulated, but per chart in late June continues to receive PEG feeds due to poor oral intake.  She was admitted in 4/14 due to the following complaint:  presented after dinner with some "gurggling noise" apart from baseline. Pt was apparently eating dinner and then seemed to be having some respiratory distress along with upper airway noise. Nurse was concerned for aspiration and pt was then intubated in ED 2/2 fatigue from respiratory distress. Then on 4/14- vocal cord edema and R bronchial ring hypertrophy. Trach on 4/17. Bronchoscopy during that stay showed extensive edema epiglottis and posterior arrytnoids, very difficult to eval cords because of edema - likely explains no phonation with trach now, difficult to tell if any concerning lesions. In previous  years pt had had mutliple MBS in 2012 shows no aspiration but indicates coughing without aspiraiton. Also indicates pt at risk of intermittent penetration due to respiraitory status and curved epiglottis. Pt does have a history of recurrent pna.      Assessment / Plan / Recommendation Clinical Impression  Dysphagia Diagnosis: Mild oral phase dysphagia;Mild pharyngeal phase dysphagia Clinical impression: Overall, pt presents with adequate oral and oropharyngeal function. Pt was resistent to attempt soft solids exclaiming "I dont have teeth!" though upper denture was in place. With a small taste of a granola bar, pt did utilize tongue to prepare and transit bolus funcitonally. Pharyngeal function was very good, there may have been a slight delay in swallow initation with large straw sips with an instance of flash penetration, but no aspiration observed. Given history, will defer to primary SLP at facility as to wheter pt can tolerate thin liquids. Today during this brief test, pt tolerated them very well and shows potential for upgrade if behavior is consistent at home. Of note, esophageal function appeared normal, no signs of stasis with solids or pill. Pt did not report any globus.     Treatment Recommendation  Defer treatment plan to SLP at (Comment) (SNF)    Diet Recommendation Dysphagia 1 (Puree);Thin liquid   Liquid Administration via: Cup Medication Administration: Whole meds with liquid Supervision: Trained caregiver to feed patient Compensations: Slow rate;Small sips/bites Postural Changes and/or Swallow Maneuvers: Seated upright 90 degrees;Out of bed  for meals    Other  Recommendations     Follow Up Recommendations  Skilled Nursing facility    Frequency and Duration        Pertinent Vitals/Pain NA    SLP Swallow Goals     General HPI: [name] is a 79 yo woman who lives at a nursing home with pmh COPD, hypothyroidism, a-fib, dysphonia, trach, PEG and dysphagia.   Pt  arrives for an outpatient MBS due to renewed complaints of globus, similar to complaints when vocal fold mass/injury found. Pt is currently on a dys 1 diet with nectar thick liquids.  She has now been decannulated, but per chart in late June continues to receive PEG feeds due to poor oral intake.  She was admitted in 4/14 due to the following complaint:  presented after dinner with some "gurggling noise" apart from baseline. Pt was apparently eating dinner and then seemed to be having some respiratory distress along with upper airway noise. Nurse was concerned for aspiration and pt was then intubated in ED 2/2 fatigue from respiratory distress. Then on 4/14- vocal cord edema and R bronchial ring hypertrophy. Trach on 4/17. Bronchoscopy during that stay showed extensive edema epiglottis and posterior arrytnoids, very difficult to eval cords because of edema - likely explains no phonation with trach now, difficult to tell if any concerning lesions. In previous years pt had had mutliple MBS in 2012 shows no aspiration but indicates coughing without aspiraiton. Also indicates pt at risk of intermittent penetration due to respiraitory status and curved epiglottis. Pt does have a history of recurrent pna.  Type of Study: Modified Barium Swallowing Study Reason for Referral: Objectively evaluate swallowing function Previous Swallow Assessment: see history Diet Prior to this Study: Dysphagia 1 (puree);Nectar-thick liquids Temperature Spikes Noted: N/A Respiratory Status: Room air History of Recent Intubation: No Behavior/Cognition: Alert;Cooperative;Pleasant mood;Confused Oral Cavity - Dentition: Dentures, top;Edentulous (bottom dentures not available) Oral Motor / Sensory Function: Within functional limits Self-Feeding Abilities: Total assist Patient Positioning: Upright in chair Baseline Vocal Quality: Hoarse Volitional Cough: Strong Volitional Swallow: Able to elicit Anatomy: Within functional  limits Pharyngeal Secretions: Not observed secondary MBS    Reason for Referral Objectively evaluate swallowing function   Oral Phase Oral Preparation/Oral Phase Oral Phase: Impaired Oral - Thin Oral - Thin Cup: Delayed oral transit (rocking behavior) Oral - Thin Straw: Delayed oral transit (rocking behavior) Oral - Solids Oral - Puree: Delayed oral transit Oral - Mechanical Soft:  (pt would not open mouth to attempt "I dont have teeth!")   Pharyngeal Phase Pharyngeal Phase Pharyngeal Phase: Impaired Pharyngeal - Thin Pharyngeal - Thin Cup: Within functional limits Pharyngeal - Thin Straw: Delayed swallow initiation Pharyngeal - Solids Pharyngeal - Puree: Within functional limits Pharyngeal - Mechanical Soft: Within functional limits  Cervical Esophageal Phase    GO         Functional Assessment Tool Used: clinical judgement Functional Limitations: Swallowing Swallow Current Status (U9811): At least 1 percent but less than 20 percent impaired, limited or restricted Swallow Goal Status [phone]): At least 1 percent but less than 20 percent impaired, limited or restricted Swallow Discharge Status [id number]): At least 1 percent but less than 20 percent impaired, limited or restricted   [facility], [place] CCC-SLP [phone]  Claudine Mouton 07/26/2013, 3:04 PM

## 2013-08-30 ENCOUNTER — Encounter: Payer: Self-pay | Admitting: Internal Medicine

## 2013-08-30 ENCOUNTER — Non-Acute Institutional Stay (SKILLED_NURSING_FACILITY): Payer: Medicare Other | Admitting: Internal Medicine

## 2013-08-30 DIAGNOSIS — G8929 Other chronic pain: Secondary | ICD-10-CM

## 2013-08-30 DIAGNOSIS — F319 Bipolar disorder, unspecified: Secondary | ICD-10-CM

## 2013-08-30 NOTE — Assessment & Plan Note (Addendum)
Pt c/o that she is in pain constantly all over. Pt has Oxycodone 5mg  q6 prn and I checked with the nurse and she doesn't use it much-in fact today when she was in so much pain she didn't ask for it. I WILL MAKE IT 5MG  Q8 SCHEDULED.also pt did seem stiff and ST confirmed she was getting contractures. WILL START BACLOFEN 5 MG TID THEN INCREASE TO 10 MG TID Also pt is convinced she has RA. I will order an ESR and RA

## 2013-08-30 NOTE — Assessment & Plan Note (Signed)
Perceptually at least part of the problem with pain

## 2013-08-30 NOTE — Progress Notes (Signed)
MRN: [id number] Name: [name]  Sex: female Age: 79 y.o. DOB: [birth year]  PSC #: Ronni Rumble Facility/Room: 219A Level Of Care: SNF Provider: Merrilee Seashore D Emergency Contacts: Extended Emergency Contact Information Primary Emergency Contact: Simonin,[name] Address: [address]           [place], [place] [address] [place] Home Phone: [phone] Relation: Son Secondary Emergency Contact: Mondragon,Hope Address: [address] [place] [place] [address] [place] Home Phone: [phone] Relation: Daughter  Code Status: FULL  Allergies: Review of patient's allergies indicates no known allergies.  Chief Complaint  Patient presents with  . Acute Visit    HPI: Patient is 79 y.o. female who is complaining of pain anywhere on her body at the slightest touch  Past Medical History  Diagnosis Date  . COPD (chronic obstructive pulmonary disease)   . Hypertension   . Blind   . Hypothyroid   . Pacemaker   . Renal insufficiency   . Finger fracture   . Dementia   . Bipolar disorder   . Dysphagia     needs thick nectar liquids  . Atrial fibrillation   . Pneumonia   . Anxiety   . Diastolic heart failure     Past Surgical History  Procedure Laterality Date  . Insert / replace / remove pacemaker    . Tracheostomy  02/27/13    decannulated 03/29/13  . Gastrostomy tube placement  03/11/13      Medication List       This list is accurate as of: 08/30/13 12:59 PM.  Always use your most recent med list.               acetaminophen 325 MG tablet  Commonly known as:  TYLENOL  Take 650 mg by mouth every morning.     donepezil 10 MG tablet  Commonly known as:  ARICEPT  Take 10 mg by mouth at bedtime.     DULoxetine 60 MG capsule  Commonly known as:  CYMBALTA  Take 60 mg by mouth daily.     food thickener Powd  Commonly known as:  THICK IT  Take 1 Container by mouth as needed. Nectar thick liqiud     furosemide 20 MG tablet   Commonly known as:  LASIX  Take 20 mg by mouth 2 (two) times daily.     levothyroxine 150 MCG tablet  Commonly known as:  SYNTHROID, LEVOTHROID  Take 125 mcg by mouth daily before breakfast.     LORazepam 0.5 MG tablet  Commonly known as:  ATIVAN  Take 0.5 mg by mouth every 8 (eight) hours as needed for anxiety.     memantine 10 MG tablet  Commonly known as:  NAMENDA  Take 10 mg by mouth 2 (two) times daily.     Menthol-Zinc Oxide 0.44-20.625 % Oint  Apply 1 application topically 3 (three) times daily. For skin protector     oxyCODONE 5 MG immediate release tablet  Commonly known as:  Oxy IR/ROXICODONE  Take one tablet by mouth every 8 hours for pain     potassium chloride SA 20 MEQ tablet  Commonly known as:  K-DUR,KLOR-CON  Take 20 mEq by mouth daily.     PROTONIX 40 mg/20 mL Pack  Generic drug:  pantoprazole sodium  Place 40 mg into feeding tube daily.     Psyllium Husk Powd  Take 5 mLs by mouth 2 (two) times  daily.     QUEtiapine 25 MG tablet  Commonly known as:  SEROQUEL  Take 12.5 mg by mouth at bedtime.        No orders of the defined types were placed in this encounter.     There is no immunization history on file for this patient.  History  Substance Use Topics  . Smoking status: Former Smoker -- .5 years    Types: Cigarettes  . Smokeless tobacco: Not on file  . Alcohol Use: Not on file    Review of Systems  DATA OBTAINED: from patient, nurse; TO NURSE SHE DID NOT C/O PAIN OR ASK FOR PAIN MEDS BUT 15 MIN LATER SHE DID C/O PAIN TO ME IN ALL HER JOINTS. SHE THINKS SHE HAS RHEUMATOID ARTHRITIS BUT NO FH GENERAL:no fevers, fatigue, appetite changes SKIN: No itching, rash HEENT: No complaint RESPIRATORY: No cough, wheezing, SOB CARDIAC: No chest pain, palpitations, lower extremity edema  GI: No abdominal pain, No N/V/D or constipation, No heartburn or reflux  GU: No dysuria, frequency or urgency, or incontinence  MUSCULOSKELETAL: + bone/joint  pain NEUROLOGIC: No headache, dizziness or focal weakness PSYCHIATRIC: No overt anxiety or sadness. Sleeps well.   Filed Vitals:   08/30/13 1238  BP: 124/79  Pulse: 77  Temp: 98.4 F (36.9 C)  Resp: 17    Physical Exam  GENERAL APPEARANCE: Alert, conversant. Appropriately groomed. AGITATED, UNHAPPY SKIN: No diaphoresis rash, or wounds HEENT: Unremarkable RESPIRATORY: Breathing is even, unlabored. Lung sounds are clear   CARDIOVASCULAR: Heart RRR no murmurs, rubs or gallops. No peripheral edema  GASTROINTESTINAL: Abdomen is soft, non-tender, not distended w/ normal bowel sounds.  GENITOURINARY: Bladder non tender, not distended  MUSCULOSKELETAL: SHE DID SEEM TO BE HAVING MUSCLE SPASMS NEUROLOGIC: Cranial nerves 2-12 grossly intact. Moves all extremities no tremor. PSYCHIATRIC: AGITATED but no behavioral issues  Patient Active Problem List   Diagnosis Date Noted  . Chronic pharyngitis 05/11/2013  . Acute respiratory failure 03/04/2013  . Tracheostomy status 03/04/2013  . Acute and chronic respiratory failure 03/04/2013  . Acute respiratory failure 02/28/2013  . Tracheostomy in place 02/28/2013  . Aspiration pneumonia 02/28/2013  . Shoulder dislocation 02/20/2013  . Bipolar affective disorder 02/15/2013  . Tardive dyskinesia 02/15/2013  . Essential hypertension, benign 02/15/2013  . COPD (chronic obstructive pulmonary disease) 02/15/2013  . Unspecified constipation 02/15/2013  . UTI (urinary tract infection) 02/15/2013  . Weight loss 02/15/2013  . Dysphagia 02/15/2013  . Chronic pain 02/15/2013  . Dementia 08/16/2012  . Fall 08/16/2012  . Pacemaker 08/16/2012  . Hypothyroidism 08/16/2012    CBC    Component Value Date/Time   WBC 11.4* 04/01/2013 0515   RBC 3.06* 04/01/2013 0515   HGB 10.5* 04/01/2013 0515   HCT 31.2* 04/01/2013 0515   PLT 256 04/01/2013 0515   MCV 102.0* 04/01/2013 0515   LYMPHSABS 2.5 03/21/2013 1140   MONOABS 1.3* 03/21/2013 1140   EOSABS 0.2  03/21/2013 1140   BASOSABS 0.0 03/21/2013 1140   CBC 7/29  WBC  9.8  12.4/36.9  PLT 333  BMP  7/29  142,4.2,103, 31, 92, 27/0.6 Ca 9.0    Assessment and Plan  Chronic pain Pt c/o that she is in pain constantly all over. Pt has Oxycodone 5mg  q6 prn and I checked with the nurse and she doesn't use it much-in fact today when she was in so much pain she didn't ask for it. I WILL MAKE IT 5MG  Q8 SCHEDULED.also pt did seem stiff and ST confirmed  she was getting contractures. WILL START BACLOFEN 5 MG TID THEN INCREASE TO 10 MG TID Also pt is convinced she has RA. I will order an ESR and RA    Margit Hanks, MD

## 2013-09-10 ENCOUNTER — Non-Acute Institutional Stay (SKILLED_NURSING_FACILITY): Payer: Medicare Other | Admitting: Internal Medicine

## 2013-09-10 ENCOUNTER — Encounter: Payer: Self-pay | Admitting: Internal Medicine

## 2013-09-10 DIAGNOSIS — N390 Urinary tract infection, site not specified: Secondary | ICD-10-CM

## 2013-09-10 NOTE — Progress Notes (Signed)
MRN: [id number] Name: [name]  Sex: female Age: 79 y.o. DOB: 07-12-1928  PSC #: Ronni Rumble Facility/Room: Level Of Care: SNF Provider: Merrilee Seashore D Emergency Contacts: Extended Emergency Contact Information Primary Emergency Contact: Patella,[name] Address: [address]           [place], [place] [address] [place] Home Phone: [phone] Relation: Son Secondary Emergency Contact: Mondragon,Hope Address: [address] [place] [address] [place] Home Phone: [phone] Relation: Daughter  Code Status:   Allergies: Review of patient's allergies indicates no known allergies.  Chief Complaint  Patient presents with  . Acute Visit    HPI: Patient is 79 y.o. female who has urinary tract symptoms and been acting out lately.  Past Medical History  Diagnosis Date  . COPD (chronic obstructive pulmonary disease)   . Hypertension   . Blind   . Hypothyroid   . Pacemaker   . Renal insufficiency   . Finger fracture   . Dementia   . Bipolar disorder   . Dysphagia     needs thick nectar liquids  . Atrial fibrillation   . Pneumonia   . Anxiety   . Diastolic heart failure     Past Surgical History  Procedure Laterality Date  . Insert / replace / remove pacemaker    . Tracheostomy  02/27/13    decannulated 03/29/13  . Gastrostomy tube placement  03/11/13      Medication List       This list is accurate as of: 09/10/13  1:07 PM.  Always use your most recent med list.               acetaminophen 325 MG tablet  Commonly known as:  TYLENOL  Take 650 mg by mouth every morning.     donepezil 10 MG tablet  Commonly known as:  ARICEPT  Take 10 mg by mouth at bedtime.     DULoxetine 60 MG capsule  Commonly known as:  CYMBALTA  Take 60 mg by mouth daily.     food thickener Powd  Commonly known as:  THICK IT  Take 1 Container by mouth as needed. Nectar thick liqiud     furosemide 20 MG tablet  Commonly known as:   LASIX  Take 20 mg by mouth 2 (two) times daily.     levothyroxine 150 MCG tablet  Commonly known as:  SYNTHROID, LEVOTHROID  Take 125 mcg by mouth daily before breakfast.     LORazepam 0.5 MG tablet  Commonly known as:  ATIVAN  Take 0.5 mg by mouth every 8 (eight) hours as needed for anxiety.     memantine 10 MG tablet  Commonly known as:  NAMENDA  Take 10 mg by mouth 2 (two) times daily.     Menthol-Zinc Oxide 0.44-20.625 % Oint  Apply 1 application topically 3 (three) times daily. For skin protector     oxyCODONE 5 MG immediate release tablet  Commonly known as:  Oxy IR/ROXICODONE  Take one tablet by mouth every 8 hours for pain     potassium chloride SA 20 MEQ tablet  Commonly known as:  K-DUR,KLOR-CON  Take 20 mEq by mouth daily.     PROTONIX 40 mg/20 mL Pack  Generic drug:  pantoprazole sodium  Place 40 mg into feeding tube daily.     Psyllium Husk Powd  Take 5 mLs by mouth 2 (two) times daily.  QUEtiapine 25 MG tablet  Commonly known as:  SEROQUEL  Take 12.5 mg by mouth at bedtime.        No orders of the defined types were placed in this encounter.     There is no immunization history on file for this patient.  History  Substance Use Topics  . Smoking status: Former Smoker -- .5 years    Types: Cigarettes  . Smokeless tobacco: Not on file  . Alcohol Use: Not on file    Review of Systems  DATA OBTAINED: from patient, nurse GENERAL: Feels well no fevers, fatigue, appetite changes SKIN: No itching, rash HEENT: No complaint RESPIRATORY: No cough, wheezing, SOB CARDIAC: No chest pain, palpitations, lower extremity edema  GI: No abdominal pain, No N/V/D or constipation, No heartburn or reflux  GU: ADMITS HURTS TO PEE MUSCULOSKELETAL: No unrelieved bone/joint pain NEUROLOGIC: No headache, dizziness or focal weakness PSYCHIATRIC: SOME EPISODES OF ERRATIC BEHAVOIR    Filed Vitals:   09/10/13 1252  BP: 140/80  Pulse: 60  Temp: 98.4 F (36.9 C)   Resp: 18    Physical Exam  GENERAL APPEARANCE: Alert, MINIMALLY conversant. Appropriately groomed. No acute distress  SKIN: No diaphoresis rash, or wounds HEENT: Unremarkable RESPIRATORY: Breathing is even, unlabored. Lung sounds are clear   CARDIOVASCULAR: Heart RRR no murmurs, rubs or gallops. No peripheral edema  GASTROINTESTINAL: Abdomen is soft, non-tender, not distended w/ normal bowel sounds.  GENITOURINARY: Bladder non tender, not distended  MUSCULOSKELETAL: No abnormal joints or musculature NEUROLOGIC: Cranial nerves 2-12 grossly intact. Moves all extremities no tremor. PSYCHIATRIC: DEMENTIA, no behavioral issues  Patient Active Problem List   Diagnosis Date Noted  . Chronic pharyngitis 05/11/2013  . Acute respiratory failure 03/04/2013  . Tracheostomy status 03/04/2013  . Acute and chronic respiratory failure 03/04/2013  . Acute respiratory failure 02/28/2013  . Tracheostomy in place 02/28/2013  . Aspiration pneumonia 02/28/2013  . Shoulder dislocation 02/20/2013  . Bipolar affective disorder 02/15/2013  . Tardive dyskinesia 02/15/2013  . Essential hypertension, benign 02/15/2013  . COPD (chronic obstructive pulmonary disease) 02/15/2013  . Unspecified constipation 02/15/2013  . UTI (urinary tract infection) 02/15/2013  . Weight loss 02/15/2013  . Dysphagia 02/15/2013  . Chronic pain 02/15/2013  . Dementia 08/16/2012  . Fall 08/16/2012  . Pacemaker 08/16/2012  . Hypothyroidism 08/16/2012    CBC    Component Value Date/Time   WBC 11.4* 04/01/2013 0515   RBC 3.06* 04/01/2013 0515   HGB 10.5* 04/01/2013 0515   HCT 31.2* 04/01/2013 0515   PLT 256 04/01/2013 0515   MCV 102.0* 04/01/2013 0515   LYMPHSABS 2.5 03/21/2013 1140   MONOABS 1.3* 03/21/2013 1140   EOSABS 0.2 03/21/2013 1140   BASOSABS 0.0 03/21/2013 1140    CMP     Component Value Date/Time   NA 136 04/01/2013 0515   K 4.5 04/01/2013 0515   CL 100 04/01/2013 0515   CO2 26 04/01/2013 0515   GLUCOSE 124*  04/01/2013 0515   BUN 32* 04/01/2013 0515   CREATININE 0.67 04/01/2013 0515   CALCIUM 9.5 04/01/2013 0515   PROT 6.7 03/19/2013 0615   ALBUMIN 2.7* 03/19/2013 0615   AST 26 03/19/2013 0615   ALT 33 03/19/2013 0615   ALKPHOS 82 03/19/2013 0615   BILITOT 0.3 03/19/2013 0615   GFRNONAA 80* 04/01/2013 0515   GFRAA >90 04/01/2013 0515    Assessment and Plan  UTI (urinary tract infection) Pt with only > 35,000 colonies proteus mirabilis BUT she is symptomatic so  will treat with amoxil 875 BID for 7 days.    Margit Hanks, MD

## 2013-09-10 NOTE — Assessment & Plan Note (Addendum)
Pt with only > 35,000 colonies proteus mirabilis BUT she is symptomatic so will treat with amoxil 875 BID for 7 days.

## 2013-09-23 ENCOUNTER — Non-Acute Institutional Stay (SKILLED_NURSING_FACILITY): Payer: Medicare Other | Admitting: Nurse Practitioner

## 2013-09-23 DIAGNOSIS — J449 Chronic obstructive pulmonary disease, unspecified: Secondary | ICD-10-CM

## 2013-09-23 DIAGNOSIS — F319 Bipolar disorder, unspecified: Secondary | ICD-10-CM

## 2013-09-23 DIAGNOSIS — N390 Urinary tract infection, site not specified: Secondary | ICD-10-CM

## 2013-09-23 DIAGNOSIS — E039 Hypothyroidism, unspecified: Secondary | ICD-10-CM

## 2013-09-23 DIAGNOSIS — G8929 Other chronic pain: Secondary | ICD-10-CM

## 2013-09-23 NOTE — Progress Notes (Signed)
Patient ID: [name], female   DOB: 1928/06/16, 79 y.o.   MRN: [id number] Nursing Home Location:  [facility]   Place of Service: SNF (31)  PCP: [name], [name], DO   No Known Allergies  Chief Complaint  Patient presents with  . Acute Visit    HPI:  79 yo white female with h/o bipolar disorder, hypothyroidism, chronic pain, and COPD is being seen today at the request of staff; pt has been seen frequently in the last month due to pain, UTI, and cellulitis; completed 2 courses of antibiotics; staff reports pt is with increase agitation, refusing care and would like lab work drawn.  Pt denies fevers or chills, chest pain, shortness of breath, diarrhea, constipation, N/V, but has increase in pain when touched and has been diaphoretic (pt report that her sweating is "not that bad")  Review of Systems:  Review of Systems  Constitutional: Negative for malaise/fatigue.  HENT: Negative for congestion and sore throat.   Respiratory: Negative for cough, shortness of breath and stridor.   Cardiovascular: Negative for chest pain and palpitations.  Gastrointestinal: Negative for heartburn, nausea, vomiting, abdominal pain, diarrhea and constipation.  Genitourinary: Negative for dysuria.  Musculoskeletal: Positive for joint pain and myalgias.  Neurological: Positive for weakness. Negative for dizziness.  Psychiatric/Behavioral: Positive for depression and memory loss.     Past Medical History  Diagnosis Date  . COPD (chronic obstructive pulmonary disease)   . Hypertension   . Blind   . Hypothyroid   . Pacemaker   . Renal insufficiency   . Finger fracture   . Dementia   . Bipolar disorder   . Dysphagia     needs thick nectar liquids  . Atrial fibrillation   . Pneumonia   . Anxiety   . Diastolic heart failure    Past Surgical History  Procedure Laterality Date  . Insert / replace / remove pacemaker    . Tracheostomy  02/27/13    decannulated 03/29/13  .  Gastrostomy tube placement  03/11/13   Social History:   reports that she has quit smoking. Her smoking use included Cigarettes. She smoked 0.00 packs per day for .5 years. She does not have any smokeless tobacco history on file. She reports that she does not use illicit drugs. Her alcohol history is not on file.  No family history on file.  Medications: Patient's Medications  New Prescriptions   No medications on file  Previous Medications   ACETAMINOPHEN (TYLENOL) 325 MG TABLET    Take 650 mg by mouth every morning.   DONEPEZIL (ARICEPT) 10 MG TABLET    Take 10 mg by mouth at bedtime.   DULOXETINE (CYMBALTA) 60 MG CAPSULE    Take 60 mg by mouth daily.   FOOD THICKENER (THICK IT) POWD    Take 1 Container by mouth as needed. Nectar thick liqiud   FUROSEMIDE (LASIX) 20 MG TABLET    Take 20 mg by mouth 2 (two) times daily.   LEVOTHYROXINE (SYNTHROID, LEVOTHROID) 150 MCG TABLET    Take 125 mcg by mouth daily before breakfast.    LORAZEPAM (ATIVAN) 0.5 MG TABLET    Take 0.5 mg by mouth every 8 (eight) hours as needed for anxiety.   MEMANTINE (NAMENDA) 10 MG TABLET    Take 10 mg by mouth 2 (two) times daily.   MENTHOL-ZINC OXIDE 0.44-20.625 % OINT    Apply 1 application topically 3 (three) times daily. For skin protector   OXYCODONE (OXY IR/ROXICODONE)  5 MG IMMEDIATE RELEASE TABLET    Take one tablet by mouth every 8 hours for pain   PANTOPRAZOLE SODIUM (PROTONIX) 40 MG/20 ML PACK    Place 40 mg into feeding tube daily.   POTASSIUM CHLORIDE SA (K-DUR,KLOR-CON) 20 MEQ TABLET    Take 20 mEq by mouth daily.   PSYLLIUM HUSK POWD    Take 5 mLs by mouth 2 (two) times daily.   QUETIAPINE (SEROQUEL) 25 MG TABLET    Take 12.5 mg by mouth at bedtime.  Modified Medications   No medications on file  Discontinued Medications   No medications on file     Physical Exam:  Filed Vitals:   09/23/13 1548  BP: 139/83  Pulse: 82  Temp: 97.8 F (36.6 C)  Resp: 18   Physical Exam  Constitutional: She  is well-developed, well-nourished, and in no distress. No distress.  HENT:  Head: Atraumatic.  Eyes: Conjunctivae and EOM are normal. Pupils are equal, round, and reactive to light.  Neck: Neck supple. No thyromegaly present.  Cardiovascular: Normal rate, regular rhythm and normal heart sounds.   Pulmonary/Chest: Effort normal and breath sounds normal. No respiratory distress.  Abdominal: Soft. Bowel sounds are normal. She exhibits no distension.  Musculoskeletal: She exhibits tenderness (to arms and hands when touched). She exhibits no edema.  Neurological: She is alert.  Skin: Skin is warm. No rash noted. She is diaphoretic. No erythema.       Labs reviewed: Basic Metabolic Panel:  Recent Labs  16/10/96 2015  03/01/13 0528  03/25/13 0500 03/27/13 0711 04/01/13 0515  NA 137  < > 140  < > 138 135 136  K 4.6  < > 3.6  < > 3.3* 4.0 4.5  CL 104  < > 106  < > 105 104 100  CO2 21  < > 27  < > 23 19 26   GLUCOSE 261*  < > 115*  < > 113* 122* 124*  BUN 36*  < > 33*  < > 40* 35* 32*  CREATININE 1.26*  < > 0.70  < > 0.67 0.73 0.67  CALCIUM 8.8  < > 8.5  < > 8.5 8.8 9.5  MG 2.4  --  2.1  --   --   --   --   < > = values in this interval not displayed. Liver Function Tests:  Recent Labs  03/01/13 0528 03/07/13 0700 03/19/13 0615  AST 13 18 26   ALT 13 16 33  ALKPHOS 56 63 82  BILITOT 0.3 0.3 0.3  PROT 5.2* 6.5 6.7  ALBUMIN 2.3* 2.6* 2.7*    Recent Labs  02/24/13 2015  LIPASE 51   No results found for this basename: AMMONIA,  in the last 8760 hours CBC:  Recent Labs  03/07/13 0700  03/21/13 0538 03/21/13 1140  03/27/13 0711 03/30/13 1532 04/01/13 0515  WBC 11.5*  < > 8.2 11.2*  < > 11.9* 11.5* 11.4*  NEUTROABS 9.2*  --  5.7 7.3  --   --   --   --   HGB 11.9*  < > 9.3* 12.3  < > 10.3* 11.0* 10.5*  HCT 34.4*  < > 27.6* 36.5  < > 30.9* 32.0* 31.2*  MCV 101.8*  < > 102.6* 102.5*  < > 102.0* 101.3* 102.0*  PLT 212  < > 228 324  < > 197 176 256  < > = values in  this interval not displayed. Cardiac Enzymes:  Recent Labs  02/25/13 0322  TROPONINI <0.30   BNP: No components found with this basename: POCBNP,  CBG:  Recent Labs  02/28/13 0811 02/28/13 1244 02/28/13 1542  GLUCAP 93 95 83   TSH:  Recent Labs  02/25/13 0322 03/06/13 0500  TSH 0.151* 0.369   A1C: Lab Results  Component Value Date   HGBA1C 5.7* 05/24/2011   Lipid Panel:  Recent Labs  02/27/13 0340  TRIG 64    Assessment/Plan 1. Hypothyroidism Will get tsh  2. UTI (urinary tract infection) Resolved; no symptoms; has finished antibiotics   3. Chronic pain Ongoing worsening of pain; has oxycodone 5 mg PRN  4. Bipolar affective disorder With some increase in agitation; will not change medications at this time but get blood work  5. COPD (chronic obstructive pulmonary disease) Patients pulmonary status is stable; continue current regimen.   Labs/tests ordered Will get CBC, CMP, TSH, vit d level

## 2013-10-03 ENCOUNTER — Other Ambulatory Visit: Payer: Self-pay

## 2013-10-03 MED ORDER — LORAZEPAM 0.5 MG PO TABS: ORAL_TABLET | ORAL | Status: DC

## 2013-10-04 ENCOUNTER — Non-Acute Institutional Stay (SKILLED_NURSING_FACILITY): Payer: Medicare Other | Admitting: Internal Medicine

## 2013-10-04 ENCOUNTER — Encounter: Payer: Self-pay | Admitting: Internal Medicine

## 2013-10-04 DIAGNOSIS — F0390 Unspecified dementia without behavioral disturbance: Secondary | ICD-10-CM

## 2013-10-04 DIAGNOSIS — I10 Essential (primary) hypertension: Secondary | ICD-10-CM

## 2013-10-04 DIAGNOSIS — G8929 Other chronic pain: Secondary | ICD-10-CM

## 2013-10-04 DIAGNOSIS — Z930 Tracheostomy status: Secondary | ICD-10-CM

## 2013-10-04 DIAGNOSIS — F319 Bipolar disorder, unspecified: Secondary | ICD-10-CM

## 2013-10-04 DIAGNOSIS — F068 Other specified mental disorders due to known physiological condition: Secondary | ICD-10-CM

## 2013-10-04 DIAGNOSIS — Z7189 Other specified counseling: Secondary | ICD-10-CM

## 2013-10-04 DIAGNOSIS — Z710 Person encountering health services to consult on behalf of another person: Secondary | ICD-10-CM

## 2013-10-04 DIAGNOSIS — J449 Chronic obstructive pulmonary disease, unspecified: Secondary | ICD-10-CM

## 2013-10-04 NOTE — Assessment & Plan Note (Signed)
Still think part of perceptual problem and her nurse thinks that anxiety plays a big role.

## 2013-10-04 NOTE — Progress Notes (Signed)
MRN: [id number] Name: [name]  Sex: female Age: 79 y.o. DOB: 07-Apr-1928  PSC #: Ronni Rumble Facility/Room: Level Of Care: SNF Provider: Merrilee Seashore D Emergency Contacts: Extended Emergency Contact Information Primary Emergency Contact: Nation,[name] Address: [address]           [place], [place] [address] [place] Home Phone: [phone] Relation: Son Secondary Emergency Contact: Mondragon,Hope Address: [address] [place] [place] [address] [place] Home Phone: [phone] Relation: Daughter  Code Status:   Allergies: Review of patient's allergies indicates no known allergies.  Chief Complaint  Patient presents with  . Medical Managment of Chronic Issues    HPI: Patient is 79 y.o. female whose major recent problem has been muscle pain and stiffness. Pt's son is interested in alternate modalities if possible.   Past Medical History  Diagnosis Date  . COPD (chronic obstructive pulmonary disease)   . Hypertension   . Blind   . Hypothyroid   . Pacemaker   . Renal insufficiency   . Finger fracture   . Dementia   . Bipolar disorder   . Dysphagia     needs thick nectar liquids  . Atrial fibrillation   . Pneumonia   . Anxiety   . Diastolic heart failure     Past Surgical History  Procedure Laterality Date  . Insert / replace / remove pacemaker    . Tracheostomy  02/27/13    decannulated 03/29/13  . Gastrostomy tube placement  03/11/13      Medication List       This list is accurate as of: 10/04/13  8:36 PM.  Always use your most recent med list.               acetaminophen 325 MG tablet  Commonly known as:  TYLENOL  Take 650 mg by mouth every morning.     baclofen 10 MG tablet  Commonly known as:  LIORESAL  Take 10 mg by mouth 3 (three) times daily.     donepezil 10 MG tablet  Commonly known as:  ARICEPT  Take 10 mg by mouth at bedtime.     DULoxetine 60 MG capsule  Commonly known as:  CYMBALTA   Take 60 mg by mouth daily.     food thickener Powd  Commonly known as:  THICK IT  Take 1 Container by mouth as needed. Nectar thick liqiud     furosemide 20 MG tablet  Commonly known as:  LASIX  Take 20 mg by mouth 2 (two) times daily.     levothyroxine 150 MCG tablet  Commonly known as:  SYNTHROID, LEVOTHROID  Take 125 mcg by mouth daily before breakfast.     LORazepam 0.5 MG tablet  Commonly known as:  ATIVAN  1/2 by mouth every 8 hours as needed for anxiety     memantine 10 MG tablet  Commonly known as:  NAMENDA  Take 10 mg by mouth 2 (two) times daily.     Menthol-Zinc Oxide 0.44-20.625 % Oint  Apply 1 application topically 3 (three) times daily. For skin protector     oxyCODONE 5 MG immediate release tablet  Commonly known as:  Oxy IR/ROXICODONE  Take one tablet by mouth every 8 hours for pain     potassium chloride SA 20 MEQ tablet  Commonly known as:  K-DUR,KLOR-CON  Take 20 mEq by mouth daily.     PROTONIX 40 mg/20 mL  Pack  Generic drug:  pantoprazole sodium  Place 40 mg into feeding tube daily.     Psyllium Husk Powd  Take 5 mLs by mouth 2 (two) times daily.     QUEtiapine 25 MG tablet  Commonly known as:  SEROQUEL  Take 12.5 mg by mouth at bedtime.        Meds ordered this encounter  Medications  . baclofen (LIORESAL) 10 MG tablet    Sig: Take 10 mg by mouth 3 (three) times daily.     There is no immunization history on file for this patient.  History  Substance Use Topics  . Smoking status: Former Smoker -- .5 years    Types: Cigarettes  . Smokeless tobacco: Not on file  . Alcohol Use: Not on file    Review of Systems  DATA OBTAINED: from patient, nurse PT CAN ONLY FIXATE ON MUSCLE PAIN AND SPASM. TODAY SHE DID NOT SHOW CONCERN ABOUT HER LACK OF GRIP. R ARM IS AREA OF WORST PAIN  Filed Vitals:   10/04/13 1246  BP: 108/72  Pulse: 69  Temp: 97.5 F (36.4 C)  Resp: 20    Physical Exam  GENERAL APPEARANCE: Alert, conversant.  Appropriately groomed. + distress  SKIN: No diaphoresis rash, or wounds HEENT: Unremarkable RESPIRATORY: Breathing is even, unlabored. Lung sounds are clear   CARDIOVASCULAR: Heart RRR no murmurs, rubs or gallops. No peripheral edema  GASTROINTESTINAL: Abdomen is soft, non-tender, not distended w/ normal bowel sounds.  GENITOURINARY: Bladder non tender, not distended  MUSCULOSKELETAL: PT'S RIGHT ARM IS STIFF WITH LIMITED ROM WITHOUT MUCH PAIN.PT'S NECK SEEMS CONTRACTED TO THE RIGHT AND APPEARS TO BE CAUSING DISCOMFORT NEUROLOGIC: Cranial nerves 2-12 grossly intact.  PSYCHIATRIC ODD AFFECT,MILD-MOD DEMENTIA no behavioral issues  Patient Active Problem List   Diagnosis Date Noted  . Chronic pharyngitis 05/11/2013  . Acute respiratory failure 03/04/2013  . Tracheostomy status 03/04/2013  . Acute and chronic respiratory failure 03/04/2013  . Acute respiratory failure 02/28/2013  . Tracheostomy in place 02/28/2013  . Aspiration pneumonia 02/28/2013  . Shoulder dislocation 02/20/2013  . Bipolar affective disorder 02/15/2013  . Tardive dyskinesia 02/15/2013  . Essential hypertension, benign 02/15/2013  . COPD (chronic obstructive pulmonary disease) 02/15/2013  . Unspecified constipation 02/15/2013  . UTI (urinary tract infection) 02/15/2013  . Weight loss 02/15/2013  . Dysphagia 02/15/2013  . Chronic pain 02/15/2013  . Dementia 08/16/2012  . Fall 08/16/2012  . Pacemaker 08/16/2012  . Hypothyroidism 08/16/2012    CBC    Component Value Date/Time   WBC 11.4* 04/01/2013 0515   RBC 3.06* 04/01/2013 0515   HGB 10.5* 04/01/2013 0515   HCT 31.2* 04/01/2013 0515   PLT 256 04/01/2013 0515   MCV 102.0* 04/01/2013 0515   LYMPHSABS 2.5 03/21/2013 1140   MONOABS 1.3* 03/21/2013 1140   EOSABS 0.2 03/21/2013 1140   BASOSABS 0.0 03/21/2013 1140    CMP     Component Value Date/Time   NA 136 04/01/2013 0515   K 4.5 04/01/2013 0515   CL 100 04/01/2013 0515   CO2 26 04/01/2013 0515   GLUCOSE 124*  04/01/2013 0515   BUN 32* 04/01/2013 0515   CREATININE 0.67 04/01/2013 0515   CALCIUM 9.5 04/01/2013 0515   PROT 6.7 03/19/2013 0615   ALBUMIN 2.7* 03/19/2013 0615   AST 26 03/19/2013 0615   ALT 33 03/19/2013 0615   ALKPHOS 82 03/19/2013 0615   BILITOT 0.3 03/19/2013 0615   GFRNONAA 80* 04/01/2013 0515   GFRAA >90 04/01/2013  1610    Assessment and Plan  Chronic pain Pt's muscle spasms/contractures seem to be not improving.Her regular nurse commented that when she came back from her last hospitalization her physical/muscle state had worsened considerably. Her son, when I spoke to him in person several days ago commented that she can no longer hold things with her hands because her fingers are so stiff. Her mental status though is much better and he is grateful for that.  I have reviewed all pt records.She had extrapyramydal syndrome and was on cogentin but that was D/C by psych. Pt's baclofen can be increased and will be. I called son per phone today and discussed this with him.also will start valium qHs for its muscle relaxing properties and dec ativan to 0.25 BID prn.[name], pt's son, is interested in other modalities.Mom can't use [place] because of her pacer but we discussed acupuncture and dry needling and will check into ins coverage which I know will lead to acupuncture which is also more available. Have left a note with Child psychotherapist.  Bipolar affective disorder Still think part of perceptual problem and her nurse thinks that anxiety plays a big role.  Essential hypertension, benign Is well controlled on lasix alone  COPD (chronic obstructive pulmonary disease) Has not been a problem recently  Dementia Will continue namenda.She is just demented enough to not be able to use cognition to deal with pain.    Margit Hanks, MD

## 2013-10-04 NOTE — Assessment & Plan Note (Signed)
Pt's muscle spasms/contractures seem to be not improving.Her regular nurse commented that when she came back from her last hospitalization her physical/muscle state had worsened considerably. Her son, when I spoke to him in person several days ago commented that she can no longer hold things with her hands because her fingers are so stiff. Her mental status though is much better and he is grateful for that.  I have reviewed all pt records.She had extrapyramydal syndrome and was on cogentin but that was D/C by psych. Pt's baclofen can be increased and will be. I called son per phone today and discussed this with him.also will start valium qHs for its muscle relaxing properties and dec ativan to 0.25 BID prn.[name], pt's son, is interested in other modalities.Mom can't use [place] because of her pacer but we discussed acupuncture and dry needling and will check into ins coverage which I know will lead to acupuncture which is also more available. Have left a note with Child psychotherapist.

## 2013-10-04 NOTE — Assessment & Plan Note (Signed)
Will continue namenda.She is just demented enough to not be able to use cognition to deal with pain.

## 2013-10-04 NOTE — Assessment & Plan Note (Signed)
Is well controlled on lasix alone

## 2013-10-04 NOTE — Assessment & Plan Note (Signed)
Has not been a problem recently

## 2013-10-07 NOTE — Progress Notes (Signed)
Patient ID: [name], female   DOB: 1928/05/27, 79 y.o.   MRN: [id number]     [facility]  No Known Allergies  Chief Complaint  Patient presents with  . Medical Managment of Chronic Issues    HPI  She is being seen for the management of her chronic illnesses. She cannot fully participate in the hpi or ros. The nursing staff reports that her right earlobe is red and inflamed with purulent drainage present.    Past Medical History  Diagnosis Date  . COPD (chronic obstructive pulmonary disease)   . Hypertension   . Blind   . Hypothyroid   . Pacemaker   . Renal insufficiency   . Finger fracture   . Dementia   . Bipolar disorder   . Dysphagia     needs thick nectar liquids  . Atrial fibrillation   . Pneumonia   . Anxiety   . Diastolic heart failure     Past Surgical History  Procedure Laterality Date  . Insert / replace / remove pacemaker    . Tracheostomy  02/27/13    decannulated 03/29/13  . Gastrostomy tube placement  03/11/13    Filed Vitals:   05/21/13 1346  BP: 110/74  Pulse: 68  Height: 5\' 6"  (1.676 m)  Weight: 129 lb (58.514 kg)    MEDICATIONS  aricept 10 mg daily Honey thick liquids humalog 3 units for cbg >200 K+ 20 meq dialy Lasix 20 mg twice daily Ativan 0.25 mg every 8 hours as needed namenda 10 mg twice daily Oxycodone 5 mg every 6 hours as needed protonix 40 mg daily Metamucil 1 tsp twice daily seroquel 25 mg nightly effexor 37.5 mg twice daily Jevity 45 cc/hr Synthroid 150 mcg daily  LABS REVIEWED:  04-09-13: wbc 10.5; hgb 10.8; hct 32.5 ;mcv 100.3; plt 538; glucose 109; bun 31; creat 0.57; k+4.2; na++137  Review of Systems  HENT: Positive for sore throat.   Respiratory: Negative for cough.   Cardiovascular: Negative for chest pain.  Gastrointestinal: Negative for heartburn and constipation.  Musculoskeletal: Negative for joint pain.  Skin: Negative.   Neurological: Negative for headaches.  Psychiatric/Behavioral: Negative for  depression. The patient is not nervous/anxious.     Physical Exam  Constitutional: No distress.  Frail   Neck: Neck supple. No JVD present.  Cardiovascular: Normal rate and regular rhythm.   Respiratory: Effort normal and breath sounds normal. No respiratory distress. She has no wheezes.  GI: Soft. Bowel sounds are normal. She exhibits no distension. There is no tenderness.  Peg tube  Musculoskeletal: She exhibits edema.  Is able to move all extremities; has generalized weakness present. Has mild generalized edema present   Neurological: She is alert.  Skin: Skin is warm and dry. She is not diaphoretic.      ASSESSMENT/PLAN  1. Dysphagia: is on tube feeding at 45 cc per hour to provide nutritional supplementation; requires honey thick liquids. There are no signs of aspiration present. Will continue the current plan of care and will continue to monitor her status   2. Genella Rife: is presently stable will continue protonix 40 mg daily and will monitor   3. Hypothyroidism: will continue synthroid 150 mcg daily   4. Dementia: without change in status will continue namenda 10 mg twice daily and aricept 10 mg daily and will monitor   5. Edema: is stable will continue lasix 20 mg twice daily with k+ 20 meq daily and will monitor   6. Bipolar affective disorder: is  stable will aconitine her seroquel 25 mg nightly and ativan 0.25 mg every 8 hours as needed for anxiety.   7. Diabetes: will continue humalog 3 units prior to meals for cbg >200  8. Constipation: will continue metamucil 1 tsp twice daily   9. For right ear lobe cellulitis: will being keflex 500 mg three times daily for 2 weeks with florastor twice daily for 2 weeks and will monitor

## 2013-10-08 NOTE — Progress Notes (Signed)
Patient ID: [name], female   DOB: [birth year], 79 y.o.   MRN: [id number]     [facility]  No Known Allergies  Chief Complaint  Patient presents with  . Acute Visit    lethargy with poor intake     HPI  She is being seen today for her increased lethargy. Nursing reports that she is sleeping more and her po intake is significantly decreased. She is less interactive with her surroundings and is having difficulty staying awake. The staff is wondering if some her medications could be stopped or reduced.   Past Medical History  Diagnosis Date  . COPD (chronic obstructive pulmonary disease)   . Hypertension   . Blind   . Hypothyroid   . Pacemaker   . Renal insufficiency   . Finger fracture   . Dementia   . Bipolar disorder   . Dysphagia     needs thick nectar liquids  . Atrial fibrillation   . Pneumonia   . Anxiety   . Diastolic heart failure     Past Surgical History  Procedure Laterality Date  . Insert / replace / remove pacemaker    . Tracheostomy  02/27/13    decannulated 03/29/13  . Gastrostomy tube placement  03/11/13    Filed Vitals:   05/28/13 1104  BP: 101/76  Pulse: 78  Height: 5\' 6"  (1.676 m)  Weight: 129 lb (58.514 kg)    MEDICATIONS  aricept 10 mg daily Honey thick liquids humalog 3 units for cbg >200 K+ 20 meq dialy Lasix 20 mg twice daily Ativan 0.25 mg every 8 hours as needed namenda 10 mg twice daily Oxycodone 5 mg every 6 hours as needed protonix 40 mg daily Metamucil 1 tsp twice daily seroquel 25 mg nightly effexor 37.5 mg twice daily Jevity 45 cc/hr Synthroid 150 mcg daily remeron 7.5 mg nightly     LABS REVIEWED:  04-09-13: wbc 10.5; hgb 10.8; hct 32.5 ;mcv 100.3; plt 538; glucose 109; bun 31; creat 0.57; k+4.2; na++137 05-15-13: tsh 0.362     Review of Systems  Unable to perform ROS    Physical Exam  Constitutional: No distress.  Frail   Neck: Neck supple. No JVD present.  Cardiovascular: Normal rate and regular  rhythm.   Respiratory: Effort normal and breath sounds normal. No respiratory distress. She has no wheezes.  GI: Soft. Bowel sounds are normal. She exhibits no distension. There is no tenderness.  Peg tube  Musculoskeletal: She exhibits edema.  Is able to move all extremities; has generalized weakness present. Has mild generalized edema present   Neurological: She is alert.  Skin: Skin is warm and dry. She is not diaphoretic.      ASSESSMENT/PLAN  1. Dysphagia: is on tube feeding at 45 cc per hour to provide nutritional supplementation; requires honey thick liquids. There are no signs of aspiration present. Will continue the current plan of care and will continue to monitor her status   2. Genella Rife: is presently stable will continue protonix 40 mg daily and will monitor   3. Hypothyroidism: will continue synthroid 150 mcg daily   4. Dementia: without change in status will continue namenda 10 mg twice daily and aricept 10 mg daily and will monitor   5. Edema: is stable will continue lasix 20 mg twice daily with k+ 20 meq daily and will monitor   6. Bipolar affective disorder: will lower her seroquel to 12.5 mg nightly due to her lethargy and will monitor   and  ativan 0.25 mg every 8 hours as needed for anxiety.  Will stop the remeron at this time.   7. Diabetes: will continue humalog 3 units prior to meals for cbg >200  8. Constipation: will continue metamucil 1 tsp twice daily

## 2013-10-28 ENCOUNTER — Other Ambulatory Visit: Payer: Self-pay | Admitting: *Deleted

## 2013-10-28 MED ORDER — OXYCODONE HCL 5 MG PO TABS: ORAL_TABLET | ORAL | Status: DC

## 2013-10-29 ENCOUNTER — Encounter: Payer: Self-pay | Admitting: Internal Medicine

## 2013-10-29 ENCOUNTER — Non-Acute Institutional Stay (SKILLED_NURSING_FACILITY): Payer: Medicare Other | Admitting: Internal Medicine

## 2013-10-29 DIAGNOSIS — J988 Other specified respiratory disorders: Secondary | ICD-10-CM

## 2013-10-29 DIAGNOSIS — G243 Spasmodic torticollis: Secondary | ICD-10-CM

## 2013-10-29 DIAGNOSIS — J4489 Other specified chronic obstructive pulmonary disease: Secondary | ICD-10-CM

## 2013-10-29 DIAGNOSIS — J449 Chronic obstructive pulmonary disease, unspecified: Secondary | ICD-10-CM

## 2013-10-29 NOTE — Progress Notes (Signed)
Patient ID: [name], female   DOB: [birth year], 79 y.o.   MRN: [id number]  Location:  [facility]  Provider:  Gwenith Spitz. Renato Gails, D.O., C.M.D.  Code Status: full code  Chief Complaint  Patient presents with  . Acute Visit    redness of eyes, cough and congestion in chest    HPI:  79 yo white female who is chronically ill with COPD, bipolar disorder, torticollis seen today for acute visit due to increased cough and congestion and redness of her eyes.  When seen, she admitted to feeling poorly, has chronic myalgias, has slight increase in her temperature, is coughing up sputum.  She is not nauseated, but ate poorly.    Review of Systems:  Review of Systems  Constitutional: Positive for fever and malaise/fatigue. Negative for chills.  HENT: Positive for congestion and sore throat.   Respiratory: Positive for cough and sputum production. Negative for shortness of breath.   Cardiovascular: Negative for chest pain.  Gastrointestinal: Negative for nausea, abdominal pain, diarrhea and constipation.  Musculoskeletal: Positive for myalgias.  Skin: Negative for rash.  Neurological: Positive for weakness.  Psychiatric/Behavioral: Positive for depression and memory loss. The patient is nervous/anxious.     Medications: Patient's Medications  New Prescriptions   No medications on file  Previous Medications   ACETAMINOPHEN (TYLENOL) 325 MG TABLET    Take 650 mg by mouth every morning.   BACLOFEN (LIORESAL) 10 MG TABLET    Take 10 mg by mouth 3 (three) times daily.   DONEPEZIL (ARICEPT) 10 MG TABLET    Take 10 mg by mouth at bedtime.   DULOXETINE (CYMBALTA) 60 MG CAPSULE    Take 60 mg by mouth daily.   FOOD THICKENER (THICK IT) POWD    Take 1 Container by mouth as needed. Nectar thick liqiud   FUROSEMIDE (LASIX) 20 MG TABLET    Take 20 mg by mouth 2 (two) times daily.   LEVOTHYROXINE (SYNTHROID, LEVOTHROID) 150 MCG TABLET    Take 125 mcg by mouth daily before breakfast.    LORAZEPAM (ATIVAN) 0.5 MG TABLET    1/2 by mouth every 8 hours as needed for anxiety   MEMANTINE (NAMENDA) 10 MG TABLET    Take 10 mg by mouth 2 (two) times daily.   MENTHOL-ZINC OXIDE 0.44-20.625 % OINT    Apply 1 application topically 3 (three) times daily. For skin protector   OXYCODONE (OXY IR/ROXICODONE) 5 MG IMMEDIATE RELEASE TABLET    Take one tablet by mouth every 8 hours for pain   PANTOPRAZOLE SODIUM (PROTONIX) 40 MG/20 ML PACK    Place 40 mg into feeding tube daily.   POTASSIUM CHLORIDE SA (K-DUR,KLOR-CON) 20 MEQ TABLET    Take 20 mEq by mouth daily.   PSYLLIUM HUSK POWD    Take 5 mLs by mouth 2 (two) times daily.   QUETIAPINE (SEROQUEL) 25 MG TABLET    Take 12.5 mg by mouth at bedtime.  Modified Medications   No medications on file  Discontinued Medications   No medications on file    Physical Exam: Filed Vitals:   10/29/13 1843  BP: 119/82  Pulse: 83  Temp: 99.1 F (37.3 C)  Resp: 18   Physical Exam  Constitutional:  Frail white female with torticollis, neck turned to right, redness of cheeks  HENT:  Head: Normocephalic.  Eyes: EOM are normal. Pupils are equal, round, and reactive to light.  Conjunctival erythema  Cardiovascular: Normal rate, regular rhythm, normal heart sounds  and intact distal pulses.   Pulmonary/Chest: Effort normal and breath sounds normal.  Few upper airway congestive sounds, but lungs CTA  Abdominal: Soft. Bowel sounds are normal. She exhibits no distension. There is no tenderness.  Lymphadenopathy:    She has no cervical adenopathy.  Neurological: She is alert.  Skin: Skin is warm and dry. There is erythema.     Labs reviewed: Basic Metabolic Panel:  Recent Labs  16/10/96 2015  03/01/13 0528  03/25/13 0500 03/27/13 0711 04/01/13 0515  NA 137  < > 140  < > 138 135 136  K 4.6  < > 3.6  < > 3.3* 4.0 4.5  CL 104  < > 106  < > 105 104 100  CO2 21  < > 27  < > 23 19 26   GLUCOSE 261*  < > 115*  < > 113* 122* 124*  BUN 36*  < > 33*   < > 40* 35* 32*  CREATININE 1.26*  < > 0.70  < > 0.67 0.73 0.67  CALCIUM 8.8  < > 8.5  < > 8.5 8.8 9.5  MG 2.4  --  2.1  --   --   --   --   < > = values in this interval not displayed.  Liver Function Tests:  Recent Labs  03/01/13 0528 03/07/13 0700 03/19/13 0615  AST 13 18 26   ALT 13 16 33  ALKPHOS 56 63 82  BILITOT 0.3 0.3 0.3  PROT 5.2* 6.5 6.7  ALBUMIN 2.3* 2.6* 2.7*    CBC:  Recent Labs  03/07/13 0700  03/21/13 0538 03/21/13 1140  03/27/13 0711 03/30/13 1532 04/01/13 0515  WBC 11.5*  < > 8.2 11.2*  < > 11.9* 11.5* 11.4*  NEUTROABS 9.2*  --  5.7 7.3  --   --   --   --   HGB 11.9*  < > 9.3* 12.3  < > 10.3* 11.0* 10.5*  HCT 34.4*  < > 27.6* 36.5  < > 30.9* 32.0* 31.2*  MCV 101.8*  < > 102.6* 102.5*  < > 102.0* 101.3* 102.0*  PLT 212  < > 228 324  < > 197 176 256  < > = values in this interval not displayed. Assessment/Plan 1. Congestion of upper airway Pcxr if lung congestion develops Influenza a and b and h1n1 swab Cbc, bmp stat Monitor po intake Push po fluids--if not taking po, call md/np for fluid orders Vs q shift with pox while awake for 72 hrs Droplet precautions and isolation x 5 days  2. COPD (chronic obstructive pulmonary disease) -some increased upper airway congestion today with mildly elevated temp, but has no other concerning flu like symptoms or evidence of copd exacerbation  3. Torticollis, spasmodic -her son has plans to take her for needling to help relieve her pain -may benefit from therapy working with her neck if she would permit, but she was refusing help    Family/ staff Communication: discussed with her son that we are ruling out flu in patients with respiratory symptoms suspicious for this

## 2013-11-02 ENCOUNTER — Encounter: Payer: Self-pay | Admitting: Internal Medicine

## 2013-11-12 ENCOUNTER — Non-Acute Institutional Stay (SKILLED_NURSING_FACILITY): Payer: Medicare Other | Admitting: Internal Medicine

## 2013-11-12 ENCOUNTER — Encounter: Payer: Self-pay | Admitting: Internal Medicine

## 2013-11-12 DIAGNOSIS — Z20828 Contact with and (suspected) exposure to other viral communicable diseases: Secondary | ICD-10-CM

## 2013-11-12 DIAGNOSIS — M129 Arthropathy, unspecified: Secondary | ICD-10-CM

## 2013-11-12 DIAGNOSIS — M1990 Unspecified osteoarthritis, unspecified site: Secondary | ICD-10-CM

## 2013-11-12 NOTE — Progress Notes (Signed)
MRN: [id number] Name: Renatha Rosen  Sex: female Age: 79 y.o. DOB: 1928/09/16  PSC #: Ronni Rumble Facility/Room: 219A Level Of Care: SNF Provider: Merrilee Seashore D Emergency Contacts: Extended Emergency Contact Information Primary Emergency Contact: Dardis,[name] Address: [address]           [place], [place] [address] [place] Home Phone: [phone] Relation: Son Secondary Emergency Contact: Mondragon,Hope Address: [address] [place] [place] [address] [place] Home Phone: [phone] Relation: Daughter  Code Status: fULL  Allergies: Review of patient's allergies indicates no known allergies.  Chief Complaint  Patient presents with  . Acute Visit    HPI: Patient is 79 y.o. female who is having pain and swelling R hand which occurs from time to time  Past Medical History  Diagnosis Date  . COPD (chronic obstructive pulmonary disease)   . Hypertension   . Blind   . Hypothyroid   . Pacemaker   . Renal insufficiency   . Finger fracture   . Dementia   . Bipolar disorder   . Dysphagia     needs thick nectar liquids  . Atrial fibrillation   . Pneumonia   . Anxiety   . Diastolic heart failure     Past Surgical History  Procedure Laterality Date  . Insert / replace / remove pacemaker    . Tracheostomy  02/27/13    decannulated 03/29/13  . Gastrostomy tube placement  03/11/13      Medication List       This list is accurate as of: 11/12/13  5:53 PM.  Always use your most recent med list.               acetaminophen 325 MG tablet  Commonly known as:  TYLENOL  Take 650 mg by mouth every morning.     baclofen 10 MG tablet  Commonly known as:  LIORESAL  Take 10 mg by mouth 3 (three) times daily.     donepezil 10 MG tablet  Commonly known as:  ARICEPT  Take 10 mg by mouth at bedtime.     DULoxetine 60 MG capsule  Commonly known as:  CYMBALTA  Take 60 mg by mouth daily.     food thickener Powd  Commonly known  as:  THICK IT  Take 1 Container by mouth as needed. Nectar thick liqiud     furosemide 20 MG tablet  Commonly known as:  LASIX  Take 20 mg by mouth 2 (two) times daily.     levothyroxine 150 MCG tablet  Commonly known as:  SYNTHROID, LEVOTHROID  Take 125 mcg by mouth daily before breakfast.     LORazepam 0.5 MG tablet  Commonly known as:  ATIVAN  1/2 by mouth every 8 hours as needed for anxiety     memantine 10 MG tablet  Commonly known as:  NAMENDA  Take 10 mg by mouth 2 (two) times daily.     Menthol-Zinc Oxide 0.44-20.625 % Oint  Apply 1 application topically 3 (three) times daily. For skin protector     oxyCODONE 5 MG immediate release tablet  Commonly known as:  Oxy IR/ROXICODONE  Take one tablet by mouth every 8 hours for pain     potassium chloride SA 20 MEQ tablet  Commonly known as:  K-DUR,KLOR-CON  Take 20 mEq by mouth daily.     PROTONIX 40 mg/20 mL Pack  Generic drug:  pantoprazole sodium  Place 40 mg into feeding tube daily.     Psyllium Husk Powd  Take 5 mLs by mouth 2 (two) times daily.     QUEtiapine 25 MG tablet  Commonly known as:  SEROQUEL  Take 12.5 mg by mouth at bedtime.        No orders of the defined types were placed in this encounter.    Immunization History  Administered Date(s) Administered  . Influenza-Unspecified 09/24/2012    History  Substance Use Topics  . Smoking status: Former Smoker -- .5 years    Types: Cigarettes  . Smokeless tobacco: Not on file  . Alcohol Use: Not on file    Review of Systems  DATA OBTAINED: from patient, nurse GENERAL: Feels well no fevers, fatigue, appetite changes SKIN: No itching, rash HEENT: No complaint RESPIRATORY: No cough, wheezing, SOB CARDIAC: No chest pain, palpitations, lower extremity edema  GI: No abdominal pain, No N/V/D or constipation, No heartburn or reflux  GU: No dysuria, frequency or urgency, or incontinence  MUSCULOSKELETAL: swelling R hand with pain, has had flares  before ;pt admits L hand hurts also; the nurse thinks she may have a h/o arthritis like RA NEUROLOGIC: No headache, dizziness  PSYCHIATRIC: No overt anxiety or sadness. Sleeps well.   Filed Vitals:   11/12/13 1745  BP: 117/70  Pulse: 71  Temp: 98.2 F (36.8 C)  Resp: 18    Physical Exam  GENERAL APPEARANCE: Alert, conversant. Appropriately groomed.looks uncomfortable SKIN: No diaphoresis rash, HEENT: Unremarkable RESPIRATORY: Breathing is even, unlabored. Lung sounds are clear   CARDIOVASCULAR: Heart RRR no murmurs, rubs or gallops. No peripheral edema  GASTROINTESTINAL: Abdomen is soft, non-tender, not distended w/ normal bowel sounds.  GENITOURINARY: Bladder non tender, not distended  MUSCULOSKELETAL: enlarged MCP joints both hands, more on R with some ulnar deviation of an IP joint R hand; no redness or heat NEUROLOGIC: Cranial nerves 2-12 grossly intact. Moves all extremities no tremor. PSYCHIATRIC: Mood and affect appropriate to situation, no behavioral issues  Patient Active Problem List   Diagnosis Date Noted  . Chronic pharyngitis 05/11/2013  . Acute respiratory failure 03/04/2013  . Tracheostomy status 03/04/2013  . Acute and chronic respiratory failure 03/04/2013  . Acute respiratory failure 02/28/2013  . Tracheostomy in place 02/28/2013  . Aspiration pneumonia 02/28/2013  . Shoulder dislocation 02/20/2013  . Bipolar affective disorder 02/15/2013  . Tardive dyskinesia 02/15/2013  . Essential hypertension, benign 02/15/2013  . COPD (chronic obstructive pulmonary disease) 02/15/2013  . Unspecified constipation 02/15/2013  . UTI (urinary tract infection) 02/15/2013  . Weight loss 02/15/2013  . Dysphagia 02/15/2013  . Chronic pain 02/15/2013  . Dementia arising in the senium and presenium 08/16/2012  . Fall 08/16/2012  . Pacemaker 08/16/2012  . Hypothyroidism 08/16/2012    CBC    Component Value Date/Time   WBC 11.4* 04/01/2013 0515   RBC 3.06* 04/01/2013  0515   HGB 10.5* 04/01/2013 0515   HCT 31.2* 04/01/2013 0515   PLT 256 04/01/2013 0515   MCV 102.0* 04/01/2013 0515   LYMPHSABS 2.5 03/21/2013 1140   MONOABS 1.3* 03/21/2013 1140   EOSABS 0.2 03/21/2013 1140   BASOSABS 0.0 03/21/2013 1140    CMP     Component Value Date/Time   NA 136 04/01/2013 0515   K 4.5 04/01/2013 0515   CL 100 04/01/2013 0515   CO2 26 04/01/2013 0515   GLUCOSE 124* 04/01/2013 0515   BUN 32* 04/01/2013 0515   CREATININE 0.67 04/01/2013 0515  CALCIUM 9.5 04/01/2013 0515   PROT 6.7 03/19/2013 0615   ALBUMIN 2.7* 03/19/2013 0615   AST 26 03/19/2013 0615   ALT 33 03/19/2013 0615   ALKPHOS 82 03/19/2013 0615   BILITOT 0.3 03/19/2013 0615   GFRNONAA 80* 04/01/2013 0515   GFRAA >90 04/01/2013 0515    Assessment and Plan  ARTHRITIS- there have been c/o inability to hold objects with her hands prior but today pt hands presented an arthritic process;I reviewed all EPIC notes , labs and imaging and pt has not been w/u for arthritis, at least as represented in EPIC; she had one ESR done during a hospitalization 8 months ago that was 65 so not helpful; had neg RA and ESR 66 2 months ago; still, based on looks of hands toDAY willorder RA, ESR, and anti-CCP antibody;regardless will send to rheumatology; will start Mobic 7.5 mg daily; pt already has pain medications  EXPOSURE TO INFLUENZA- PT HAD A NEGATIVE FLU TEST HERSELF AND IS BEING PROPHYLAXED WITH TAMIFLU 75 MG DAILY FOR A MINIMUM OF 14 DAYS  Time spent > 30 minutes  Margit Hanks, MD

## 2013-11-28 ENCOUNTER — Encounter (HOSPITAL_COMMUNITY): Payer: Self-pay | Admitting: Emergency Medicine

## 2013-11-28 ENCOUNTER — Other Ambulatory Visit: Payer: Self-pay | Admitting: Internal Medicine

## 2013-11-28 ENCOUNTER — Emergency Department (HOSPITAL_COMMUNITY): Payer: Medicare Other

## 2013-11-28 ENCOUNTER — Emergency Department (HOSPITAL_COMMUNITY)
Admission: EM | Admit: 2013-11-28 | Discharge: 2013-11-28 | Disposition: A | Discharge status: Home / Self Care | Payer: Medicare Other | Attending: Emergency Medicine | Admitting: Emergency Medicine

## 2013-11-28 DIAGNOSIS — Z8781 Personal history of (healed) traumatic fracture: Secondary | ICD-10-CM | POA: Insufficient documentation

## 2013-11-28 DIAGNOSIS — H9210 Otorrhea, unspecified ear: Secondary | ICD-10-CM | POA: Insufficient documentation

## 2013-11-28 DIAGNOSIS — Z8701 Personal history of pneumonia (recurrent): Secondary | ICD-10-CM | POA: Insufficient documentation

## 2013-11-28 DIAGNOSIS — J4489 Other specified chronic obstructive pulmonary disease: Secondary | ICD-10-CM | POA: Insufficient documentation

## 2013-11-28 DIAGNOSIS — R5381 Other malaise: Secondary | ICD-10-CM | POA: Insufficient documentation

## 2013-11-28 DIAGNOSIS — I5030 Unspecified diastolic (congestive) heart failure: Secondary | ICD-10-CM | POA: Insufficient documentation

## 2013-11-28 DIAGNOSIS — I4891 Unspecified atrial fibrillation: Secondary | ICD-10-CM | POA: Insufficient documentation

## 2013-11-28 DIAGNOSIS — L0390 Cellulitis, unspecified: Secondary | ICD-10-CM | POA: Insufficient documentation

## 2013-11-28 DIAGNOSIS — Z950 Presence of cardiac pacemaker: Secondary | ICD-10-CM | POA: Insufficient documentation

## 2013-11-28 DIAGNOSIS — I10 Essential (primary) hypertension: Secondary | ICD-10-CM | POA: Insufficient documentation

## 2013-11-28 DIAGNOSIS — H547 Unspecified visual loss: Secondary | ICD-10-CM | POA: Insufficient documentation

## 2013-11-28 DIAGNOSIS — R609 Edema, unspecified: Secondary | ICD-10-CM

## 2013-11-28 DIAGNOSIS — N289 Disorder of kidney and ureter, unspecified: Secondary | ICD-10-CM | POA: Insufficient documentation

## 2013-11-28 DIAGNOSIS — J449 Chronic obstructive pulmonary disease, unspecified: Secondary | ICD-10-CM | POA: Insufficient documentation

## 2013-11-28 DIAGNOSIS — Z79899 Other long term (current) drug therapy: Secondary | ICD-10-CM | POA: Insufficient documentation

## 2013-11-28 DIAGNOSIS — E039 Hypothyroidism, unspecified: Secondary | ICD-10-CM | POA: Insufficient documentation

## 2013-11-28 DIAGNOSIS — F319 Bipolar disorder, unspecified: Secondary | ICD-10-CM | POA: Insufficient documentation

## 2013-11-28 DIAGNOSIS — F0390 Unspecified dementia without behavioral disturbance: Secondary | ICD-10-CM | POA: Insufficient documentation

## 2013-11-28 DIAGNOSIS — M7989 Other specified soft tissue disorders: Secondary | ICD-10-CM | POA: Insufficient documentation

## 2013-11-28 DIAGNOSIS — H5789 Other specified disorders of eye and adnexa: Secondary | ICD-10-CM | POA: Insufficient documentation

## 2013-11-28 DIAGNOSIS — F411 Generalized anxiety disorder: Secondary | ICD-10-CM | POA: Insufficient documentation

## 2013-11-28 DIAGNOSIS — H61899 Other specified disorders of external ear, unspecified ear: Secondary | ICD-10-CM | POA: Insufficient documentation

## 2013-11-28 DIAGNOSIS — R5383 Other fatigue: Secondary | ICD-10-CM

## 2013-11-28 DIAGNOSIS — Z87891 Personal history of nicotine dependence: Secondary | ICD-10-CM | POA: Insufficient documentation

## 2013-11-28 LAB — CBC WITH DIFFERENTIAL/PLATELET
BASOS PCT: 0 % (ref 0–1)
Basophils Absolute: 0 10*3/uL (ref 0.0–0.1)
EOS ABS: 0.4 10*3/uL (ref 0.0–0.7)
Eosinophils Relative: 5 % (ref 0–5)
HCT: 36.8 % (ref 36.0–46.0)
HEMOGLOBIN: 12.3 g/dL (ref 12.0–15.0)
LYMPHS ABS: 1.8 10*3/uL (ref 0.7–4.0)
Lymphocytes Relative: 23 % (ref 12–46)
MCH: 33.1 pg (ref 26.0–34.0)
MCHC: 33.4 g/dL (ref 30.0–36.0)
MCV: 98.9 fL (ref 78.0–100.0)
Monocytes Absolute: 0.7 10*3/uL (ref 0.1–1.0)
Monocytes Relative: 9 % (ref 3–12)
NEUTROS ABS: 4.8 10*3/uL (ref 1.7–7.7)
NEUTROS PCT: 62 % (ref 43–77)
Platelets: 188 10*3/uL (ref 150–400)
RBC: 3.72 MIL/uL — AB (ref 3.87–5.11)
RDW: 13.6 % (ref 11.5–15.5)
WBC: 7.7 10*3/uL (ref 4.0–10.5)

## 2013-11-28 LAB — COMPREHENSIVE METABOLIC PANEL
ALBUMIN: 2.5 g/dL — AB (ref 3.5–5.2)
ALK PHOS: 98 U/L (ref 39–117)
ALT: 10 U/L (ref 0–35)
AST: 13 U/L (ref 0–37)
BUN: 40 mg/dL — ABNORMAL HIGH (ref 6–23)
CO2: 30 mEq/L (ref 19–32)
Calcium: 9.3 mg/dL (ref 8.4–10.5)
Chloride: 97 mEq/L (ref 96–112)
Creatinine, Ser: 0.67 mg/dL (ref 0.50–1.10)
GFR calc Af Amer: >90 mL/min (ref >90)
GFR calc non Af Amer: 79 mL/min — ABNORMAL LOW (ref >90)
Glucose, Bld: 94 mg/dL (ref 70–99)
POTASSIUM: 4.6 meq/L (ref 3.7–5.3)
Sodium: 138 mEq/L (ref 137–147)
TOTAL PROTEIN: 6.1 g/dL (ref 6.0–8.3)
Total Bilirubin: <0.2 mg/dL — ABNORMAL LOW (ref 0.3–1.2)

## 2013-11-28 LAB — SEDIMENTATION RATE: Sed Rate: 70 mm/hr — ABNORMAL HIGH (ref 0–22)

## 2013-11-28 MED ORDER — PIPERACILLIN SOD-TAZOBACTAM SO 2.25 (2-0.25) G IV SOLR
3.3750 g | INTRAVENOUS | Status: AC
  Administered 2013-11-28: 3.375 g via INTRAVENOUS
  Filled 2013-11-28: qty 3.38

## 2013-11-28 MED ORDER — MORPHINE SULFATE 2 MG/ML IJ SOLN
2.0000 mg | INTRAMUSCULAR | Status: DC | PRN
  Administered 2013-11-28: 2 mg via INTRAVENOUS
  Filled 2013-11-28: qty 1

## 2013-11-28 NOTE — ED Notes (Signed)
Vascular tech at bedside. °

## 2013-11-28 NOTE — ED Notes (Signed)
Per EMS[facility]  Pt c/o edema in rt arm which has some on a normal basis and today has increased.  Pt also has redness to rt eye with drainage and drainage rt from ear.  Vitals:  115/80, hr 80, 16, cbg 82.

## 2013-11-28 NOTE — Progress Notes (Signed)
[facility] a [facility] today that pt's son, Morton PetersBo, is requesting that his mom be sent back to the hospital due to increased swelling of her hand.  She has had this and it has gotten worse.  She chronically c/o pain all over.  We have been working with him to encourage a more palliative approach to her care.  She has tardive dyskinesia due to psychiatric medications over many years for her bipolar disorder.  At this time, she has a pressure ulcer on her right ear due to the repetitive movement causing her to hit her ear on anything she is sitting against (pillow, back of wheelchair, etc).  It is now infected and [facility] for this.  She is also noted to have conjunctivitis of the right eye (suspect was transmitted from the right ear) for which treatment would have begun today, but she is now going to the hospital.  She has severe protein-calorie malnutrition due to her dysphagia and dementia which is the most likely etiology of this swelling in combination with dependent edema and her prior clavicular fracture.   It would be beneficial for her and her son if a palliative care consult could be done while she is in the hospital.

## 2013-11-28 NOTE — Discharge Instructions (Signed)
Edema °Edema is a buildup of fluids. It is most common in the feet, ankles, and legs. This happens more as a person ages. It may affect one or both legs. °HOME CARE  °· Raise (elevate) the legs or ankles above the level of the heart while lying down. °· Avoid sitting or standing still for a long time. °· Exercise the legs to help the puffiness (swelling) go down. °· A low-salt diet may help lessen the puffiness. °· Only take medicine as told by your doctor. °GET HELP RIGHT AWAY IF:  °· You develop shortness of breath or chest pain. °· You cannot breathe when you lie down. °· You have more puffiness that does not go away with treatment. °· You develop pain or redness in the areas that are puffy. °· You have a temperature by mouth above 102° F (38.9° C), not controlled by medicine. °· You gain 03 lb/1.4 kg or more in 1 day or 05 lb/2.3 kg in a week. °MAKE SURE YOU:  °· Understand these instructions. °· Will watch your condition. °· Will get help right away if you are not doing well or get worse. °Document Released: 04/18/2008 Document Revised: 01/23/2012 Document Reviewed: 04/18/2008 °Exit[employer]® Patient Information ©2014 [employer]. ° °

## 2013-11-28 NOTE — ED Notes (Signed)
Bed: [id number] Expected date:  Expected time:  Means of arrival:  Comments: EMS-infection

## 2013-11-28 NOTE — ED Provider Notes (Addendum)
CSN: [id number]     Arrival date & time 11/28/13  1430 History   First MD Initiated Contact with Patient 11/28/13 1455     Chief Complaint  Patient presents with  . Arm Swelling  . Eye Drainage    HPI Patient presents to the emergency room because of moderate to severe edema and pain in her right arm. The patient states the pain increases with movement.  She is not sure how long this has been bothering her.  According to notes in the computer record, the patient was evaluated on December 30 at the nursing home for complaints of arm swelling and redness.  Patient has had symptoms similar to this in the past and it was attributed to an arthritis condition. According to EMS notes the symptoms have increased today. The patient was sent to the emergency room for further evaluation.   Past Medical History  Diagnosis Date  . COPD (chronic obstructive pulmonary disease)   . Hypertension   . Blind   . Hypothyroid   . Pacemaker   . Renal insufficiency   . Finger fracture   . Dementia   . Bipolar disorder   . Dysphagia     needs thick nectar liquids  . Atrial fibrillation   . Pneumonia   . Anxiety   . Diastolic heart failure    Past Surgical History  Procedure Laterality Date  . Insert / replace / remove pacemaker    . Tracheostomy  02/27/13    decannulated 03/29/13  . Gastrostomy tube placement  03/11/13   History reviewed. No pertinent family history. History  Substance Use Topics  . Smoking status: Former Smoker -- .5 years    Types: Cigarettes  . Smokeless tobacco: Not on file  . Alcohol Use: Not on file   OB History   Grav Para Term Preterm Abortions TAB SAB Ect Mult Living                 Review of Systems  Constitutional: Negative for fever.  HENT:       Ear drainage   Eyes: Positive for discharge.  Musculoskeletal:       No recent trauma  Skin: Negative for rash.  Psychiatric/Behavioral: Negative for agitation.  All other systems reviewed and are  negative.    Allergies  Review of patient's allergies indicates no known allergies.  Home Medications   Current Outpatient Rx  Name  Route  Sig  Dispense  Refill  . acetaminophen (TYLENOL) 500 MG tablet   Oral   Take 500 mg by mouth 3 (three) times daily.         . baclofen (LIORESAL) 20 MG tablet   Oral   Take 20 mg by mouth 3 (three) times daily.         . diazepam (VALIUM) 5 MG tablet   Oral   Take 5 mg by mouth at bedtime.         . divalproex (DEPAKOTE) 250 MG DR tablet   Oral   Take 250 mg by mouth 2 (two) times daily.         Marland Kitchen. donepezil (ARICEPT) 10 MG tablet   Oral   Take 10 mg by mouth at bedtime.         . DULoxetine (CYMBALTA) 60 MG capsule   Oral   Take 60 mg by mouth daily.         . furosemide (LASIX) 20 MG tablet   Oral   Take 20  mg by mouth 2 (two) times daily.         Marland Kitchen levothyroxine (SYNTHROID, LEVOTHROID) 125 MCG tablet   Oral   Take 125 mcg by mouth daily before breakfast.         . LORazepam (ATIVAN) 0.5 MG tablet   Oral   Take 0.25 mg by mouth every 12 (twelve) hours as needed. 1/2 by mouth every 8 hours as needed for anxiety         . meloxicam (MOBIC) 7.5 MG tablet   Oral   Take 7.5 mg by mouth daily.         . Memantine HCl ER (NAMENDA XR) 28 MG CP24   Oral   Take 28 mg by mouth daily.         . Nutritional Supplements (JEVITY) LIQD   Other   by Other route. 60ml/hour from 7pm to 7am.         . omeprazole (PRILOSEC) 40 MG capsule   Oral   Take 40 mg by mouth daily.         Marland Kitchen oxyCODONE (OXY IR/ROXICODONE) 5 MG immediate release tablet   Oral   Take 5 mg by mouth every 8 (eight) hours.         . Pollen Extracts (PROSTAT PO)   Oral   Take 1 packet by mouth 3 (three) times daily.         . potassium chloride SA (K-DUR,KLOR-CON) 20 MEQ tablet   Oral   Take 20 mEq by mouth daily.         . Psyllium Husk POWD   Oral   Take 5 mLs by mouth 2 (two) times daily.         . QUEtiapine  (SEROQUEL) 50 MG tablet   Oral   Take 50 mg by mouth at bedtime.         . vitamin C (ASCORBIC ACID) 500 MG tablet   Oral   Take 500 mg by mouth 2 (two) times daily.         Marland Kitchen zinc sulfate 220 MG capsule   Oral   Take 220 mg by mouth daily.         Marland Kitchen oseltamivir (TAMIFLU) 75 MG capsule   Oral   Take 75 mg by mouth daily. For 14 days for prophylaxis.          BP 119/70  Pulse 64  Temp(Src) 98.6 F (37 C) (Oral)  Resp 16  SpO2 90% Physical Exam  Nursing note and vitals reviewed. Constitutional: No distress.  Elderly, frail  HENT:  Head: Normocephalic and atraumatic.  Right Ear: Tympanic membrane and external ear normal.  Left Ear: Tympanic membrane and external ear normal.  Small lesion right outer ear, no erythema  Eyes: Right eye exhibits no discharge. Left eye exhibits discharge. Right conjunctiva is injected. Left conjunctiva is not injected. No scleral icterus.  Neck: Neck supple. No tracheal deviation present.  Cardiovascular: Normal rate, regular rhythm and intact distal pulses.   Pulmonary/Chest: Effort normal and breath sounds normal. No stridor. No respiratory distress. She has no wheezes. She has no rales.  Abdominal: Soft. Bowel sounds are normal. She exhibits no distension. There is no tenderness. There is no rebound and no guarding.  Musculoskeletal: She exhibits no edema and no tenderness.  Neurological: She is alert. No sensory deficit. Cranial nerve deficit: Left-sided facial droop. She exhibits normal muscle tone. She displays no seizure activity. Coordination normal.  General weakness, patient  has difficulty moving either her arms or legs off the bed, she is slow to respond initially but does answer questions  Skin: Skin is warm and dry. No rash noted.  Psychiatric: She has a normal mood and affect.    ED Course  Procedures (including critical care time) Labs Review Labs Reviewed  CBC WITH DIFFERENTIAL - Abnormal; Notable for the following:     RBC 3.72 (*)    All other components within normal limits  COMPREHENSIVE METABOLIC PANEL - Abnormal; Notable for the following:    BUN 40 (*)    Albumin 2.5 (*)    Total Bilirubin <0.2 (*)    GFR calc non Af Amer 79 (*)    All other components within normal limits  SEDIMENTATION RATE   Imaging Review Dg Shoulder Right  11/28/2013   CLINICAL DATA:  Arm swelling  EXAM: RIGHT SHOULDER - 2+ VIEW  COMPARISON:  None.  FINDINGS: This study is limited secondary to patient's limited range of motion. The bones are osteopenic. The appears to be inferior subluxation of the humeral head. No evidence of acute fracture nor overt dislocation is identified. There are chronic rib fractures along the lateral aspect of the upper right hemothorax. Prominence of interstitial markings appreciated cells peribronchial cuffing within the lung parenchyma without focal regions of consolidation.  IMPRESSION: 1. Inferior subluxation of the humeral head without evidence of acute fracture nor overt dislocation 2. Chronic rib fractures 3. Findings which may represent underlying component of pulmonary edema.   Electronically Signed   By: Salome Holmes M.D.   On: 11/28/2013 16:43   Dg Elbow Complete Right  11/28/2013   CLINICAL DATA:  Elbow pain and limited range of motion.  EXAM: RIGHT ELBOW - COMPLETE 3+ VIEW  COMPARISON:  None.  FINDINGS: Suboptimal exam due to nonstandard positioning. No fracture or dislocation visualized. Osteopenia noted. Elbow joint effusion cannot definitely be excluded given the lack of a true lateral view. Diffuse soft tissue swelling or edema noted. Generalized osteopenia also demonstrated.  IMPRESSION: Suboptimal exam due to nonstandard positioning. No fracture identified. Elbow joint effusion cannot definitely be excluded. If there is persistent clinical concern for fracture, consider followup elbow radiographs in 7 days.   Electronically Signed   By: Myles Rosenthal M.D.   On: 11/28/2013 16:42   Doppler  study right UE: NO DVT  Nursing home notes reviewed.  Pt with multiple medical problems.  Sent in for evaluation of her chronic arm edema.  Also requesting a paliative care consult.   Medications  morphine 2 MG/ML injection 2 mg (2 mg Intravenous Given 11/28/13 1542)  piperacillin-tazobactam (ZOSYN) IVPB 3.375 g (not administered)     MDM   1. Cellulitis      Pt has erythema and edema of her right arm.  No DVT noted on ultrasound.  Suspect cellulitis of her upper extremity.  No definite effusion,.  Pt has some pain with elbow movement.  Doubt joint involvement but will need to monitor.  Will consult with medical service regarding admission for IV abx.    Celene Kras, MD 11/28/13 [phone]  Did discuss case with Dr Renato Gails from [facility].  Also spoke with patient's son who is concerned about pt's worsening health and  Feels that she has become worse in the nursing facility.  Would like pt to be admitted to the hospital for treatment.  Celene Kras, MD 11/28/13 1731  Pt was seen by Dr Jomarie Longs.  I re-examined the patient as  well.  She does not have any erythema or warmth on her right arm now.  It may have been position earlier.    Hospitalization is not indicated at this time.   Will dc back to facility.  Celene Kras, MD 11/28/13 [phone]

## 2013-11-28 NOTE — Progress Notes (Signed)
*  Preliminary Results* Right upper extremity venous duplex completed. Study was technically limited due to poor patient cooperation and edema. Visualized veins of the right upper extremity are negative for deep and superficial vein thrombosis.  Preliminary results discussed with Dr.Knapp.  11/28/2013 3:54 PM  Gertie FeyMichelle Lynzee Lindquist, RVT, RDCS, RDMS

## 2013-11-28 NOTE — Consult Note (Addendum)
[facility] Medical Consultation  Saher Davee ZOX:[id number] DOB: 08/22/1928 DOA: 11/28/2013 PCP: Bufford Spikes, DO   Requesting physician: EDP Date of consultation: 11/28/13 Reason for consultation: R arm swelling  Impression/Recommendations 1. R arm swelling -suspect this is positional and and contributed by Inferior subluxation of humeral head -clinically no evidence of cellulitis -dopplers negative for DVT -I donot see the need for admission or any antibiotics -She needs outpatient Orthopedic eval  -d/w EDP  Zannie Cove, MD [phone]   Chief Complaint:   HPI: 85/F from SNF with bipolar disorder, dementia, COPD, torticollis was sent to the ER for evaluation of R arm swelling, which is progressive for weeks. No h/o fall or direct trauma. No fevers or chills, has dementia and history rather limited   Review of Systems:  Limited due to mentation  Past Medical History  Diagnosis Date  . COPD (chronic obstructive pulmonary disease)   . Hypertension   . Blind   . Hypothyroid   . Pacemaker   . Renal insufficiency   . Finger fracture   . Dementia   . Bipolar disorder   . Dysphagia     needs thick nectar liquids  . Atrial fibrillation   . Pneumonia   . Anxiety   . Diastolic heart failure    Past Surgical History  Procedure Laterality Date  . Insert / replace / remove pacemaker    . Tracheostomy  02/27/13    decannulated 03/29/13  . Gastrostomy tube placement  03/11/13   Social History:  reports that she has quit smoking. Her smoking use included Cigarettes. She smoked 0.00 packs per day for .5 years. She does not have any smokeless tobacco history on file. She reports that she does not use illicit drugs. Her alcohol history is not on file.  No Known Allergies History reviewed. No pertinent family history.  Prior to Admission medications   Medication Sig Start Date End Date Taking? Authorizing Provider  acetaminophen (TYLENOL) 500 MG tablet Take 500 mg by  mouth 3 (three) times daily.   Yes Historical Provider, MD  baclofen (LIORESAL) 20 MG tablet Take 20 mg by mouth 3 (three) times daily.   Yes Historical Provider, MD  diazepam (VALIUM) 5 MG tablet Take 5 mg by mouth at bedtime.   Yes Historical Provider, MD  divalproex (DEPAKOTE) 250 MG DR tablet Take 250 mg by mouth 2 (two) times daily.   Yes Historical Provider, MD  donepezil (ARICEPT) 10 MG tablet Take 10 mg by mouth at bedtime.   Yes Historical Provider, MD  DULoxetine (CYMBALTA) 60 MG capsule Take 60 mg by mouth daily.   Yes Historical Provider, MD  furosemide (LASIX) 20 MG tablet Take 20 mg by mouth 2 (two) times daily.   Yes Historical Provider, MD  levothyroxine (SYNTHROID, LEVOTHROID) 125 MCG tablet Take 125 mcg by mouth daily before breakfast.   Yes Historical Provider, MD  LORazepam (ATIVAN) 0.5 MG tablet Take 0.25 mg by mouth every 12 (twelve) hours as needed. 1/2 by mouth every 8 hours as needed for anxiety 10/03/13  Yes Claudie Revering, NP  meloxicam (MOBIC) 7.5 MG tablet Take 7.5 mg by mouth daily.   Yes Historical Provider, MD  Memantine HCl ER (NAMENDA XR) 28 MG CP24 Take 28 mg by mouth daily.   Yes Historical Provider, MD  Nutritional Supplements (JEVITY) LIQD by Other route. 72ml/hour from 7pm to 7am.   Yes Historical Provider, MD  omeprazole (PRILOSEC) 40 MG capsule Take 40 mg by mouth daily.  Yes Historical Provider, MD  oxyCODONE (OXY IR/ROXICODONE) 5 MG immediate release tablet Take 5 mg by mouth every 8 (eight) hours. 10/28/13  Yes [name] L [name], DO  Pollen Extracts (PROSTAT PO) Take 1 packet by mouth 3 (three) times daily.   Yes Historical Provider, MD  potassium chloride SA (K-DUR,KLOR-CON) 20 MEQ tablet Take 20 mEq by mouth daily.   Yes Historical Provider, MD  Psyllium Husk POWD Take 5 mLs by mouth 2 (two) times daily.   Yes Historical Provider, MD  QUEtiapine (SEROQUEL) 50 MG tablet Take 50 mg by mouth at bedtime.   Yes Historical Provider, MD  vitamin C (ASCORBIC  ACID) 500 MG tablet Take 500 mg by mouth 2 (two) times daily.   Yes Historical Provider, MD  zinc sulfate 220 MG capsule Take 220 mg by mouth daily.   Yes Historical Provider, MD  oseltamivir (TAMIFLU) 75 MG capsule Take 75 mg by mouth daily. For 14 days for prophylaxis. 10/31/13   Historical Provider, MD   Physical Exam: Blood pressure 119/70, pulse 64, temperature 98.6 F (37 C), temperature source Oral, resp. rate 16, SpO2 90.00%. Filed Vitals:   11/28/13 1440  BP: 119/70  Pulse: 64  Temp: 98.6 F (37 C)  Resp: 16     General:  Alert, awake, oriented to sefl  HEENT:PERRLA, poor dental hygiene  Cardiovascular: S1S2/RRR  Respiratory: CTAB  Abdomen: soft, Nt, BS present  Skin: no rashes or skin breakdown  Musculoskeletal: mild swelling of the entire R arm and shoulder, limited RoM of shoulder  Psychiatric:unable to assess    Labs on Admission:  Basic Metabolic Panel:  Recent Labs Lab 11/28/13 1530  NA 138  K 4.6  CL 97  CO2 30  GLUCOSE 94  BUN 40*  CREATININE 0.67  CALCIUM 9.3   Liver Function Tests:  Recent Labs Lab 11/28/13 1530  AST 13  ALT 10  ALKPHOS 98  BILITOT <0.2*  PROT 6.1  ALBUMIN 2.5*   No results found for this basename: LIPASE, AMYLASE,  in the last 168 hours No results found for this basename: AMMONIA,  in the last 168 hours CBC:  Recent Labs Lab 11/28/13 1530  WBC 7.7  NEUTROABS 4.8  HGB 12.3  HCT 36.8  MCV 98.9  PLT 188   Cardiac Enzymes: No results found for this basename: CKTOTAL, CKMB, CKMBINDEX, TROPONINI,  in the last 168 hours BNP: No components found with this basename: POCBNP,  CBG: No results found for this basename: GLUCAP,  in the last 168 hours  Radiological Exams on Admission: Dg Shoulder Right  11/28/2013   CLINICAL DATA:  Arm swelling  EXAM: RIGHT SHOULDER - 2+ VIEW  COMPARISON:  None.  FINDINGS: This study is limited secondary to patient's limited range of motion. The bones are osteopenic. The  appears to be inferior subluxation of the humeral head. No evidence of acute fracture nor overt dislocation is identified. There are chronic rib fractures along the lateral aspect of the upper right hemothorax. Prominence of interstitial markings appreciated cells peribronchial cuffing within the lung parenchyma without focal regions of consolidation.  IMPRESSION: 1. Inferior subluxation of the humeral head without evidence of acute fracture nor overt dislocation 2. Chronic rib fractures 3. Findings which may represent underlying component of pulmonary edema.   Electronically Signed   By: Salome HolmesHector  Cooper M.D.   On: 11/28/2013 16:43   Dg Elbow Complete Right  11/28/2013   CLINICAL DATA:  Elbow pain and limited range of motion.  EXAM: RIGHT ELBOW - COMPLETE 3+ VIEW  COMPARISON:  None.  FINDINGS: Suboptimal exam due to nonstandard positioning. No fracture or dislocation visualized. Osteopenia noted. Elbow joint effusion cannot definitely be excluded given the lack of a true lateral view. Diffuse soft tissue swelling or edema noted. Generalized osteopenia also demonstrated.  IMPRESSION: Suboptimal exam due to nonstandard positioning. No fracture identified. Elbow joint effusion cannot definitely be excluded. If there is persistent clinical concern for fracture, consider followup elbow radiographs in 7 days.   Electronically Signed   By: Myles Rosenthal M.D.   On: 11/28/2013 16:42    Time spent:  [facility] Pager [phone]  If 7PM-7AM, please contact night-coverage [email or web address] Password [facility] 11/28/2013, 6:13 PM

## 2013-11-29 ENCOUNTER — Other Ambulatory Visit: Payer: Self-pay | Admitting: Internal Medicine

## 2013-11-29 NOTE — Progress Notes (Signed)
Last evening, Dr. Lynelle DoctorKnapp called me from the ED to discuss [name]'s condition.  He felt she needed some IV abx for her swollen arm.  I agreed with a return to the facility with IV antibiotics would be appropriate as long as the pt's son, [name], was contacted with the plans.

## 2013-11-30 ENCOUNTER — Non-Acute Institutional Stay (SKILLED_NURSING_FACILITY): Payer: Medicare Other | Admitting: Internal Medicine

## 2013-11-30 ENCOUNTER — Encounter: Payer: Self-pay | Admitting: Internal Medicine

## 2013-11-30 DIAGNOSIS — H60399 Other infective otitis externa, unspecified ear: Secondary | ICD-10-CM

## 2013-11-30 DIAGNOSIS — H109 Unspecified conjunctivitis: Secondary | ICD-10-CM

## 2013-11-30 DIAGNOSIS — S01309A Unspecified open wound of unspecified ear, initial encounter: Secondary | ICD-10-CM

## 2013-11-30 DIAGNOSIS — H6011 Cellulitis of right external ear: Secondary | ICD-10-CM

## 2013-11-30 NOTE — Progress Notes (Signed)
MRN: [id number] Name: [name]  Sex: female Age: 80 y.o. DOB: 1928/07/25  PSC #:  Facility/Room: Level Of Care: SNF Provider: Merrilee Seashore D Emergency Contacts: Extended Emergency Contact Information Primary Emergency Contact: Moncrief,[name] Address: [address]           [place], [place] [address] [place] Home Phone: [phone] Relation: Son Secondary Emergency Contact: Mondragon,Hope Address: [address] [place] [address] [place] Home Phone: [phone] Relation: Daughter  Code Status:   Allergies: Review of patient's allergies indicates no known allergies.  Chief Complaint  Patient presents with  . Acute Visit    HPI: Patient is 80 y.o. female who   Past Medical History  Diagnosis Date  . COPD (chronic obstructive pulmonary disease)   . Hypertension   . Blind   . Hypothyroid   . Pacemaker   . Renal insufficiency   . Finger fracture   . Dementia   . Bipolar disorder   . Dysphagia     needs thick nectar liquids  . Atrial fibrillation   . Pneumonia   . Anxiety   . Diastolic heart failure     Past Surgical History  Procedure Laterality Date  . Insert / replace / remove pacemaker    . Tracheostomy  02/27/13    decannulated 03/29/13  . Gastrostomy tube placement  03/11/13      Medication List       This list is accurate as of: 11/30/13  5:30 PM.  Always use your most recent med list.               acetaminophen 500 MG tablet  Commonly known as:  TYLENOL  Take 500 mg by mouth 3 (three) times daily.     baclofen 20 MG tablet  Commonly known as:  LIORESAL  Take 20 mg by mouth 3 (three) times daily.     diazepam 5 MG tablet  Commonly known as:  VALIUM  Take 5 mg by mouth at bedtime.     divalproex 250 MG DR tablet  Commonly known as:  DEPAKOTE  Take 250 mg by mouth 2 (two) times daily.     donepezil 10 MG tablet  Commonly known as:  ARICEPT  Take 10 mg by mouth at bedtime.     DULoxetine 60 MG capsule  Commonly known as:  CYMBALTA  Take 60 mg by mouth daily.     furosemide 20 MG tablet  Commonly known as:  LASIX  Take 20 mg by mouth 2 (two) times daily.     JEVITY Liqd  by Other route. 25ml/hour from 7pm to 7am.     levothyroxine 125 MCG tablet  Commonly known as:  SYNTHROID, LEVOTHROID  Take 125 mcg by mouth daily before breakfast.     LORazepam 0.5 MG tablet  Commonly known as:  ATIVAN  Take 0.25 mg by mouth every 12 (twelve) hours as needed. 1/2 by mouth every 8 hours as needed for anxiety     meloxicam 7.5 MG tablet  Commonly known as:  MOBIC  Take 7.5 mg by mouth daily.     NAMENDA XR 28 MG Cp24  Generic drug:  Memantine HCl ER  Take 28 mg by mouth daily.     omeprazole 40 MG capsule  Commonly known as:  PRILOSEC  Take 40 mg by mouth daily.     oseltamivir 75 MG capsule  Commonly known  as:  TAMIFLU  Take 75 mg by mouth daily. For 14 days for prophylaxis.     oxyCODONE 5 MG immediate release tablet  Commonly known as:  Oxy IR/ROXICODONE  Take 5 mg by mouth every 8 (eight) hours.     potassium chloride SA 20 MEQ tablet  Commonly known as:  K-DUR,KLOR-CON  Take 20 mEq by mouth daily.     PROSTAT PO  Take 1 packet by mouth 3 (three) times daily.     Psyllium Husk Powd  Take 5 mLs by mouth 2 (two) times daily.     QUEtiapine 50 MG tablet  Commonly known as:  SEROQUEL  Take 50 mg by mouth at bedtime.     vitamin C 500 MG tablet  Commonly known as:  ASCORBIC ACID  Take 500 mg by mouth 2 (two) times daily.     zinc sulfate 220 MG capsule  Take 220 mg by mouth daily.        No orders of the defined types were placed in this encounter.    Immunization History  Administered Date(s) Administered  . Influenza-Unspecified 09/24/2012    History  Substance Use Topics  . Smoking status: Former Smoker -- .5 years    Types: Cigarettes  . Smokeless tobacco: Not on file  . Alcohol Use: Not on file    Review of Systems  DATA  OBTAINED: from patient, nurse, GENERAL: Feels well no fevers, fatigue, appetite changes SKIN: No itching, rash HEENT - Pt's ear is redder today and R eye has some drainage RESPIRATORY: No cough, wheezing, SOB CARDIAC: No chest pain, palpitations, lower extremity edema  GI: No abdominal pain, No N/V/D or constipation, No heartburn or reflux  GU: No dysuria, frequency or urgency, or incontinence  MUSCULOSKELETAL: No unrelieved bone/joint pain NEUROLOGIC: No headache, dizziness or new focal weakness PSYCHIATRIC: No overt anxiety or sadness. Sleeps well.   Filed Vitals:   11/30/13 1728  BP: 134/62  Pulse: 66  Temp: 98 F (36.7 C)  Resp: 20    Physical Exam  GENERAL APPEARANCE: Alert, mod conversant. Appropriately groomed. No acute distress  SKIN: No diaphoresis rash HEENT: R pinna is mod R eye- mild conjunctival injection with mod yellow exudate lateral canthus red, minimal swelling;open wound eraser sized with no fluctuance RESPIRATORY: Breathing is even, unlabored. Lung sounds are clear   CARDIOVASCULAR: Heart RRR no murmurs, rubs or gallops. No peripheral edema  GASTROINTESTINAL: Abdomen is soft, non-tender, not distended w/ normal bowel sounds.  GENITOURINARY: Bladder non tender, not distended  NEUROLOGIC: Cranial nerves 2-12 grossly intact except pt is blind PSYCHIATRIC: Mood and affect appropriate to situation, no behavioral issues  Patient Active Problem List   Diagnosis Date Noted  . Chronic pharyngitis 05/11/2013  . Acute respiratory failure 03/04/2013  . Tracheostomy status 03/04/2013  . Acute and chronic respiratory failure 03/04/2013  . Acute respiratory failure 02/28/2013  . Tracheostomy in place 02/28/2013  . Aspiration pneumonia 02/28/2013  . Shoulder dislocation 02/20/2013  . Bipolar affective disorder 02/15/2013  . Tardive dyskinesia 02/15/2013  . Essential hypertension, benign 02/15/2013  . COPD (chronic obstructive pulmonary disease) 02/15/2013  .  Unspecified constipation 02/15/2013  . UTI (urinary tract infection) 02/15/2013  . Weight loss 02/15/2013  . Dysphagia 02/15/2013  . Chronic pain 02/15/2013  . Dementia arising in the senium and presenium 08/16/2012  . Fall 08/16/2012  . Pacemaker 08/16/2012  . Hypothyroidism 08/16/2012    CBC    Component Value Date/Time   WBC 7.7 11/28/2013  1530   RBC 3.72* 11/28/2013 1530   HGB 12.3 11/28/2013 1530   HCT 36.8 11/28/2013 1530   PLT 188 11/28/2013 1530   MCV 98.9 11/28/2013 1530   LYMPHSABS 1.8 11/28/2013 1530   MONOABS 0.7 11/28/2013 1530   EOSABS 0.4 11/28/2013 1530   BASOSABS 0.0 11/28/2013 1530    CMP     Component Value Date/Time   NA 138 11/28/2013 1530   K 4.6 11/28/2013 1530   CL 97 11/28/2013 1530   CO2 30 11/28/2013 1530   GLUCOSE 94 11/28/2013 1530   BUN 40* 11/28/2013 1530   CREATININE 0.67 11/28/2013 1530   CALCIUM 9.3 11/28/2013 1530   PROT 6.1 11/28/2013 1530   ALBUMIN 2.5* 11/28/2013 1530   AST 13 11/28/2013 1530   ALT 10 11/28/2013 1530   ALKPHOS 98 11/28/2013 1530   BILITOT <0.2* 11/28/2013 1530   GFRNONAA 79* 11/28/2013 1530   GFRAA >90 11/28/2013 1530    Assessment and Plan  R PINNA CELLULITIS AND WOUND - WILL START DOXYCYCLINE 100 mg BID for 7 days  RIGHT CONJUNCTIVITIS - start garamycin ointment to lower lid QID for  7 days  Margit Hanks, MD

## 2013-12-22 IMAGING — CR DG CHEST 1V PORT
1 series · 1 of 1 positions shown · non-contrast
Comparison: 03/08/2013; 02/28/2013

CLINICAL DATA: Respiratory failure

PORTABLE CHEST - 1 VIEW

[AP]
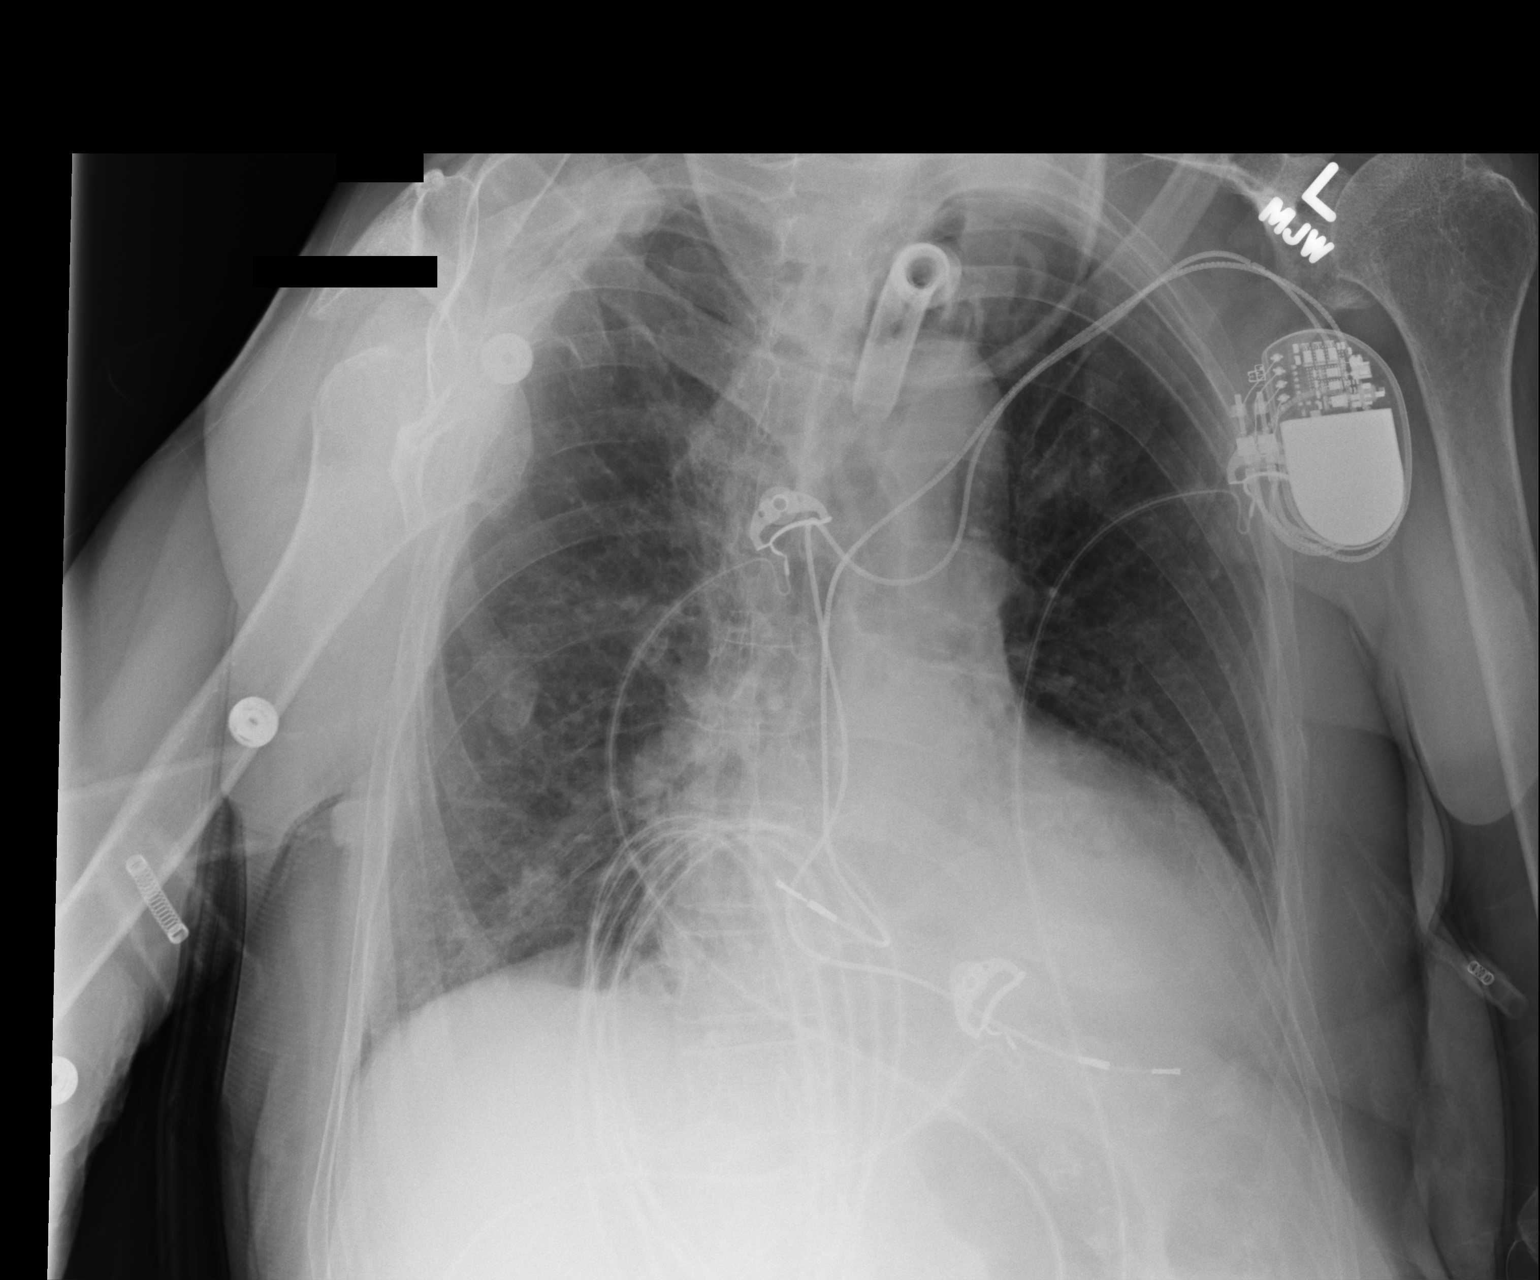

[1 of 1 positions shown; findings below may reference images not displayed]

FINDINGS: Grossly unchanged enlarged cardiac silhouette and mediastinal
contours given kyphotic projection.  Minimal worsening of bibasilar
heterogeneous opacities, left greater than right.  Mild
cephalization of flow without frank evidence of edema.  Trace left-
sided effusion is not excluded.  Grossly unchanged deformity
involving the anterior aspect of the right fifth rib.  Apparent
anterior inferior subluxation of the right glenohumeral joint
likely artifactual accentuated due to kyphotic projection and
internal rotation.
IMPRESSION: 1.  Suspected worsening of pulmonary venous congestion/mild
pulmonary edema.
2.  Worsening bibasilar opacities, left greater than right,
atelectasis versus infiltrate.
3.  Apparent anterior/inferior subluxation of the right
glenohumeral joint likely accentuated due to obliquity and patient
positioning. Continued attention on follow-up is recommended.

## 2014-01-29 ENCOUNTER — Encounter: Payer: Self-pay | Admitting: Internal Medicine

## 2014-01-29 ENCOUNTER — Non-Acute Institutional Stay (SKILLED_NURSING_FACILITY): Payer: Medicare Other | Admitting: Internal Medicine

## 2014-01-29 DIAGNOSIS — H1031 Unspecified acute conjunctivitis, right eye: Secondary | ICD-10-CM

## 2014-01-29 DIAGNOSIS — H10029 Other mucopurulent conjunctivitis, unspecified eye: Secondary | ICD-10-CM

## 2014-01-29 NOTE — Progress Notes (Signed)
Patient ID: [name], female   DOB: 1927/12/20, 80 y.o.   MRN: [id number]  Location:  [facility] Provider:  Gwenith Spitz. Renato Gails, D.O., C.M.D.  Code Status:  Full code  Chief Complaint  Patient presents with  . Acute Visit    green drainage to right eye yesterday    HPI:  80 yo long term care residemt seen for acute visit due to green drainage from her left eye since yesterday.  It is itchy, sore, and blurry due to the drainage.  She has no other symptoms like fever, cough, sore throat or congestion.    Review of Systems:  Review of Systems  Constitutional: Negative for fever.  HENT: Negative for congestion.   Eyes: Positive for blurred vision, pain, discharge and redness.       Green d/c  Respiratory: Positive for cough and wheezing. Negative for shortness of breath.   Cardiovascular: Negative for chest pain.  Gastrointestinal: Negative for constipation.  Genitourinary: Negative for dysuria.  Musculoskeletal: Negative for falls.  Neurological: Negative for dizziness.  Psychiatric/Behavioral: Positive for depression and memory loss.    Medications: Patient's Medications  New Prescriptions   FAMOTIDINE (PEPCID) 20 MG TABLET    Take 1 tablet (20 mg total) by mouth 2 (two) times daily.   IPRATROPIUM-ALBUTEROL (DUONEB) 0.5-2.5 (3) MG/3ML SOLN    Take 3 mLs by nebulization every 4 (four) hours as needed.   LEVOTHYROXINE (SYNTHROID, LEVOTHROID) 112 MCG TABLET    Take 1 tablet (112 mcg total) by mouth daily.  Previous Medications   DOCUSATE (COLACE) 50 MG/5ML LIQUID    Take 100 mg by mouth 2 (two) times daily.   DONEPEZIL (ARICEPT) 10 MG TABLET    Take 10 mg by mouth at bedtime.   DULOXETINE (CYMBALTA) 60 MG CAPSULE    Take 60 mg by mouth daily.   FUROSEMIDE (LASIX) 20 MG TABLET    Take 20 mg by mouth daily.   LACTOBACILLUS (FLORANEX/LACTINEX) PACK    Take 1 g by mouth 2 (two) times daily with a meal.   LIDOCAINE (LIDODERM) 5 %    Place 1 patch onto the skin daily.  Remove & Discard patch within 12 hours or as directed by MD   MEMANTINE HCL ER (NAMENDA XR) 28 MG CP24    Take 1 capsule by mouth at bedtime.   MULTIPLE VITAMINS-MINERALS (CENTAMIN) LIQD    Take 5 mLs by mouth daily.   NUTRITIONAL SUPPLEMENTS (FEEDING SUPPLEMENT, OSMOLITE 1.5 CAL,) LIQD    Place 60 mLs into feeding tube continuous.   POLYETHYLENE GLYCOL (MIRALAX / GLYCOLAX) PACKET    Take 17 g by mouth daily.   QUETIAPINE (SEROQUEL) 50 MG TABLET    Take 50 mg by mouth at bedtime.   VALPROIC ACID 500 MG CPDR    Take 500 mg by mouth 2 (two) times daily.  Modified Medications   Modified Medication Previous Medication   INSULIN ASPART (NOVOLOG) 100 UNIT/ML INJECTION insulin aspart (NOVOLOG) 100 UNIT/ML injection      Inject 5 Units into the skin every 6 (six) hours. CBG >150    Inject 0-15 Units into the skin every 4 (four) hours.   OXYCODONE (ROXICODONE) 5 MG IMMEDIATE RELEASE TABLET oxyCODONE (ROXICODONE) 5 MG immediate release tablet      Take one tablet by mouth every 8 hours for pain    Take one tablet by mouth every 4 hours as needed for pain; Take one tablet by mouth every 8 hours for pain  Discontinued Medications   ACETAMINOPHEN (TYLENOL) 500 MG TABLET    Take 500 mg by mouth 3 (three) times daily.   BACLOFEN (LIORESAL) 20 MG TABLET    Take 20 mg by mouth 3 (three) times daily.   DIAZEPAM (VALIUM) 5 MG TABLET    Take 5 mg by mouth at bedtime.   DIVALPROEX (DEPAKOTE) 250 MG DR TABLET    Take 250 mg by mouth 2 (two) times daily.   FUROSEMIDE (LASIX) 20 MG TABLET    Take 20 mg by mouth 2 (two) times daily.   LEVOTHYROXINE (SYNTHROID, LEVOTHROID) 125 MCG TABLET    Take 125 mcg by mouth daily before breakfast.   LORAZEPAM (ATIVAN) 0.5 MG TABLET    Take 0.25 mg by mouth every 12 (twelve) hours as needed. 1/2 by mouth every 8 hours as needed for anxiety   MELOXICAM (MOBIC) 7.5 MG TABLET    Take 7.5 mg by mouth daily.   MEMANTINE HCL ER (NAMENDA XR) 28 MG CP24    Take 28 mg by mouth daily.    NUTRITIONAL SUPPLEMENTS (JEVITY) LIQD    by Other route. 30ml/hour from 7pm to 7am.   OMEPRAZOLE (PRILOSEC) 40 MG CAPSULE    Take 40 mg by mouth daily.   OSELTAMIVIR (TAMIFLU) 75 MG CAPSULE    Take 75 mg by mouth daily. For 14 days for prophylaxis.   OXYCODONE (OXY IR/ROXICODONE) 5 MG IMMEDIATE RELEASE TABLET    Take 5 mg by mouth every 8 (eight) hours.   POLLEN EXTRACTS (PROSTAT PO)    Take 1 packet by mouth 3 (three) times daily.   POTASSIUM CHLORIDE SA (K-DUR,KLOR-CON) 20 MEQ TABLET    Take 20 mEq by mouth daily.   PSYLLIUM HUSK POWD    Take 5 mLs by mouth 2 (two) times daily.   VITAMIN C (ASCORBIC ACID) 500 MG TABLET    Take 500 mg by mouth 2 (two) times daily.   ZINC SULFATE 220 MG CAPSULE    Take 220 mg by mouth daily.    Physical Exam: Filed Vitals:   01/29/14 1406  BP: 120/78  Pulse: 78  Temp: 98.6 F (37 C)  Resp: 20   Physical Exam  Constitutional:  Frail white female  HENT:  Head: Normocephalic and atraumatic.  Eyes:  Right eye with green thick drainage, erythematous conjunctiva, evidence of excoriation  Cardiovascular: Normal rate, regular rhythm, normal heart sounds and intact distal pulses.   Pulmonary/Chest: Effort normal and breath sounds normal.     Labs reviewed: Basic Metabolic Panel:  Recent Labs  16/10/96 0400 06/11/14 0450  06/23/14 0500 06/26/14 0645 06/30/14 0614  NA 147 146  < > 144 142 139  K 3.6* 4.2  < > 4.5 4.7 4.2  CL 103 101  < > 100 100 99  CO2 31 31  < > 33* 30 32  GLUCOSE 113* 107*  < > 115* 73 110*  BUN 29* 26*  < > 33* 28* 23  CREATININE 0.58 0.44*  < > 0.59 0.60 0.55  CALCIUM 9.2 9.6  < > 9.6 9.1 9.2  MG 1.9 1.8  --  2.3  --   --   PHOS 3.4 2.3  --  2.9  --   --   < > = values in this interval not displayed.  Liver Function Tests:  Recent Labs  11/28/13 1530 06/12/14 0500 06/30/14 0614  AST 13 22 12   ALT 10 14 7   ALKPHOS 98 71 92  BILITOT <  0.2* <0.2* <0.2*  PROT 6.1 6.6 6.8  ALBUMIN 2.5* 2.4* 2.6*     CBC:  Recent Labs  06/12/14 0500  06/23/14 0500 06/26/14 0645 06/30/14 0614  WBC 10.2  < > 6.3 5.5 5.1  NEUTROABS 6.5  --  3.1  --  2.3  HGB 10.4*  < > 10.7* 10.0* 10.8*  HCT 32.1*  < > 34.2* 32.1* 33.4*  MCV 103.2*  < > 105.6* 104.9* 106.4*  PLT 293  < > 330 302 234  < > = values in this interval not displayed.   Assessment/Plan Bacterial conjunctivitis:  cipro 1 drop both eyes every 2 hours while awake x 2 days, then q 4 hrs x 5 days  Family/ staff Communication: discussed with nurse  Goals of care: remains full code, her son wants to keep her around as long as possible

## 2014-05-21 ENCOUNTER — Encounter: Payer: Self-pay | Admitting: Internal Medicine

## 2014-05-21 ENCOUNTER — Non-Acute Institutional Stay (SKILLED_NURSING_FACILITY): Payer: Medicare Other | Admitting: Internal Medicine

## 2014-05-21 DIAGNOSIS — F3175 Bipolar disorder, in partial remission, most recent episode depressed: Secondary | ICD-10-CM

## 2014-05-21 DIAGNOSIS — K2100 Gastro-esophageal reflux disease with esophagitis, without bleeding: Secondary | ICD-10-CM

## 2014-05-21 DIAGNOSIS — IMO0002 Reserved for concepts with insufficient information to code with codable children: Secondary | ICD-10-CM

## 2014-05-21 DIAGNOSIS — E0789 Other specified disorders of thyroid: Secondary | ICD-10-CM

## 2014-05-21 DIAGNOSIS — E034 Atrophy of thyroid (acquired): Secondary | ICD-10-CM

## 2014-05-21 DIAGNOSIS — J439 Emphysema, unspecified: Secondary | ICD-10-CM

## 2014-05-21 DIAGNOSIS — F0390 Unspecified dementia without behavioral disturbance: Secondary | ICD-10-CM

## 2014-05-21 DIAGNOSIS — E038 Other specified hypothyroidism: Secondary | ICD-10-CM

## 2014-05-21 DIAGNOSIS — J438 Other emphysema: Secondary | ICD-10-CM

## 2014-05-21 DIAGNOSIS — G8929 Other chronic pain: Secondary | ICD-10-CM

## 2014-05-21 MED ORDER — LEVOTHYROXINE SODIUM 112 MCG PO TABS: 112.0000 ug | ORAL_TABLET | Freq: Every day | ORAL | Status: DC

## 2014-05-21 NOTE — Progress Notes (Signed)
Patient ID: [name], female   DOB: 01/29/1928, 80 y.o.   MRN: [id number]  [facility] Provider:  Gwenith Spitziffany L. Renato Gailseed, D.O., C.M.D.  Code Status:  Full code   Chief Complaint  Patient presents with  . Medical Management of Chronic Issues  . Abnormal Lab    TSH 0.11 (low) on synthroid 125mcg daily    HPI:  80 yo white female with h/o longstanding bipolar depression, hypothyroidism, respiratory failure with tracheostomy placement, dementia, and chronic pain seen for medical mgt of her chronic diseases.  Her TSH was found to be low at 0.11 on current dose.  Is apparently doing better lately.  She has gained weight over the past 4 mos.  She is eating a concho mechanical soft diet with thin liquids.    When seen, she c/o leg pain bilaterally.  She is already on baclofen 20mg  po tid, valium, mobic for pain.  She was previously on oxycodone, but cannot tolerate high doses of narcotics due to her COPD and psychiatric medications.  Review of Systems:  Review of Systems  Constitutional: Negative for malaise/fatigue.  HENT: Negative for congestion.   Eyes: Negative for blurred vision.  Cardiovascular: Negative for chest pain.  Gastrointestinal: Negative for constipation.  Genitourinary: Negative for dysuria.  Musculoskeletal: Positive for joint pain and myalgias. Negative for falls.  Skin: Negative for rash.  Neurological: Positive for weakness. Negative for dizziness and headaches.  Endo/Heme/Allergies: Bruises/bleeds easily.  Psychiatric/Behavioral: Positive for depression and memory loss.    Medications: Patient's Medications  New Prescriptions   LEVOTHYROXINE (SYNTHROID, LEVOTHROID) 112 MCG TABLET    Take 1 tablet (112 mcg total) by mouth daily.  Previous Medications   ACETAMINOPHEN (TYLENOL) 500 MG TABLET    Take 500 mg by mouth 3 (three) times daily.   BACLOFEN (LIORESAL) 20 MG TABLET    Take 20 mg by mouth 3 (three) times daily.   DIAZEPAM (VALIUM) 5 MG  TABLET    Take 5 mg by mouth at bedtime.   DONEPEZIL (ARICEPT) 10 MG TABLET    Take 10 mg by mouth at bedtime.   DULOXETINE (CYMBALTA) 60 MG CAPSULE    Take 60 mg by mouth daily.   FUROSEMIDE (LASIX) 20 MG TABLET    Take 20 mg by mouth 2 (two) times daily.   MELOXICAM (MOBIC) 7.5 MG TABLET    Take 7.5 mg by mouth daily.   MEMANTINE HCL ER (NAMENDA XR) 28 MG CP24    Take 28 mg by mouth daily.   PANTOPRAZOLE (PROTONIX) 40 MG TABLET    Take 40 mg by mouth daily.   POTASSIUM CHLORIDE SA (K-DUR,KLOR-CON) 20 MEQ TABLET    Take 20 mEq by mouth daily.   PSYLLIUM HUSK POWD    Take 5 mLs by mouth 2 (two) times daily.   QUETIAPINE (SEROQUEL) 50 MG TABLET    Take 50 mg by mouth at bedtime.   VALPROATE SODIUM (VALPROIC ACID) 250 MG/5ML SOLN    Take 5 mLs by mouth 2 (two) times daily.  Modified Medications   No medications on file  Discontinued Medications   DIVALPROEX (DEPAKOTE) 250 MG DR TABLET    Take 250 mg by mouth 2 (two) times daily.   LEVOTHYROXINE (SYNTHROID, LEVOTHROID) 125 MCG TABLET    Take 125 mcg by mouth daily before breakfast.   LORAZEPAM (ATIVAN) 0.5 MG TABLET    Take 0.25 mg by mouth every 12 (twelve) hours as needed. 1/2 by mouth every 8  hours as needed for anxiety   NUTRITIONAL SUPPLEMENTS (JEVITY) LIQD    by Other route. 19ml/hour from 7pm to 7am.   OMEPRAZOLE (PRILOSEC) 40 MG CAPSULE    Take 40 mg by mouth daily.   OSELTAMIVIR (TAMIFLU) 75 MG CAPSULE    Take 75 mg by mouth daily. For 14 days for prophylaxis.   OXYCODONE (OXY IR/ROXICODONE) 5 MG IMMEDIATE RELEASE TABLET    Take 5 mg by mouth every 8 (eight) hours.   POLLEN EXTRACTS (PROSTAT PO)    Take 1 packet by mouth 3 (three) times daily.   VITAMIN C (ASCORBIC ACID) 500 MG TABLET    Take 500 mg by mouth 2 (two) times daily.   ZINC SULFATE 220 MG CAPSULE    Take 220 mg by mouth daily.    Physical Exam: Filed Vitals:   05/21/14 1411  BP: 127/77  Pulse: 88  Temp: 97.6 F (36.4 C)  Resp: 18  Height: 5\' 9"  (1.753 m)  Weight:  145 lb (65.772 kg)  SpO2: 96%   Physical Exam  Constitutional: No distress.  Resting in bed--whole body appears stiff  HENT:  Head: Normocephalic and atraumatic.  Cardiovascular: Normal rate, regular rhythm, normal heart sounds and intact distal pulses.   Pulmonary/Chest: Effort normal and breath sounds normal. She has no wheezes. She has no rales.  Abdominal: Soft. Bowel sounds are normal. She exhibits no distension and no mass. There is no tenderness.  Musculoskeletal: She exhibits no tenderness.  Neurological: She is alert.  Oriented x 2  Skin: Skin is warm and dry. There is pallor.  Psychiatric:  Flat affect     Labs reviewed: Basic Metabolic Panel:  Recent Labs  16/10/96 1530  NA 138  K 4.6  CL 97  CO2 30  GLUCOSE 94  BUN 40*  CREATININE 0.67  CALCIUM 9.3    Liver Function Tests:  Recent Labs  11/28/13 1530  AST 13  ALT 10  ALKPHOS 98  BILITOT <0.2*  PROT 6.1  ALBUMIN 2.5*    CBC:  Recent Labs  11/28/13 1530  WBC 7.7  NEUTROABS 4.8  HGB 12.3  HCT 36.8  MCV 98.9  PLT 188   No basic labs in SNF chart since 1 year ago  Significant Diagnostic Results:  CXR portable 04/22/14:  No active cardiopulmonary disease suspected.  Consider possible short term repeat cxr clinically as pt rotated substantially.    Assessment/Plan 1. Hypothyroidism due to acquired atrophy of thyroid - adjust dose to due to low tsh - levothyroxine (SYNTHROID, LEVOTHROID) 112 MCG tablet; Take 1 tablet (112 mcg total) by mouth daily.  Dispense: 90 tablet; Refill: 3  2. Bipolar disorder, in partial remission, most recent episode depressed -followed by [facility] -continues on depakote, cymbalta, and seroquel for this -seems mood has been better lately  3. Pulmonary emphysema, unspecified emphysema type -high risk for recurrent respiratory failure so try to avoid narcotics and benzos that could worsen this -no recent copd exacerbations  4. Chronic pain -still c/o pain  in bilateral legs despite 2 muscle relaxants and mobic use -monitor renal function considering mobic use  5. Dementia, without behavioral disturbance -continues on aricept and namenda XR -stable  6. Gastroesophageal reflux disease with esophagitis -no recent complaints, but had been having pharyngitis for quite a while -cont protonix  Family/ staff Communication: discussed with nursing staff  Goals of care: full code, long term care resident  Labs/tests ordered:  TSH, cbc, cmp, depakote level  in 6  wks

## 2014-06-07 ENCOUNTER — Inpatient Hospital Stay (HOSPITAL_COMMUNITY)
Admission: EM | Admit: 2014-06-07 | Discharge: 2014-06-11 | DRG: 871 | Disposition: A | Discharge status: Other Acute Inpatient Hospital | Payer: Medicare Other | Attending: Internal Medicine | Admitting: Internal Medicine

## 2014-06-07 ENCOUNTER — Other Ambulatory Visit: Payer: Self-pay

## 2014-06-07 ENCOUNTER — Inpatient Hospital Stay (HOSPITAL_COMMUNITY): Payer: Medicare Other

## 2014-06-07 ENCOUNTER — Emergency Department (HOSPITAL_COMMUNITY): Payer: Medicare Other

## 2014-06-07 ENCOUNTER — Encounter (HOSPITAL_COMMUNITY): Payer: Self-pay | Admitting: Emergency Medicine

## 2014-06-07 DIAGNOSIS — N179 Acute kidney failure, unspecified: Secondary | ICD-10-CM | POA: Diagnosis present

## 2014-06-07 DIAGNOSIS — R1310 Dysphagia, unspecified: Secondary | ICD-10-CM | POA: Diagnosis present

## 2014-06-07 DIAGNOSIS — IMO0002 Reserved for concepts with insufficient information to code with codable children: Secondary | ICD-10-CM | POA: Diagnosis not present

## 2014-06-07 DIAGNOSIS — F319 Bipolar disorder, unspecified: Secondary | ICD-10-CM | POA: Diagnosis present

## 2014-06-07 DIAGNOSIS — J9620 Acute and chronic respiratory failure, unspecified whether with hypoxia or hypercapnia: Secondary | ICD-10-CM

## 2014-06-07 DIAGNOSIS — I10 Essential (primary) hypertension: Secondary | ICD-10-CM | POA: Diagnosis present

## 2014-06-07 DIAGNOSIS — Z931 Gastrostomy status: Secondary | ICD-10-CM

## 2014-06-07 DIAGNOSIS — J9600 Acute respiratory failure, unspecified whether with hypoxia or hypercapnia: Secondary | ICD-10-CM | POA: Diagnosis present

## 2014-06-07 DIAGNOSIS — I509 Heart failure, unspecified: Secondary | ICD-10-CM | POA: Diagnosis present

## 2014-06-07 DIAGNOSIS — E039 Hypothyroidism, unspecified: Secondary | ICD-10-CM | POA: Diagnosis present

## 2014-06-07 DIAGNOSIS — E870 Hyperosmolality and hypernatremia: Secondary | ICD-10-CM | POA: Diagnosis not present

## 2014-06-07 DIAGNOSIS — J449 Chronic obstructive pulmonary disease, unspecified: Secondary | ICD-10-CM | POA: Diagnosis present

## 2014-06-07 DIAGNOSIS — H543 Unqualified visual loss, both eyes: Secondary | ICD-10-CM | POA: Diagnosis present

## 2014-06-07 DIAGNOSIS — R5381 Other malaise: Secondary | ICD-10-CM | POA: Diagnosis present

## 2014-06-07 DIAGNOSIS — G9340 Encephalopathy, unspecified: Secondary | ICD-10-CM

## 2014-06-07 DIAGNOSIS — I4891 Unspecified atrial fibrillation: Secondary | ICD-10-CM | POA: Diagnosis present

## 2014-06-07 DIAGNOSIS — I5042 Chronic combined systolic (congestive) and diastolic (congestive) heart failure: Secondary | ICD-10-CM | POA: Diagnosis present

## 2014-06-07 DIAGNOSIS — R6520 Severe sepsis without septic shock: Secondary | ICD-10-CM

## 2014-06-07 DIAGNOSIS — Z87891 Personal history of nicotine dependence: Secondary | ICD-10-CM

## 2014-06-07 DIAGNOSIS — F411 Generalized anxiety disorder: Secondary | ICD-10-CM | POA: Diagnosis present

## 2014-06-07 DIAGNOSIS — J4489 Other specified chronic obstructive pulmonary disease: Secondary | ICD-10-CM | POA: Diagnosis present

## 2014-06-07 DIAGNOSIS — Z9181 History of falling: Secondary | ICD-10-CM

## 2014-06-07 DIAGNOSIS — R6521 Severe sepsis with septic shock: Secondary | ICD-10-CM

## 2014-06-07 DIAGNOSIS — J9601 Acute respiratory failure with hypoxia: Secondary | ICD-10-CM

## 2014-06-07 DIAGNOSIS — F0390 Unspecified dementia without behavioral disturbance: Secondary | ICD-10-CM | POA: Diagnosis present

## 2014-06-07 DIAGNOSIS — A419 Sepsis, unspecified organism: Secondary | ICD-10-CM | POA: Diagnosis present

## 2014-06-07 DIAGNOSIS — J189 Pneumonia, unspecified organism: Secondary | ICD-10-CM

## 2014-06-07 DIAGNOSIS — K219 Gastro-esophageal reflux disease without esophagitis: Secondary | ICD-10-CM | POA: Diagnosis present

## 2014-06-07 DIAGNOSIS — Z6825 Body mass index (BMI) 25.0-25.9, adult: Secondary | ICD-10-CM | POA: Diagnosis not present

## 2014-06-07 DIAGNOSIS — F068 Other specified mental disorders due to known physiological condition: Secondary | ICD-10-CM

## 2014-06-07 DIAGNOSIS — Z515 Encounter for palliative care: Secondary | ICD-10-CM

## 2014-06-07 DIAGNOSIS — R627 Adult failure to thrive: Secondary | ICD-10-CM | POA: Diagnosis present

## 2014-06-07 DIAGNOSIS — R4182 Altered mental status, unspecified: Secondary | ICD-10-CM | POA: Diagnosis present

## 2014-06-07 DIAGNOSIS — G8929 Other chronic pain: Secondary | ICD-10-CM | POA: Diagnosis present

## 2014-06-07 DIAGNOSIS — N390 Urinary tract infection, site not specified: Secondary | ICD-10-CM | POA: Diagnosis present

## 2014-06-07 DIAGNOSIS — J438 Other emphysema: Secondary | ICD-10-CM

## 2014-06-07 DIAGNOSIS — A498 Other bacterial infections of unspecified site: Secondary | ICD-10-CM | POA: Diagnosis present

## 2014-06-07 LAB — CARBOXYHEMOGLOBIN
Carboxyhemoglobin: 1.3 % (ref 0.5–1.5)
METHEMOGLOBIN: 0.9 % (ref 0.0–1.5)
O2 Saturation: 82.1 %
TOTAL HEMOGLOBIN: 11.6 g/dL — AB (ref 12.0–16.0)

## 2014-06-07 LAB — BLOOD GAS, ARTERIAL
Acid-base deficit: 6.2 mmol/L — ABNORMAL HIGH (ref 0.0–2.0)
BICARBONATE: 19.6 meq/L — AB (ref 20.0–24.0)
DRAWN BY: 41308
FIO2: 100 %
MECHVT: 500 mL
O2 Saturation: 97.3 %
PCO2 ART: 44.9 mmHg (ref 35.0–45.0)
PEEP: 5 cmH2O
Patient temperature: 98.6
RATE: 14 resp/min
TCO2: 21 mmol/L (ref 0–100)
pH, Arterial: 7.263 — ABNORMAL LOW (ref 7.350–7.450)
pO2, Arterial: 109 mmHg — ABNORMAL HIGH (ref 80.0–100.0)

## 2014-06-07 LAB — URINALYSIS, ROUTINE W REFLEX MICROSCOPIC
Bilirubin Urine: NEGATIVE
GLUCOSE, UA: NEGATIVE mg/dL
HGB URINE DIPSTICK: NEGATIVE
Ketones, ur: NEGATIVE mg/dL
Nitrite: NEGATIVE
PH: 5 (ref 5.0–8.0)
PROTEIN: NEGATIVE mg/dL
Specific Gravity, Urine: 1.013 (ref 1.005–1.030)
Urobilinogen, UA: 0.2 mg/dL (ref 0.0–1.0)

## 2014-06-07 LAB — CBC WITH DIFFERENTIAL/PLATELET
BASOS PCT: 0 % (ref 0–1)
Basophils Absolute: 0 10*3/uL (ref 0.0–0.1)
EOS ABS: 0.1 10*3/uL (ref 0.0–0.7)
EOS PCT: 0 % (ref 0–5)
HCT: 36.6 % (ref 36.0–46.0)
HEMOGLOBIN: 11.9 g/dL — AB (ref 12.0–15.0)
LYMPHS ABS: 2.1 10*3/uL (ref 0.7–4.0)
Lymphocytes Relative: 17 % (ref 12–46)
MCH: 33.1 pg (ref 26.0–34.0)
MCHC: 32.5 g/dL (ref 30.0–36.0)
MCV: 101.7 fL — AB (ref 78.0–100.0)
MONO ABS: 0.6 10*3/uL (ref 0.1–1.0)
MONOS PCT: 5 % (ref 3–12)
Neutro Abs: 9.3 10*3/uL — ABNORMAL HIGH (ref 1.7–7.7)
Neutrophils Relative %: 78 % — ABNORMAL HIGH (ref 43–77)
Platelets: 289 10*3/uL (ref 150–400)
RBC: 3.6 MIL/uL — AB (ref 3.87–5.11)
RDW: 13.2 % (ref 11.5–15.5)
WBC: 12 10*3/uL — ABNORMAL HIGH (ref 4.0–10.5)

## 2014-06-07 LAB — HEMOGLOBIN A1C
Hgb A1c MFr Bld: 5.6 % (ref <5.7)
Mean Plasma Glucose: 114 mg/dL (ref <117)

## 2014-06-07 LAB — BASIC METABOLIC PANEL
Anion gap: 16 — ABNORMAL HIGH (ref 5–15)
BUN: 64 mg/dL — AB (ref 6–23)
CALCIUM: 8.2 mg/dL — AB (ref 8.4–10.5)
CO2: 20 mEq/L (ref 19–32)
CREATININE: 0.63 mg/dL (ref 0.50–1.10)
Chloride: 107 mEq/L (ref 96–112)
GFR calc non Af Amer: 80 mL/min — ABNORMAL LOW (ref >90)
GLUCOSE: 235 mg/dL — AB (ref 70–99)
Potassium: 3 mEq/L — ABNORMAL LOW (ref 3.7–5.3)
Sodium: 143 mEq/L (ref 137–147)

## 2014-06-07 LAB — GLUCOSE, CAPILLARY
GLUCOSE-CAPILLARY: 202 mg/dL — AB (ref 70–99)
GLUCOSE-CAPILLARY: 149 mg/dL — AB (ref 70–99)
GLUCOSE-CAPILLARY: 90 mg/dL (ref 70–99)
GLUCOSE-CAPILLARY: 97 mg/dL (ref 70–99)
Glucose-Capillary: 127 mg/dL — ABNORMAL HIGH (ref 70–99)

## 2014-06-07 LAB — I-STAT CHEM 8, ED
BUN: 83 mg/dL — ABNORMAL HIGH (ref 6–23)
CALCIUM ION: 1.17 mmol/L (ref 1.13–1.30)
Chloride: 108 mEq/L (ref 96–112)
Creatinine, Ser: 1 mg/dL (ref 0.50–1.10)
GLUCOSE: 190 mg/dL — AB (ref 70–99)
HCT: 39 % (ref 36.0–46.0)
HEMOGLOBIN: 13.3 g/dL (ref 12.0–15.0)
POTASSIUM: 3.9 meq/L (ref 3.7–5.3)
Sodium: 142 mEq/L (ref 137–147)
TCO2: 21 mmol/L (ref 0–100)

## 2014-06-07 LAB — APTT: aPTT: 27 seconds (ref 24–37)

## 2014-06-07 LAB — TRIGLYCERIDES: Triglycerides: 160 mg/dL — ABNORMAL HIGH (ref <150)

## 2014-06-07 LAB — URINE MICROSCOPIC-ADD ON

## 2014-06-07 LAB — CBC
HEMATOCRIT: 34.3 % — AB (ref 36.0–46.0)
HEMOGLOBIN: 11.1 g/dL — AB (ref 12.0–15.0)
MCH: 32.6 pg (ref 26.0–34.0)
MCHC: 32.4 g/dL (ref 30.0–36.0)
MCV: 100.9 fL — AB (ref 78.0–100.0)
Platelets: 291 10*3/uL (ref 150–400)
RBC: 3.4 MIL/uL — ABNORMAL LOW (ref 3.87–5.11)
RDW: 13.1 % (ref 11.5–15.5)
WBC: 13.2 10*3/uL — ABNORMAL HIGH (ref 4.0–10.5)

## 2014-06-07 LAB — MRSA PCR SCREENING: MRSA BY PCR: NEGATIVE

## 2014-06-07 LAB — TROPONIN I: Troponin I: <0.3 ng/mL (ref <0.30)

## 2014-06-07 LAB — MAGNESIUM: MAGNESIUM: 1.7 mg/dL (ref 1.5–2.5)

## 2014-06-07 LAB — I-STAT CG4 LACTIC ACID, ED: Lactic Acid, Venous: 1.37 mmol/L (ref 0.5–2.2)

## 2014-06-07 LAB — PHOSPHORUS: PHOSPHORUS: 3.1 mg/dL (ref 2.3–4.6)

## 2014-06-07 MED ORDER — DONEPEZIL HCL 10 MG PO TABS
10.0000 mg | ORAL_TABLET | Freq: Every day | ORAL | Status: DC
  Filled 2014-06-07: qty 1

## 2014-06-07 MED ORDER — PROPOFOL 10 MG/ML IV EMUL
INTRAVENOUS | Status: AC
  Administered 2014-06-07: 12.665 ug/kg/min via INTRAVENOUS
  Filled 2014-06-07: qty 100

## 2014-06-07 MED ORDER — SUCCINYLCHOLINE CHLORIDE 20 MG/ML IJ SOLN
INTRAMUSCULAR | Status: AC | PRN
  Administered 2014-06-07: 75 mg via INTRAVENOUS

## 2014-06-07 MED ORDER — MEMANTINE HCL 10 MG PO TABS
10.0000 mg | ORAL_TABLET | Freq: Two times a day (BID) | ORAL | Status: DC
  Administered 2014-06-07 – 2014-06-11 (×9): 10 mg via ORAL
  Filled 2014-06-07 (×10): qty 1

## 2014-06-07 MED ORDER — VITAL HIGH PROTEIN PO LIQD
1000.0000 mL | ORAL | Status: DC
  Administered 2014-06-07 – 2014-06-11 (×6): 1000 mL
  Filled 2014-06-07 (×5): qty 1000

## 2014-06-07 MED ORDER — IPRATROPIUM-ALBUTEROL 0.5-2.5 (3) MG/3ML IN SOLN
3.0000 mL | RESPIRATORY_TRACT | Status: DC
  Administered 2014-06-07: 3 mL via RESPIRATORY_TRACT
  Filled 2014-06-07: qty 3

## 2014-06-07 MED ORDER — PRO-STAT SUGAR FREE PO LIQD
30.0000 mL | Freq: Two times a day (BID) | ORAL | Status: DC
  Filled 2014-06-07 (×2): qty 30

## 2014-06-07 MED ORDER — DONEPEZIL HCL 10 MG PO TABS
10.0000 mg | ORAL_TABLET | Freq: Every day | ORAL | Status: DC
  Administered 2014-06-07 – 2014-06-10 (×4): 10 mg
  Filled 2014-06-07 (×5): qty 1

## 2014-06-07 MED ORDER — SODIUM CHLORIDE 0.9 % IV SOLN: 250.0000 mL | INTRAVENOUS | Status: DC | PRN

## 2014-06-07 MED ORDER — VALPROIC ACID 250 MG/5ML PO SYRP
250.0000 mg | ORAL_SOLUTION | Freq: Two times a day (BID) | ORAL | Status: DC
  Administered 2014-06-08 – 2014-06-11 (×7): 250 mg
  Filled 2014-06-07 (×9): qty 5

## 2014-06-07 MED ORDER — SODIUM CHLORIDE 0.9 % IV SOLN
2.0000 ug/min | INTRAVENOUS | Status: DC
  Administered 2014-06-07: 7 ug/min via INTRAVENOUS
  Filled 2014-06-07 (×3): qty 4

## 2014-06-07 MED ORDER — POTASSIUM CHLORIDE 20 MEQ/15ML (10%) PO LIQD
40.0000 meq | Freq: Two times a day (BID) | ORAL | Status: AC
  Administered 2014-06-07 (×2): 40 meq
  Filled 2014-06-07 (×2): qty 30

## 2014-06-07 MED ORDER — FENTANYL CITRATE 0.05 MG/ML IJ SOLN: 50.0000 ug | INTRAMUSCULAR | Status: DC | PRN

## 2014-06-07 MED ORDER — VITAL HIGH PROTEIN PO LIQD
1000.0000 mL | ORAL | Status: DC
  Filled 2014-06-07 (×2): qty 1000

## 2014-06-07 MED ORDER — LEVOTHYROXINE SODIUM 112 MCG PO TABS
112.0000 ug | ORAL_TABLET | Freq: Every day | ORAL | Status: DC
  Administered 2014-06-07: 112 ug via ORAL
  Filled 2014-06-07 (×2): qty 1

## 2014-06-07 MED ORDER — FAMOTIDINE IN NACL 20-0.9 MG/50ML-% IV SOLN
20.0000 mg | Freq: Two times a day (BID) | INTRAVENOUS | Status: DC
  Administered 2014-06-07 – 2014-06-08 (×3): 20 mg via INTRAVENOUS
  Filled 2014-06-07 (×5): qty 50

## 2014-06-07 MED ORDER — POTASSIUM CHLORIDE 2 MEQ/ML IV SOLN
INTRAVENOUS | Status: DC
  Administered 2014-06-07: 17:00:00 via INTRAVENOUS
  Filled 2014-06-07 (×2): qty 1000

## 2014-06-07 MED ORDER — PROPOFOL 10 MG/ML IV EMUL
5.0000 ug/kg/min | Freq: Once | INTRAVENOUS | Status: AC
  Administered 2014-06-07: 12.665 ug/kg/min via INTRAVENOUS

## 2014-06-07 MED ORDER — SODIUM CHLORIDE 0.9 % IV SOLN: 1000.0000 mL | INTRAVENOUS | Status: DC

## 2014-06-07 MED ORDER — FENTANYL CITRATE 0.05 MG/ML IJ SOLN
INTRAMUSCULAR | Status: AC
  Filled 2014-06-07: qty 2

## 2014-06-07 MED ORDER — LEVOTHYROXINE SODIUM 112 MCG PO TABS
112.0000 ug | ORAL_TABLET | Freq: Every day | ORAL | Status: DC
  Administered 2014-06-08 – 2014-06-11 (×4): 112 ug
  Filled 2014-06-07 (×6): qty 1

## 2014-06-07 MED ORDER — BIOTENE DRY MOUTH MT LIQD
15.0000 mL | Freq: Four times a day (QID) | OROMUCOSAL | Status: DC
Start: 2014-06-08 — End: 2014-06-11
  Administered 2014-06-07 – 2014-06-11 (×16): 15 mL via OROMUCOSAL

## 2014-06-07 MED ORDER — FENTANYL CITRATE 0.05 MG/ML IJ SOLN
25.0000 ug | INTRAMUSCULAR | Status: DC | PRN
  Administered 2014-06-09 (×2): 25 ug via INTRAVENOUS
  Filled 2014-06-07 (×2): qty 2

## 2014-06-07 MED ORDER — VALPROIC ACID 250 MG/5ML PO SYRP
250.0000 mg | ORAL_SOLUTION | Freq: Two times a day (BID) | ORAL | Status: DC
  Filled 2014-06-07 (×2): qty 5

## 2014-06-07 MED ORDER — DEXTROSE 5 % IV SOLN
1.0000 g | Freq: Two times a day (BID) | INTRAVENOUS | Status: DC
  Administered 2014-06-07: 1 g via INTRAVENOUS
  Filled 2014-06-07 (×2): qty 1

## 2014-06-07 MED ORDER — SODIUM CHLORIDE 0.9 % IV BOLUS (SEPSIS)
2000.0000 mL | Freq: Once | INTRAVENOUS | Status: AC
  Administered 2014-06-07: 2000 mL via INTRAVENOUS

## 2014-06-07 MED ORDER — PIPERACILLIN-TAZOBACTAM 3.375 G IVPB
3.3750 g | Freq: Three times a day (TID) | INTRAVENOUS | Status: DC
  Administered 2014-06-07 – 2014-06-09 (×7): 3.375 g via INTRAVENOUS
  Filled 2014-06-07 (×9): qty 50

## 2014-06-07 MED ORDER — PRO-STAT SUGAR FREE PO LIQD
30.0000 mL | ORAL | Status: DC
  Administered 2014-06-07 – 2014-06-11 (×5): 30 mL
  Filled 2014-06-07 (×5): qty 30

## 2014-06-07 MED ORDER — IPRATROPIUM-ALBUTEROL 0.5-2.5 (3) MG/3ML IN SOLN
3.0000 mL | Freq: Four times a day (QID) | RESPIRATORY_TRACT | Status: DC
  Administered 2014-06-07 – 2014-06-11 (×16): 3 mL via RESPIRATORY_TRACT
  Filled 2014-06-07 (×16): qty 3

## 2014-06-07 MED ORDER — CHLORHEXIDINE GLUCONATE 0.12 % MT SOLN
15.0000 mL | Freq: Two times a day (BID) | OROMUCOSAL | Status: DC
  Administered 2014-06-08 – 2014-06-11 (×7): 15 mL via OROMUCOSAL
  Filled 2014-06-07 (×7): qty 15

## 2014-06-07 MED ORDER — MEMANTINE HCL ER 28 MG PO CP24
28.0000 mg | ORAL_CAPSULE | Freq: Every day | ORAL | Status: DC
  Filled 2014-06-07: qty 28

## 2014-06-07 MED ORDER — PROPOFOL 10 MG/ML IV EMUL
0.0000 ug/kg/min | INTRAVENOUS | Status: DC
  Administered 2014-06-07: 30 ug/kg/min via INTRAVENOUS
  Administered 2014-06-08: 15 ug/kg/min via INTRAVENOUS
  Filled 2014-06-07: qty 1000
  Filled 2014-06-07 (×3): qty 100

## 2014-06-07 MED ORDER — HEPARIN SODIUM (PORCINE) 5000 UNIT/ML IJ SOLN
5000.0000 [IU] | Freq: Three times a day (TID) | INTRAMUSCULAR | Status: DC
Start: 2014-06-07 — End: 2014-06-11
  Administered 2014-06-07 – 2014-06-11 (×13): 5000 [IU] via SUBCUTANEOUS
  Filled 2014-06-07 (×13): qty 1

## 2014-06-07 MED ORDER — VANCOMYCIN HCL IN DEXTROSE 1-5 GM/200ML-% IV SOLN
1000.0000 mg | Freq: Once | INTRAVENOUS | Status: AC
  Administered 2014-06-07: 1000 mg via INTRAVENOUS
  Filled 2014-06-07: qty 200

## 2014-06-07 MED ORDER — INSULIN ASPART 100 UNIT/ML ~~LOC~~ SOLN
0.0000 [IU] | SUBCUTANEOUS | Status: DC
  Administered 2014-06-07 – 2014-06-11 (×2): 2 [IU] via SUBCUTANEOUS

## 2014-06-07 MED ORDER — NOREPINEPHRINE BITARTRATE 1 MG/ML IV SOLN
2.0000 ug/min | Freq: Once | INTRAVENOUS | Status: AC
  Administered 2014-06-07: 10 ug/min via INTRAVENOUS

## 2014-06-07 MED ORDER — VANCOMYCIN HCL IN DEXTROSE 1-5 GM/200ML-% IV SOLN
1000.0000 mg | INTRAVENOUS | Status: DC
  Administered 2014-06-08 – 2014-06-10 (×3): 1000 mg via INTRAVENOUS
  Filled 2014-06-07 (×3): qty 200

## 2014-06-07 MED ORDER — ETOMIDATE 2 MG/ML IV SOLN
INTRAVENOUS | Status: AC | PRN
  Administered 2014-06-07: 8 mg via INTRAVENOUS
  Administered 2014-06-07: 15 mg via INTRAVENOUS

## 2014-06-07 NOTE — Progress Notes (Signed)
ANTIBIOTIC CONSULT NOTE - INITIAL  Pharmacy Consult for Vancomycin/Zosyn  Indication: pneumonia  No Known Allergies  Patient Measurements: Height: 5\' 4"  (162.6 cm) Weight: 143 lb 15.4 oz (65.3 kg) IBW/kg (Calculated) : 54.7  Vital Signs: Temp: 94.1 F (34.5 C) (07/25 0400) BP: 107/28 mmHg (07/25 0400) Pulse Rate: 76 (07/25 0500)  Labs:  Recent Labs  06/07/14 0055 06/07/14 0101  WBC 12.0*  --   HGB 11.9* 13.3  PLT 289  --   CREATININE  --  1.00    Medical History: Past Medical History  Diagnosis Date  . COPD (chronic obstructive pulmonary disease)   . Hypertension   . Blind   . Hypothyroid   . Pacemaker   . Renal insufficiency   . Finger fracture   . Dementia   . Bipolar disorder   . Dysphagia     needs thick nectar liquids  . Atrial fibrillation   . Pneumonia   . Anxiety   . Diastolic heart failure    Assessment: 80 y/o F from NH with likely PNA. Mild leukocytosis, renal function appropriate for age, other labs as above.   Goal of Therapy:  Vancomycin trough level 15-20 mcg/ml  Plan:  -Vancomycin 1000 mg IV q24h -Zosyn 3.375G IV q8h to be infused over 4 hours -Trend WBC, temp, renal function  -Drug levels as indicated   Abran DukeLedford, Dreana Britz 06/07/2014,5:34 AM

## 2014-06-07 NOTE — Progress Notes (Addendum)
INITIAL NUTRITION ASSESSMENT  DOCUMENTATION CODES Per approved criteria  -Not Applicable   INTERVENTION: Utilize 63M PEPuP Protocol- Initiate TF via PEG with Vital High Protein at 25 ml/h and Prostat 30 ml once daily on day 1; on day 2, increase to goal rate of 35 ml/h (840 ml per day) to provide 940 kcals, 88 gm protein, 702 ml free water daily. (TF plus current rate of propofol will provide 1154 kcal)  When Propofol is discontinued, provide Vital High Protein at new goal of 50 ml/hr (and discontinue pro-stat) to provide 1200 kcal, 105 grams of protein, and 1003 ml of water.     NUTRITION DIAGNOSIS: Inadequate oral intake related to inability to eat as evidenced by NPO status.   Goal: Pt to meet >/= 90% of their estimated nutrition needs   Monitor:  Vent status, TF initiation/tolerance, weight trend, labs  Reason for Assessment: Consult for TF management  80 y.o. female  Admitting Dx: Septic shock  ASSESSMENT: 80yo w/ h/o COPD, dementia, CHF PEG dependent who was apparently diagnosed with pneumonia yesterday. Tonight she was found down with respiratory distress and had O2 sats 72%. She was given narcan, after which she was somewhat responsive and showed better respiratory drive. On arrival she was hypotensive and in respiratory distress. She was intubated and started on propofol, after which her pressures dropped further and levophed was started.  Pt appears well-nourished. Per paper chart, pt receives water flushes and medications via G-tube. She also receives a frozen treat BID with meals. Per weight history, pt's weight is fairly stable.  Labs: elevated glucose, high BUN, low calcium, low potassium  Patient is currently intubated on ventilator support MV: 7 L/min Temp (24hrs), Avg:95.4 F (35.2 C), Min:94.1 F (34.5 C), Max:98.2 F (36.8 C)  Propofol: 11.9 ml/hr (Provides 314 kcal per 24 hours)   Height: Ht Readings from Last 1 Encounters:  06/07/14 5\' 4"  (1.626 m)     Weight: Wt Readings from Last 1 Encounters:  06/07/14 143 lb 15.4 oz (65.3 kg)    Ideal Body Weight: 120 lb  % Ideal Body Weight: 119%  Wt Readings from Last 10 Encounters:  06/07/14 143 lb 15.4 oz (65.3 kg)  05/21/14 145 lb (65.772 kg)  05/28/13 129 lb (58.514 kg)  05/21/13 129 lb (58.514 kg)  04/03/13 139 lb (63.05 kg)  02/28/13 139 lb 5.3 oz (63.2 kg)  02/15/13 148 lb (67.132 kg)  08/16/12 165 lb (74.844 kg)    Usual Body Weight: unknown  % Usual Body Weight: NA  BMI:  Body mass index is 24.7 kg/(m^2).  Estimated Nutritional Needs: Kcal: 1168 Protein: 90-100 grams Fluid: 1.9 L/day  Skin: open wound/incision on head; +1generalized edema  Diet Order: NPO  EDUCATION NEEDS: -No education needs identified at this time   Intake/Output Summary (Last 24 hours) at 06/07/14 0923 Last data filed at 06/07/14 0700  Gross per 24 hour  Intake   2050 ml  Output    300 ml  Net   1750 ml    Last BM: 7/25   Labs:   Recent Labs Lab 06/07/14 0101 06/07/14 0614  NA 142 143  K 3.9 3.0*  CL 108 107  CO2  --  20  BUN 83* 64*  CREATININE 1.00 0.63  CALCIUM  --  8.2*  GLUCOSE 190* 235*    CBG (last 3)   Recent Labs  06/07/14 0454 06/07/14 0817  GLUCAP 202* 149*    Scheduled Meds: . donepezil  10 mg  Oral QHS  . famotidine (PEPCID) IV  20 mg Intravenous Q12H  . fentaNYL      . heparin  5,000 Units Subcutaneous 3 times per day  . ipratropium-albuterol  3 mL Nebulization Q4H  . levothyroxine  112 mcg Oral QAC breakfast  . Memantine HCl ER  28 mg Oral Daily  . piperacillin-tazobactam (ZOSYN)  IV  3.375 g Intravenous 3 times per day  . Valproic Acid  250 mg Oral BID  . [START ON 06/08/2014] vancomycin  1,000 mg Intravenous Q24H    Continuous Infusions: . sodium chloride    . norepinephrine (LEVOPHED) Adult infusion 7 mcg/min (06/07/14 0901)  . propofol 30 mcg/kg/min (06/07/14 0901)    Past Medical History  Diagnosis Date  . COPD (chronic  obstructive pulmonary disease)   . Hypertension   . Blind   . Hypothyroid   . Pacemaker   . Renal insufficiency   . Finger fracture   . Dementia   . Bipolar disorder   . Dysphagia     needs thick nectar liquids  . Atrial fibrillation   . Pneumonia   . Anxiety   . Diastolic heart failure     Past Surgical History  Procedure Laterality Date  . Insert / replace / remove pacemaker    . Tracheostomy  02/27/13    decannulated 03/29/13  . Gastrostomy tube placement  03/11/13    Ian Malkineanne Barnett RD, LDN Inpatient Clinical Dietitian Pager: [phone] After Hours Pager: [phone]

## 2014-06-07 NOTE — ED Notes (Signed)
[facility]daughter)  She stated that her and her brother are aware of their Mother being here.

## 2014-06-07 NOTE — Procedures (Signed)
Arterial Catheter Insertion Procedure Note [facility] [id number] 12/30/1927  Procedure: Insertion of Arterial Catheter  Indications: Blood pressure monitoring  Procedure Details Consent: Unable to obtain consent because of altered level of consciousness. Time Out: Verified patient identification, verified procedure, site/side was marked, verified correct patient position, special equipment/implants available, medications/allergies/relevent history reviewed, required imaging and test results available.  Performed  Maximum sterile technique was used including antiseptics, cap, gloves, gown, hand hygiene, mask and sheet. Skin prep: Chlorhexidine; local anesthetic administered 20 gauge catheter was inserted into right radial artery using the Seldinger technique.  Evaluation Blood flow good; BP tracing good. Complications: No apparent complications.   Fredrich BirksMarshburn, Aarilyn Dye Lynne 06/07/2014

## 2014-06-07 NOTE — ED Notes (Signed)
[facility], patient diagnosed with pneumonia yesterday and tonight staff found patient in room unresponsive and sats in the 60's. Pupils pinpoint. EMS gave 2mg  of Narcan and patient became responsive to pain and respiratory drive increased. On truck patient began to to desat and respiratory drive began to decrease. EMS gave 2 more mg of Narcan and there has been no change. EMS began to bag patient and sats are 93 on BVM. Hx of heart failure.

## 2014-06-07 NOTE — ED Notes (Signed)
Md aware of pressure. RN to obtain order for Norepi

## 2014-06-07 NOTE — ED Notes (Signed)
Spoke with patients son regarding condition.  Explained to him that we were inserting a central line for further IV meds.  Voiced understanding

## 2014-06-07 NOTE — ED Notes (Addendum)
[facility] [phone]) and they state she has been at that facility since 2012.  Tonight they noticed her breathing was worse and checked sats to be 72%.  Placed on oxygen, Avelox 400mg , Lasix 20mg  and albuterol given.  Patient is not a DNR

## 2014-06-07 NOTE — Sedation Documentation (Signed)
Bagging patient

## 2014-06-07 NOTE — H&P (Signed)
PULMONARY / CRITICAL CARE MEDICINE   Name: [name] MRN: [id number] DOB: 04-29-1928    ADMISSION DATE:  06/07/2014 CONSULTATION DATE:  06/07/2014  REFERRING MD: EDP  CHIEF COMPLAINT:  Found down at nursing home, hypoxic.   STUDIES:  CXR:  1. Endotracheal tube seen ending 3-4 cm above the carina.  2. Vague right mid lung airspace opacity may reflect pneumonia.   SIGNIFICANT EVENTS: 7/24 - dx with pneumonia, started on avelox 7/25 - found down, hypoxic, worsening respiratory distress 7/25 - admitted to ICU for respiratory failue, placed on pressors  CULTURES: Blood x2 7/25>>> Resp 7/25>>>  ANTIBIOTICS: Avelox 7/24>>>7/24 Vanc 7/25>>> Cefepime 7/25>>>  LINES: ETT 7/25>>> CVL 7/25>>> PEG (chronic)>>>  HISTORY OF PRESENT ILLNESS: Pt is 80yo w/ h/o COPD, dementia, CHF PEG dependent who was apparently diagnosed with pneumonia yesterday. Tonight she was found down with respiratory distress and had O2 sats 72%. She was given narcan, after which she was somewhat responsive and showed better respiratory drive. On arrival she was hypotensive and in respiratory distress. She was intubated and started on propofol, after which her pressures dropped further and levophed was started.  PAST MEDICAL HISTORY :  Past Medical History  Diagnosis Date  . COPD (chronic obstructive pulmonary disease)   . Hypertension   . Blind   . Hypothyroid   . Pacemaker   . Renal insufficiency   . Finger fracture   . Dementia   . Bipolar disorder   . Dysphagia     needs thick nectar liquids  . Atrial fibrillation   . Pneumonia   . Anxiety   . Diastolic heart failure    Past Surgical History  Procedure Laterality Date  . Insert / replace / remove pacemaker    . Tracheostomy  02/27/13    decannulated 03/29/13  . Gastrostomy tube placement  03/11/13   Prior to Admission medications   Medication Sig Start Date End Date Taking? Authorizing Provider  acetaminophen (TYLENOL) 500 MG tablet Take  500 mg by mouth 3 (three) times daily.   Yes Historical Provider, MD  baclofen (LIORESAL) 20 MG tablet Take 20 mg by mouth 3 (three) times daily.   Yes Historical Provider, MD  diazepam (VALIUM) 5 MG tablet Take 5 mg by mouth at bedtime.   Yes Historical Provider, MD  donepezil (ARICEPT) 10 MG tablet Take 10 mg by mouth at bedtime.   Yes Historical Provider, MD  DULoxetine (CYMBALTA) 60 MG capsule Take 60 mg by mouth daily.   Yes Historical Provider, MD  furosemide (LASIX) 20 MG tablet Take 20 mg by mouth 2 (two) times daily.   Yes Historical Provider, MD  levothyroxine (SYNTHROID, LEVOTHROID) 112 MCG tablet Take 1 tablet (112 mcg total) by mouth daily. 05/21/14  Yes [name] L [name], DO  meloxicam (MOBIC) 7.5 MG tablet Take 7.5 mg by mouth daily.   Yes Historical Provider, MD  Memantine HCl ER (NAMENDA XR) 28 MG CP24 Take 28 mg by mouth daily.   Yes Historical Provider, MD  moxifloxacin (AVELOX) 400 MG tablet Take 400 mg by mouth daily at 8 pm. For 7 days   Yes Historical Provider, MD  Multiple Vitamins-Minerals (MULTIVITAMIN WITH MINERALS) tablet Take 1 tablet by mouth daily.   Yes Historical Provider, MD  pantoprazole sodium (PROTONIX) 40 mg/20 mL PACK Take 40 mg by mouth daily.   Yes Historical Provider, MD  potassium chloride 20 MEQ/15ML (10%) solution Take 20 mEq by mouth daily.   Yes Historical Provider, MD  Psyllium  Husk POWD Take 5 mLs by mouth 2 (two) times daily.   Yes Historical Provider, MD  QUEtiapine (SEROQUEL) 50 MG tablet Take 50 mg by mouth at bedtime.   Yes Historical Provider, MD  Valproate Sodium (VALPROIC ACID) 250 MG/5ML SOLN Take 5 mLs by mouth 2 (two) times daily.   Yes Historical Provider, MD  VITAMIN D, CHOLECALCIFEROL, PO Take 2,000 mg by mouth daily. Liquid   Yes Historical Provider, MD   No Known Allergies  FAMILY HISTORY:  No family history on file. SOCIAL HISTORY:  reports that she has quit smoking. Her smoking use included Cigarettes. She smoked 0.00 packs per day  for .5 years. She does not have any smokeless tobacco history on file. She reports that she does not use illicit drugs. Her alcohol history is not on file.  REVIEW OF SYSTEMS: Patient unresponsive, unable to perform  SUBJECTIVE:   VITAL SIGNS: Pulse Rate:  [78-95] 78 (07/25 0119) Resp:  [17-22] 21 (07/25 0119) BP: (59-112)/(23-79) 59/23 mmHg (07/25 0132) SpO2:  [85 %-98 %] 96 % (07/25 0119) FiO2 (%):  [60 %] 60 % (07/25 0110) Weight:  [145 lb 1 oz (65.8 kg)] 145 lb 1 oz (65.8 kg) (07/25 0124) HEMODYNAMICS:   VENTILATOR SETTINGS: Vent Mode:  [-] PRVC FiO2 (%):  [60 %] 60 % Set Rate:  [14 bmp] 14 bmp Vt Set:  [500 mL] 500 mL PEEP:  [5 cmH20] 5 cmH20 Plateau Pressure:  [18 cmH20] 18 cmH20 INTAKE / OUTPUT: No intake or output data in the 24 hours ending 06/07/14 0304  PHYSICAL EXAMINATION: General: elderly female, sedated and intubated, unresponsive Neuro:  sedated HEENT:  MMM, NCAT Cardiovascular:  RRR, no murmur Lungs:  Good air movement, scattered exp wheezes Abdomen:  Soft, NTND Musculoskeletal: Warm lower extremities Skin: mottled upper extremities initially, improved on later exams  LABS:  CBC  Recent Labs Lab 06/07/14 0055 06/07/14 0101  WBC 12.0*  --   HGB 11.9* 13.3  HCT 36.6 39.0  PLT 289  --    Coag's  Recent Labs Lab 06/07/14 0055  APTT 27   BMET  Recent Labs Lab 06/07/14 0101  NA 142  K 3.9  CL 108  BUN 83*  CREATININE 1.00  GLUCOSE 190*   Electrolytes No results found for this basename: CALCIUM, MG, PHOS,  in the last 168 hours Sepsis Markers  Recent Labs Lab 06/07/14 0102  LATICACIDVEN 1.37   ABG No results found for this basename: PHART, PCO2ART, PO2ART,  in the last 168 hours Liver Enzymes No results found for this basename: AST, ALT, ALKPHOS, BILITOT, ALBUMIN,  in the last 168 hours Cardiac Enzymes  Recent Labs Lab 06/07/14 0055  TROPONINI <0.30   Glucose No results found for this basename: GLUCAP,  in the last 168  hours  Imaging CXR notable for pacemaker in place.  Probable RML infiltrate  ASSESSMENT / PLAN:  PULMONARY A: Acute respiratory failure COPD Possible HCAP P:   Vent Albuterol PRN See ID  CARDIOVASCULAR A: Septic shock CHF, unclear baseline EF P:  Levophed Hold home lasix Will check central venous once line place Consider echo   RENAL A: BUN/creat 2x baseline P:   S/p 3L bolus, will cont prn  AM BMET Correct electrolytes PRN  GASTROINTESTINAL A:  PEG dependent GERD P:   SUP: PPI NPO Nutrition c/s for TF recs  HEMATOLOGIC A:  No Acute issues P:  AM CBC  INFECTIOUS A:  Possible HCAP Septic shock (hypothermia, elevated WBC, need  for pressors) 2/2 pulm source P:   Vanc/zosyn Follow blood and resp cultures  ENDOCRINE A:  Hypothyroidism No h/o diabetes   P:   Continue synthroid - IV equivalent dose 56mcg Monitor glucose on BMET, add CBG/SSI if >200  NEUROLOGIC A: Bipolar Dementia Chronic pain Sedation P:   Propofol GTT Cont home dementia meds (namenda, aricept) as able Hold other home psychoactive substances   Beverely LowElena Adamo, MD, MPH [facility] PGY-2 06/07/2014 3:04 AM  STAFF NOTE: I have personally seen and evaluated the patient and reviewed the available data. I have discussed the patient with the housestaff officer or NP. I agree with the resident's note as documented above.  Briefly: [age]yof with PMH of dementia, failure to thrive s/p Peg placement, hypothyroidism presents after being found down in her SNF.  Hypoxic resp failure and septic shock likely 2/2 HCAP.  Getting volume resuscitated, pressors prn, broad spectrum ABX.  Will place central and a-lines.   The patient is critically ill with multiple organ systems failure and requires high complexity decision making for assessment and support, frequent evaluation and titration of therapies, application of advanced monitoring technologies and extensive interpretation of multiple  databases. Critical Care Time devoted to patient care services described in this note is 65 minutes.  Joen Laura. Adrian Tydarius Yawn, MD

## 2014-06-07 NOTE — Procedures (Signed)
Central Venous Catheter Insertion Procedure Note Melanie OmsClare Cummings [id number] 11/14/1927  Procedure: Insertion of Central Venous Catheter Indications: Drug and/or fluid administration and Frequent blood sampling  Procedure Details Consent: Risks of procedure as well as the alternatives and risks of each were explained to the (patient/caregiver).  Consent for procedure obtained. Time Out: Verified patient identification, verified procedure, site/side was marked, verified correct patient position, special equipment/implants available, medications/allergies/relevent history reviewed, required imaging and test results available.  Performed  Maximum sterile technique was used including antiseptics, cap, gloves, gown, hand hygiene, mask and sheet. Skin prep: Chlorhexidine; local anesthetic administered The RIJ and carotid were identified using bedside ultrasound.  The IJ was accessed using the introducer needled and then a wire was advanced through the needle.  The wire was confirmed to be in the IJ with ultrasound.  Then, an antimicrobial bonded/coated triple lumen catheter was placed in the right internal jugular vein using the Seldinger technique.  Evaluation Blood flow good Complications: No apparent complications Patient did tolerate procedure well. Chest X-ray ordered to verify placement.  CXR: normal.  [name] 06/07/2014, 5:41 AM

## 2014-06-07 NOTE — Progress Notes (Signed)
PULMONARY / CRITICAL CARE MEDICINE   Name: [name] MRN: [id number] DOB: 10/26/1928    ADMISSION DATE:  06/07/2014 CONSULTATION DATE:  06/07/2014  REFERRING MD: EDP  CHIEF COMPLAINT:  Found down at nursing home, hypoxic.   Initial Presentation: 80yo w/ h/o COPD, dementia, CHF PEG dependent who was apparently diagnosed with pneumonia yesterday. Tonight she was found down with respiratory distress and had O2 sats 72%. She was given narcan, after which she was somewhat responsive and showed better respiratory drive. On arrival she was hypotensive and in respiratory distress. She was intubated and started on propofol, after which her pressures dropped further and levophed was started.    STUDIES:    SIGNIFICANT EVENTS: 7/24 - dx with pneumonia, [facility] - found down, hypoxic, worsening respiratory distress 7/25 - admitted to ICU for respiratory failue, placed on pressors   SUBJECTIVE/ OVERNIGHT:  No sig change.    VITAL SIGNS: Temp:  [94.1 F (34.5 C)-98.2 F (36.8 C)] 97.9 F (36.6 C) (07/25 0900) Pulse Rate:  [59-95] 77 (07/25 0900) Resp:  [14-25] 15 (07/25 0900) BP: (59-147)/(23-84) 100/41 mmHg (07/25 0900) SpO2:  [85 %-100 %] 100 % (07/25 0900) Arterial Line BP: (98-120)/(50-59) 120/59 mmHg (07/25 0900) FiO2 (%):  [60 %] 60 % (07/25 0819) Weight:  [143 lb 15.4 oz (65.3 kg)-145 lb 1 oz (65.8 kg)] 143 lb 15.4 oz (65.3 kg) (07/25 0500) HEMODYNAMICS: CVP:  [7 mmHg] 7 mmHg VENTILATOR SETTINGS: Vent Mode:  [-] PRVC FiO2 (%):  [60 %] 60 % Set Rate:  [14 bmp] 14 bmp Vt Set:  [500 mL] 500 mL PEEP:  [5 cmH20] 5 cmH20 Plateau Pressure:  [15 cmH20-18 cmH20] 15 cmH20 INTAKE / OUTPUT:  Intake/Output Summary (Last 24 hours) at 06/07/14 1009 Last data filed at 06/07/14 0900  Gross per 24 hour  Intake   2050 ml  Output    550 ml  Net   1500 ml    PHYSICAL EXAMINATION: General: elderly female, sedated and intubated, unresponsive Neuro:  sedated HEENT:   MMM, NCAT, R scalp erythema/ ?abrasion and old lac Cardiovascular:  RRR, no murmur Lungs:  Good air movement, scattered exp wheezes Abdomen:  Soft, NTND Musculoskeletal: Warm and dry, no edema   LABS:  CBC  Recent Labs Lab 06/07/14 0055 06/07/14 0101 06/07/14 0614  WBC 12.0*  --  13.2*  HGB 11.9* 13.3 11.1*  HCT 36.6 39.0 34.3*  PLT 289  --  291   Coag's  Recent Labs Lab 06/07/14 0055  APTT 27   BMET  Recent Labs Lab 06/07/14 0101 06/07/14 0614  NA 142 143  K 3.9 3.0*  CL 108 107  CO2  --  20  BUN 83* 64*  CREATININE 1.00 0.63  GLUCOSE 190* 235*   Electrolytes  Recent Labs Lab 06/07/14 0614  CALCIUM 8.2*   Sepsis Markers  Recent Labs Lab 06/07/14 0102  LATICACIDVEN 1.37   ABG  Recent Labs Lab 06/07/14 0500  PHART 7.263*  PCO2ART 44.9  PO2ART 109.0*   Liver Enzymes No results found for this basename: AST, ALT, ALKPHOS, BILITOT, ALBUMIN,  in the last 168 hours Cardiac Enzymes  Recent Labs Lab 06/07/14 0055  TROPONINI <0.30   Glucose  Recent Labs Lab 06/07/14 0454 06/07/14 0817  GLUCAP 202* 149*    Imaging CXR -- Slightly worse aeration, RML infiltrate, LLL atx  ASSESSMENT / PLAN:  PULMONARY A: Acute respiratory failure COPD Possible HCAP P:   ETT 7/25>>> Cont vent  support - daily SBT  Duonebs  See ID  CARDIOVASCULAR A: Septic shock CHF, unclear baseline EF P:  CVL 7/25>>> Levophed and titrate as able  Hold home lasix Check CVP  Consider echo  Gentle volume   RENAL A: AKI - improved  P:   Gentle volume  F/u chem  Correct electrolytes PRN  GASTROINTESTINAL A:  PEG dependent GERD P:   SUP: PPI NPO TF  HEMATOLOGIC A:  No Acute issues P:  AM CBC  INFECTIOUS A:  Possible HCAP Septic shock (hypothermia, elevated WBC, need for pressors) 2/2 pulm source P:   Blood x2 7/25>>> Resp 7/25>>> Vanc 7/25>>> Zosyn 7/25>>> Follow blood and resp cultures  ENDOCRINE A:  Hypothyroidism No h/o  diabetes   P:    Continue synthroid  SSI   NEUROLOGIC A: Bipolar Dementia Chronic pain Failure to thrive  P:   Daily WUA - hold propofol for now for better neuro exam  ?CT head - persistent AMS, ?fall  Cont home dementia meds (namenda, aricept) as able Hold other home psych rx   Summary:  Admitted overnight with AMS, presumed r/t sepsis and hypoxic resp failure in setting HCAP. BP improved.  Consider head CT.  Need to discuss goals of care with family in this 80yo with dementia, extensive psych hx, PEG dependent, failure to thrive.    Dirk Dress, NP 06/07/2014  10:20 AM Pager: [phone] or [phone]   PCCM ATTENDING: I have interviewed and examined the patient and reviewed the database. I have formulated the assessment and plan as reflected in the note above with amendments made by me. 30 mins of direct critical care time provided  Billy Fischer, MD;  PCCM service; [place] [phone]

## 2014-06-07 NOTE — ED Notes (Addendum)
Called [phone] Son Gifford ShaveBeau no answer.

## 2014-06-08 ENCOUNTER — Inpatient Hospital Stay (HOSPITAL_COMMUNITY): Payer: Medicare Other

## 2014-06-08 DIAGNOSIS — G9340 Encephalopathy, unspecified: Secondary | ICD-10-CM | POA: Diagnosis present

## 2014-06-08 DIAGNOSIS — F068 Other specified mental disorders due to known physiological condition: Secondary | ICD-10-CM

## 2014-06-08 DIAGNOSIS — J189 Pneumonia, unspecified organism: Secondary | ICD-10-CM

## 2014-06-08 DIAGNOSIS — R6521 Severe sepsis with septic shock: Secondary | ICD-10-CM

## 2014-06-08 DIAGNOSIS — J9620 Acute and chronic respiratory failure, unspecified whether with hypoxia or hypercapnia: Secondary | ICD-10-CM

## 2014-06-08 DIAGNOSIS — R6520 Severe sepsis without septic shock: Secondary | ICD-10-CM

## 2014-06-08 DIAGNOSIS — A419 Sepsis, unspecified organism: Principal | ICD-10-CM

## 2014-06-08 LAB — GLUCOSE, CAPILLARY
GLUCOSE-CAPILLARY: 84 mg/dL (ref 70–99)
GLUCOSE-CAPILLARY: 95 mg/dL (ref 70–99)
Glucose-Capillary: 78 mg/dL (ref 70–99)
Glucose-Capillary: 88 mg/dL (ref 70–99)
Glucose-Capillary: 86 mg/dL (ref 70–99)
Glucose-Capillary: 81 mg/dL (ref 70–99)

## 2014-06-08 LAB — CBC
HCT: 31.2 % — ABNORMAL LOW (ref 36.0–46.0)
Hemoglobin: 10.2 g/dL — ABNORMAL LOW (ref 12.0–15.0)
MCH: 33.8 pg (ref 26.0–34.0)
MCHC: 32.7 g/dL (ref 30.0–36.0)
MCV: 103.3 fL — ABNORMAL HIGH (ref 78.0–100.0)
PLATELETS: 242 10*3/uL (ref 150–400)
RBC: 3.02 MIL/uL — ABNORMAL LOW (ref 3.87–5.11)
RDW: 13.4 % (ref 11.5–15.5)
WBC: 9.5 10*3/uL (ref 4.0–10.5)

## 2014-06-08 LAB — BASIC METABOLIC PANEL
Anion gap: 9 (ref 5–15)
BUN: 39 mg/dL — AB (ref 6–23)
CO2: 24 mEq/L (ref 19–32)
Calcium: 8.7 mg/dL (ref 8.4–10.5)
Chloride: 114 mEq/L — ABNORMAL HIGH (ref 96–112)
Creatinine, Ser: 0.51 mg/dL (ref 0.50–1.10)
GFR calc Af Amer: >90 mL/min (ref >90)
GFR calc non Af Amer: 86 mL/min — ABNORMAL LOW (ref >90)
GLUCOSE: 124 mg/dL — AB (ref 70–99)
POTASSIUM: 4.9 meq/L (ref 3.7–5.3)
SODIUM: 147 meq/L (ref 137–147)

## 2014-06-08 MED ORDER — FAMOTIDINE 20 MG PO TABS
20.0000 mg | ORAL_TABLET | Freq: Two times a day (BID) | ORAL | Status: DC
  Administered 2014-06-08 – 2014-06-11 (×7): 20 mg via ORAL
  Filled 2014-06-08 (×9): qty 1

## 2014-06-08 MED ORDER — FREE WATER
200.0000 mL | Freq: Three times a day (TID) | Status: DC
  Administered 2014-06-08 – 2014-06-09 (×3): 200 mL

## 2014-06-08 MED ORDER — DEXTROSE-NACL 5-0.45 % IV SOLN
INTRAVENOUS | Status: DC
  Administered 2014-06-08: 10:00:00 via INTRAVENOUS

## 2014-06-08 NOTE — Progress Notes (Signed)
PULMONARY / CRITICAL CARE MEDICINE   Name: [name] MRN: [id number] DOB: 03/11/1928    ADMISSION DATE:  06/07/2014 CONSULTATION DATE:  06/08/2014  REFERRING MD: EDP  CHIEF COMPLAINT:  Found down at nursing home, hypoxic.   Initial Presentation: 80yo w/ h/o COPD, dementia, CHF PEG dependent who was apparently diagnosed with pneumonia yesterday. Tonight she was found down with respiratory distress and had O2 sats 72%. She was given narcan, after which she was somewhat responsive and showed better respiratory drive. On arrival she was hypotensive and in respiratory distress. She was intubated and started on propofol, after which her pressures dropped further and levophed was started.    STUDIES:  7/26 CT head:   SIGNIFICANT EVENTS: 7/24 - dx with pneumonia[facility] - found down, hypoxic, worsening respiratory distress 7/25 - admitted to ICU for respiratory failue, placed on pressors   SUBJECTIVE/ OVERNIGHT:  Off pressors.  Easily agitated, HTN, tachy off propofol.   VITAL SIGNS: Temp:  [96.8 F (36 C)-99.3 F (37.4 C)] 99.1 F (37.3 C) (07/26 0900) Pulse Rate:  [23-78] 60 (07/26 0822) Resp:  [9-19] 16 (07/26 0900) BP: (71-129)/(39-80) 96/46 mmHg (07/26 0900) SpO2:  [99 %-100 %] 100 % (07/26 0822) Arterial Line BP: (88-146)/(44-79) 99/60 mmHg (07/26 0500) FiO2 (%):  [40 %-50 %] 40 % (07/26 0822) Weight:  [151 lb 3.8 oz (68.6 kg)] 151 lb 3.8 oz (68.6 kg) (07/26 0400) HEMODYNAMICS: CVP:  [8 mmHg] 8 mmHg VENTILATOR SETTINGS: Vent Mode:  [-] PRVC FiO2 (%):  [40 %-50 %] 40 % Set Rate:  [14 bmp-16 bmp] 16 bmp Vt Set:  [440 mL-500 mL] 440 mL PEEP:  [5 cmH20] 5 cmH20 Plateau Pressure:  [10 cmH20-21 cmH20] 10 cmH20 INTAKE / OUTPUT:  Intake/Output Summary (Last 24 hours) at 06/08/14 0941 Last data filed at 06/08/14 [id number]  Gross per 24 hour  Intake 2062.8 ml  Output   1650 ml  Net  412.8 ml    PHYSICAL EXAMINATION: General: elderly female, sedated  and intubated, NAD  Neuro:  Sedated, RASS -2, easily agitated, tachy, HTN off propofol per nsg HEENT:  MMM, NCAT, R scalp erythema/ ?abrasion and old lac Cardiovascular:  RRR, no murmur Lungs:  resps even non labored on vent, coarse  Abdomen:  Soft, NTND Musculoskeletal: Warm and dry, no edema   LABS:  CBC  Recent Labs Lab 06/07/14 0055 06/07/14 0101 06/07/14 0614 06/08/14 0352  WBC 12.0*  --  13.2* 9.5  HGB 11.9* 13.3 11.1* 10.2*  HCT 36.6 39.0 34.3* 31.2*  PLT 289  --  291 242   Coag's  Recent Labs Lab 06/07/14 0055  APTT 27   BMET  Recent Labs Lab 06/07/14 0101 06/07/14 0614 06/08/14 0352  NA 142 143 147  K 3.9 3.0* 4.9  CL 108 107 114*  CO2  --  20 24  BUN 83* 64* 39*  CREATININE 1.00 0.63 0.51  GLUCOSE 190* 235* 124*   Electrolytes  Recent Labs Lab 06/07/14 0614 06/07/14 1045 06/08/14 0352  CALCIUM 8.2*  --  8.7  MG  --  1.7  --   PHOS  --  3.1  --    Sepsis Markers  Recent Labs Lab 06/07/14 0102  LATICACIDVEN 1.37   ABG  Recent Labs Lab 06/07/14 0500  PHART 7.263*  PCO2ART 44.9  PO2ART 109.0*   Liver Enzymes No results found for this basename: AST, ALT, ALKPHOS, BILITOT, ALBUMIN,  in the last 168 hours Cardiac Enzymes  Recent Labs Lab 06/07/14 0055  TROPONINI <0.30   Glucose  Recent Labs Lab 06/07/14 1100 06/07/14 1524 06/07/14 1924 06/08/14 0007 06/08/14 0343 06/08/14 0816  GLUCAP 127* 90 97 95 84 78    Imaging CXR -- Slightly improved aeration, LLL consolidation   ASSESSMENT / PLAN:  PULMONARY A: Acute respiratory failure COPD Possible HCAP P:   ETT 7/25>>> Cont vent support - daily SBT  Duonebs  See ID  CARDIOVASCULAR A: Septic shock  CHF, unclear baseline EF P:  CVL 7/25>>> Levophed and titrate as able  Hold home lasix Check CVP =8 Gentle volume   RENAL A: AKI - improved  P:   Gentle volume  F/u chem  Correct electrolytes PRN Remove KCl from IVF  GASTROINTESTINAL A:  PEG  dependent GERD P:   SUP: PPI NPO TF  HEMATOLOGIC A:  No Acute issues P:  AM CBC  INFECTIOUS A:  Possible HCAP Septic shock (hypothermia, elevated WBC, need for pressors) 2/2 pulm source P:   Blood x2 7/25>>> Resp 7/25>>> Vanc 7/25>>> Zosyn 7/25>>> Follow blood and resp cultures  ENDOCRINE A:  Hypothyroidism No h/o diabetes   P:    Continue synthroid  Cont SSI   NEUROLOGIC A: Bipolar Dementia Chronic pain Failure to thrive  Acute encephalopathy Recent fall with forehead contusion P:   Daily WUA  check CT head - persistent AMS, recent fall, head lac Cont home dementia meds (namenda, aricept) as able Hold other home psych rx    Summary:  No sig change clinically and no sig change mental status.  Off pressors. Cont abx for HCAP, cultures pending. Need to discuss goals of care with family in this 80yo with dementia, extensive psych hx, PEG dependent, failure to thrive.     Dirk Dress, NP 06/08/2014  9:41 AM Pager: [phone] or [phone]  *Care during the described time interval was provided by me and/or other providers on the critical care team. I have reviewed this patient's available data, including medical history, events of note, physical examination and test results as part of my evaluation.   PCCM ATTENDING: I have interviewed and examined the patient and reviewed the database. I have formulated the assessment and plan as reflected in the note above with amendments made by me. 40 mins of direct critical care time provided  Billy Fischer, MD;  PCCM service; [place] [phone]

## 2014-06-08 NOTE — Progress Notes (Signed)
Chaplain responded to referral from RN, who stated that family had requested chaplain, although they were not available to be present at this time. Chaplain called pt's son, who had asked to be contacted by chaplain, but he was not available. Chaplain available for follow up as necessary.

## 2014-06-08 NOTE — Progress Notes (Addendum)
RT Note: Pt transported to CT and back with no complications noted.

## 2014-06-08 NOTE — Progress Notes (Signed)
Utilization review completed.  

## 2014-06-08 NOTE — ED Provider Notes (Signed)
CSN: [id number]     Arrival date & time 06/07/14  0054 History   First MD Initiated Contact with Patient 06/07/14 0049     Chief Complaint  Patient presents with  . Respiratory Distress     (Consider location/radiation/quality/duration/timing/severity/associated sxs/prior Treatment) HPI Comments:  Pt is 80yo w/ h/o COPD, dementia, CHF PEG dependent now at a nursing home where she was diagnosed with PNA y'day. Pt brought in to the ER with cc of unresponsiveness.  LEVEL 5 CAVEAT FOR ALTERED MENTAL STATUS.  Pt was found down with respiratory distress and had O2 sats in the 60s% per EMS at their arrival. With NRB she was in the 80s. She was given narcan due to pin point pupils 2mg  x 2 - and has become slightly more responsive. Pt has thick secretions in the ER from her mouth and is noted to be hypotensive and hypoxic.  The history is provided by the patient.    Past Medical History  Diagnosis Date  . COPD (chronic obstructive pulmonary disease)   . Hypertension   . Blind   . Hypothyroid   . Pacemaker   . Renal insufficiency   . Finger fracture   . Dementia   . Bipolar disorder   . Dysphagia     needs thick nectar liquids  . Atrial fibrillation   . Pneumonia   . Anxiety   . Diastolic heart failure    Past Surgical History  Procedure Laterality Date  . Insert / replace / remove pacemaker    . Tracheostomy  02/27/13    decannulated 03/29/13  . Gastrostomy tube placement  03/11/13   No family history on file. History  Substance Use Topics  . Smoking status: Former Smoker -- .5 years    Types: Cigarettes  . Smokeless tobacco: Not on file  . Alcohol Use: Not on file   OB History   Grav Para Term Preterm Abortions TAB SAB Ect Mult Living                 Review of Systems  Unable to perform ROS: Mental status change      Allergies  Review of patient's allergies indicates no known allergies.  Home Medications   Prior to Admission medications   Medication Sig Start  Date End Date Taking? Authorizing Provider  acetaminophen (TYLENOL) 500 MG tablet Take 500 mg by mouth 3 (three) times daily.   Yes Historical Provider, MD  baclofen (LIORESAL) 20 MG tablet Take 20 mg by mouth 3 (three) times daily.   Yes Historical Provider, MD  diazepam (VALIUM) 5 MG tablet Take 5 mg by mouth at bedtime.   Yes Historical Provider, MD  donepezil (ARICEPT) 10 MG tablet Take 10 mg by mouth at bedtime.   Yes Historical Provider, MD  DULoxetine (CYMBALTA) 60 MG capsule Take 60 mg by mouth daily.   Yes Historical Provider, MD  furosemide (LASIX) 20 MG tablet Take 20 mg by mouth 2 (two) times daily.   Yes Historical Provider, MD  levothyroxine (SYNTHROID, LEVOTHROID) 112 MCG tablet Take 1 tablet (112 mcg total) by mouth daily. 05/21/14  Yes [name] L [name], DO  meloxicam (MOBIC) 7.5 MG tablet Take 7.5 mg by mouth daily.   Yes Historical Provider, MD  Memantine HCl ER (NAMENDA XR) 28 MG CP24 Take 28 mg by mouth daily.   Yes Historical Provider, MD  moxifloxacin (AVELOX) 400 MG tablet Take 400 mg by mouth daily at 8 pm. For 7 days   Yes  Historical Provider, MD  Multiple Vitamins-Minerals (MULTIVITAMIN WITH MINERALS) tablet Take 1 tablet by mouth daily.   Yes Historical Provider, MD  pantoprazole sodium (PROTONIX) 40 mg/20 mL PACK Take 40 mg by mouth daily.   Yes Historical Provider, MD  potassium chloride 20 MEQ/15ML (10%) solution Take 20 mEq by mouth daily.   Yes Historical Provider, MD  Psyllium Husk POWD Take 5 mLs by mouth 2 (two) times daily.   Yes Historical Provider, MD  QUEtiapine (SEROQUEL) 50 MG tablet Take 50 mg by mouth at bedtime.   Yes Historical Provider, MD  Valproate Sodium (VALPROIC ACID) 250 MG/5ML SOLN Take 5 mLs by mouth 2 (two) times daily.   Yes Historical Provider, MD  VITAMIN D, CHOLECALCIFEROL, PO Take 2,000 mg by mouth daily. Liquid   Yes Historical Provider, MD   BP 93/38  Pulse 60  Temp(Src) 96.3 F (35.7 C)  Resp 19  Ht 5\' 4"  (1.626 m)  Wt 151 lb 3.8 oz  (68.6 kg)  BMI 25.95 kg/m2  SpO2 100% Physical Exam  Nursing note and vitals reviewed. Constitutional: She appears well-developed.  HENT:  Head: Atraumatic.  Eyes:  Pupils are 2 mm and equal  Cardiovascular: Normal rate.   Pulmonary/Chest: She is in respiratory distress.  Diffuse rhonchus breath sounds  Abdominal: Soft.  Neurological:  Not following commands or responding responding to painful stimuli. No GAG reflex.  Skin: Skin is warm.    ED Course  Procedures (including critical care time) Labs Review Labs Reviewed  CBC WITH DIFFERENTIAL - Abnormal; Notable for the following:    WBC 12.0 (*)    RBC 3.60 (*)    Hemoglobin 11.9 (*)    MCV 101.7 (*)    Neutrophils Relative % 78 (*)    Neutro Abs 9.3 (*)    All other components within normal limits  URINALYSIS, ROUTINE W REFLEX MICROSCOPIC - Abnormal; Notable for the following:    APPearance CLOUDY (*)    Leukocytes, UA SMALL (*)    All other components within normal limits  URINE MICROSCOPIC-ADD ON - Abnormal; Notable for the following:    Bacteria, UA MANY (*)    Casts HYALINE CASTS (*)    All other components within normal limits  BLOOD GAS, ARTERIAL - Abnormal; Notable for the following:    pH, Arterial 7.263 (*)    pO2, Arterial 109.0 (*)    Bicarbonate 19.6 (*)    Acid-base deficit 6.2 (*)    All other components within normal limits  GLUCOSE, CAPILLARY - Abnormal; Notable for the following:    Glucose-Capillary 202 (*)    All other components within normal limits  CBC - Abnormal; Notable for the following:    WBC 13.2 (*)    RBC 3.40 (*)    Hemoglobin 11.1 (*)    HCT 34.3 (*)    MCV 100.9 (*)    All other components within normal limits  BASIC METABOLIC PANEL - Abnormal; Notable for the following:    Potassium 3.0 (*)    Glucose, Bld 235 (*)    BUN 64 (*)    Calcium 8.2 (*)    GFR calc non Af Amer 80 (*)    Anion gap 16 (*)    All other components within normal limits  TRIGLYCERIDES - Abnormal;  Notable for the following:    Triglycerides 160 (*)    All other components within normal limits  CARBOXYHEMOGLOBIN - Abnormal; Notable for the following:    Total hemoglobin 11.6 (*)  All other components within normal limits  GLUCOSE, CAPILLARY - Abnormal; Notable for the following:    Glucose-Capillary 149 (*)    All other components within normal limits  GLUCOSE, CAPILLARY - Abnormal; Notable for the following:    Glucose-Capillary 127 (*)    All other components within normal limits  CBC - Abnormal; Notable for the following:    RBC 3.02 (*)    Hemoglobin 10.2 (*)    HCT 31.2 (*)    MCV 103.3 (*)    All other components within normal limits  BASIC METABOLIC PANEL - Abnormal; Notable for the following:    Chloride 114 (*)    Glucose, Bld 124 (*)    BUN 39 (*)    GFR calc non Af Amer 86 (*)    All other components within normal limits  I-STAT CHEM 8, ED - Abnormal; Notable for the following:    BUN 83 (*)    Glucose, Bld 190 (*)    All other components within normal limits  CULTURE, BLOOD (ROUTINE X 2)  CULTURE, BLOOD (ROUTINE X 2)  URINE CULTURE  CULTURE, RESPIRATORY (NON-EXPECTORATED)  MRSA PCR SCREENING  CULTURE, RESPIRATORY (NON-EXPECTORATED)  APTT  TROPONIN I  HEMOGLOBIN A1C  MAGNESIUM  PHOSPHORUS  GLUCOSE, CAPILLARY  GLUCOSE, CAPILLARY  GLUCOSE, CAPILLARY  GLUCOSE, CAPILLARY  GLUCOSE, CAPILLARY  GLUCOSE, CAPILLARY  GLUCOSE, CAPILLARY  GLUCOSE, CAPILLARY  BASIC METABOLIC PANEL  CBC  I-STAT CG4 LACTIC ACID, ED    Imaging Review Dg Chest Port 1 View  06/08/2014   CLINICAL DATA:  Respiratory failure  EXAM: PORTABLE CHEST - 1 VIEW  COMPARISON:  June 07, 2014  FINDINGS: Endotracheal tube tip is 4.1 cm above the carina. Central catheter tip is in the superior vena cava. Pacemaker leads are attached to the right atrium and right ventricle. No pneumothorax.  There is underlying emphysema. There is an area of airspace consolidation in the left base. Elsewhere  lungs are clear. Heart is borderline enlarged with pulmonary vascularity within normal limits. No adenopathy.  IMPRESSION: Tube and catheter positions as described without pneumothorax. Underlying emphysema. Patchy infiltrate left base.   Electronically Signed   By: Bretta BangWilliam  Woodruff M.D.   On: 06/08/2014 07:11   Dg Chest Port 1 View  06/07/2014   CLINICAL DATA:  Central line placement.  EXAM: PORTABLE CHEST - 1 VIEW  COMPARISON:  Chest radiograph performed earlier today at 1:24 a.m.  FINDINGS: The patient's endotracheal tube is seen ending 3-4 cm above the carina. A right IJ line is noted ending about the mid SVC.  Mildly worsened right mid lung and left basilar airspace opacities are seen. This may reflect mildly worsened pneumonia or mild interstitial edema. No pneumothorax is seen.  The cardiomediastinal silhouette is borderline normal in size. A pacemaker is noted overlying the left chest wall, with leads ending overlying the right atrium and right ventricle. No acute osseous abnormalities are identified. An external pacing pad is noted.  IMPRESSION: 1. Right IJ line noted ending about the mid SVC. 2. Mildly worsened right mid lung and left basilar airspace opacities may reflect mildly worsened pneumonia or mild interstitial edema.   Electronically Signed   By: Roanna RaiderJeffery  Chang M.D.   On: 06/07/2014 06:07   Dg Chest Port 1 View  06/07/2014   CLINICAL DATA:  Respiratory distress.  Endotracheal tube placement.  EXAM: PORTABLE CHEST - 1 VIEW  COMPARISON:  Chest radiograph performed 02/28/2013  FINDINGS: The patient's endotracheal tube is seen ending 3-4  cm above the carina.  Vague right mid lung airspace opacity may reflect pneumonia. No pleural effusion or pneumothorax is seen.  The cardiomediastinal silhouette is borderline normal in size. A pacemaker is seen overlying the left chest wall, with leads ending overlying the right atrium and right ventricle. No acute osseous abnormalities are identified. Chronic  right-sided rib deformities are seen. An external pacing pad is noted.  IMPRESSION: 1. Endotracheal tube seen ending 3-4 cm above the carina. 2. Vague right mid lung airspace opacity may reflect pneumonia.   Electronically Signed   By: Roanna Raider M.D.   On: 06/07/2014 01:51     EKG Interpretation None      MDM   Final diagnoses:  Septic shock  HCAP (healthcare-associated pneumonia)  Acute respiratory failure with hypoxia    Pt comes in with AMS.  DDx includes: ICH Stroke ACS Sepsis syndrome Infection - UTI/Pneumonia Electrolyte abnormality Drug overdose DKA Metabolic disorders including thyroid disorders, adrenal insufficiency Acute anemia Cancer of unknown origin Hypercapnia COPD Hypoxia   Pt comes in with cc of hypoxia, hypotension. Diffuse rhonchi sounds in the lungs and thick secretions from the mucosa. Pt was intubated - relatively speaking, difficult intubation - but we were able to successfully intubate her on our 3rd attempt - and we used a 6.5 cuffed tube.  Suspect PNA mostly. HCAP covered started. BP is improved - but norepi ordered to make sure MAP is set at 65 mmhg.  CRITICAL CARE Performed by: Derwood Kaplan   Total critical care time: 60 minutes  Critical care time was exclusive of separately billable procedures and treating other patients.  Critical care was necessary to treat or prevent imminent or life-threatening deterioration.  Critical care was time spent personally by me on the following activities: development of treatment plan with patient and/or surrogate as well as nursing, discussions with consultants, evaluation of patient's response to treatment, examination of patient, obtaining history from patient or surrogate, ordering and performing treatments and interventions, ordering and review of laboratory studies, ordering and review of radiographic studies, pulse oximetry and re-evaluation of patient's condition.  INTUBATION Performed  by: Derwood Kaplan  Required items: required blood products, implants, devices, and special equipment available Patient identity confirmed: provided demographic data and hospital-assigned identification number Time out: Immediately prior to procedure a "time out" was called to verify the correct patient, procedure, equipment, support staff and site/side marked as required.  Indications: airway control and hypoxic respiratroy failure  Intubation method: Glidescope Laryngoscopy   Preoxygenation: BVM  Sedatives: Etomidate Paralytic: Succinylcholine  Tube Size: 6.5 cuffed  Post-procedure assessment: chest rise and ETCO2 monitor Breath sounds: equal and absent over the epigastrium Tube secured with: ETT holder Chest x-ray interpreted by radiologist and me.  Chest x-ray findings: endotracheal tube in appropriate position  Patient tolerated the procedure well with no immediate complications.       Derwood Kaplan, MD 06/08/14 2342

## 2014-06-09 ENCOUNTER — Inpatient Hospital Stay (HOSPITAL_COMMUNITY): Payer: Medicare Other

## 2014-06-09 DIAGNOSIS — I517 Cardiomegaly: Secondary | ICD-10-CM

## 2014-06-09 DIAGNOSIS — J438 Other emphysema: Secondary | ICD-10-CM

## 2014-06-09 LAB — BASIC METABOLIC PANEL
Anion gap: 10 (ref 5–15)
BUN: 28 mg/dL — AB (ref 6–23)
CHLORIDE: 107 meq/L (ref 96–112)
CO2: 26 mEq/L (ref 19–32)
CREATININE: 0.41 mg/dL — AB (ref 0.50–1.10)
Calcium: 8.9 mg/dL (ref 8.4–10.5)
GFR calc Af Amer: >90 mL/min (ref >90)
GLUCOSE: 101 mg/dL — AB (ref 70–99)
POTASSIUM: 4.9 meq/L (ref 3.7–5.3)
Sodium: 143 mEq/L (ref 137–147)

## 2014-06-09 LAB — CULTURE, RESPIRATORY W GRAM STAIN

## 2014-06-09 LAB — GLUCOSE, CAPILLARY
GLUCOSE-CAPILLARY: 81 mg/dL (ref 70–99)
GLUCOSE-CAPILLARY: 95 mg/dL (ref 70–99)
GLUCOSE-CAPILLARY: 96 mg/dL (ref 70–99)
Glucose-Capillary: 104 mg/dL — ABNORMAL HIGH (ref 70–99)
Glucose-Capillary: 74 mg/dL (ref 70–99)
Glucose-Capillary: 78 mg/dL (ref 70–99)

## 2014-06-09 LAB — URINE CULTURE: Colony Count: >=100000

## 2014-06-09 LAB — CBC
HCT: 29.5 % — ABNORMAL LOW (ref 36.0–46.0)
HEMOGLOBIN: 9.5 g/dL — AB (ref 12.0–15.0)
MCH: 32.5 pg (ref 26.0–34.0)
MCHC: 32.2 g/dL (ref 30.0–36.0)
MCV: 101 fL — AB (ref 78.0–100.0)
Platelets: 249 10*3/uL (ref 150–400)
RBC: 2.92 MIL/uL — ABNORMAL LOW (ref 3.87–5.11)
RDW: 13.6 % (ref 11.5–15.5)
WBC: 10.6 10*3/uL — ABNORMAL HIGH (ref 4.0–10.5)

## 2014-06-09 LAB — CULTURE, RESPIRATORY

## 2014-06-09 MED ORDER — MIDAZOLAM HCL 2 MG/2ML IJ SOLN: 1.0000 mg | INTRAMUSCULAR | Status: DC | PRN

## 2014-06-09 MED ORDER — SODIUM CHLORIDE 0.9 % IV SOLN
1.0000 g | Freq: Two times a day (BID) | INTRAVENOUS | Status: DC
  Administered 2014-06-09 – 2014-06-11 (×5): 1 g via INTRAVENOUS
  Filled 2014-06-09 (×7): qty 1

## 2014-06-09 MED ORDER — PROPOFOL 10 MG/ML IV EMUL
0.0000 ug/kg/min | INTRAVENOUS | Status: DC
  Administered 2014-06-09: 15 ug/kg/min via INTRAVENOUS
  Filled 2014-06-09 (×2): qty 100

## 2014-06-09 MED ORDER — FENTANYL CITRATE 0.05 MG/ML IJ SOLN
50.0000 ug | INTRAMUSCULAR | Status: DC | PRN
  Administered 2014-06-10 (×3): 50 ug via INTRAVENOUS
  Filled 2014-06-09 (×2): qty 2

## 2014-06-09 MED ORDER — MIDAZOLAM HCL 2 MG/2ML IJ SOLN
1.0000 mg | INTRAMUSCULAR | Status: DC | PRN
  Administered 2014-06-09 (×2): 2 mg via INTRAVENOUS
  Filled 2014-06-09 (×2): qty 2

## 2014-06-09 MED ORDER — FUROSEMIDE 10 MG/ML IJ SOLN
40.0000 mg | Freq: Three times a day (TID) | INTRAMUSCULAR | Status: AC
  Administered 2014-06-09 (×2): 40 mg via INTRAVENOUS
  Filled 2014-06-09 (×2): qty 4

## 2014-06-09 NOTE — Progress Notes (Signed)
eLink Physician-Brief Progress Note Patient Name: [name] Drummer DOB: 05/05/1928 MRN: [id number]  Date of Service  06/09/2014   HPI/Events of Note   agitated  eICU Interventions  Add prn versed   Intervention Category Major Interventions: Other:  Laron Boorman 06/09/2014, 6:05 PM

## 2014-06-09 NOTE — Progress Notes (Signed)
PULMONARY / CRITICAL CARE MEDICINE   Name: [name] MRN: [id number] DOB: 07-08-1928    ADMISSION DATE:  06/07/2014 CONSULTATION DATE:  06/09/2014  REFERRING MD: EDP  CHIEF COMPLAINT:  Found down at nursing home, hypoxic.   Initial Presentation: 80yo w/ h/o COPD, dementia, CHF PEG dependent who was apparently diagnosed with pneumonia 7/24. On 7/25, she was found down with respiratory distress and had O2 sats 72%. She was given narcan, after which she was somewhat responsive and showed better respiratory drive. On arrival she was hypotensive and in respiratory distress. She was intubated and started on propofol, after which her pressures dropped further and levophed was started. CCM consulted.  STUDIES:  7/26 - CT head: No acute abnormalities, chronic atrophy and sml vessel ischemic changes, inflam paranasal sinuses 7/26 - CXR (patchy infiltrate LLL) 7/27 - CXR (bibasilar pneumonia cannot be excluded, b/l pulm edema and small pleural effusions)  SIGNIFICANT EVENTS: 7/24 - dx with pneumonia, started on avelox at [facility] 7/25 - found down, hypoxic, worsening respiratory distress, lactic acid 1.37 7/25 - admitted to ICU for respiratory failue, placed on pressors 7/26 - Off pressors 7/27 - Remains off pressors and vent support, propofol  LINES/TUBES: ETT 7/25 >>> CVL R IJ 7/25 >>> Art Line 7/25 >>>  ANTIBIOTICS: Ceftriaxone 7/25 >>> 7/25 Vancomycin 7/25 >>> Zosyn 7/25 >>>  CULTURES: Blood x2, 7/25 >>> Resp 7/25 >>> GPC / GNR Urine 7/25 >>> E.Coli >100,000 (s/s pending)  SUBJECTIVE/ OVERNIGHT: Per RN, remains off pressors today, did require low dose o/n since discontinued, cont sedation with Propofol, unclear baseline mental status. Not following commands.  VITAL SIGNS: Temp:  [96.1 F (35.6 C)-99.1 F (37.3 C)] 98.1 F (36.7 C) (07/27 0700) Pulse Rate:  [57-92] 59 (07/27 0700) Resp:  [15-23] 18 (07/27 0700) BP: (81-127)/(36-82) 90/36 mmHg (07/27 0700) SpO2:  [89 %-100 %]  100 % (07/27 0700) Arterial Line BP: (70-141)/(37-94) 89/54 mmHg (07/27 0700) FiO2 (%):  [30 %-40 %] 30 % (07/27 0438) Weight:  [154 lb 15.7 oz (70.3 kg)] 154 lb 15.7 oz (70.3 kg) (07/27 0345)  HEMODYNAMICS: CVP:  [9 mmHg] 9 mmHg  VENTILATOR SETTINGS: Vent Mode:  [-] PRVC FiO2 (%):  [30 %-40 %] 30 % Set Rate:  [16 bmp] 16 bmp Vt Set:  [440 mL] 440 mL PEEP:  [5 cmH20] 5 cmH20 Plateau Pressure:  [10 cmH20-15 cmH20] 14 cmH20  INTAKE / OUTPUT:  Intake/Output Summary (Last 24 hours) at 06/09/14 0731 Last data filed at 06/09/14 0600  Gross per 24 hour  Intake   2337 ml  Output   1100 ml  Net   1237 ml   PHYSICAL EXAMINATION: General: on vent, NAD  Neuro: Does not follow commands but eyes are open HEENT:  MMM, NCAT, R scalp erythema/ ?abrasion and old lac Cardiovascular:  RRR, no murmur Lungs:  resps even non labored on vent, coarse  Abdomen:  Soft, NTND Musculoskeletal: Warm and dry Ext - +2 pitting edea b/l LE, intact peripheral pulses  LABS:  CBC  Recent Labs Lab 06/07/14 0614 06/08/14 0352 06/09/14 0345  WBC 13.2* 9.5 10.6*  HGB 11.1* 10.2* 9.5*  HCT 34.3* 31.2* 29.5*  PLT 291 242 249   Coag's  Recent Labs Lab 06/07/14 0055  APTT 27   BMET  Recent Labs Lab 06/07/14 0614 06/08/14 0352 06/09/14 0345  NA 143 147 143  K 3.0* 4.9 4.9  CL 107 114* 107  CO2 20 24 26   BUN 64* 39* 28*  CREATININE 0.63  0.51 0.41*  GLUCOSE 235* 124* 101*   Electrolytes  Recent Labs Lab 06/07/14 0614 06/07/14 1045 06/08/14 0352 06/09/14 0345  CALCIUM 8.2*  --  8.7 8.9  MG  --  1.7  --   --   PHOS  --  3.1  --   --    Sepsis Markers  Recent Labs Lab 06/07/14 0102  LATICACIDVEN 1.37   ABG  Recent Labs Lab 06/07/14 0500  PHART 7.263*  PCO2ART 44.9  PO2ART 109.0*   Liver Enzymes No results found for this basename: AST, ALT, ALKPHOS, BILITOT, ALBUMIN,  in the last 168 hours Cardiac Enzymes  Recent Labs Lab 06/07/14 0055  TROPONINI <0.30    Glucose  Recent Labs Lab 06/08/14 0816 06/08/14 1215 06/08/14 1710 06/08/14 1955 06/08/14 2355 06/09/14 0348  GLUCAP 78 88 86 81 78 81    Imaging See above  ASSESSMENT / PLAN:  PULMONARY A: Acute respiratory failure - On vent COPD Possible HCAP P:   - SBT to hopefully extubate today. - Contact family regarding plan of care. - Duonebs. - See ID.  CARDIOVASCULAR A: Septic shock - Resolving, off pressors CHF, unclear baseline EF P:  - Monitor BP, follow MAP goal >65, remains off pressors. - Diuretics as ordered. - Check CVP 6. - IVF KVO - 2D echo.  RENAL A: AKI - improved  Hypernatremia - 147 P:   - S/p gentle volume, currently IV KVO. - Discontinued free water per tube. - Monitor BMET. - Correct electrolytes as indicated. - Lasix 40 mg IV q8 x2 doses.  GASTROINTESTINAL A: Hx PEG dependent GERD P:   - SUP: Pepcid PO - NPO on vent - TF  HEMATOLOGIC A:   No Acute issues Leukocytosis - Improved P:  - Monitor CBC  INFECTIOUS A: Septic shock, likely secondary to presumed HCAP - Resolved, off pressors HCAP, presumed P:   - Antibiotics and cultures as above  ENDOCRINE A:  Hypothyroidism No h/o diabetes   P:    - Continue synthroid  - Cont SSI   NEUROLOGIC A: Bipolar Dementia Chronic pain Failure to thrive  Acute encephalopathy Recent fall with forehead contusion P:   - Daily WUA  - Cont home dementia meds (namenda, aricept) as able - Hold other home psych rx   Saralyn PilarAlexander Karamalegos, DO [facility], PGY-2  Summary:  Overall without significant change, remains off pressors, remains on sedation due to agitation while on vent support. Continue antibiotics in setting of presumed HCAP. Unclear baseline mental status in setting of chronic dementia, PEG dependent, hx failure to thrive at SNF. Plan to discuss goals of care with family.  CC time 35 minutes.  Patient seen and examined, agree with above note.  I  dictated the care and orders written for this patient under my direction.  Alyson ReedyWesam G Yacoub, MD [phone]  06/09/2014  7:31 AM

## 2014-06-09 NOTE — Progress Notes (Signed)
I have left a message for [name]'s son [name] to schedule meeting time hopefully for tomorrow 7/28 if they are willing for goals of care discussion. Will await call back. Thank you for this consult.  Yong ChannelAlicia Aden Sek, NP Palliative Medicine Team Pager # [phone] (M-F 8a-5p) Team Phone # [phone] (Nights/Weekends)

## 2014-06-09 NOTE — Clinical Social Work Psychosocial (Signed)
Clinical Social Work Department BRIEF PSYCHOSOCIAL ASSESSMENT 06/09/2014  Patient:  [name],[name]     Account Number:  [id number]     Admit date:  06/07/2014  Clinical Social Worker:  Read DriversINGLE,Shakendra Griffeth, LCSWA  Date/Time:  06/09/2014 03:01 PM  Referred by:  Physician  Date Referred:  06/09/2014 Referred for  SNF Placement   Other Referral:   none   Interview type:  Other - See comment Other interview type:   pt on vent unable to participate in assessment- only responds to painful stimuli  daughter, [name], [phone]  left message for son, [name] [phone]    PSYCHOSOCIAL DATA Living Status:  FACILITY Admitted from facility:  [facility] [place] Level of care:  Skilled Nursing Facility Primary support name:  [name], son and [name], daughter Primary support relationship to patient:  CHILD, ADULT Degree of support available:   adequate    CURRENT CONCERNS Current Concerns  Post-Acute Placement   Other Concerns:   none    SOCIAL WORK ASSESSMENT / PLAN Pt is vent dependent and only responding to painful stimuli at this point in care.  CSW left message with son, [name], [phone].  CSW contacted daughter, [name], [phone]. [name] reports that pt is a resident [facility] she has been since [facility] (2014).    Daughter states that pt has had decline in health in the past two years which has been difficult to "watch". Daughter states that 3 years ago, pt lived in [place], close to herself, before her brother, [name], [place] [facility] [place] [facility] closer to him due to her health declining. 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  Daughter, [name] is supportive of brother, [name] in his attempts to care for pt.    Daughter, reports though she is in [place], she and [name] will toether, amde  decisions for pt.    Daughter, [name] and son, [name] are agreeable for pt to return to [facility] once medically dc'd from the hospital.   Assessment/plan status:  Psychosocial Support/Ongoing Assessment of Needs Other assessment/ plan:   FL2 updated  PASARR- confirm existing   Information/referral to community resources:   SNF    PATIENT'S/FAMILY'S RESPONSE TO PLAN OF CARE: Daughter and son are agreeable to pt returning to [facility] once medically stable.       Vickii PennaGina Karlis Cregg, LCSWA [phone]  Clinical Social Work

## 2014-06-09 NOTE — Progress Notes (Signed)
eLink Physician-Brief Progress Note Patient Name: Lenon OmsClare Rathke DOB: 03/25/1928 MRN: [id number]  Date of Service  06/09/2014   HPI/Events of Note   Remains agitated.  eICU Interventions  Will restart diprivan with PAD 3 protocol.   Intervention Category Major Interventions: Other:  Narjis Mira 06/09/2014, 9:53 PM

## 2014-06-10 ENCOUNTER — Inpatient Hospital Stay (HOSPITAL_COMMUNITY): Payer: Medicare Other

## 2014-06-10 DIAGNOSIS — J9600 Acute respiratory failure, unspecified whether with hypoxia or hypercapnia: Secondary | ICD-10-CM

## 2014-06-10 LAB — BASIC METABOLIC PANEL
ANION GAP: 13 (ref 5–15)
BUN: 29 mg/dL — ABNORMAL HIGH (ref 6–23)
CALCIUM: 9.2 mg/dL (ref 8.4–10.5)
CO2: 31 mEq/L (ref 19–32)
Chloride: 103 mEq/L (ref 96–112)
Creatinine, Ser: 0.58 mg/dL (ref 0.50–1.10)
GFR, EST NON AFRICAN AMERICAN: 82 mL/min — AB (ref >90)
Glucose, Bld: 113 mg/dL — ABNORMAL HIGH (ref 70–99)
POTASSIUM: 3.6 meq/L — AB (ref 3.7–5.3)
SODIUM: 147 meq/L (ref 137–147)

## 2014-06-10 LAB — BLOOD GAS, ARTERIAL
Acid-Base Excess: 7 mmol/L — ABNORMAL HIGH (ref 0.0–2.0)
BICARBONATE: 31.1 meq/L — AB (ref 20.0–24.0)
Drawn by: 36277
FIO2: 0.4 %
MECHVT: 440 mL
O2 Saturation: 98.2 %
PATIENT TEMPERATURE: 98.6
PCO2 ART: 44.5 mmHg (ref 35.0–45.0)
PEEP: 5 cmH2O
PH ART: 7.458 — AB (ref 7.350–7.450)
RATE: 16 resp/min
TCO2: 32.4 mmol/L (ref 0–100)
pO2, Arterial: 83.9 mmHg (ref 80.0–100.0)

## 2014-06-10 LAB — CBC
HCT: 31 % — ABNORMAL LOW (ref 36.0–46.0)
HEMOGLOBIN: 9.9 g/dL — AB (ref 12.0–15.0)
MCH: 32.5 pg (ref 26.0–34.0)
MCHC: 31.9 g/dL (ref 30.0–36.0)
MCV: 101.6 fL — ABNORMAL HIGH (ref 78.0–100.0)
PLATELETS: 315 10*3/uL (ref 150–400)
RBC: 3.05 MIL/uL — ABNORMAL LOW (ref 3.87–5.11)
RDW: 13.7 % (ref 11.5–15.5)
WBC: 11.6 10*3/uL — ABNORMAL HIGH (ref 4.0–10.5)

## 2014-06-10 LAB — GLUCOSE, CAPILLARY
GLUCOSE-CAPILLARY: 112 mg/dL — AB (ref 70–99)
GLUCOSE-CAPILLARY: 114 mg/dL — AB (ref 70–99)
GLUCOSE-CAPILLARY: 115 mg/dL — AB (ref 70–99)
GLUCOSE-CAPILLARY: 117 mg/dL — AB (ref 70–99)
GLUCOSE-CAPILLARY: 99 mg/dL (ref 70–99)
Glucose-Capillary: 106 mg/dL — ABNORMAL HIGH (ref 70–99)

## 2014-06-10 LAB — TRIGLYCERIDES: TRIGLYCERIDES: 93 mg/dL (ref <150)

## 2014-06-10 LAB — PHOSPHORUS: PHOSPHORUS: 3.4 mg/dL (ref 2.3–4.6)

## 2014-06-10 LAB — MAGNESIUM: MAGNESIUM: 1.9 mg/dL (ref 1.5–2.5)

## 2014-06-10 MED ORDER — FUROSEMIDE 10 MG/ML IJ SOLN
40.0000 mg | Freq: Three times a day (TID) | INTRAMUSCULAR | Status: DC
  Administered 2014-06-10: 40 mg via INTRAVENOUS

## 2014-06-10 MED ORDER — FUROSEMIDE 10 MG/ML IJ SOLN: 40.0000 mg | Freq: Three times a day (TID) | INTRAMUSCULAR | Status: DC

## 2014-06-10 MED ORDER — SODIUM CHLORIDE 0.9 % IV BOLUS (SEPSIS)
500.0000 mL | Freq: Once | INTRAVENOUS | Status: AC
  Administered 2014-06-10: 500 mL via INTRAVENOUS

## 2014-06-10 MED ORDER — POTASSIUM CHLORIDE 20 MEQ/15ML (10%) PO LIQD
20.0000 meq | ORAL | Status: AC
  Administered 2014-06-10 (×2): 20 meq via ORAL
  Filled 2014-06-10 (×2): qty 15

## 2014-06-10 MED ORDER — FUROSEMIDE 10 MG/ML IJ SOLN
INTRAMUSCULAR | Status: AC
  Filled 2014-06-10: qty 4

## 2014-06-10 MED ORDER — RESOURCE THICKENUP CLEAR PO POWD
ORAL | Status: DC | PRN
  Filled 2014-06-10: qty 125

## 2014-06-10 NOTE — Progress Notes (Addendum)
PULMONARY / CRITICAL CARE MEDICINE   Name: [name] MRN: [id number] DOB: 08/03/1928    ADMISSION DATE:  06/07/2014 CONSULTATION DATE:  06/10/2014  REFERRING MD: EDP  CHIEF COMPLAINT:  Found down at nursing home, hypoxic.   Initial Presentation: 80yo w/ h/o COPD, dementia, CHF PEG dependent who was apparently diagnosed with pneumonia 7/24. On 7/25, she was found down with respiratory distress and had O2 sats 72%. She was given narcan, after which she was somewhat responsive and showed better respiratory drive. On arrival she was hypotensive and in respiratory distress. She was intubated and started on propofol, after which her pressures dropped further and levophed was started. CCM consulted.  STUDIES:  7/26 - CT head: No acute abnormalities, chronic atrophy and sml vessel ischemic changes, inflam paranasal sinuses 7/26 - CXR (patchy infiltrate LLL) 7/27 - CXR (bibasilar pneumonia cannot be excluded, b/l pulm edema and small pleural effusions) 7/27 - ECHO EF 60-65% with Grade 1 diastolic dysfunction  SIGNIFICANT EVENTS: 7/24 - dx with pneumonia, [facility] - found down, hypoxic, worsening respiratory distress, lactic acid 1.37 7/25 - admitted to ICU for respiratory failue, placed on pressors 7/26 - Off pressors 7/27 - Remains off pressors and vent support, wean propofol         - Consulted Palliative Med >> pending GOC meeting 7/28 - trial extubation today, with some inc agitation (req inc propofol)         - Off Levo, CXR (worsening b/l pulm edema, rotated), Cont Lasix diuresis  LINES/TUBES: ETT 7/25 >>> CVL R IJ 7/25 >>> Art Line 7/25 >>>  ANTIBIOTICS: Ceftriaxone 7/25 >>> 7/25 Vancomycin 7/25 >>> Zosyn 7/25 >>>  CULTURES: Blood x2, 7/25 >>> Resp 7/25 >>> GPC / GNR >> Normal resp flora Urine 7/25 >>> E.Coli >100,000 (ESBL, sensitive to Zosyn)  SUBJECTIVE/ OVERNIGHT: Per RN, persistent agitation overnight required additional PRN sedating medications  and did not tolerate propofol wean, per RT with intermittent periods of inc RR 40-50s and agitation on vent, taken off Levo 0630 today. Today RT remained on vent support, proceed with extubation trial, pt with good cough and vocalizing, patient persistently agitated, not following commands.  VITAL SIGNS: Temp:  [97.5 F (36.4 C)-99.8 F (37.7 C)] 98.9 F (37.2 C) (07/28 0700) Pulse Rate:  [44-121] 70 (07/28 0700) Resp:  [13-32] 25 (07/28 0700) BP: (67-130)/(41-108) 118/56 mmHg (07/28 0700) SpO2:  [93 %-100 %] 96 % (07/28 0700) Arterial Line BP: (85-94)/(51-58) 94/58 mmHg (07/27 0900) FiO2 (%):  [30 %] 30 % (07/28 0600) Weight:  [146 lb 6.2 oz (66.4 kg)] 146 lb 6.2 oz (66.4 kg) (07/28 0500)  HEMODYNAMICS:    VENTILATOR SETTINGS: Vent Mode:  [-] PRVC FiO2 (%):  [30 %] 30 % Set Rate:  [16 bmp] 16 bmp Vt Set:  [440 mL] 440 mL PEEP:  [5 cmH20] 5 cmH20 Pressure Support:  [5 cmH20] 5 cmH20 Plateau Pressure:  [11 cmH20-19 cmH20] 14 cmH20  INTAKE / OUTPUT:  Intake/Output Summary (Last 24 hours) at 06/10/14 [id number] Last data filed at 06/10/14 0700  Gross per 24 hour  Intake 1711.8 ml  Output   2880 ml  Net -1168.2 ml   PHYSICAL EXAMINATION: General: on vent, NAD  Neuro: Agitated, not alert, sedated, Does not follow commands but eyes are open Cardiovascular:  RRR, no murmur Lungs:  Intermittent episodes tachypnea, bilateral coarse breath sounds Abdomen:  Soft, NTND Musculoskeletal: Warm and dry Ext - persistent mildly improved +1 pitting edea b/l LE, intact  peripheral pulses  LABS:  CBC  Recent Labs Lab 06/08/14 0352 06/09/14 0345 06/10/14 0400  WBC 9.5 10.6* 11.6*  HGB 10.2* 9.5* 9.9*  HCT 31.2* 29.5* 31.0*  PLT 242 249 315   Coag's  Recent Labs Lab 06/07/14 0055  APTT 27   BMET  Recent Labs Lab 06/08/14 0352 06/09/14 0345 06/10/14 0400  NA 147 143 147  K 4.9 4.9 3.6*  CL 114* 107 103  CO2 24 26 31   BUN 39* 28* 29*  CREATININE 0.51 0.41* 0.58  GLUCOSE  124* 101* 113*   Electrolytes  Recent Labs Lab 06/07/14 0614 06/07/14 1045 06/08/14 0352 06/09/14 0345 06/10/14 0400  CALCIUM 8.2*  --  8.7 8.9 9.2  MG  --  1.7  --   --  1.9  PHOS  --  3.1  --   --  3.4   Sepsis Markers  Recent Labs Lab 06/07/14 0102  LATICACIDVEN 1.37   ABG  Recent Labs Lab 06/07/14 0500 06/10/14 0344  PHART 7.263* 7.458*  PCO2ART 44.9 44.5  PO2ART 109.0* 83.9   Liver Enzymes No results found for this basename: AST, ALT, ALKPHOS, BILITOT, ALBUMIN,  in the last 168 hours Cardiac Enzymes  Recent Labs Lab 06/07/14 0055  TROPONINI <0.30   Glucose  Recent Labs Lab 06/09/14 0757 06/09/14 1255 06/09/14 1543 06/09/14 2038 06/10/14 0001 06/10/14 0356  GLUCAP 104* 74 95 96 117* 114*    Imaging See above  ASSESSMENT / PLAN:  PULMONARY A: Acute respiratory failure - Remains on vent, plan for trial extubation today COPD Pulm Edema - On CXR 7/28 Possible HCAP P:   - Trial extubation today, required inc sedation due to agitation on vent - Duonebs - Repeat CXR in AM - See ID  CARDIOVASCULAR A: Septic shock - Resolving, off pressors Chronic CHF, intact systolic fxn with Grade 1 Diastolic - ECHO 7/27 with EF 60-65% P:  - Monitor BP, follow MAP goal >65, remains off pressors - Trending down BP with MAP low 60s - Check CVP 3-5 - Ordered 500 cc NS bolus - Hold current diuresis with Lasix, good UOP 2.8 L, persistent b/l edema on CXR - IVF KVO  RENAL A: AKI - improved  Hypernatremia - 147 P:   - S/p gentle volume, currently IV KVO. - Monitor BMET. - Correct electrolytes as indicated. - Hold Lasix diuresis, s/p Lasix 40mg  IV x 1 dose today  GASTROINTESTINAL A: Hx PEG dependent GERD P:   - SUP: Pepcid PO - NPO with ice chips, pending formal SLP eval - Tube feeds @ 35 cc, per protocol  HEMATOLOGIC A:   No Acute issues Leukocytosis P:  - Monitor CBC  INFECTIOUS A: Septic shock, likely secondary to presumed HCAP -  Resolved, off pressors 7/28 HCAP, less likely E.Coli, ESBL on urine culture P:   - Consider wean off Vancomycin today, afebrile, stable WBC, and resp cx normal flora - Continue Meropenem for ESBL - Antibiotics and cultures as above  ENDOCRINE A:  Hypothyroidism No h/o diabetes   P:    - Continue synthroid  - Cont SSI   NEUROLOGIC A: Bipolar Dementia Chronic pain Failure to thrive  Acute encephalopathy Recent fall with forehead contusion - Negative Head CT P:   - DC Propofol s/p extubation - Fentanyl IV PRN - Cont home dementia meds (namenda, aricept) as able - Hold other home psych rx - Consult Palliative, family meeting / GOC, significant recent functional decline x 6 months  Melanie Pilar,  DO [facility], PGY-2  TODAY's Summary:  Trial extubation today closely monitor resp status, unable to tolerate propofol wean d/t worsening agitation in setting of chronic dementia with high risk for delirium. Off Levo today 7/28, wean off Vancomycin today cont Meropenem for E.Coli ESBL and less likely HCAP, trial cont Lasix diuresis. Palliative consulted anticipating family meeting / GOC / code status.  Code Status - Full  Updated and discussed with pt's son [name] yesterday 7/27 via phone, remains full code and son is ok with trach/peg if needs be remains full code.  Will attempt to extubate today and evaluate patient, if fails then will reintubate and trach.  CC time 35 min.  [name], M.D. [facility] Pulmonary/Critical Care Medicine. Pager: [phone]. After hours pager: [phone].  06/10/2014  7:28 AM

## 2014-06-10 NOTE — Progress Notes (Signed)
Placed back to full support, restless with increased respiratory rest.

## 2014-06-10 NOTE — Evaluation (Signed)
Clinical/Bedside Swallow Evaluation Patient Details  Name: [name] MRN: [id number] Date of Birth: 16-Jul-1928  Today's Date: 06/10/2014 Time: 1450-1510 SLP Time Calculation (min): 20 min  Past Medical History:  Past Medical History  Diagnosis Date  . COPD (chronic obstructive pulmonary disease)   . Hypertension   . Blind   . Hypothyroid   . Pacemaker   . Renal insufficiency   . Finger fracture   . Dementia   . Bipolar disorder   . Dysphagia     needs thick nectar liquids  . Atrial fibrillation   . Pneumonia   . Anxiety   . Diastolic heart failure    Past Surgical History:  Past Surgical History  Procedure Laterality Date  . Insert / replace / remove pacemaker    . Tracheostomy  02/27/13    decannulated 03/29/13  . Gastrostomy tube placement  03/11/13   HPI:  [name] 42JW was apparently diagnosed with pneumonia 7/24. On 7/25, she was found down with respiratory distress and had O2 sats 72%. She was given narcan, after which she was somewhat responsive and showed better respiratory drive. On arrival she was hypotensive and in respiratory distress. She was intubated from 7/25 to 7//28. Pmh COPD, hypothyroidism, a-fib, dysphonia, trach, PEG, GERD with esophagitis and pharyngitis, and dysphagia. Pt has a history of trach, PEG, vocal fold mass/injury, bronchial ring hypertrophy. After decannulation pt had MBS in 9/14 (outpatient) showing no aspiration, perhaps slight delay in swallow with flash penetration with large straw sips. Able to masticate soft solid with dentures in place. MD documentation on 05/21/14 states pt has been on a dys 3/mehcnical soft diet.    Assessment / Plan / Recommendation Clinical Impression  Pt does not demonstrate overt evidence of aspiration and has clear vocal quality folloiwng intubation. She does have watering eyes when given large sips of water and appears to have a slight delay in swallow initiation (though suspect she is orally holding the bolus  for a prolonged period and then swallowing). Given these abnormal findings, history of dysphagia and admission with pna (previously on thin liqudis), recommend initiating a conservative dys 1 (puree) diet with nectar thick liquids to reduce risk of silent aspiration. Will f/u for tolerance.    Aspiration Risk  Moderate    Diet Recommendation Dysphagia 1 (Puree);Nectar-thick liquid   Liquid Administration via: Cup Medication Administration: Crushed with puree Supervision: Staff to assist with self feeding Compensations: Small sips/bites Postural Changes and/or Swallow Maneuvers: Seated upright 90 degrees    Other  Recommendations Oral Care Recommendations: Oral care BID Other Recommendations: Order thickener from pharmacy   Follow Up Recommendations  Skilled Nursing facility    Frequency and Duration min 2x/week  2 weeks   Pertinent Vitals/Pain NA    SLP Swallow Goals     Swallow Study Prior Functional Status       General HPI: [name] was apparently diagnosed with pneumonia 7/24. On 7/25, she was found down with respiratory distress and had O2 sats 72%. She was given narcan, after which she was somewhat responsive and showed better respiratory drive. On arrival she was hypotensive and in respiratory distress. She was intubated from 7/25 to 7//28. Pmh COPD, hypothyroidism, a-fib, dysphonia, trach, PEG, GERD with esophagitis and pharyngitis, and dysphagia. Pt has a history of trach, PEG, vocal fold mass/injury, bronchial ring hypertrophy. After decannulation pt had MBS in 9/14 (outpatient) showing no aspiration, perhaps slight delay in swallow with flash penetration with large straw sips. Able to  masticate soft solid with dentures in place. MD documentation on 05/21/14 states pt has been on a dys 3/mehcnical soft diet.  Type of Study: Bedside swallow evaluation Previous Swallow Assessment: see HPI Diet Prior to this Study: NPO;PEG tube Temperature Spikes Noted:  No Respiratory Status: Nasal cannula History of Recent Intubation: Yes Length of Intubations (days): 4 days Date extubated: 06/10/14 Behavior/Cognition: Alert;Cooperative;Requires cueing Oral Cavity - Dentition: Edentulous;Dentures, not available Self-Feeding Abilities: Total assist Patient Positioning: Upright in bed Baseline Vocal Quality: Clear Volitional Cough: Strong Volitional Swallow: Unable to elicit    Oral/Motor/Sensory Function Overall Oral Motor/Sensory Function: Other (comment) (does not follow oral motor commands)   Ice Chips Ice chips: Not tested   Thin Liquid Thin Liquid: Impaired Presentation: Cup;Straw Oral Phase Functional Implications: Prolonged oral transit Pharyngeal  Phase Impairments: Suspected delayed Swallow;Other (comments) (watering eyes)    Nectar Thick Nectar Thick Liquid: Not tested   Honey Thick Honey Thick Liquid: Not tested   Puree Puree: Impaired Presentation: Spoon Oral Phase Functional Implications: Prolonged oral transit Pharyngeal Phase Impairments: Suspected delayed Swallow   Solid   GO    Solid: Not tested      Harlon DittyBonnie Yerachmiel Spinney, MA CCC-SLP [phone]  Claudine MoutonDeBlois, Shanieka Blea Caroline 06/10/2014,3:23 PM

## 2014-06-10 NOTE — Progress Notes (Signed)
Chaplain responded to pt request. Chaplain provided emotional support to pt who stated she was hurting and did "not want to be here anymore." Pt did not seem to have pastoral needs at this time.

## 2014-06-10 NOTE — Procedures (Signed)
Extubation Procedure Note  Patient Details:   Name: [name] DOB: 02/17/1928 MRN: [id number]   Airway Documentation:     Evaluation  O2 sats: stable throughout Complications: No apparent complications Patient did tolerate procedure well. Bilateral Breath Sounds: Coarse crackles;Diminished Suctioning: Airway Yes Placed on 4l/min , vocalizes.  [name], [name] 06/10/2014, 10:31 AM

## 2014-06-10 NOTE — Progress Notes (Signed)
I was able to reach Ms. Kouba's son Gifford ShaveBeau today and he has agreed to meet with me tomorrow 7/29 1430 pm to discuss goals of care. He tells me that the past 9 months have been abruptly different for her with drastic decline. He also has many concerns regarding her pain management. Will meet with him to further discuss tomorrow. Thank you for this consult.  Yong ChannelAlicia Doniel Maiello, NP Palliative Medicine Team Pager # [phone] (M-F 8a-5p) Team Phone # [phone] (Nights/Weekends)

## 2014-06-11 ENCOUNTER — Inpatient Hospital Stay (HOSPITAL_COMMUNITY): Payer: Medicare Other

## 2014-06-11 ENCOUNTER — Other Ambulatory Visit (HOSPITAL_COMMUNITY): Payer: Self-pay

## 2014-06-11 ENCOUNTER — Inpatient Hospital Stay
Admission: AD | Admit: 2014-06-11 | Discharge: 2014-07-02 | Disposition: A | Discharge status: Skilled Nursing Facility | Payer: Self-pay | Source: Ambulatory Visit | Attending: Internal Medicine | Admitting: Internal Medicine

## 2014-06-11 DIAGNOSIS — G8929 Other chronic pain: Secondary | ICD-10-CM

## 2014-06-11 DIAGNOSIS — Z515 Encounter for palliative care: Secondary | ICD-10-CM

## 2014-06-11 LAB — CBC
HEMATOCRIT: 32.9 % — AB (ref 36.0–46.0)
HEMOGLOBIN: 10.5 g/dL — AB (ref 12.0–15.0)
MCH: 32.4 pg (ref 26.0–34.0)
MCHC: 31.9 g/dL (ref 30.0–36.0)
MCV: 101.5 fL — ABNORMAL HIGH (ref 78.0–100.0)
Platelets: 322 10*3/uL (ref 150–400)
RBC: 3.24 MIL/uL — AB (ref 3.87–5.11)
RDW: 13.3 % (ref 11.5–15.5)
WBC: 11.9 10*3/uL — ABNORMAL HIGH (ref 4.0–10.5)

## 2014-06-11 LAB — GLUCOSE, CAPILLARY
GLUCOSE-CAPILLARY: 130 mg/dL — AB (ref 70–99)
Glucose-Capillary: 113 mg/dL — ABNORMAL HIGH (ref 70–99)
Glucose-Capillary: 117 mg/dL — ABNORMAL HIGH (ref 70–99)
Glucose-Capillary: 118 mg/dL — ABNORMAL HIGH (ref 70–99)
Glucose-Capillary: 122 mg/dL — ABNORMAL HIGH (ref 70–99)

## 2014-06-11 LAB — BLOOD GAS, ARTERIAL
Acid-Base Excess: 9.1 mmol/L — ABNORMAL HIGH (ref 0.0–2.0)
Bicarbonate: 33 mEq/L — ABNORMAL HIGH (ref 20.0–24.0)
DRAWN BY: 36277
O2 Content: 5 L/min
O2 SAT: 94.7 %
PATIENT TEMPERATURE: 98.6
PH ART: 7.482 — AB (ref 7.350–7.450)
TCO2: 34.4 mmol/L (ref 0–100)
pCO2 arterial: 44.6 mmHg (ref 35.0–45.0)
pO2, Arterial: 72.6 mmHg — ABNORMAL LOW (ref 80.0–100.0)

## 2014-06-11 LAB — MAGNESIUM: MAGNESIUM: 1.8 mg/dL (ref 1.5–2.5)

## 2014-06-11 LAB — BASIC METABOLIC PANEL
Anion gap: 14 (ref 5–15)
BUN: 26 mg/dL — AB (ref 6–23)
CO2: 31 meq/L (ref 19–32)
Calcium: 9.6 mg/dL (ref 8.4–10.5)
Chloride: 101 mEq/L (ref 96–112)
Creatinine, Ser: 0.44 mg/dL — ABNORMAL LOW (ref 0.50–1.10)
GFR calc Af Amer: >90 mL/min (ref >90)
GLUCOSE: 107 mg/dL — AB (ref 70–99)
Potassium: 4.2 mEq/L (ref 3.7–5.3)
SODIUM: 146 meq/L (ref 137–147)

## 2014-06-11 LAB — PHOSPHORUS: Phosphorus: 2.3 mg/dL (ref 2.3–4.6)

## 2014-06-11 MED ORDER — PRO-STAT SUGAR FREE PO LIQD: 30.0000 mL | ORAL | Status: DC

## 2014-06-11 MED ORDER — IPRATROPIUM-ALBUTEROL 0.5-2.5 (3) MG/3ML IN SOLN: 3.0000 mL | RESPIRATORY_TRACT | Status: DC | PRN

## 2014-06-11 MED ORDER — MEMANTINE HCL 10 MG PO TABS: 10.0000 mg | ORAL_TABLET | Freq: Two times a day (BID) | ORAL | Status: DC

## 2014-06-11 MED ORDER — HEPARIN SODIUM (PORCINE) 5000 UNIT/ML IJ SOLN: 5000.0000 [IU] | Freq: Three times a day (TID) | INTRAMUSCULAR | Status: DC

## 2014-06-11 MED ORDER — INSULIN ASPART 100 UNIT/ML ~~LOC~~ SOLN: 0.0000 [IU] | SUBCUTANEOUS | Status: DC

## 2014-06-11 MED ORDER — FENTANYL CITRATE 0.05 MG/ML IJ SOLN: 50.0000 ug | INTRAMUSCULAR | Status: DC | PRN

## 2014-06-11 MED ORDER — DULOXETINE HCL 60 MG PO CPEP
60.0000 mg | ORAL_CAPSULE | Freq: Every day | ORAL | Status: DC
  Administered 2014-06-11: 60 mg via ORAL
  Filled 2014-06-11: qty 1

## 2014-06-11 MED ORDER — SODIUM CHLORIDE 0.9 % IV SOLN: 1.0000 g | Freq: Two times a day (BID) | INTRAVENOUS | Status: DC

## 2014-06-11 MED ORDER — FAMOTIDINE 20 MG PO TABS: 20.0000 mg | ORAL_TABLET | Freq: Two times a day (BID) | ORAL | Status: DC

## 2014-06-11 MED ORDER — IOHEXOL 300 MG/ML  SOLN
50.0000 mL | Freq: Once | INTRAMUSCULAR | Status: AC | PRN
  Administered 2014-06-11: 40 mL

## 2014-06-11 MED ORDER — QUETIAPINE FUMARATE 50 MG PO TABS
50.0000 mg | ORAL_TABLET | Freq: Every day | ORAL | Status: DC
  Filled 2014-06-11: qty 1

## 2014-06-11 NOTE — Evaluation (Signed)
Physical Therapy Evaluation Patient Details Name: [name] MRN: [id number] DOB: 06-Jul-1928 Today's Date: 06/11/2014   History of Present Illness  Pt is 80yo w/ h/o COPD, dementia, CHF PEG dependent who was apparently diagnosed with pneumonia yesterday. Tonight she was found down with respiratory distress and had O2 sats 72%. She was given narcan, after which she was somewhat responsive and showed better respiratory drive. On arrival she was hypotensive and in respiratory distress. She was intubated and started on propofol, after which her pressures dropped further and levophed was started  Clinical Impression  Pt admitted with AMS and generalized weakness. Pt currently with functional limitations due to the deficits listed below (see PT Problem List). Pt sat EOB x5 min with mod/ max A.  Pt will benefit from skilled PT to increase their independence and safety with mobility to allow discharge to the venue listed below. PT will continue to follow.       Follow Up Recommendations SNF;Supervision/Assistance - 24 hour    Equipment Recommendations  None recommended by PT    Recommendations for Other Services       Precautions / Restrictions Precautions Precautions: Fall Restrictions Weight Bearing Restrictions: No Other Position/Activity Restrictions: legally blind      Mobility  Bed Mobility Overal bed mobility: Needs Assistance;+2 for physical assistance Bed Mobility: Rolling;Sidelying to Sit;Sit to Supine Rolling: +2 for physical assistance;Max assist Sidelying to sit: +2 for physical assistance;Max assist   Sit to supine: +2 for physical assistance;Max assist   General bed mobility comments: pt agreeable to mobility and wants to get out of bed but minimal participation. Pt followed one step commands such as initiating rolling but all movement required manual facilitation.   Transfers                 General transfer comment: unable  Ambulation/Gait              General Gait Details: unable  Stairs            Wheelchair Mobility    Modified Rankin (Stroke Patients Only)       Balance Overall balance assessment: Needs assistance Sitting-balance support: Bilateral upper extremity supported;Feet supported Sitting balance-Leahy Scale: Poor Sitting balance - Comments: required max A to maintain sitting EOB but as she was shifted and performed leg exercises, was able to sit with mod A. Maintained sitting EOB 5 mins, asking to return to supine the entire time Postural control: Left lateral lean                                   Pertinent Vitals/Pain VSS    Home Living Family/patient expects to be discharged to:: Skilled nursing facility                      Prior Function Level of Independence: Needs assistance   Gait / Transfers Assistance Needed: pt reports that she gets out of bed but has dementia so history questionable     Comments: pt verbalizing and answering questions on eval but question accuracy     Hand Dominance        Extremity/Trunk Assessment   Upper Extremity Assessment: Generalized weakness;RUE deficits/detail;LUE deficits/detail;Defer to OT evaluation RUE Deficits / Details: bilateral extremities tender to touch and no voluntary mvmt of UE's noted, did not move them on command     LUE Deficits / Details: same as RUE  Lower Extremity Assessment: RLE deficits/detail;LLE deficits/detail;Generalized weakness RLE Deficits / Details: hip flex 2/5, knee ext 2+/5, tender to touch LLE Deficits / Details: hip flex 2/5, knee ext 2+/5, tender to touch  Cervical / Trunk Assessment: Kyphotic  Communication   Communication: No difficulties;Other (comment) (raspy speech)  Cognition Arousal/Alertness: Awake/alert Behavior During Therapy: Restless;Agitated Overall Cognitive Status: No family/caregiver present to determine baseline cognitive functioning Area of Impairment: Following  commands;Problem solving     Memory: Decreased short-term memory Following Commands: Follows one step commands inconsistently     Problem Solving: Decreased initiation;Difficulty sequencing;Requires verbal cues;Requires tactile cues General Comments: pt yelling out for help before my arrival. Asked to get off the table when we began working. As soon as she was sitting up though, wanted to lie back down.     General Comments      Exercises General Exercises - Lower Extremity Ankle Circles/Pumps: AROM;Both;10 reps;Supine;Seated Long Arc Quad: AROM;Both;5 reps;Seated      Assessment/Plan    PT Assessment Patient needs continued PT services  PT Diagnosis Generalized weakness;Acute pain   PT Problem List Decreased strength;Decreased range of motion;Decreased activity tolerance;Decreased balance;Decreased mobility;Decreased coordination;Decreased cognition;Decreased safety awareness;Decreased knowledge of precautions;Pain  PT Treatment Interventions DME instruction;Functional mobility training;Therapeutic activities;Therapeutic exercise;Balance training;Neuromuscular re-education;Cognitive remediation;Patient/family education   PT Goals (Current goals can be found in the Care Plan section) Acute Rehab PT Goals Patient Stated Goal: none stated PT Goal Formulation: Patient unable to participate in goal setting Time For Goal Achievement: 06/25/14 Potential to Achieve Goals: Fair    Frequency Min 2X/week   Barriers to discharge        Co-evaluation               End of Session Equipment Utilized During Treatment: Oxygen Activity Tolerance: Treatment limited secondary to agitation Patient left: in bed;with call bell/phone within reach Nurse Communication: Mobility status         Time: [id number] PT Time Calculation (min): 34 min   Charges:   PT Evaluation $Initial PT Evaluation Tier I: 1 Procedure PT Treatments $Therapeutic Activity: 23-37 mins   PT G Codes:         [name], PT  Acute Rehab Services  [phone]   Cummings, [place] 06/11/2014, 3:15 PM

## 2014-06-11 NOTE — Progress Notes (Signed)
Patient transferred to 5702 on 3 liters nasal canula and on monitor. Bedside report exchanged with [name], RN. Discharge Summary and [facility]ra form given to unit secretary. 06/11/2014 1740 [name], [name]

## 2014-06-11 NOTE — Progress Notes (Signed)
PULMONARY / CRITICAL CARE MEDICINE   Name: [name] MRN: [id number] DOB: 09-04-1928    ADMISSION DATE:  06/07/2014 CONSULTATION DATE:  06/11/2014  REFERRING MD: EDP  CHIEF COMPLAINT:  Found down at nursing home, hypoxic.   Initial Presentation: 80yo w/ h/o COPD, dementia, CHF PEG dependent who was apparently diagnosed with pneumonia 7/24. On 7/25, she was found down with respiratory distress and had O2 sats 72%. She was given narcan, after which she was somewhat responsive and showed better respiratory drive. On arrival she was hypotensive and in respiratory distress. She was intubated and started on propofol, after which her pressures dropped further and levophed was started. CCM consulted.  STUDIES:  7/26 - CT head: No acute abnormalities, chronic atrophy and sml vessel ischemic changes, inflam paranasal sinuses 7/26 - CXR (patchy infiltrate LLL) 7/27 - CXR (bibasilar pneumonia cannot be excluded, b/l pulm edema and small pleural effusions) 7/27 - ECHO EF 60-65% with Grade 1 diastolic dysfunction 7/29 - CXR (improved b/l airspaces with reduced pulm edema)  SIGNIFICANT EVENTS: 7/24 - dx with pneumonia, started on avelox at [facility] 7/25 - found down, hypoxic, worsening respiratory distress, lactic acid 1.37 7/25 - admitted to ICU for respiratory failue, placed on pressors 7/26 - Off pressors 7/27 - Remains off pressors and vent support, wean propofol         - Consulted Palliative Med >> pending GOC meeting 7/28 - Extubated, some inc agitation (req inc propofol) >> improved following extubation, SLP passed (Dys 1 diet)         - Off Levo, CXR (worsening b/l pulm edema, rotated), Cont Lasix diuresis 7/29 - Remains off pressors, Palliative family meeting today, PT eval  LINES/TUBES: ETT 7/25 >>> 7/28 CVL R IJ 7/25 >>> Art Line 7/25 >>>  ANTIBIOTICS: Ceftriaxone 7/25 >>> 7/25 Vancomycin 7/25 >>> 7/28 Zosyn 7/25 >>> 7/27 Meropenem 7/27 >>>  CULTURES: Blood x2, 7/25 >>>  NGTD Resp 7/25 >>> GPC / GNR >> Normal resp flora Urine 7/25 >>> E.Coli >100,000 (ESBL, sensitive to Zosyn)  SUBJECTIVE/ OVERNIGHT: S/p trial extubation yesterday (7/28), passed SLP (dys 1 diet), overnight no events, remained off pressors. Patient maintaining appropriate O2 sats. Today with improved mental status, multiple complaints (including generalized pain, dyspnea, dizziness) and primarily requesting to "get out of the hospital", tolerating PO.  VITAL SIGNS: Temp:  [97.5 F (36.4 C)-99.8 F (37.7 C)] 99.7 F (37.6 C) (07/29 0700) Pulse Rate:  [59-92] 91 (07/29 0700) Resp:  [12-44] 25 (07/29 0700) BP: (99-144)/(40-113) 140/101 mmHg (07/29 0700) SpO2:  [94 %-100 %] 95 % (07/29 0700) FiO2 (%):  [30 %-40 %] 30 % (07/28 0900) Weight:  [144 lb 2.9 oz (65.4 kg)] 144 lb 2.9 oz (65.4 kg) (07/29 0600)  HEMODYNAMICS: CVP:  [3 mmHg-9 mmHg] 8 mmHg  VENTILATOR SETTINGS: Vent Mode:  [-] PRVC FiO2 (%):  [30 %-40 %] 30 % Set Rate:  [16 bmp] 16 bmp Vt Set:  [440 mL] 440 mL PEEP:  [5 cmH20] 5 cmH20 Pressure Support:  [10 cmH20] 10 cmH20 Plateau Pressure:  [15 cmH20] 15 cmH20  INTAKE / OUTPUT:  Intake/Output Summary (Last 24 hours) at 06/11/14 7829 Last data filed at 06/11/14 0700  Gross per 24 hour  Intake 1602.1 ml  Output   2315 ml  Net -712.9 ml   PHYSICAL EXAMINATION: General: upset but cooperative, NAD  Neuro: awake, alert, intermittent agitation, oriented (name only), follows some commands Cardiovascular: RRR, no murmur Lungs:  Improved bilateral coarse breath sounds, reduced crackles. No  wheezing. Speaks full sentences, no increased work of breathing Abdomen:  Soft, NTND, +active BS Skin: Warm and dry Ext - improved edema b/l LE now trace to +1, intact peripheral pulses  LABS:  CBC  Recent Labs Lab 06/09/14 0345 06/10/14 0400 06/11/14 0450  WBC 10.6* 11.6* 11.9*  HGB 9.5* 9.9* 10.5*  HCT 29.5* 31.0* 32.9*  PLT 249 315 322   Coag's  Recent Labs Lab  06/07/14 0055  APTT 27   BMET  Recent Labs Lab 06/09/14 0345 06/10/14 0400 06/11/14 0450  NA 143 147 146  K 4.9 3.6* 4.2  CL 107 103 101  CO2 26 31 31   BUN 28* 29* 26*  CREATININE 0.41* 0.58 0.44*  GLUCOSE 101* 113* 107*   Electrolytes  Recent Labs Lab 06/07/14 1045  06/09/14 0345 06/10/14 0400 06/11/14 0450  CALCIUM  --   < > 8.9 9.2 9.6  MG 1.7  --   --  1.9 1.8  PHOS 3.1  --   --  3.4 2.3  < > = values in this interval not displayed. Sepsis Markers  Recent Labs Lab 06/07/14 0102  LATICACIDVEN 1.37   ABG  Recent Labs Lab 06/07/14 0500 06/10/14 0344 06/11/14 0328  PHART 7.263* 7.458* 7.482*  PCO2ART 44.9 44.5 44.6  PO2ART 109.0* 83.9 72.6*   Liver Enzymes No results found for this basename: AST, ALT, ALKPHOS, BILITOT, ALBUMIN,  in the last 168 hours Cardiac Enzymes  Recent Labs Lab 06/07/14 0055  TROPONINI <0.30   Glucose  Recent Labs Lab 06/10/14 0752 06/10/14 1216 06/10/14 1638 06/10/14 1928 06/10/14 2349 06/11/14 0439  GLUCAP 99 106* 112* 115* 117* 122*    ASSESSMENT / PLAN:  PULMONARY A: Acute respiratory failure - Extubated 7/28 COPD - No wheezing on exam Pulm Edema - Improved on repeat CXR 7/29 HCAP, less likely - Improving P:   - Supplemental O2 PRN - Duonebs - Repeat CXR in AM if worsens - See ID  CARDIOVASCULAR A: Septic shock - Resolving, off pressors Chronic CHF, intact systolic fxn with Grade 1 Diastolic P:  - Monitor BP, MAP goal >65 - Check CVP - Consider repeat diuresis today, as tolerated by BP - IVF KVO  RENAL A: AKI - Resolved, stable Cr Hypernatremia - Resolved P:   - KVO IVF - Monitor BMET - Correct electrolytes as indicated. - Consider diuresis as above, 24 hr UOP good with 2.3 L, s/p Lasix 40mg  IV x 1 dose yesterday  GASTROINTESTINAL A: Hx PEG dependent GERD P:   - Advanced to Dys 1 + nectar thick liq, passed SLP eval - SUP: Pepcid PO - Holding peg tube feeds  HEMATOLOGIC A:   No  Acute issues Leukocytosis - Stable P:  - Monitor CBC  INFECTIOUS A: Septic shock, likely secondary to UTI- Resolved, off pressors 7/28 UTI, E.Coli ESBL HCAP, less likely etiology of sepsis P:   - Continue Meropenem for ESBL - Antibiotics and cultures as above  ENDOCRINE A:  Hypothyroidism No h/o diabetes   P:    - Continue synthroid  - Cont SSI   NEUROLOGIC A: Acute Encephalopathy, in setting of sepsis and ICU setting, at high risk given chronic dementia Bipolar Dementia, chronic Chronic pain Failure to thrive Recent fall with forehead contusion - Negative Head CT P:   - Fentanyl IV and Versed PRN - Cont home dementia meds (namenda, aricept) as able - Resume home Cymbalta and Seroquel daily today - Consult Palliative, family meeting / GOC scheduled for today  7/29  Saralyn PilarAlexander Karamalegos, DO [facility], PGY-2  TODAY's Summary: Tolerated extubation well yesterday and remains off pressors, with overall improved mental status but with persistent delirium in setting of chronic dementia, will resume home psych meds today, tolerating PO, cont abx with Meropenem for E.Coli ESBL UTI, improved pulm edema and less likely HCAP.  Code Status - Full  Patient's son has been updated and plans to meet with Palliative today for goals of care.  At this time, prior to meeting remains full code and son is ok with trach/peg if needs be remains full code.  Will attempt to d/c to LTAC today, if no bed available then [facility]  Patient seen and examined, agree with above note.  I dictated the care and orders written for this patient under my direction.  Alyson ReedyWesam G Yacoub, MD [phone]  06/11/2014  7:28 AM

## 2014-06-11 NOTE — Progress Notes (Signed)
RT note-patient refused treatment once started, treatment removed and 02 continues at 3l/min [place]

## 2014-06-11 NOTE — Progress Notes (Signed)
Patient transferred on 3 liters nasal canula and on monitor. Bedside report exchanged with [name], RN. Discharge Summary and Cobra form given to unit secretary. 06/11/2014 1740 Eileen Stanfordarter, Lochlyn Zullo

## 2014-06-11 NOTE — Progress Notes (Signed)
UR Completed.  [name] Jane [phone] 06/11/2014  

## 2014-06-11 NOTE — Discharge Summary (Signed)
Physician Discharge Summary  Patient ID: [name] MRN: [id number] DOB/AGE: 1928-05-27 80 y.o.  Admit date: 06/07/2014 Discharge date: 06/11/2014    Discharge Diagnoses:  Principal Problem:   Septic shock Active Problems:   Dysphagia   Acute respiratory failure   Acute encephalopathy    Brief Summary: [name] is an 80yo w/ h/o COPD, dementia, CHF, PEG dependent who was apparently diagnosed with pneumonia 7/24 in SNF and started on avelox. On 7/25, she was found down with respiratory distress and had O2 sats 72%. On arrival she was hypotensive and in respiratory distress. She was intubated and started on propofol, after which her pressures dropped further and levophed was started. PCCM was called to admit. She was admitted to ICU with septic shock, acute respiratory failure r/t HCAP and pulmonary edema with underlying COPD.  She was treated with IV abx, ventilatory support, nebulized BD's and pressor support.  When BP improved and she was off pressors she was gently diuresed with good UOP and improved resp status.  She was extubated 7/28 and continues to improve from a respiratory standpoint.  Course was c/b E Coli UTI (ESBL), acute encephalopathy and acute renal insufficiency (now resolved).  Pt remains weak and deconditioned with multiple medical problems and is ready for d/c to LTAC for further rehab efforts and ongoing abx.  Need to continue ongoing goals of care discussions with family in this elderly pt with underlying dementia, failure to thrive.        D/C plan by d/c diagnosis   Acute respiratory failure - Extubated 7/28  COPD - No wheezing on exam  Pulm Edema - Improved on repeat CXR 7/29  HCAP, less likely - Improving  Plan -  - Supplemental O2 PRN  - Duonebs  - Repeat CXR in AM if worsens  - See abx below  - mobilize   Septic shock - Resolving, off pressors  Chronic CHF, intact systolic fxn with Grade 1 Diastolic  P:  - Monitor BP, MAP goal >65  - Consider  repeat IV diuresis v resume home lasix (20 BID) - IVF KVO  AKI - Resolved, stable Cr  Hypernatremia - Resolved  P:  - KVO IVF  - Monitor BMET  - Correct electrolytes as indicated.   Hx PEG dependent  GERD  P:  - Advanced to Dys 1 + nectar thick liq, passed SLP eval  - SUP: Pepcid PO  - Holding peg tube feeds  Septic shock, likely secondary to UTI- Resolved, off pressors 7/28  UTI, E.Coli ESBL  HCAP, less likely etiology of sepsis  P:  - Continue Meropenem for ESBL  - Antibiotics and cultures as below    Acute Encephalopathy, in setting of sepsis and ICU setting, at high risk given chronic dementia  Bipolar  Dementia, chronic  Chronic pain  Failure to thrive  Recent fall with forehead contusion - Negative Head CT  P:  - Cont home dementia meds (namenda, aricept) as able  - Resume home Cymbalta and Seroquel daily today  - Consult Palliative, family meeting / GOC scheduled for 7/29    STUDIES:  7/26 - CT head: No acute abnormalities, chronic atrophy and sml vessel ischemic changes, inflam paranasal sinuses  7/26 - CXR (patchy infiltrate LLL)  7/27 - CXR (bibasilar pneumonia cannot be excluded, b/l pulm edema and small pleural effusions)  7/27 - ECHO EF 60-65% with Grade 1 diastolic dysfunction  4/58 - CXR (improved b/l airspaces with reduced pulm edema)   SIGNIFICANT EVENTS:  7/24 - dx with pneumonia, started on avelox at [facility]  7/25 - found down, hypoxic, worsening respiratory distress, lactic acid 1.37  7/25 - admitted to ICU for respiratory failue, placed on pressors  7/26 - Off pressors  7/27 - Remains off pressors and vent support, wean propofol  - Consulted Palliative Med >> pending [place] meeting  7/28 - Extubated, some inc agitation (req inc propofol) >> improved following extubation, SLP passed (Dys 1 diet)    LINES/TUBES:  ETT 7/25 >>> 7/28  CVL R IJ 7/25 >>>  Art Line 7/25 >>> out  ANTIBIOTICS:  Ceftriaxone 7/25 >>> 7/25  Vancomycin 7/25 >>> 7/28   Zosyn 7/25 >>> 7/27  Meropenem 7/27 >>>   CULTURES: Blood x2, 7/25 >>> NGTD  Resp 7/25 >>> GPC / GNR >> Normal resp flora  Urine 7/25 >>> E.Coli >100,000 (ESBL, sensitive to Zosyn)    Filed Vitals:   06/11/14 1000 06/11/14 1100 06/11/14 1200 06/11/14 1300  BP: 118/89 97/69 104/66 149/72  Pulse: 84 87 83 90  Temp: 99.3 F (37.4 C) 99.5 F (37.5 C) 99 F (37.2 C) 99.1 F (37.3 C)  TempSrc:      Resp: _0 32  Height:      Weight:      SpO2: 94% 94% 95% 93%     Discharge Labs  BMET  Recent Labs Lab 06/07/14 0614 06/07/14 1045 06/08/14 0352 06/09/14 0345 06/10/14 0400 06/11/14 0450  NA 143  --  147 143 147 146  K 3.0*  --  4.9 4.9 3.6* 4.2  CL 107  --  114* 107 103 101  CO2 20  --  _1 GLUCOSE 235*  --  124* 101* 113* 107*  BUN 64*  --  39* 28* 29* 26*  CREATININE 0.63  --  0.51 0.41* 0.58 0.44*  CALCIUM 8.2*  --  8.7 8.9 9.2 9.6  MG  --  1.7  --   --  1.9 1.8  PHOS  --  3.1  --   --  3.4 2.3     CBC   Recent Labs Lab 06/09/14 0345 06/10/14 0400 06/11/14 0450  HGB 9.5* 9.9* 10.5*  HCT 29.5* 31.0* 32.9*  WBC 10.6* 11.6* 11.9*  PLT 249 315 322   Anti-Coagulation No results found for this basename: INR,  in the last 168 hours           Follow-up Information   Follow up with [name], [name], DO. Schedule an appointment as soon as possible for a visit in 1 week. (post d[facility]c from [facility] )    Specialty:  Geriatric Medicine   Contact information:   [address] [address] [phone]          Medication List    STOP taking these medications       acetaminophen 500 MG tablet  Commonly known as:  TYLENOL     baclofen 20 MG tablet  Commonly known as:  LIORESAL     diazepam 5 MG tablet  Commonly known as:  VALIUM     furosemide 20 MG tablet  Commonly known as:  LASIX     meloxicam 7.5 MG tablet  Commonly known as:  MOBIC     moxifloxacin 400 MG tablet  Commonly known as:  AVELOX     NAMENDA XR 28 MG  Cp24  Generic drug:  Memantine HCl ER  Replaced by:  memantine 10 MG tablet  potassium chloride 20 MEQ/15ML (10%) solution     Psyllium Husk Powd      TAKE these medications       donepezil 10 MG tablet  Commonly known as:  ARICEPT  Take 10 mg by mouth at bedtime.     DULoxetine 60 MG capsule  Commonly known as:  CYMBALTA  Take 60 mg by mouth daily.     famotidine 20 MG tablet  Commonly known as:  PEPCID  Take 1 tablet (20 mg total) by mouth 2 (two) times daily.     feeding supplement (PRO-STAT SUGAR FREE 64) Liqd  Place 30 mLs into feeding tube daily.     fentaNYL 0.05 MG/ML injection  Commonly known as:  SUBLIMAZE  Inject 1 mL (50 mcg total) into the vein every 2 (two) hours as needed for severe pain.     heparin 5000 UNIT/ML injection  Inject 1 mL (5,000 Units total) into the skin every 8 (eight) hours.     insulin aspart 100 UNIT/ML injection  Commonly known as:  novoLOG  Inject 0-15 Units into the skin every 4 (four) hours.     ipratropium-albuterol 0.5-2.5 (3) MG/3ML Soln  Commonly known as:  DUONEB  Take 3 mLs by nebulization every 4 (four) hours as needed.     levothyroxine 112 MCG tablet  Commonly known as:  SYNTHROID, LEVOTHROID  Take 1 tablet (112 mcg total) by mouth daily.     memantine 10 MG tablet  Commonly known as:  NAMENDA  Take 1 tablet (10 mg total) by mouth 2 (two) times daily.     meropenem 1 g in sodium chloride 0.9 % 100 mL  Inject 1 g into the vein every 12 (twelve) hours.     multivitamin with minerals tablet  Take 1 tablet by mouth daily.     PROTONIX 40 mg/20 mL Pack  Generic drug:  pantoprazole sodium  Take 40 mg by mouth daily.     QUEtiapine 50 MG tablet  Commonly known as:  SEROQUEL  Take 50 mg by mouth at bedtime.     Valproic Acid 250 MG/5ML Soln  Take 5 mLs by mouth 2 (two) times daily.     VITAMIN D (CHOLECALCIFEROL) PO  Take 2,000 mg by mouth daily. Liquid          Disposition: LTAC   Discharged  Condition: Felicitas Sine has met maximum benefit of inpatient care and is medically stable and cleared for discharge.  Patient is pending follow up as above.      Time spent on disposition:  Greater than 35 minutes.   SignedDarlina Sicilian, NP 06/11/2014  1:58 PM Pager: [phone] or [phone]  *Care during the described time interval was provided by me and/or other providers on the critical care team. I have reviewed this patient's available data, including medical history, events of note, physical examination and test results as part of my evaluation.  Rush Farmer, M.D. [facility] Pulmonary/Critical Care Medicine. Pager: [phone]. After hours pager: [phone].

## 2014-06-11 NOTE — Progress Notes (Signed)
Speech Language Pathology Treatment: Dysphagia  Patient Details Name: [name] MRN: [id number] DOB: 01/05/1928 Today's Date: 06/11/2014 Time: [id number] SLP Time Calculation (min): 13 min  Assessment / Plan / Recommendation Clinical Impression  Pt demonstrates better tolerance of nectar thick liquids today than seen with thin yesterday during assessment. Pt did still have watery eye x1, unsure if this was present prior to PO. Timing of swallow still mildly delayed with occasional second swallow to complete bolus transit. Otherwise, pt tolerate nectar and puree well. Recommend pt continue current diet. Will f/u for further tolerance and discussion with family.    HPI HPI: [name] [id number] was apparently diagnosed with pneumonia 7/24. On 7/25, she was found down with respiratory distress and had O2 sats 72%. She was given narcan, after which she was somewhat responsive and showed better respiratory drive. On arrival she was hypotensive and in respiratory distress. She was intubated from 7/25 to 7//28. Pmh COPD, hypothyroidism, a-fib, dysphonia, trach, PEG, GERD with esophagitis and pharyngitis, and dysphagia. Pt has a history of trach, PEG, vocal fold mass/injury, bronchial ring hypertrophy. After decannulation pt had MBS in 9/14 (outpatient) showing no aspiration, perhaps slight delay in swallow with flash penetration with large straw sips. Able to masticate soft solid with dentures in place. MD documentation on 05/21/14 states pt has been on a dys 3/mehcnical soft diet.    Pertinent Vitals NA  SLP Plan  Continue with current plan of care    Recommendations Diet recommendations: Dysphagia 1 (puree);Nectar-thick liquid Liquids provided via: Cup Medication Administration: Crushed with puree Supervision: Staff to assist with self feeding Compensations: Small sips/bites Postural Changes and/or Swallow Maneuvers: Seated upright 90 degrees              Oral Care Recommendations: Oral care  BID Follow up Recommendations: Skilled Nursing facility Plan: Continue with current plan of care    GO    [facility] Medical CenterBonnie Moriyah Byington, MA CCC-SLP [phone]  Claudine MoutonDeBlois, Hero Kulish Caroline 06/11/2014, 10:14 AM

## 2014-06-11 NOTE — Consult Note (Signed)
Patient [name]      DOB: August 12, 1928      QZR:[id number]     Consult Note from the Palliative Medicine Team at [facility] Requested by: CCM     PCP: Hollace Kinnier, DO Reason for Consultation: GOC and options.   Phone Number:[phone]  Assessment of patients Current state: Brief HPI: [name] is a 80 yo female admitted with sepsis and found to have pneumonia and UTI. She required intubation on admission when found down and hypoxic and also required vasopressors. PMH significant for COPD, dementia, bipolar, blindness, dysphagia, atrial fibrillation, diastolic heart failure, renal insufficiency, right clavicle fracture (did not heal properly), right shoulder dislocation, spasmatic torticollis.   I met today with [name]'s son, [name], regarding goals of care. We discussed her decline over the past 9-12 months. [name] says that after her tracheostomy and PEG she recovered to the point of ambulating and functioning well. However, [name] believes that she has not been participating in physical therapy due to her pain level and this has led her to this weakened and debilitated state. We discussed her diagnosis of dementia and the natural progression of the disease including dysphagia, decreased appetite, weakness but he says that he is not sure he is convinced with her diagnosis of dementia. I explained how her admission last year in which she received trach might have progressed her dementia. [name] wishes for her pain to be managed to a tolerable level so that she can participate in PT. We did discuss the possibility that she may not progress even if able to participate in therapy. We discussed quality of life and further signs of progressing dementia in which it would be important to consider options. [name] was open to hearing options for DNR and comfort if she continues to decline but is not prepared to make any limitations in care at this time. I encouraged him to consider her  quality of life and that the things they are asking her to endure are going to be more beneficial than harmful to her.    Goals of Care: 1.  Code Status: FULL   2. Scope of Treatment: Continue all available and offered medical interventions.    4. Disposition: LTAC    3. Symptom Management:   1. Pain: Cymbalta 60 mg daily. Fentanyl 50 mcg IV every 2 hours severe pain.  2. Weakness: Continue medical management. LTAC for continued therapy.  3. Dysphagia: SLP following. Continue tube feedings.   4. Psychosocial: Emotional support provided to patient and family.    ROS: + pain (unable to say where)    PMH:  Past Medical History  Diagnosis Date  . COPD (chronic obstructive pulmonary disease)   . Hypertension   . Blind   . Hypothyroid   . Pacemaker   . Renal insufficiency   . Finger fracture   . Dementia   . Bipolar disorder   . Dysphagia     needs thick nectar liquids  . Atrial fibrillation   . Pneumonia   . Anxiety   . Diastolic heart failure      PSH: Past Surgical History  Procedure Laterality Date  . Insert / replace / remove pacemaker    . Tracheostomy  02/27/13    decannulated 03/29/13  . Gastrostomy tube placement  03/11/13   I have reviewed the FH and SH and  If appropriate update it with new information. No Known Allergies Scheduled Meds: . antiseptic oral rinse  15 mL  Mouth Rinse QID  . chlorhexidine  15 mL Mouth Rinse BID  . donepezil  10 mg Per Tube QHS  . DULoxetine  60 mg Oral Daily  . famotidine  20 mg Oral BID  . feeding supplement (PRO-STAT SUGAR FREE 64)  30 mL Per Tube Q24H  . feeding supplement (VITAL HIGH PROTEIN)  1,000 mL Per Tube Q24H  . heparin  5,000 Units Subcutaneous 3 times per day  . insulin aspart  0-15 Units Subcutaneous 6 times per day  . levothyroxine  112 mcg Per Tube QAC breakfast  . memantine  10 mg Oral BID  . meropenem (MERREM) IV  1 g Intravenous Q12H  . QUEtiapine  50 mg Oral QHS  . Valproic Acid  250 mg Per Tube  BID   Continuous Infusions:  PRN Meds:.sodium chloride, fentaNYL, ipratropium-albuterol, midazolam, RESOURCE THICKENUP CLEAR    BP 129/89  Pulse 82  Temp(Src) 99.2 F (37.3 C) (Core (Comment))  Resp 24  Ht $R'5\' 4"'fm$  (1.626 m)  Wt 65.4 kg (144 lb 2.9 oz)  BMI 24.74 kg/m2  SpO2 94%   PPS: 30%   Intake/Output Summary (Last 24 hours) at 06/11/14 1517 Last data filed at 06/11/14 1501  Gross per 24 hour  Intake   1720 ml  Output   1425 ml  Net    295 ml   LBM: 06/11/14                         Physical Exam:  General: NAD, lying/resting in bed HEENT: [place]/AT, no JVD, moist mucous membranes Chest: No labored breathing, symmetric, nasal cannula CVS: RRR, S1 S2 Abdomen: Soft, NT, ND Ext: BLE 1+ edema, warm to touch Neuro: Awake, alert, oriented to person only, follows commands  Labs: CBC    Component Value Date/Time   WBC 11.9* 06/11/2014 0450   RBC 3.24* 06/11/2014 0450   HGB 10.5* 06/11/2014 0450   HCT 32.9* 06/11/2014 0450   PLT 322 06/11/2014 0450   MCV 101.5* 06/11/2014 0450   MCH 32.4 06/11/2014 0450   MCHC 31.9 06/11/2014 0450   RDW 13.3 06/11/2014 0450   LYMPHSABS 2.1 06/07/2014 0055   MONOABS 0.6 06/07/2014 0055   EOSABS 0.1 06/07/2014 0055   BASOSABS 0.0 06/07/2014 0055    BMET    Component Value Date/Time   NA 146 06/11/2014 0450   K 4.2 06/11/2014 0450   CL 101 06/11/2014 0450   CO2 31 06/11/2014 0450   GLUCOSE 107* 06/11/2014 0450   BUN 26* 06/11/2014 0450   CREATININE 0.44* 06/11/2014 0450   CALCIUM 9.6 06/11/2014 0450   GFRNONAA >90 06/11/2014 0450   GFRAA >90 06/11/2014 0450    CMP     Component Value Date/Time   NA 146 06/11/2014 0450   K 4.2 06/11/2014 0450   CL 101 06/11/2014 0450   CO2 31 06/11/2014 0450   GLUCOSE 107* 06/11/2014 0450   BUN 26* 06/11/2014 0450   CREATININE 0.44* 06/11/2014 0450   CALCIUM 9.6 06/11/2014 0450   PROT 6.1 11/28/2013 1530   ALBUMIN 2.5* 11/28/2013 1530   AST 13 11/28/2013 1530   ALT 10 11/28/2013 1530   ALKPHOS 98 11/28/2013 1530    BILITOT <0.2* 11/28/2013 1530   GFRNONAA >90 06/11/2014 0450   GFRAA >90 06/11/2014 0450     Time In Time Out Total Time Spent with Patient Total Overall Time  1430 1530 29min 70min    Greater than 50%  of  this time was spent counseling and coordinating care related to the above assessment and plan.   Vinie Sill, NP Palliative Medicine Team Pager # [phone] (M-F 8a-5p) Team Phone # [phone] (Nights/Weekends)

## 2014-06-11 NOTE — Care Management Note (Signed)
    Page 1 of 1   06/12/2014     7:54:27 AM CARE MANAGEMENT NOTE 06/12/2014  [facility]  [facility]   Account Number:  [id number]  Date Initiated:  06/11/2014  Documentation initiated by:  Avie ArenasBROWN,Savita Runner  Subjective/Objective Assessment:   Found down at facility.  Resp distress - intubated     Action/Plan:   Anticipated DC Date:  06/17/2014   Anticipated DC Plan:  LONG TERM ACUTE CARE (LTAC)  In-house referral  Clinical Social Worker      DC Planning Services  CM consult      Choice offered to / List presented to:             Status of service:  Completed, signed off Medicare Important Message given?  YES (If response is "NO", the following Medicare IM given date fields will be blank) Date Medicare IM given:  06/11/2014 Medicare IM given by:  [facility]Alisha Bacus Date Additional Medicare IM given:   Additional Medicare IM given by:    Discharge Disposition:  ACUTE TO ACUTE TRANS  Per UR Regulation:  Reviewed for med. necessity/level of care/duration of stay  If discussed at Long Length of Stay Meetings, dates discussed:    Comments:  ContactShona Simpson:  Cueto,[name] Son [phone]                [facility] ClinicMondragon,Hope Daughter [phone]                Ian MalkinHaeberle,[name] Son [phone]   06-11-14 9am Avie ArenasSarah Annalicia Renfrew, RNBSN [phone] From SNF - SW consult placed.  Extubated on 7-28.  Still requiring 4 l [place].  Palliative meeting planned today.   May be Ltach candidate depending on plan from meeting this pm. Talked to physician - Ltach consult placed - referrals made.  [facility] is not offering a bed.  [facility]waiting [facility] decision.  Physician updated. [facility] offering bed today.  Will wait till palliative meeting completed before talking with son. Family would like everything done - has been in [facility] prior.  Would like to go back.  Plan for tx to [facility] today.

## 2014-06-11 NOTE — Progress Notes (Signed)
Report called to Harrington ChallengerLacie Clifton, RN. Assessment, IV access, foley, g-tube, activity, and further discharge orders reviewed. Receiving unit ready for transfer. 06/11/2014 5:10 PM Eileen Stanfordarter, Kaisley Stiverson

## 2014-06-12 ENCOUNTER — Other Ambulatory Visit (HOSPITAL_COMMUNITY): Payer: Self-pay

## 2014-06-12 LAB — COMPREHENSIVE METABOLIC PANEL
ALK PHOS: 71 U/L (ref 39–117)
ALT: 14 U/L (ref 0–35)
AST: 22 U/L (ref 0–37)
Albumin: 2.4 g/dL — ABNORMAL LOW (ref 3.5–5.2)
Anion gap: 10 (ref 5–15)
BUN: 24 mg/dL — ABNORMAL HIGH (ref 6–23)
CHLORIDE: 104 meq/L (ref 96–112)
CO2: 33 meq/L — AB (ref 19–32)
Calcium: 9.2 mg/dL (ref 8.4–10.5)
Creatinine, Ser: 0.42 mg/dL — ABNORMAL LOW (ref 0.50–1.10)
GFR calc Af Amer: >90 mL/min (ref >90)
Glucose, Bld: 86 mg/dL (ref 70–99)
Potassium: 3.8 mEq/L (ref 3.7–5.3)
SODIUM: 147 meq/L (ref 137–147)
Total Protein: 6.6 g/dL (ref 6.0–8.3)

## 2014-06-12 LAB — BLOOD GAS, ARTERIAL
Acid-Base Excess: 7.7 mmol/L — ABNORMAL HIGH (ref 0.0–2.0)
BICARBONATE: 31.3 meq/L — AB (ref 20.0–24.0)
O2 CONTENT: 3 L/min
O2 SAT: 92.9 %
PATIENT TEMPERATURE: 98.6
TCO2: 32.5 mmol/L (ref 0–100)
pCO2 arterial: 40.4 mmHg (ref 35.0–45.0)
pH, Arterial: 7.5 — ABNORMAL HIGH (ref 7.350–7.450)
pO2, Arterial: 62.2 mmHg — ABNORMAL LOW (ref 80.0–100.0)

## 2014-06-12 LAB — CBC WITH DIFFERENTIAL/PLATELET
Basophils Absolute: 0.1 10*3/uL (ref 0.0–0.1)
Basophils Relative: 1 % (ref 0–1)
EOS PCT: 4 % (ref 0–5)
Eosinophils Absolute: 0.4 10*3/uL (ref 0.0–0.7)
HEMATOCRIT: 32.1 % — AB (ref 36.0–46.0)
Hemoglobin: 10.4 g/dL — ABNORMAL LOW (ref 12.0–15.0)
LYMPHS PCT: 25 % (ref 12–46)
Lymphs Abs: 2.5 10*3/uL (ref 0.7–4.0)
MCH: 33.4 pg (ref 26.0–34.0)
MCHC: 32.4 g/dL (ref 30.0–36.0)
MCV: 103.2 fL — ABNORMAL HIGH (ref 78.0–100.0)
Monocytes Absolute: 0.8 10*3/uL (ref 0.1–1.0)
Monocytes Relative: 8 % (ref 3–12)
Neutro Abs: 6.5 10*3/uL (ref 1.7–7.7)
Neutrophils Relative %: 64 % (ref 43–77)
PLATELETS: 293 10*3/uL (ref 150–400)
RBC: 3.11 MIL/uL — AB (ref 3.87–5.11)
RDW: 13.5 % (ref 11.5–15.5)
WBC: 10.2 10*3/uL (ref 4.0–10.5)

## 2014-06-12 LAB — TSH: TSH: 1.47 u[IU]/mL (ref 0.350–4.500)

## 2014-06-12 LAB — PREALBUMIN: PREALBUMIN: 20.5 mg/dL (ref 17.0–34.0)

## 2014-06-12 NOTE — Progress Notes (Addendum)
[facility]                                                                                              Progress note     Patient Demographics  [name], is a 80 y.o. female  ZOX:[id number]  WJX:[id number]  DOB - 07/06/1928  Admit date - 06/11/2014  Admitting Physician Carron Curie, MD  Outpatient Primary MD for the patient is [name], [name], DO  LOS - 1   No chief complaint on file.          Subjective:   [name] is confused and can't give any history  Objective:   Vital signs  Temperature 97.9 Heart rate 102 Respiratory rate 18 Blood pressure 99/69 Pulse ox 95%    Exam Confused with flat affect Legally Blind with left pupil bigger than right Supple Neck,No JVD, No cervical lymphadenopathy appriciated.  Symmetrical Chest wall movement, poor inspiratory effort with bilateral basilar crackles RRR,No Gallops,Rubs or new Murmurs, No Parasternal Heave +ve B.Sounds, Abd Soft, Non tender, No organomegaly appriciated, No rebound - guarding or rigidity. Peg tube in place No Cyanosis, Clubbing positive for,    I&Os +308   Data Review   CBC  Recent Labs Lab 06/07/14 0055  06/08/14 0352 06/09/14 0345 06/10/14 0400 06/11/14 0450 06/12/14 0500  WBC 12.0*  < > 9.5 10.6* 11.6* 11.9* 10.2  HGB 11.9*  < > 10.2* 9.5* 9.9* 10.5* 10.4*  HCT 36.6  < > 31.2* 29.5* 31.0* 32.9* 32.1*  PLT 289  < > 242 249 315 322 293  MCV 101.7*  < > 103.3* 101.0* 101.6* 101.5* 103.2*  MCH 33.1  < > 33.8 32.5 32.5 32.4 33.4  MCHC 32.5  < > 32.7 32.2 31.9 31.9 32.4  RDW 13.2  < > 13.4 13.6 13.7 13.3 13.5  LYMPHSABS 2.1  --   --   --   --   --  2.5  MONOABS 0.6  --   --   --   --   --  0.8  EOSABS 0.1  --   --   --   --   --  0.4  BASOSABS 0.0  --   --   --   --   --  0.1  < > = values in this interval not displayed.  Chemistries   Recent Labs Lab 06/07/14 1045 06/08/14 0352  06/09/14 0345 06/10/14 0400 06/11/14 0450 06/12/14 0500  NA  --  147 143 147 146 147  K  --  4.9 4.9 3.6* 4.2 3.8  CL  --  114* 107 103 101 104  CO2  --  24 26 31 31  33*  GLUCOSE  --  124* 101* 113* 107* 86  BUN  --  39* 28* 29* 26* 24*  CREATININE  --  0.51 0.41* 0.58 0.44* 0.42*  CALCIUM  --  8.7 8.9 9.2 9.6 9.2  MG 1.7  --   --  1.9 1.8  --   AST  --   --   --   --   --  22  ALT  --   --   --   --   --  14  ALKPHOS  --   --   --   --   --  71  BILITOT  --   --   --   --   --  <0.2*   ------------------------------------------------------------------------------------------------------------------ CrCl is unknown because both a height and weight (above a minimum accepted value) are required for this calculation. ------------------------------------------------------------------------------------------------------------------ No results found for this basename: HGBA1C,  in the last 72 hours ------------------------------------------------------------------------------------------------------------------  Recent Labs  06/10/14 0400  TRIG 93   ------------------------------------------------------------------------------------------------------------------  Recent Labs  06/12/14 0500  TSH 1.470   ------------------------------------------------------------------------------------------------------------------ No results found for this basename: VITAMINB12, FOLATE, FERRITIN, TIBC, IRON, RETICCTPCT,  in the last 72 hours  Coagulation profile No results found for this basename: INR, PROTIME,  in the last 168 hours  No results found for this basename: DDIMER,  in the last 72 hours  Cardiac Enzymes  Recent Labs Lab 06/07/14 0055  TROPONINI <0.30   ------------------------------------------------------------------------------------------------------------------ No components found with this basename: POCBNP,   Micro Results Recent Results (from the past 240 hour(s))   CULTURE, BLOOD (ROUTINE X 2)     Status: None   Collection Time    06/07/14  1:15 AM      Result Value Ref Range Status   Specimen Description BLOOD LEFT HAND   Final   Special Requests BOTTLES DRAWN AEROBIC AND ANAEROBIC 10CC   Final   Culture  Setup Time     Final   Value: 06/07/2014 14:53     Performed at [facility]   Culture     Final   Value:        BLOOD CULTURE RECEIVED NO GROWTH TO DATE CULTURE WILL BE HELD FOR 5 DAYS BEFORE ISSUING A FINAL NEGATIVE REPORT     Performed at [facility]   Report Status PENDING   Incomplete  CULTURE, BLOOD (ROUTINE X 2)     Status: None   Collection Time    06/07/14  1:20 AM      Result Value Ref Range Status   Specimen Description BLOOD RIGHT HAND   Final   Special Requests BOTTLES DRAWN AEROBIC ONLY 10CC   Final   Culture  Setup Time     Final   Value: 06/07/2014 14:53     Performed at [facility]   Culture     Final   Value:        BLOOD CULTURE RECEIVED NO GROWTH TO DATE CULTURE WILL BE HELD FOR 5 DAYS BEFORE ISSUING A FINAL NEGATIVE REPORT     Performed at [facility]   Report Status PENDING   Incomplete  CULTURE, RESPIRATORY (NON-EXPECTORATED)     Status: None   Collection Time    06/07/14  1:53 AM      Result Value Ref Range Status   Specimen Description TRACHEAL ASPIRATE   Final   Special Requests NONE   Final   Gram Stain     Final   Value: MODERATE WBC PRESENT, PREDOMINANTLY PMN     FEW SQUAMOUS EPITHELIAL CELLS PRESENT     MODERATE GRAM POSITIVE COCCI IN PAIRS     MODERATE GRAM NEGATIVE RODS     FEW GRAM POSITIVE RODS   Culture     Final   Value: Non-Pathogenic Oropharyngeal-type Flora Isolated.     Performed at [facility]   Report Status 06/09/2014 FINAL   Final  URINE CULTURE     Status: None   Collection Time  06/07/14  3:08 AM      Result Value Ref Range Status   Specimen Description URINE, CATHETERIZED   Final   Special Requests NONE   Final   Culture  Setup Time      Final   Value: 06/07/2014 15:50     [facility]formed [facility]   Colony Count     Final   Value: >=100,000 COLONIES/ML     [facility]   Culture     Final   Value: ESCHERICHIA COLI     Note: Confirmed Extended Spectrum Beta-Lactamase Producer (ESBL) CRITICAL RESULT CALLED TO, READ BACK BY AND VERIFIED WITH: Fontaine NoERRANCE JOHNSON @ 11:25AM 06/09/14 BY DWEEKS     Performed at [facility]   Report Status 06/09/2014 FINAL   Final   Organism ID, Bacteria ESCHERICHIA COLI   Final  MRSA PCR SCREENING     Status: None   Collection Time    06/07/14  5:05 AM      Result Value Ref Range Status   MRSA by PCR NEGATIVE  NEGATIVE Final   Comment:            The GeneXpert MRSA Assay (FDA     approved for NASAL specimens     only), is one component of a     comprehensive MRSA colonization     surveillance program. It is not     intended to diagnose MRSA     infection nor to guide or     monitor treatment for     MRSA infections.       Assessment & Plan   Respiratory failure status post extubation on 3 L nasal cannula combination ofHCAP, Diastolic congestive heart failure and COPD UTI with ESBL on meropenem Septic shock resolved Encephalopathy/dementia/bipolar disorder on multiple psychotropic medications Diastolic congestive heart failure continue with diuresis COPD continue with nebulizer treatments Dysphagia/BCM continue with G-tube feeding and by mouth dysphagia diet Healthcare associated pneumonia and meropenem Hyponatremia Acute kidney injury-resolved Generalized weakness; PT OT to evaluate Diabetes mellitus on insulin sliding scale Chronic pain we'll consult Dr. Welton FlakesKhan   plan  We'll give extra dose of Lasix IV today Will discuss with family about CODE STATUS Poor prognosis  Code Status: Full  DVT Prophylaxis  heparin   Carron CurieHijazi, Dakhari Zuver M.D on 06/12/2014 at 11:11 AM

## 2014-06-13 LAB — CULTURE, BLOOD (ROUTINE X 2)
Culture: NO GROWTH
Culture: NO GROWTH

## 2014-06-13 NOTE — Progress Notes (Signed)
[facility]                                                                                              Progress note     Patient Demographics  [name], is a 80 y.o. female  ZOX:[id number]  [id number]  DOB - 1928/09/12  Admit date - 06/11/2014  Admitting Physician Carron Curie, MD  Outpatient Primary MD for the patient is [name], [name], DO  LOS - 2   No chief complaint on file.          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  Symmetrical Chest wall movement, poor inspiratory effort with bilateral basilar crackles RRR,No Gallops,Rubs or new Murmurs, No Parasternal Heave +ve B.Sounds, Abd Soft, Non tender, No organomegaly appriciated, No rebound - guarding or rigidity. Peg tube in place No Cyanosis, Clubbing positive for,    I&Os 391   Data Review   CBC  Recent Labs Lab 06/07/14 0055  06/08/14 0352 06/09/14 0345 06/10/14 0400 06/11/14 0450 06/12/14 0500  WBC 12.0*  < > 9.5 10.6* 11.6* 11.9* 10.2  HGB 11.9*  < > 10.2* 9.5* 9.9* 10.5* 10.4*  HCT 36.6  < > 31.2* 29.5* 31.0* 32.9* 32.1*  PLT 289  < > 242 249 315 322 293  MCV 101.7*  < > 103.3* 101.0* 101.6* 101.5* 103.2*  MCH 33.1  < > 33.8 32.5 32.5 32.4 33.4  MCHC 32.5  < > 32.7 32.2 31.9 31.9 32.4  RDW 13.2  < > 13.4 13.6 13.7 13.3 13.5  LYMPHSABS 2.1  --   --   --   --   --  2.5  MONOABS 0.6  --   --   --   --   --  0.8  EOSABS 0.1  --   --   --   --   --  0.4  BASOSABS 0.0  --   --   --   --   --  0.1  < > = values in this interval not displayed.  Chemistries   Recent Labs Lab 06/07/14 1045 06/08/14 0352  06/09/14 0345 06/10/14 0400 06/11/14 0450 06/12/14 0500  NA  --  147 143 147 146 147  K  --  4.9 4.9 3.6* 4.2 3.8  CL  --  114* 107 103 101 104  CO2  --  24 26 31 31  33*  GLUCOSE  --  124* 101* 113* 107* 86  BUN  --  39* 28* 29* 26* 24*  CREATININE  --  0.51 0.41* 0.58 0.44* 0.42*  CALCIUM  --  8.7 8.9 9.2 9.6 9.2  MG 1.7  --   --  1.9 1.8  --   AST  --   --   --   --   --  22  ALT  --   --   --   --   --  14  ALKPHOS  --   --   --   --   --  71  BILITOT  --   --   --   --   --  <0.2*   ------------------------------------------------------------------------------------------------------------------ CrCl is unknown because both a height and weight (above a minimum accepted value) are required for this calculation. ------------------------------------------------------------------------------------------------------------------ No results found for this basename: HGBA1C,  in the last 72 hours ------------------------------------------------------------------------------------------------------------------ No results found for this basename: CHOL, HDL, LDLCALC, TRIG, CHOLHDL, LDLDIRECT,  in the last 72 hours ------------------------------------------------------------------------------------------------------------------  Recent Labs  06/12/14 0500  TSH 1.470   ------------------------------------------------------------------------------------------------------------------ No results found for this basename: VITAMINB12, FOLATE, FERRITIN, TIBC, IRON, RETICCTPCT,  in the last 72 hours  Coagulation profile No results found for this basename: INR, PROTIME,  in the last 168 hours  No results found for this basename: DDIMER,  in the last 72 hours  Cardiac Enzymes  Recent Labs Lab 06/07/14 0055  TROPONINI <0.30   ------------------------------------------------------------------------------------------------------------------ No components found with this basename: POCBNP,    Micro Results Recent Results (from the past 240 hour(s))  CULTURE, BLOOD (ROUTINE X 2)     Status: None   Collection Time    06/07/14  1:15 AM      Result Value Ref Range Status   Specimen Description BLOOD LEFT HAND   Final   Special Requests BOTTLES DRAWN AEROBIC AND ANAEROBIC 10CC   Final   Culture  Setup Time     Final   Value: 06/07/2014 14:53     Performed at [facility]   Culture     Final   Value: NO GROWTH 5 DAYS     Performed at [facility]   Report Status 06/13/2014 FINAL   Final  CULTURE, BLOOD (ROUTINE X 2)     Status: None   Collection Time    06/07/14  1:20 AM      Result Value Ref Range Status   Specimen Description BLOOD RIGHT HAND   Final   Special Requests BOTTLES DRAWN AEROBIC ONLY 10CC   Final   Culture  Setup Time     Final   Value: 06/07/2014 14:53     Performed at [facility]   Culture     Final   Value: NO GROWTH 5 DAYS     Performed at [facility]   Report Status 06/13/2014 FINAL   Final  CULTURE, RESPIRATORY (NON-EXPECTORATED)     Status: None   Collection Time    06/07/14  1:53 AM      Result Value Ref Range Status   Specimen Description TRACHEAL ASPIRATE   Final   Special Requests NONE   Final   Gram Stain     Final   Value: MODERATE WBC PRESENT, PREDOMINANTLY PMN     FEW SQUAMOUS EPITHELIAL CELLS PRESENT     MODERATE GRAM POSITIVE COCCI IN PAIRS     MODERATE GRAM NEGATIVE RODS     FEW GRAM POSITIVE RODS   Culture     Final   Value: Non-Pathogenic Oropharyngeal-type Flora Isolated.     Performed at [facility]   Report Status 06/09/2014 FINAL   Final  URINE CULTURE     Status: None   Collection Time    06/07/14  3:08 AM      Result Value Ref Range Status   Specimen Description URINE, CATHETERIZED   Final   Special Requests NONE   Final   Culture  Setup  Time     Final   Value: 06/07/2014 15:50     Performed at [facility]   Colony Count     Final   Value: >=100,000 COLONIES/ML      [facility]   Culture     Final   Value: ESCHERICHIA COLI     Note: Confirmed Extended Spectrum Beta-Lactamase Producer (ESBL) CRITICAL RESULT CALLED TO, READ BACK BY AND VERIFIED WITH: Fontaine NoERRANCE JOHNSON @ 11:25AM 06/09/14 BY DWEEKS     Performed at [facility]   Report Status 06/09/2014 FINAL   Final   Organism ID, Bacteria ESCHERICHIA COLI   Final  MRSA PCR SCREENING     Status: None   Collection Time    06/07/14  5:05 AM      Result Value Ref Range Status   MRSA by PCR NEGATIVE  NEGATIVE Final   Comment:            The GeneXpert MRSA Assay (FDA     approved for NASAL specimens     only), is one component of a     comprehensive MRSA colonization     surveillance program. It is not     intended to diagnose MRSA     infection nor to guide or     monitor treatment for     MRSA infections.       Assessment & Plan   Respiratory failure status post extubation on 3 L nasal cannula combination ofHCAP, Diastolic congestive heart failure and COPD UTI with ESBL on meropenem Septic shock resolved Encephalopathy/dementia/bipolar disorder on multiple psychotropic medications Diastolic congestive heart failure continue with diuresis COPD continue with nebulizer treatments Dysphagia/BCM continue with G-tube feeding and by mouth dysphagia diet Healthcare associated pneumonia and meropenem Hyponatremia Acute kidney injury-resolved Generalized weakness; PT OT to evaluate Diabetes mellitus on insulin sliding scale Chronic pain we'll consult Dr. Welton FlakesKhan   plan  Check labs in a.m.  Code Status: Full  DVT Prophylaxis  heparin   Carron CurieHijazi, Andoni Busch M.D on 06/13/2014 at 11:26 AM

## 2014-06-14 LAB — BASIC METABOLIC PANEL
Anion gap: 11 (ref 5–15)
BUN: 25 mg/dL — ABNORMAL HIGH (ref 6–23)
CALCIUM: 9.3 mg/dL (ref 8.4–10.5)
CO2: 34 mEq/L — ABNORMAL HIGH (ref 19–32)
CREATININE: 0.52 mg/dL (ref 0.50–1.10)
Chloride: 97 mEq/L (ref 96–112)
GFR, EST NON AFRICAN AMERICAN: 85 mL/min — AB (ref >90)
Glucose, Bld: 79 mg/dL (ref 70–99)
Potassium: 4.2 mEq/L (ref 3.7–5.3)
Sodium: 142 mEq/L (ref 137–147)

## 2014-06-16 LAB — VALPROIC ACID LEVEL

## 2014-06-16 NOTE — Progress Notes (Signed)
[facility]                                                                                              Progress note     Patient Demographics  [name], is a 80 y.o. female  [id number]  [id number]  DOB - 11/28/1927  Admit date - 06/11/2014  Admitting Physician Carron Curie, MD  Outpatient Primary MD for the patient is [name], [name], DO  LOS - 5   No chief complaint on file.          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  Symmetrical Chest wall movement, poor inspiratory effort with bilateral basilar crackles RRR,No Gallops,Rubs or new Murmurs, No Parasternal Heave +ve B.Sounds, Abd Soft, Non tender, No organomegaly appriciated, No rebound - guarding or rigidity. Peg tube in place No Cyanosis, Clubbing positive for,    I&Os -223   Data Review   CBC  Recent Labs Lab 06/10/14 0400 06/11/14 0450 06/12/14 0500  WBC 11.6* 11.9* 10.2  HGB 9.9* 10.5* 10.4*  HCT 31.0* 32.9* 32.1*  PLT 315 322 293  MCV 101.6* 101.5* 103.2*  MCH 32.5 32.4 33.4  MCHC 31.9 31.9 32.4  RDW 13.7 13.3 13.5  LYMPHSABS  --   --  2.5  MONOABS  --   --  0.8  EOSABS  --   --  0.4  BASOSABS  --   --  0.1    Chemistries   Recent Labs Lab 06/10/14 0400 06/11/14 0450 06/12/14 0500 06/14/14 0630  NA 147 146 147 142  K 3.6* 4.2 3.8 4.2  CL 103 101 104 97  CO2 31 31 33* 34*  GLUCOSE 113* 107* 86 79  BUN 29* 26* 24* 25*  CREATININE 0.58 0.44* 0.42* 0.52  CALCIUM 9.2 9.6 9.2 9.3  MG 1.9 1.8  --   --   AST  --   --  22  --   ALT  --   --  14  --   ALKPHOS  --   --  71  --   BILITOT  --   --   <0.2*  --    ------------------------------------------------------------------------------------------------------------------ CrCl is unknown because both a height and weight (above a minimum accepted value) are required for this calculation. ------------------------------------------------------------------------------------------------------------------ No results found for this basename: HGBA1C,  in the last 72 hours ------------------------------------------------------------------------------------------------------------------ No results found for this basename: CHOL, HDL, LDLCALC, TRIG, CHOLHDL, LDLDIRECT,  in the last 72 hours ------------------------------------------------------------------------------------------------------------------ No results found for this basename: TSH, T4TOTAL, FREET3, T3FREE, THYROIDAB,  in the last 72 hours ------------------------------------------------------------------------------------------------------------------ No results found for this basename: VITAMINB12, FOLATE, FERRITIN, TIBC, IRON, RETICCTPCT,  in the last 72 hours  Coagulation profile No results found for this basename: INR, PROTIME,  in the last 168 hours  No results found for this basename:  DDIMER,  in the last 72 hours  Cardiac Enzymes No results found for this basename: CK, CKMB, TROPONINI, MYOGLOBIN,  in the last 168 hours ------------------------------------------------------------------------------------------------------------------ No components found with this basename: POCBNP,   Micro Results Recent Results (from the past 240 hour(s))  CULTURE, BLOOD (ROUTINE X 2)     Status: None   Collection Time    06/07/14  1:15 AM      Result Value Ref Range Status   Specimen Description BLOOD LEFT HAND   Final   Special Requests BOTTLES DRAWN AEROBIC AND ANAEROBIC 10CC   Final   Culture  Setup Time     Final   Value: 06/07/2014 14:53     Performed at [facility]    Culture     Final   Value: NO GROWTH 5 DAYS     Performed at [facility]   Report Status 06/13/2014 FINAL   Final  CULTURE, BLOOD (ROUTINE X 2)     Status: None   Collection Time    06/07/14  1:20 AM      Result Value Ref Range Status   Specimen Description BLOOD RIGHT HAND   Final   Special Requests BOTTLES DRAWN AEROBIC ONLY 10CC   Final   Culture  Setup Time     Final   Value: 06/07/2014 14:53     Performed at [facility]   Culture     Final   Value: NO GROWTH 5 DAYS     Performed at [facility]   Report Status 06/13/2014 FINAL   Final  CULTURE, RESPIRATORY (NON-EXPECTORATED)     Status: None   Collection Time    06/07/14  1:53 AM      Result Value Ref Range Status   Specimen Description TRACHEAL ASPIRATE   Final   Special Requests NONE   Final   Gram Stain     Final   Value: MODERATE WBC PRESENT, PREDOMINANTLY PMN     FEW SQUAMOUS EPITHELIAL CELLS PRESENT     MODERATE GRAM POSITIVE COCCI IN PAIRS     MODERATE GRAM NEGATIVE RODS     FEW GRAM POSITIVE RODS   Culture     Final   Value: Non-Pathogenic Oropharyngeal-type Flora Isolated.     Performed at [facility]   Report Status 06/09/2014 FINAL   Final  URINE CULTURE     Status: None   Collection Time    06/07/14  3:08 AM      Result Value Ref Range Status   Specimen Description URINE, CATHETERIZED   Final   Special Requests NONE   Final   Culture  Setup Time     Final   Value: 06/07/2014 15:50     Performed at [facility] Count     Final   Value: >=100,000 COLONIES/ML     Performed at [facility]   Culture     Final   Value: ESCHERICHIA COLI     Note: Confirmed Extended Spectrum Beta-Lactamase Producer (ESBL) CRITICAL RESULT CALLED TO, READ BACK BY AND VERIFIED WITH: Fontaine No @ 11:25AM 06/09/14 BY DWEEKS     Performed at [facility]   Report Status 06/09/2014 FINAL   Final   Organism ID, Bacteria ESCHERICHIA COLI   Final  MRSA PCR  SCREENING     Status: None   Collection Time    06/07/14  5:05 AM      Result Value Ref  Range Status   MRSA by PCR NEGATIVE  NEGATIVE Final   Comment:            The GeneXpert MRSA Assay (FDA     approved for NASAL specimens     only), is one component of a     comprehensive MRSA colonization     surveillance program. It is not     intended to diagnose MRSA     infection nor to guide or     monitor treatment for     MRSA infections.       Assessment & Plan   Respiratory failure status post extubation on 3-4 L nasal cannula combination ofHCAP, Diastolic congestive heart failure and COPD UTI with ESBL on meropenem Septic shock resolved Encephalopathy/dementia/bipolar disorder on multiple psychotropic medications: Haldol, Seroquel and Depakote Diastolic congestive heart failure continue with diuresis COPD continue with nebulizer treatments Dysphagia/BCM continue with G-tube feeding and by mouth dysphagia diet Healthcare associated pneumonia and meropenem Hyponatremia Acute kidney injury-resolved Generalized weakness; PT OT to evaluate Diabetes mellitus on insulin sliding scale Chronic pain we'll consult Dr. Welton FlakesKhan   plan   Increase Depakote to 500 mg by mouth twice a day  Code Status: Full  DVT Prophylaxis  heparin   Carron CurieHijazi, Anaja Monts M.D on 06/16/2014 at 10:18 AM

## 2014-06-17 NOTE — Progress Notes (Signed)
[facility]                                                                                              Progress note     Patient Demographics  [name], is a 80 y.o. female  [id number]:[id number]  [id number]  DOB - March 11, 1928  Admit date - 06/11/2014  Admitting Physician Carron Curie, MD  Outpatient Primary MD for the patient is [name], [name], DO  LOS - 6   No chief complaint on file.          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  Symmetrical Chest wall movement, poor inspiratory effort with bilateral basilar crackles RRR,No Gallops,Rubs or new Murmurs, No Parasternal Heave +ve B.Sounds, Abd Soft, Non tender, No organomegaly appriciated, No rebound - guarding or rigidity. Peg tube in place No Cyanosis, Clubbing positive for,    I&Os -73   Data Review   CBC  Recent Labs Lab 06/11/14 0450 06/12/14 0500  WBC 11.9* 10.2  HGB 10.5* 10.4*  HCT 32.9* 32.1*  PLT 322 293  MCV 101.5* 103.2*  MCH 32.4 33.4  MCHC 31.9 32.4  RDW 13.3 13.5  LYMPHSABS  --  2.5  MONOABS  --  0.8  EOSABS  --  0.4  BASOSABS  --  0.1    Chemistries   Recent Labs Lab 06/11/14 0450 06/12/14 0500 06/14/14 0630  NA 146 147 142  K 4.2 3.8 4.2  CL 101 104 97  CO2 31 33* 34*  GLUCOSE 107* 86 79  BUN 26* 24* 25*  CREATININE 0.44* 0.42* 0.52  CALCIUM 9.6 9.2 9.3  MG 1.8  --   --   AST  --  22  --   ALT  --  14  --   ALKPHOS  --  71  --   BILITOT  --  <0.2*  --    ------------------------------------------------------------------------------------------------------------------ CrCl is unknown because  both a height and weight (above a minimum accepted value) are required for this calculation. ------------------------------------------------------------------------------------------------------------------ No results found for this basename: HGBA1C,  in the last 72 hours ------------------------------------------------------------------------------------------------------------------ No results found for this basename: CHOL, HDL, LDLCALC, TRIG, CHOLHDL, LDLDIRECT,  in the last 72 hours ------------------------------------------------------------------------------------------------------------------ No results found for this basename: TSH, T4TOTAL, FREET3, T3FREE, THYROIDAB,  in the last 72 hours ------------------------------------------------------------------------------------------------------------------ No results found for this basename: VITAMINB12, FOLATE, FERRITIN, TIBC, IRON, RETICCTPCT,  in the last 72 hours  Coagulation profile No results found for this basename: INR, PROTIME,  in the last 168 hours  No results found for this basename: DDIMER,  in the last 72 hours  Cardiac Enzymes No results found for this basename: CK, CKMB, TROPONINI, MYOGLOBIN,  in the last 168 hours ------------------------------------------------------------------------------------------------------------------ No components found with this basename: POCBNP,   Micro Results No results found for this or any  previous visit (from the past 240 hour(s)).     Assessment & Plan   Respiratory failure status post extubation on 3-4 L nasal cannula combination ofHCAP, Diastolic congestive heart failure and COPD UTI with ESBL on IV antibiotics until 8/8 Septic shock resolved Encephalopathy/dementia/bipolar disorder on multiple psychotropic medications: Haldol, Seroquel and Depakote Diastolic congestive heart failure continue with diuresis COPD continue with nebulizer treatments Dysphagia/BCM continue with  G-tube feeding and by mouth dysphagia diet Healthcare associated pneumonia on IV antibiotics Hypernatremia Acute kidney injury-resolved Generalized weakness; PT OT to evaluate Diabetes mellitus on insulin sliding scale Chronic pain followed by Dr. Welton FlakesKhan   plan   Discussed with the family today including the daughter who is a nurse and the son was the power of attorney they wanted to continue to be full code. Critical care time 33 minutes  Code Status: Full  DVT Prophylaxis  heparin   Carron CurieHijazi, Fender Herder M.D on 06/17/2014 at 11:17 AM

## 2014-06-18 NOTE — Progress Notes (Signed)
[facility]                                                                                              Progress note     Patient Demographics  [name], is a 80 y.o. female  [id number]  [id number]  DOB - 04-09-1928  Admit date - 06/11/2014  Admitting Physician Melanie Curie, MD  Outpatient Primary MD for the patient is [name], [name], DO  LOS - 7   No chief complaint on file.          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  Symmetrical Chest wall movement, poor inspiratory effort with bilateral basilar crackles RRR,No Gallops,Rubs or new Murmurs, No Parasternal Heave +ve B.Sounds, Abd Soft, Non tender, No organomegaly appriciated, No rebound - guarding or rigidity. Peg tube in place No Cyanosis, Clubbing positive for,    I&Os 2396/2200   Data Review   CBC  Recent Labs Lab 06/12/14 0500  WBC 10.2  HGB 10.4*  HCT 32.1*  PLT 293  MCV 103.2*  MCH 33.4  MCHC 32.4  RDW 13.5  LYMPHSABS 2.5  MONOABS 0.8  EOSABS 0.4  BASOSABS 0.1    Chemistries   Recent Labs Lab 06/12/14 0500 06/14/14 0630  NA 147 142  K 3.8 4.2  CL 104 97  CO2 33* 34*  GLUCOSE 86 79  BUN 24* 25*  CREATININE 0.42* 0.52  CALCIUM 9.2 9.3  AST 22  --   ALT 14  --   ALKPHOS 71  --   BILITOT <0.2*  --    ------------------------------------------------------------------------------------------------------------------ CrCl is unknown because both a height and weight (above a minimum accepted value) are required for this  calculation. ------------------------------------------------------------------------------------------------------------------ No results found for this basename: HGBA1C,  in the last 72 hours ------------------------------------------------------------------------------------------------------------------ No results found for this basename: CHOL, HDL, LDLCALC, TRIG, CHOLHDL, LDLDIRECT,  in the last 72 hours ------------------------------------------------------------------------------------------------------------------ No results found for this basename: TSH, T4TOTAL, FREET3, T3FREE, THYROIDAB,  in the last 72 hours ------------------------------------------------------------------------------------------------------------------ No results found for this basename: VITAMINB12, FOLATE, FERRITIN, TIBC, IRON, RETICCTPCT,  in the last 72 hours  Coagulation profile No results found for this basename: INR, PROTIME,  in the last 168 hours  No results found for this basename: DDIMER,  in the last 72 hours  Cardiac Enzymes No results found for this basename: CK, CKMB, TROPONINI, MYOGLOBIN,  in the last 168 hours ------------------------------------------------------------------------------------------------------------------ No components found with this basename: POCBNP,   Micro Results No results found for this or any previous visit (from the past 240 hour(s)).     Assessment & Plan   Respiratory failure status post extubation on 3-4 L nasal cannula combination ofHCAP, Diastolic congestive heart failure and COPD UTI with ESBL on IV antibiotics until 8/8 Septic shock resolved Encephalopathy/dementia/bipolar disorder on multiple psychotropic medications: Haldol, Seroquel  and Depakote Diastolic congestive heart failure continue with diuresis COPD continue with nebulizer treatments Dysphagia/BCM continue with G-tube feeding and by mouth dysphagia diet Healthcare associated pneumonia on IV  antibiotics Hypernatremia Acute kidney injury-resolved Generalized weakness; PT OT to evaluate Diabetes mellitus on insulin sliding scale Chronic pain followed by Dr. Welton FlakesKhan   plan   Continue same medications Check labs in a.m. Decrease Lasix to 20 mg daily  Code Status: Full  DVT Prophylaxis  heparin   Melanie CurieHijazi, Sapir Cummings M.D on 06/18/2014 at 11:27 AM

## 2014-06-19 LAB — BASIC METABOLIC PANEL
Anion gap: 10 (ref 5–15)
BUN: 35 mg/dL — ABNORMAL HIGH (ref 6–23)
CALCIUM: 9.2 mg/dL (ref 8.4–10.5)
CO2: 32 mEq/L (ref 19–32)
Chloride: 99 mEq/L (ref 96–112)
Creatinine, Ser: 0.64 mg/dL (ref 0.50–1.10)
GFR, EST NON AFRICAN AMERICAN: 80 mL/min — AB (ref >90)
GLUCOSE: 120 mg/dL — AB (ref 70–99)
POTASSIUM: 4.6 meq/L (ref 3.7–5.3)
SODIUM: 141 meq/L (ref 137–147)

## 2014-06-19 LAB — CBC
HCT: 31.9 % — ABNORMAL LOW (ref 36.0–46.0)
HEMOGLOBIN: 10 g/dL — AB (ref 12.0–15.0)
MCH: 32.7 pg (ref 26.0–34.0)
MCHC: 31.3 g/dL (ref 30.0–36.0)
MCV: 104.2 fL — ABNORMAL HIGH (ref 78.0–100.0)
PLATELETS: 313 10*3/uL (ref 150–400)
RBC: 3.06 MIL/uL — ABNORMAL LOW (ref 3.87–5.11)
RDW: 14.9 % (ref 11.5–15.5)
WBC: 9.1 10*3/uL (ref 4.0–10.5)

## 2014-06-19 NOTE — Progress Notes (Signed)
[facility]                                                                                              Progress note     Patient Demographics  [name], is a 80 y.o. female  ZOX:[id number]  [id number]:[id number]  DOB - July 30, 1928  Admit date - 06/11/2014  Admitting Physician Carron Curie, MD  Outpatient Primary MD for the patient is [name], [name], DO  LOS - 8   No chief complaint on file.          Subjective:   [name] is confused and can't give any history  Objective:   Vital signs  Temperature 98 Heart rate 51 Respiratory rate 19 Blood pressure 109/72 Pulse ox 94%    Exam Confused with flat affect Legally Blind with left pupil bigger than right Supple Neck,No JVD, No cervical lymphadenopathy appriciated.  Symmetrical Chest wall movement, poor inspiratory effort with bilateral basilar crackles RRR,No Gallops,Rubs or new Murmurs, No Parasternal Heave +ve B.Sounds, Abd Soft, Non tender, No organomegaly appriciated, No rebound - guarding or rigidity. Peg tube in place No Cyanosis, Clubbing positive for,    I&Os 1295/1775   Data Review   CBC  Recent Labs Lab 06/19/14 0520  WBC 9.1  HGB 10.0*  HCT 31.9*  PLT 313  MCV 104.2*  MCH 32.7  MCHC 31.3  RDW 14.9    Chemistries   Recent Labs Lab 06/14/14 0630 06/19/14 0520  NA 142 141  K 4.2 4.6  CL 97 99  CO2 34* 32  GLUCOSE 79 120*  BUN 25* 35*  CREATININE 0.52 0.64  CALCIUM 9.3 9.2   ------------------------------------------------------------------------------------------------------------------ CrCl is unknown because both a height and weight (above a minimum accepted value) are required for this calculation. ------------------------------------------------------------------------------------------------------------------ No results found for this basename: HGBA1C,  in the last 72  hours ------------------------------------------------------------------------------------------------------------------ No results found for this basename: CHOL, HDL, LDLCALC, TRIG, CHOLHDL, LDLDIRECT,  in the last 72 hours ------------------------------------------------------------------------------------------------------------------ No results found for this basename: TSH, T4TOTAL, FREET3, T3FREE, THYROIDAB,  in the last 72 hours ------------------------------------------------------------------------------------------------------------------ No results found for this basename: VITAMINB12, FOLATE, FERRITIN, TIBC, IRON, RETICCTPCT,  in the last 72 hours  Coagulation profile No results found for this basename: INR, PROTIME,  in the last 168 hours  No results found for this basename: DDIMER,  in the last 72 hours  Cardiac Enzymes No results found for this basename: CK, CKMB, TROPONINI, MYOGLOBIN,  in the last 168 hours ------------------------------------------------------------------------------------------------------------------ No components found with this basename: POCBNP,   Micro Results No results found for this or any previous visit (from the past 240 hour(s)).     Assessment & Plan   Respiratory failure status post extubation on 3-4 L nasal cannula combination ofHCAP, Diastolic congestive heart failure and COPD UTI with ESBL on IV antibiotics until 8/8 Septic shock resolved Encephalopathy/dementia/bipolar disorder on multiple psychotropic medications: Haldol, Seroquel and Depakote Diastolic congestive heart failure continue with diuresis COPD continue with nebulizer treatments Dysphagia/BCM continue with G-tube feeding and by mouth dysphagia diet Healthcare associated pneumonia on IV antibiotics Hypernatremia Acute kidney injury-resolved Generalized weakness;  PT OT to evaluate Diabetes mellitus on insulin sliding scale Chronic pain followed by Dr. Welton FlakesKhan   plan    Continue same medications   Code Status: Full  DVT Prophylaxis  heparin   Carron CurieHijazi, Ahron Hulbert M.D on 06/19/2014 at 11:23 AM

## 2014-06-20 NOTE — Progress Notes (Signed)
[facility]                                                                                              Progress note     Patient Demographics  [name], is a 80 y.o. female  ZOX:[id number]  [id number]:[id number]  DOB - 06/23/1928  Admit date - 06/11/2014  Admitting Physician Carron Curie, MD  Outpatient Primary MD for the patient is [name], [name], DO  LOS - 9   No chief complaint on file.          Subjective:   [name] more alert and asked me today for a cup of coffee  Objective:   Vital signs  Temperature 98.6 Heart rate 69 Respiratory rate 19 Blood pressure 114/62 Pulse ox 95%    Exam Confused with flat affect Legally Blind with left pupil bigger than right Supple Neck,No JVD, No cervical lymphadenopathy appriciated.  Symmetrical Chest wall movement, poor inspiratory effort with bilateral basilar crackles RRR,No Gallops,Rubs or new Murmurs, No Parasternal Heave +ve B.Sounds, Abd Soft, Non tender, No organomegaly appriciated, No rebound - guarding or rigidity. Peg tube in place No Cyanosis, Clubbing positive for,    I&Os 1435/1650   Data Review   CBC  Recent Labs Lab 06/19/14 0520  WBC 9.1  HGB 10.0*  HCT 31.9*  PLT 313  MCV 104.2*  MCH 32.7  MCHC 31.3  RDW 14.9    Chemistries   Recent Labs Lab 06/14/14 0630 06/19/14 0520  NA 142 141  K 4.2 4.6  CL 97 99  CO2 34* 32  GLUCOSE 79 120*  BUN 25* 35*  CREATININE 0.52 0.64  CALCIUM 9.3 9.2   ------------------------------------------------------------------------------------------------------------------ CrCl is unknown because both a height and weight (above a minimum accepted value) are required for this calculation. ------------------------------------------------------------------------------------------------------------------ No results found for this basename: HGBA1C,  in the last 72  hours ------------------------------------------------------------------------------------------------------------------ No results found for this basename: CHOL, HDL, LDLCALC, TRIG, CHOLHDL, LDLDIRECT,  in the last 72 hours ------------------------------------------------------------------------------------------------------------------ No results found for this basename: TSH, T4TOTAL, FREET3, T3FREE, THYROIDAB,  in the last 72 hours ------------------------------------------------------------------------------------------------------------------ No results found for this basename: VITAMINB12, FOLATE, FERRITIN, TIBC, IRON, RETICCTPCT,  in the last 72 hours  Coagulation profile No results found for this basename: INR, PROTIME,  in the last 168 hours  No results found for this basename: DDIMER,  in the last 72 hours  Cardiac Enzymes No results found for this basename: CK, CKMB, TROPONINI, MYOGLOBIN,  in the last 168 hours ------------------------------------------------------------------------------------------------------------------ No components found with this basename: POCBNP,   Micro Results No results found for this or any previous visit (from the past 240 hour(s)).     Assessment & Plan   Respiratory failure status post extubation on 3-4 L nasal cannula combination ofHCAP, Diastolic congestive heart failure and COPD UTI with ESBL on IV antibiotics until 8/8 Septic shock resolved Encephalopathy/dementia/bipolar disorder on multiple psychotropic medications: Haldol, Seroquel and Depakote Diastolic congestive heart failure continue with diuresis COPD continue with nebulizer treatments Dysphagia/BCM continue with G-tube feeding and by mouth dysphagia diet Healthcare associated pneumonia on IV antibiotics Hypernatremia Acute  kidney injury-resolved Generalized weakness; PT OT to evaluate Diabetes mellitus on insulin sliding scale Chronic pain followed by Dr. Welton FlakesKhan   plan    Continue same medications   Code Status: Full  DVT Prophylaxis  heparin   Carron CurieHijazi, Vinayak Bobier M.D on 06/20/2014 at 11:33 AM

## 2014-06-22 LAB — URINALYSIS, ROUTINE W REFLEX MICROSCOPIC
BILIRUBIN URINE: NEGATIVE
Glucose, UA: NEGATIVE mg/dL
Hgb urine dipstick: NEGATIVE
KETONES UR: NEGATIVE mg/dL
Nitrite: NEGATIVE
Protein, ur: NEGATIVE mg/dL
Specific Gravity, Urine: 1.016 (ref 1.005–1.030)
Urobilinogen, UA: 0.2 mg/dL (ref 0.0–1.0)
pH: 7 (ref 5.0–8.0)

## 2014-06-22 LAB — URINE MICROSCOPIC-ADD ON

## 2014-06-23 ENCOUNTER — Other Ambulatory Visit (HOSPITAL_COMMUNITY): Payer: Self-pay

## 2014-06-23 LAB — BASIC METABOLIC PANEL
ANION GAP: 11 (ref 5–15)
BUN: 33 mg/dL — ABNORMAL HIGH (ref 6–23)
CALCIUM: 9.6 mg/dL (ref 8.4–10.5)
CO2: 33 meq/L — AB (ref 19–32)
CREATININE: 0.59 mg/dL (ref 0.50–1.10)
Chloride: 100 mEq/L (ref 96–112)
GFR, EST NON AFRICAN AMERICAN: 82 mL/min — AB (ref >90)
Glucose, Bld: 115 mg/dL — ABNORMAL HIGH (ref 70–99)
Potassium: 4.5 mEq/L (ref 3.7–5.3)
SODIUM: 144 meq/L (ref 137–147)

## 2014-06-23 LAB — CBC WITH DIFFERENTIAL/PLATELET
BASOS ABS: 0.1 10*3/uL (ref 0.0–0.1)
BASOS PCT: 1 % (ref 0–1)
EOS PCT: 6 % — AB (ref 0–5)
Eosinophils Absolute: 0.4 10*3/uL (ref 0.0–0.7)
HCT: 34.2 % — ABNORMAL LOW (ref 36.0–46.0)
Hemoglobin: 10.7 g/dL — ABNORMAL LOW (ref 12.0–15.0)
Lymphocytes Relative: 35 % (ref 12–46)
Lymphs Abs: 2.2 10*3/uL (ref 0.7–4.0)
MCH: 33 pg (ref 26.0–34.0)
MCHC: 31.3 g/dL (ref 30.0–36.0)
MCV: 105.6 fL — AB (ref 78.0–100.0)
Monocytes Absolute: 0.5 10*3/uL (ref 0.1–1.0)
Monocytes Relative: 8 % (ref 3–12)
Neutro Abs: 3.1 10*3/uL (ref 1.7–7.7)
Neutrophils Relative %: 50 % (ref 43–77)
Platelets: 330 10*3/uL (ref 150–400)
RBC: 3.24 MIL/uL — ABNORMAL LOW (ref 3.87–5.11)
RDW: 15 % (ref 11.5–15.5)
WBC: 6.3 10*3/uL (ref 4.0–10.5)

## 2014-06-23 LAB — PHOSPHORUS: Phosphorus: 2.9 mg/dL (ref 2.3–4.6)

## 2014-06-23 LAB — MAGNESIUM: MAGNESIUM: 2.3 mg/dL (ref 1.5–2.5)

## 2014-06-23 LAB — PRO B NATRIURETIC PEPTIDE: PRO B NATRI PEPTIDE: 352.6 pg/mL (ref 0–450)

## 2014-06-23 LAB — PREALBUMIN: Prealbumin: 30.3 mg/dL (ref 17.0–34.0)

## 2014-06-24 LAB — URINE CULTURE
COLONY COUNT: NO GROWTH
CULTURE: NO GROWTH

## 2014-06-25 ENCOUNTER — Other Ambulatory Visit (HOSPITAL_COMMUNITY): Payer: Self-pay

## 2014-06-26 LAB — BASIC METABOLIC PANEL
Anion gap: 12 (ref 5–15)
BUN: 28 mg/dL — AB (ref 6–23)
CALCIUM: 9.1 mg/dL (ref 8.4–10.5)
CO2: 30 mEq/L (ref 19–32)
CREATININE: 0.6 mg/dL (ref 0.50–1.10)
Chloride: 100 mEq/L (ref 96–112)
GFR calc Af Amer: >90 mL/min (ref >90)
GFR calc non Af Amer: 81 mL/min — ABNORMAL LOW (ref >90)
GLUCOSE: 73 mg/dL (ref 70–99)
Potassium: 4.7 mEq/L (ref 3.7–5.3)
Sodium: 142 mEq/L (ref 137–147)

## 2014-06-26 LAB — CBC
HEMATOCRIT: 32.1 % — AB (ref 36.0–46.0)
HEMOGLOBIN: 10 g/dL — AB (ref 12.0–15.0)
MCH: 32.7 pg (ref 26.0–34.0)
MCHC: 31.2 g/dL (ref 30.0–36.0)
MCV: 104.9 fL — ABNORMAL HIGH (ref 78.0–100.0)
Platelets: 302 10*3/uL (ref 150–400)
RBC: 3.06 MIL/uL — ABNORMAL LOW (ref 3.87–5.11)
RDW: 15.1 % (ref 11.5–15.5)
WBC: 5.5 10*3/uL (ref 4.0–10.5)

## 2014-06-29 ENCOUNTER — Other Ambulatory Visit (HOSPITAL_COMMUNITY): Payer: Medicare Other

## 2014-06-29 LAB — URINALYSIS, ROUTINE W REFLEX MICROSCOPIC
BILIRUBIN URINE: NEGATIVE
GLUCOSE, UA: NEGATIVE mg/dL
HGB URINE DIPSTICK: NEGATIVE
KETONES UR: NEGATIVE mg/dL
Leukocytes, UA: NEGATIVE
Nitrite: NEGATIVE
PROTEIN: NEGATIVE mg/dL
Specific Gravity, Urine: 1.016 (ref 1.005–1.030)
Urobilinogen, UA: 0.2 mg/dL (ref 0.0–1.0)
pH: 8.5 — ABNORMAL HIGH (ref 5.0–8.0)

## 2014-06-30 LAB — CBC WITH DIFFERENTIAL/PLATELET
Basophils Absolute: 0.1 10*3/uL (ref 0.0–0.1)
Basophils Relative: 1 % (ref 0–1)
EOS ABS: 0.4 10*3/uL (ref 0.0–0.7)
EOS PCT: 7 % — AB (ref 0–5)
HCT: 33.4 % — ABNORMAL LOW (ref 36.0–46.0)
Hemoglobin: 10.8 g/dL — ABNORMAL LOW (ref 12.0–15.0)
Lymphocytes Relative: 36 % (ref 12–46)
Lymphs Abs: 1.9 10*3/uL (ref 0.7–4.0)
MCH: 34.4 pg — ABNORMAL HIGH (ref 26.0–34.0)
MCHC: 32.3 g/dL (ref 30.0–36.0)
MCV: 106.4 fL — AB (ref 78.0–100.0)
Monocytes Absolute: 0.5 10*3/uL (ref 0.1–1.0)
Monocytes Relative: 10 % (ref 3–12)
NEUTROS PCT: 46 % (ref 43–77)
Neutro Abs: 2.3 10*3/uL (ref 1.7–7.7)
Platelets: 234 10*3/uL (ref 150–400)
RBC: 3.14 MIL/uL — ABNORMAL LOW (ref 3.87–5.11)
RDW: 15.2 % (ref 11.5–15.5)
WBC: 5.1 10*3/uL (ref 4.0–10.5)

## 2014-06-30 LAB — COMPREHENSIVE METABOLIC PANEL
ALT: 7 U/L (ref 0–35)
AST: 12 U/L (ref 0–37)
Albumin: 2.6 g/dL — ABNORMAL LOW (ref 3.5–5.2)
Alkaline Phosphatase: 92 U/L (ref 39–117)
Anion gap: 8 (ref 5–15)
BUN: 23 mg/dL (ref 6–23)
CALCIUM: 9.2 mg/dL (ref 8.4–10.5)
CO2: 32 mEq/L (ref 19–32)
Chloride: 99 mEq/L (ref 96–112)
Creatinine, Ser: 0.55 mg/dL (ref 0.50–1.10)
GFR calc Af Amer: >90 mL/min (ref >90)
GFR calc non Af Amer: 84 mL/min — ABNORMAL LOW (ref >90)
Glucose, Bld: 110 mg/dL — ABNORMAL HIGH (ref 70–99)
POTASSIUM: 4.2 meq/L (ref 3.7–5.3)
SODIUM: 139 meq/L (ref 137–147)
TOTAL PROTEIN: 6.8 g/dL (ref 6.0–8.3)
Total Bilirubin: <0.2 mg/dL — ABNORMAL LOW (ref 0.3–1.2)

## 2014-06-30 LAB — PREALBUMIN: Prealbumin: 25.2 mg/dL (ref 17.0–34.0)

## 2014-06-30 LAB — VALPROIC ACID LEVEL: VALPROIC ACID LVL: 33 ug/mL — AB (ref 50.0–100.0)

## 2014-07-03 ENCOUNTER — Other Ambulatory Visit: Payer: Self-pay | Admitting: *Deleted

## 2014-07-03 LAB — URINE CULTURE: Colony Count: >=100000

## 2014-07-03 MED ORDER — OXYCODONE HCL 5 MG PO TABS: ORAL_TABLET | ORAL | Status: DC

## 2014-07-03 NOTE — Telephone Encounter (Signed)
[facility] 

## 2014-07-08 ENCOUNTER — Non-Acute Institutional Stay (SKILLED_NURSING_FACILITY): Payer: Medicare Other | Admitting: Internal Medicine

## 2014-07-08 ENCOUNTER — Encounter: Payer: Self-pay | Admitting: Internal Medicine

## 2014-07-08 ENCOUNTER — Other Ambulatory Visit: Payer: Self-pay | Admitting: *Deleted

## 2014-07-08 DIAGNOSIS — E0789 Other specified disorders of thyroid: Secondary | ICD-10-CM

## 2014-07-08 DIAGNOSIS — R1310 Dysphagia, unspecified: Secondary | ICD-10-CM

## 2014-07-08 DIAGNOSIS — R6521 Severe sepsis with septic shock: Principal | ICD-10-CM

## 2014-07-08 DIAGNOSIS — G8929 Other chronic pain: Secondary | ICD-10-CM

## 2014-07-08 DIAGNOSIS — J9620 Acute and chronic respiratory failure, unspecified whether with hypoxia or hypercapnia: Secondary | ICD-10-CM

## 2014-07-08 DIAGNOSIS — R627 Adult failure to thrive: Secondary | ICD-10-CM

## 2014-07-08 DIAGNOSIS — T83511A Infection and inflammatory reaction due to indwelling urethral catheter, initial encounter: Secondary | ICD-10-CM

## 2014-07-08 DIAGNOSIS — Z931 Gastrostomy status: Secondary | ICD-10-CM

## 2014-07-08 DIAGNOSIS — I10 Essential (primary) hypertension: Secondary | ICD-10-CM

## 2014-07-08 DIAGNOSIS — R6520 Severe sepsis without septic shock: Secondary | ICD-10-CM

## 2014-07-08 DIAGNOSIS — N390 Urinary tract infection, site not specified: Secondary | ICD-10-CM

## 2014-07-08 DIAGNOSIS — E038 Other specified hypothyroidism: Secondary | ICD-10-CM

## 2014-07-08 DIAGNOSIS — E034 Atrophy of thyroid (acquired): Secondary | ICD-10-CM

## 2014-07-08 DIAGNOSIS — IMO0002 Reserved for concepts with insufficient information to code with codable children: Secondary | ICD-10-CM

## 2014-07-08 DIAGNOSIS — F3175 Bipolar disorder, in partial remission, most recent episode depressed: Secondary | ICD-10-CM

## 2014-07-08 DIAGNOSIS — G9340 Encephalopathy, unspecified: Secondary | ICD-10-CM

## 2014-07-08 DIAGNOSIS — F03918 Unspecified dementia, unspecified severity, with other behavioral disturbance: Secondary | ICD-10-CM

## 2014-07-08 DIAGNOSIS — A419 Sepsis, unspecified organism: Secondary | ICD-10-CM

## 2014-07-08 DIAGNOSIS — F0391 Unspecified dementia with behavioral disturbance: Secondary | ICD-10-CM

## 2014-07-08 MED ORDER — OXYCODONE HCL 5 MG PO TABS: ORAL_TABLET | ORAL | Status: DC

## 2014-07-08 NOTE — Progress Notes (Signed)
MRN: [id number] Name: [name]  Sex: female Age: 79 y.o. DOB: 1928/07/30  PSC #: Ronni Rumble Facility/Room: 219A Level Of Care: SNF Provider: Merrilee Seashore D Emergency Contacts: Extended Emergency Contact Information Primary Emergency Contact: Mccardle,[name] Address: [address]           [place], [place] [address] [place] Home Phone: [phone] Relation: Son Secondary Emergency Contact: Mondragon,Hope Address: [address]          [place], [place] [address] [place] Home Phone: [phone] Relation: Daughter   Allergies: Review of patient's allergies indicates no known allergies.  Chief Complaint  Patient presents with  . nursing home admission    HPI: Patient is 80 y.o. female with bipolar dx, dementia  who had prolonged hospitalization 2/2 to sepsis, resp failure 2/2 HCAP and pulmonary edema with intubation, and 2 UTI's, one on hosp discharge,  who is admitted to SNF with PEG feeding,FTT and profound weakness.  Past Medical History  Diagnosis Date  . COPD (chronic obstructive pulmonary disease)   . Hypertension   . Blind   . Hypothyroid   . Pacemaker   . Renal insufficiency   . Finger fracture   . Dementia   . Bipolar disorder   . Dysphagia     needs thick nectar liquids  . Atrial fibrillation   . Pneumonia   . Anxiety   . Diastolic heart failure     Past Surgical History  Procedure Laterality Date  . Insert / replace / remove pacemaker    . Tracheostomy  02/27/13    decannulated 03/29/13  . Gastrostomy tube placement  03/11/13      Medication List       This list is accurate as of: 07/08/14 11:59 PM.  Always use your most recent med list.               CENTAMIN Liqd  Take 5 mLs by mouth daily.     docusate 50 MG/5ML liquid  Commonly known as:  COLACE  Take 100 mg by mouth 2 (two) times daily.     donepezil 10 MG tablet  Commonly known as:  ARICEPT  Take 10 mg by mouth at bedtime.     DULoxetine 60 MG  capsule  Commonly known as:  CYMBALTA  Take 60 mg by mouth daily.     famotidine 20 MG tablet  Commonly known as:  PEPCID  Take 1 tablet (20 mg total) by mouth 2 (two) times daily.     feeding supplement (OSMOLITE 1.5 CAL) Liqd  Place 60 mLs into feeding tube continuous.     furosemide 20 MG tablet  Commonly known as:  LASIX  Take 20 mg by mouth daily.     insulin aspart 100 UNIT/ML injection  Commonly known as:  novoLOG  Inject 5 Units into the skin every 6 (six) hours. CBG >150     ipratropium-albuterol 0.5-2.5 (3) MG/3ML Soln  Commonly known as:  DUONEB  Take 3 mLs by nebulization every 4 (four) hours as needed.     lactobacillus Pack  Take 1 g by mouth 2 (two) times daily with a meal.     levothyroxine 112 MCG tablet  Commonly known as:  SYNTHROID, LEVOTHROID  Take 1 tablet (112 mcg total) by mouth daily.     lidocaine 5 %  Commonly known as:  LIDODERM  Place 1 patch onto the skin daily. Remove & Discard patch within 12 hours or as directed  by MD     NAMENDA XR 28 MG Cp24  Generic drug:  Memantine HCl ER  Take 1 capsule by mouth at bedtime.     oxyCODONE 5 MG immediate release tablet  Commonly known as:  ROXICODONE  Take one tablet by mouth every 4 hours as needed for pain; Take one tablet by mouth every 8 hours for pain     polyethylene glycol packet  Commonly known as:  MIRALAX / GLYCOLAX  Take 17 g by mouth daily.     QUEtiapine 50 MG tablet  Commonly known as:  SEROQUEL  Take 50 mg by mouth at bedtime.     Valproic Acid 500 MG Cpdr  Take 500 mg by mouth 2 (two) times daily.        Meds ordered this encounter  Medications  . Memantine HCl ER (NAMENDA XR) 28 MG CP24    Sig: Take 1 capsule by mouth at bedtime.  Marland Kitchen DISCONTD: docusate sodium (COLACE) 100 MG capsule    Sig: Take 100 mg by mouth 2 (two) times daily.  Marland Kitchen DISCONTD: saccharomyces boulardii (FLORASTOR) 250 MG capsule    Sig: Take 250 mg by mouth 2 (two) times daily.  . insulin aspart  (NOVOLOG) 100 UNIT/ML injection    Sig: Inject 5 Units into the skin every 6 (six) hours. CBG >150  . lidocaine (LIDODERM) 5 %    Sig: Place 1 patch onto the skin daily. Remove & Discard patch within 12 hours or as directed by MD  . Valproic Acid 500 MG CPDR    Sig: Take 500 mg by mouth 2 (two) times daily.  . Nutritional Supplements (FEEDING SUPPLEMENT, OSMOLITE 1.5 CAL,) LIQD    Sig: Place 60 mLs into feeding tube continuous.  . polyethylene glycol (MIRALAX / GLYCOLAX) packet    Sig: Take 17 g by mouth daily.  . Multiple Vitamins-Minerals (CENTAMIN) LIQD    Sig: Take 5 mLs by mouth daily.  . furosemide (LASIX) 20 MG tablet    Sig: Take 20 mg by mouth daily.  Marland Kitchen lactobacillus (FLORANEX/LACTINEX) PACK    Sig: Take 1 g by mouth 2 (two) times daily with a meal.  . docusate (COLACE) 50 MG/5ML liquid    Sig: Take 100 mg by mouth 2 (two) times daily.    Immunization History  Administered Date(s) Administered  . Influenza-Unspecified 09/24/2012    History  Substance Use Topics  . Smoking status: Former Smoker -- .5 years    Types: Cigarettes  . Smokeless tobacco: Not on file  . Alcohol Use: Not on file    Family history is noncontributory    Review of Systems  Pt really unable to give meaningful;nurses without concerns    Filed Vitals:   07/08/14 1659  BP: 127/70  Pulse: 75  Temp: 98.6 F (37 C)  Resp: 18    Physical Exam  GENERAL APPEARANCE: Alert; no acute distress;profoundly weak appearing  SKIN: No diaphoresis rash HEAD: Normocephalic, atraumatic  EYES: Conjunctiva/lids clear. Pupils round, reactive. EOMs intact.  EARS: External exam WNL, canals clear. Hearing grossly normal.  NOSE: No deformity or discharge.  MOUTH/THROAT: Lips w/o lesions  RESPIRATORY: Breathing is even, unlabored. Lung sounds are clear   CARDIOVASCULAR: Heart RRR no murmurs, rubs or gallops. No peripheral edema.  GASTROINTESTINAL: Abdomen is soft, non-tender, not distended w/ normal bowel  sounds; PEG inplace GENITOURINARY: Bladder non tender, not distended  MUSCULOSKELETAL: No abnormal joints or musculature NEUROLOGIC: O Cranial nerves 2-12 grossly intact PSYCHIATRIC: pt  appears confused and lost  Patient Active Problem List   Diagnosis Date Noted  . FTT (failure to thrive) in adult 07/13/2014  . UTI (urinary tract infection) 07/13/2014  . Palliative care encounter 06/11/2014  . Acute encephalopathy 06/08/2014  . Septic shock 06/07/2014  . Chronic pharyngitis 05/11/2013  . Acute respiratory failure 03/04/2013  . Acute and chronic respiratory failure 03/04/2013  . Bipolar affective disorder 02/15/2013  . Tardive dyskinesia 02/15/2013  . Essential hypertension, benign 02/15/2013  . COPD (chronic obstructive pulmonary disease) 02/15/2013  . Unspecified constipation 02/15/2013  . Weight loss 02/15/2013  . Dysphagia 02/15/2013  . Chronic pain 02/15/2013  . Dementia with behavioral disturbance 08/16/2012  . Pacemaker 08/16/2012  . Hypothyroidism 08/16/2012    CBC    Component Value Date/Time   WBC 5.1 06/30/2014 0614   RBC 3.14* 06/30/2014 0614   HGB 10.8* 06/30/2014 0614   HCT 33.4* 06/30/2014 0614   PLT 234 06/30/2014 0614   MCV 106.4* 06/30/2014 0614   LYMPHSABS 1.9 06/30/2014 0614   MONOABS 0.5 06/30/2014 0614   EOSABS 0.4 06/30/2014 0614   BASOSABS 0.1 06/30/2014 0614    CMP     Component Value Date/Time   NA 139 06/30/2014 0614   K 4.2 06/30/2014 0614   CL 99 06/30/2014 0614   CO2 32 06/30/2014 0614   GLUCOSE 110* 06/30/2014 0614   BUN 23 06/30/2014 0614   CREATININE 0.55 06/30/2014 0614   CALCIUM 9.2 06/30/2014 0614   PROT 6.8 06/30/2014 0614   ALBUMIN 2.6* 06/30/2014 0614   AST 12 06/30/2014 0614   ALT 7 06/30/2014 0614   ALKPHOS 92 06/30/2014 0614   BILITOT <0.2* 06/30/2014 0614   GFRNONAA 84* 06/30/2014 0614   GFRAA >90 06/30/2014 0614    Assessment and Plan  Septic shock Felt 2/2 to HCAP or possibly ESBL treated with meropenum  Acute and chronic  respiratory failure 2/2 HCAP and pulmonary edema, requiring intubation and pressors - extubated 7/28 and [facility] to SNF on 2 L [place]  FTT (failure to thrive) in adult Pt has tube feedings at night and dyphagia 1 with honey thick; continues to moderate to severe generalized weakness  Dementia with behavioral disturbance SAricept and namenda along with bipolar meds  Bipolar affective disorder Seroquel, valproic acid and cymbalta  Chronic pain Seen by pain management at [facility] and regimen worked out  Acute encephalopathy Plagued hospitalization at [facility] and [facility] with severe encephalopathy exacerbated by dementia and bipolar which gradually resolved  UTI (urinary tract infection) Early in hosp at [facility] pt had ESBL treated with meropenum;2 days before [facility] from [facility] she had onset of fever , urine was sent and pt started empirically on levaquin. HOWEVER urine grew out Providentia Stuartii resistant to levoquin so Levaquin was d/c and Rocephin 1 gm IM for 7 days started  Dysphagia Pt with PEG tube feedings and honey thick  Essential hypertension, benign On Lasix only  Hypothyroidism TSH 1.47 on 112 mcg daily    Margit Hanks, MD

## 2014-07-08 NOTE — Telephone Encounter (Signed)
[facility] 

## 2014-07-09 ENCOUNTER — Encounter: Payer: Self-pay | Admitting: Internal Medicine

## 2014-07-13 ENCOUNTER — Encounter: Payer: Self-pay | Admitting: Internal Medicine

## 2014-07-13 DIAGNOSIS — R627 Adult failure to thrive: Secondary | ICD-10-CM | POA: Insufficient documentation

## 2014-07-13 DIAGNOSIS — Z931 Gastrostomy status: Secondary | ICD-10-CM | POA: Insufficient documentation

## 2014-07-13 DIAGNOSIS — N390 Urinary tract infection, site not specified: Secondary | ICD-10-CM | POA: Insufficient documentation

## 2014-07-13 NOTE — Assessment & Plan Note (Signed)
Felt 2/2 to HCAP or possibly ESBL treated with meropenum

## 2014-07-13 NOTE — Assessment & Plan Note (Signed)
Pt has tube feedings at night and dyphagia 1 with honey thick; continues to moderate to severe generalized weakness

## 2014-07-13 NOTE — Assessment & Plan Note (Signed)
TSH 1.47 on 112 mcg daily

## 2014-07-13 NOTE — Assessment & Plan Note (Signed)
SAricept and namenda along with bipolar meds

## 2014-07-13 NOTE — Assessment & Plan Note (Signed)
Seroquel, valproic acid and cymbalta

## 2014-07-13 NOTE — Assessment & Plan Note (Signed)
Early in hosp at [facility] pt had ESBL treated with meropenum;2 days before [facility] she had onset of fever , urine was sent and pt started empirically on levaquin. HOWEVER urine grew out Providentia Stuartii resistant to levoquin so Levaquin was d/c and Rocephin 1 gm IM for 7 days started

## 2014-07-13 NOTE — Assessment & Plan Note (Signed)
Plagued hospitalization at [facility] and [facility] with severe encephalopathy exacerbated by dementia and bipolar which gradually resolved

## 2014-07-13 NOTE — Assessment & Plan Note (Signed)
Seen by pain management at [facility] and regimen worked out

## 2014-07-13 NOTE — Assessment & Plan Note (Signed)
On Lasix only

## 2014-07-13 NOTE — Assessment & Plan Note (Signed)
2/2 HCAP and pulmonary edema, requiring intubation and pressors - extubated 7/28 and transferred to [facility]c to SNF on 2 L [place]

## 2014-07-13 NOTE — Assessment & Plan Note (Signed)
Pt with PEG tube feedings and honey thick

## 2014-07-15 ENCOUNTER — Other Ambulatory Visit: Payer: Self-pay | Admitting: *Deleted

## 2014-07-15 MED ORDER — OXYCODONE HCL 5 MG PO TABS: ORAL_TABLET | ORAL | Status: DC

## 2014-07-15 NOTE — Telephone Encounter (Signed)
[facility] 

## 2014-07-22 ENCOUNTER — Other Ambulatory Visit: Payer: Self-pay | Admitting: *Deleted

## 2014-07-22 MED ORDER — OXYCODONE HCL 5 MG PO TABS: ORAL_TABLET | ORAL | Status: DC

## 2014-07-22 NOTE — Telephone Encounter (Signed)
[facility] 

## 2014-07-30 ENCOUNTER — Encounter: Payer: Self-pay | Admitting: Internal Medicine

## 2014-07-30 ENCOUNTER — Non-Acute Institutional Stay (SKILLED_NURSING_FACILITY): Payer: Medicare Other | Admitting: Internal Medicine

## 2014-07-30 DIAGNOSIS — F0391 Unspecified dementia with behavioral disturbance: Secondary | ICD-10-CM

## 2014-07-30 DIAGNOSIS — I10 Essential (primary) hypertension: Secondary | ICD-10-CM

## 2014-07-30 DIAGNOSIS — F3175 Bipolar disorder, in partial remission, most recent episode depressed: Secondary | ICD-10-CM

## 2014-07-30 DIAGNOSIS — IMO0002 Reserved for concepts with insufficient information to code with codable children: Secondary | ICD-10-CM

## 2014-07-30 DIAGNOSIS — J439 Emphysema, unspecified: Secondary | ICD-10-CM

## 2014-07-30 DIAGNOSIS — J438 Other emphysema: Secondary | ICD-10-CM

## 2014-07-30 DIAGNOSIS — G243 Spasmodic torticollis: Secondary | ICD-10-CM

## 2014-07-30 DIAGNOSIS — F03918 Unspecified dementia, unspecified severity, with other behavioral disturbance: Secondary | ICD-10-CM

## 2014-07-30 DIAGNOSIS — G8929 Other chronic pain: Secondary | ICD-10-CM

## 2014-07-30 NOTE — Progress Notes (Signed)
Patient ID: [name], female   DOB: January 28, 1928,Melanie WilshireMRN: [id number]  Location:  [facility] Provider:  Gwenith Spitz. Renato Gails, D.O., C.M.D.  Code Status:  Full code  Chief Complaint  Patient presents with  . Acute Visit    phone meeting with her son, [name]  . Medical Management of Chronic Issues    HPI:  80 yo white female long term care resident seen for acute visit due to concerns from her son, [name].  We spoke over the phone.  He notes that at [facility] the lidoderm patches were being applied to her shoulders and knees when her oxycodone was reduced and this seemed to decrease her extreme pain sensitivity.  When she was sent here, the instructions were vague saying affected area and it's been applied to her back.  He also requests that she have "blocks" for her hands to help keep them open.  Apparently, she does have some braces here for this purpose.  She also had some sort of head/neck support there that was helpful.  He wants to spend as much time with her here and likes to take her out.  He would like to be able to taker her out in his vehicle, but her high-backed wheelchair does not fold small enough and he asks if therapy could not find one that would.  He agrees with me that she is much more awake with less oxycodone.  He would like to see her up oob more often.  Upon discussion with staff, her extreme sensitivity to touch makes it very hard for them to get her up b/c she yells with even light touch.  Review of Systems:  Review of Systems  Constitutional: Negative for fever.  HENT: Negative for congestion.   Respiratory: Negative for cough and shortness of breath.   Cardiovascular: Negative for chest pain.  Gastrointestinal: Negative for abdominal pain, constipation, blood in stool and melena.  Genitourinary: Negative for dysuria.  Musculoskeletal: Positive for back pain, joint pain, myalgias and neck pain. Negative for falls.  Skin: Negative for rash.    Neurological: Negative for dizziness.  Endo/Heme/Allergies: Bruises/bleeds easily.  Psychiatric/Behavioral: Positive for depression and memory loss.    Medications: Patient's Medications  New Prescriptions   No medications on file  Previous Medications   DOCUSATE (COLACE) 50 MG/5ML LIQUID    Take 100 mg by mouth 2 (two) times daily.   DONEPEZIL (ARICEPT) 10 MG TABLET    Take 10 mg by mouth at bedtime.   DULOXETINE (CYMBALTA) 60 MG CAPSULE    Take 60 mg by mouth daily.   FAMOTIDINE (PEPCID) 20 MG TABLET    Take 1 tablet (20 mg total) by mouth 2 (two) times daily.   FUROSEMIDE (LASIX) 20 MG TABLET    Take 20 mg by mouth daily.   INSULIN ASPART (NOVOLOG) 100 UNIT/ML INJECTION    Inject 5 Units into the skin every 6 (six) hours. CBG >150   IPRATROPIUM-ALBUTEROL (DUONEB) 0.5-2.5 (3) MG/3ML SOLN    Take 3 mLs by nebulization every 4 (four) hours as needed.   LACTOBACILLUS (FLORANEX/LACTINEX) PACK    Take 1 g by mouth 2 (two) times daily with a meal.   LEVOTHYROXINE (SYNTHROID, LEVOTHROID) 112 MCG TABLET    Take 1 tablet (112 mcg total) by mouth daily.   LIDOCAINE (LIDODERM) 5 %    Place 1 patch onto the skin daily. Remove & Discard patch within 12 hours or as directed by MD  MEMANTINE HCL ER (NAMENDA XR) 28 MG CP24    Take 1 capsule by mouth at bedtime.   MULTIPLE VITAMINS-MINERALS (CENTAMIN) LIQD    Take 5 mLs by mouth daily.   NUTRITIONAL SUPPLEMENTS (FEEDING SUPPLEMENT, OSMOLITE 1.5 CAL,) LIQD    Place 60 mLs into feeding tube continuous.   OXYCODONE (ROXICODONE) 5 MG IMMEDIATE RELEASE TABLET    Take one tablet by mouth every 8 hours for pain   POLYETHYLENE GLYCOL (MIRALAX / GLYCOLAX) PACKET    Take 17 g by mouth daily.   QUETIAPINE (SEROQUEL) 50 MG TABLET    Take 50 mg by mouth at bedtime.   VALPROIC ACID 500 MG CPDR    Take 500 mg by mouth 2 (two) times daily.  Modified Medications   No medications on file  Discontinued Medications   No medications on file    Physical Exam: Filed  Vitals:   07/30/14 1248  BP: 122/67  Pulse: 75  Temp: 98 F (36.7 C)  Resp: 18  Height:  (1.753 m)  Weight: 146 lb (66.225 kg)  SpO2: 97%  Physical Exam  Constitutional: No distress.  Chronically ill appearing white female in bed  Cardiovascular: Normal rate, regular rhythm, normal heart sounds and intact distal pulses.   Pulmonary/Chest: Effort normal and breath sounds normal. No respiratory distress. She has no wheezes.  Abdominal: Soft. Bowel sounds are normal. She exhibits no distension and no mass. There is no tenderness.  Musculoskeletal: She exhibits tenderness.  Of skin of arms, legs, pain with slightest movement  Neurological: She is alert.  Skin: Skin is warm and dry.  Psychiatric:  Flat affect     Labs reviewed: Basic Metabolic Panel:  Recent Labs  13/24/40 0400 06/11/14 0450  06/23/14 0500 06/26/14 0645 06/30/14 0614  NA 147 146  < > 144 142 139  K 3.6* 4.2  < > 4.5 4.7 4.2  CL 103 101  < > 100 100 99  CO2 31 31  < > 33* 30 32  GLUCOSE 113* 107*  < > 115* 73 110*  BUN 29* 26*  < > 33* 28* 23  CREATININE 0.58 0.44*  < > 0.59 0.60 0.55  CALCIUM 9.2 9.6  < > 9.6 9.1 9.2  MG 1.9 1.8  --  2.3  --   --   PHOS 3.4 2.3  --  2.9  --   --   < > = values in this interval not displayed.  Liver Function Tests:  Recent Labs  11/28/13 1530 06/12/14 0500 06/30/14 0614  AST ALT ALKPHOS 98 71 92  BILITOT <0.2* <0.2* <0.2*  PROT 6.1 6.6 6.8  ALBUMIN 2.5* 2.4* 2.6*    CBC:  Recent Labs  06/12/14 0500  06/23/14 0500 06/26/14 0645 06/30/14 0614  WBC 10.2  < > 6.3 5.5 5.1  NEUTROABS 6.5  --  3.1  --  2.3  HGB 10.4*  < > 10.7* 10.0* 10.8*  HCT 32.1*  < > 34.2* 32.1* 33.4*  MCV 103.2*  < > 105.6* 104.9* 106.4*  PLT 293  < > 330 302 234  < > = values in this interval not displayed.   Assessment/Plan 1. Chronic pain -cont low dose oxycodone -increase lidoderm to 2 patch, 1/2 of one applied to each shoulder and 1/2 of one  applied to each knee to help with pain so she can be moved more and possibly go out with her son more  often  2. Bipolar disorder, in partial remission, most recent episode depressed -stable, followed by [facility] here  3. Dementia with behavioral disturbance - cont current therapy  4. Essential hypertension, benign -bp at goal  5. Pulmonary emphysema, unspecified emphysema type -stable, on O2, avoid increased doses of narcotics due to respiratory depression  6. Torticollis, spasmodic -has difficulty with neck support--will see what can be one from the therapy perspective, but we also have to be careful about creating pressure areas on her with her fragile skin and minimal mobility  Family/ staff Communication: had 15 minute phone conversation with her son, Child psychotherapist and unit supervisor were also present and participated in the family meeting  Goals of care: full code, long term care resident  Labs/tests ordered:  No new today;  Has upcoming tsh due to synthroid dose adjustment in about 6 wks

## 2014-08-16 ENCOUNTER — Encounter: Payer: Self-pay | Admitting: Internal Medicine

## 2014-09-09 ENCOUNTER — Other Ambulatory Visit: Payer: Self-pay | Admitting: *Deleted

## 2014-09-09 MED ORDER — OXYCODONE HCL 5 MG PO TABS: ORAL_TABLET | ORAL | Status: DC

## 2014-09-09 NOTE — Telephone Encounter (Signed)
[facility] 

## 2014-09-17 ENCOUNTER — Non-Acute Institutional Stay (SKILLED_NURSING_FACILITY): Payer: Medicare Other | Admitting: Internal Medicine

## 2014-09-17 ENCOUNTER — Encounter: Payer: Self-pay | Admitting: Internal Medicine

## 2014-09-17 DIAGNOSIS — E034 Atrophy of thyroid (acquired): Secondary | ICD-10-CM

## 2014-09-17 DIAGNOSIS — R1310 Dysphagia, unspecified: Secondary | ICD-10-CM

## 2014-09-17 DIAGNOSIS — F0391 Unspecified dementia with behavioral disturbance: Secondary | ICD-10-CM

## 2014-09-17 DIAGNOSIS — R627 Adult failure to thrive: Secondary | ICD-10-CM

## 2014-09-17 DIAGNOSIS — G8929 Other chronic pain: Secondary | ICD-10-CM

## 2014-09-17 DIAGNOSIS — F03918 Unspecified dementia, unspecified severity, with other behavioral disturbance: Secondary | ICD-10-CM

## 2014-09-17 DIAGNOSIS — F3175 Bipolar disorder, in partial remission, most recent episode depressed: Secondary | ICD-10-CM

## 2014-09-17 DIAGNOSIS — E038 Other specified hypothyroidism: Secondary | ICD-10-CM

## 2014-09-17 DIAGNOSIS — J439 Emphysema, unspecified: Secondary | ICD-10-CM

## 2014-09-17 DIAGNOSIS — Z7189 Other specified counseling: Secondary | ICD-10-CM

## 2014-09-17 NOTE — Progress Notes (Signed)
Patient ID: [name], female   DOB: 01/18/1928, 80 y.o.   MRN: [id number]  [facility] Provider:  Gwenith Spitziffany L. Renato Gailseed, D.O., C.M.D.  Code Status:  Full code  Chief Complaint  Patient presents with  . Medical Management of Chronic Issues    Also meeting with her son    HPI:  80 yo white female long term care resident seen for medical management of chronic diseases.  Her son, [name], also requested to meet with me today.  A care plan meeting was held.  Her goals of care were readdressed.  [name] would like for her to be up oob daily if possible, but Melanie DandyClare frequently refuses this with therapy and nursing staff.  [name] says he will be here in the early mornings to bring her coffee and to encourage her to get up out of bed.  He wants to see if this does not help her get more active and improve her quality of life.  During the visit today, she did not complain of pain, but this seems to primarily happen when she is moved or changed.  She continues with tube feedings and pureed concho diet with nectar thickened liquids plus sandwiches and pancakes.  She loves coffee.    We reviewed the MOST form today.  [name] continues to want everything done for her, but when I did tell him about what CPR entails, he did say, he wouldn't want her ribs to be broken.  He says that he wants to see how she does while he is coming more regularly and getting up oob.  If she does not improve, he agrees to readdress her goals to consider a more palliative approach to her care.  We discussed quality of life quite a bit.   Review of Systems:  Review of Systems  Constitutional: Positive for malaise/fatigue. Negative for fever.  HENT: Negative for congestion.   Eyes: Negative for blurred vision.  Respiratory: Negative for cough and shortness of breath.   Cardiovascular: Negative for chest pain and leg swelling.  Gastrointestinal: Negative for abdominal pain, constipation, blood in stool and melena.    Musculoskeletal: Positive for myalgias, back pain and joint pain. Negative for falls.  Skin: Negative for rash.  Neurological: Positive for weakness. Negative for dizziness.  Endo/Heme/Allergies: Bruises/bleeds easily.  Psychiatric/Behavioral: Positive for depression and memory loss. The patient is not nervous/anxious and does not have insomnia.     Medications: Patient's Medications  New Prescriptions   No medications on file  Previous Medications   DOCUSATE (COLACE) 50 MG/5ML LIQUID    Take 100 mg by mouth 2 (two) times daily.   DONEPEZIL (ARICEPT) 10 MG TABLET    Take 10 mg by mouth at bedtime.   DULOXETINE (CYMBALTA) 60 MG CAPSULE    Take 60 mg by mouth daily.   FAMOTIDINE (PEPCID) 20 MG TABLET    Take 1 tablet (20 mg total) by mouth 2 (two) times daily.   FUROSEMIDE (LASIX) 20 MG TABLET    Take 20 mg by mouth daily.   INSULIN ASPART (NOVOLOG) 100 UNIT/ML INJECTION    Inject 5 Units into the skin every 6 (six) hours. CBG >150   IPRATROPIUM-ALBUTEROL (DUONEB) 0.5-2.5 (3) MG/3ML SOLN    Take 3 mLs by nebulization every 4 (four) hours as needed.   LEVOTHYROXINE (SYNTHROID, LEVOTHROID) 125 MCG TABLET    Take 125 mcg by mouth daily before breakfast.   LIDOCAINE (LIDODERM) 5 %    Place 1 patch  onto the skin daily. Apply 1/2 patch to each shoulder and to each knee for 12 hours, then remove   MELOXICAM (MOBIC) 15 MG TABLET    Take 15 mg by mouth daily.   MEMANTINE HCL ER (NAMENDA XR) 28 MG CP24    Take 1 capsule by mouth at bedtime.   MULTIPLE VITAMINS-MINERALS (CENTAMIN) LIQD    Take 5 mLs by mouth daily.   NUTRITIONAL SUPPLEMENTS (FEEDING SUPPLEMENT, OSMOLITE 1.5 CAL,) LIQD    Place 60 mLs into feeding tube continuous.   OXYCODONE (OXY-IR) 5 MG CAPSULE    Take 5 mg by mouth every 4 (four) hours as needed.   OXYCODONE (ROXICODONE) 5 MG IMMEDIATE RELEASE TABLET    Take one tablet by mouth every 8 hours; Take one tablet by mouth every 4 hours as needed for pain   POLYETHYLENE GLYCOL (MIRALAX  / GLYCOLAX) PACKET    Take 17 g by mouth daily.   PROPYLENE GLYCOL (SYSTANE BALANCE) 0.6 % SOLN    Apply 1 drop to eye 2 (two) times daily.   QUETIAPINE (SEROQUEL) 50 MG TABLET    Take 50 mg by mouth at bedtime.   VALPROIC ACID 500 MG CPDR    Take 500 mg by mouth 2 (two) times daily.  Modified Medications   No medications on file  Discontinued Medications   LACTOBACILLUS (FLORANEX/LACTINEX) PACK    Take 1 g by mouth 2 (two) times daily with a meal.   LEVOTHYROXINE (SYNTHROID, LEVOTHROID) 112 MCG TABLET    Take 1 tablet (112 mcg total) by mouth daily.    Physical Exam: Filed Vitals:   09/17/14 1054  BP: 111/60  Pulse: 72  Temp: 98.2 F (36.8 C)  Resp: 20  Height: 5\' 9"  (1.753 m)  SpO2: 94%  Physical Exam  Constitutional: No distress.  Chronically ill appearing frail white female resting with HOB up  Cardiovascular: Normal rate, regular rhythm and intact distal pulses.   Occasional pvcs  Pulmonary/Chest: Breath sounds normal.  Has prolonged expiratory phase;  Using O2 via [place]  Abdominal: Soft. Bowel sounds are normal. She exhibits no distension and no mass. There is no tenderness.  PEG in place w/o erythema, warmth, drainage  Musculoskeletal: She exhibits tenderness.  Bilateral shoulders and knees especially with any ROM;  Using right hand foam device for contracture prevention;  Has lidoderm patches on shoulders and knees  Neurological: She is alert.  Oriented to person and place, knows her son also  Skin: Skin is warm and dry. There is pallor.  Psychiatric:  Facial expression brightens when her son is in room    Labs reviewed: Basic Metabolic Panel:  Recent Labs  16/10/96 0400 06/11/14 0450  06/23/14 0500 06/26/14 0645 06/30/14 0614  NA 147 146  < > 144 142 139  K 3.6* 4.2  < > 4.5 4.7 4.2  CL 103 101  < > 100 100 99  CO2 31 31  < > 33* 30 32  GLUCOSE 113* 107*  < > 115* 73 110*  BUN 29* 26*  < > 33* 28* 23  CREATININE 0.58 0.44*  < > 0.59 0.60 0.55  CALCIUM 9.2  9.6  < > 9.6 9.1 9.2  MG 1.9 1.8  --  2.3  --   --   PHOS 3.4 2.3  --  2.9  --   --   < > = values in this interval not displayed.  Liver Function Tests:  Recent Labs  11/28/13 1530 06/12/14 0500  06/30/14 0614  AST 13 22 12   ALT 10 14 7   ALKPHOS 98 71 92  BILITOT <0.2* <0.2* <0.2*  PROT 6.1 6.6 6.8  ALBUMIN 2.5* 2.4* 2.6*    CBC:  Recent Labs  06/12/14 0500  06/23/14 0500 06/26/14 0645 06/30/14 0614  WBC 10.2  < > 6.3 5.5 5.1  NEUTROABS 6.5  --  3.1  --  2.3  HGB 10.4*  < > 10.7* 10.0* 10.8*  HCT 32.1*  < > 34.2* 32.1* 33.4*  MCV 103.2*  < > 105.6* 104.9* 106.4*  PLT 293  < > 330 302 234  < > = values in this interval not displayed.  Assessment/Plan 1. Chronic pain -cont lidoderm patches which have worked Adult nursewonderfully to reduce her need for narcotics (does not tolerate these well due to her respiratory failure)--alertness is a bit better and she is moving a little more per [name] -cont unusual regimen of oxycodone 5mg  po q8h routinely with 5mg  q 4 hrs for breakthrough pain -limits her ability to be in agreement with getting up in her wheelchair--her son will plan to help encourage this when he is here  2. Bipolar disorder, in partial remission, most recent episode depressed -cont seroquel, depakote and cymbalta for her mood and her pain -f/u depakote level with labs in 1 month  3. Dementia with behavioral disturbance -will try to change her to namzaric from namenda and aricept to decrease pill burden and also b/c of her dysphagia--can sprinkle single capsule on her food.  4. Pulmonary emphysema, unspecified emphysema type -has been stable lately with duonebs only -must be diligent to avoid high doses of narcotics due to h/o respiratory depression -cont chronic oxygen therapy via [place] -previously had tracheostomy  5. FTT (failure to thrive) in adult -has improved slightly recently -will see how things go when her son comes more often -if she continues to decline,  we will readdress her goals of care to a more palliative approach if he is ready for that.  6. Dysphagia -cont concho pureed diet with nectar thickened liquids and finger foods she likes along with her tube feedings  7. Hypothyroidism due to acquired atrophy of thyroid -last TSH was wnl on 10/23 at 2.25 -cont synthroid 125mcg each morning -recheck in 1 month b/c we've had difficulty getting this stabilized  8. Counseling regarding advanced directives and goals of care See HPI and below-->30 minutes were spent with Melanie Dandylare and her son, [name], discussing her current care  Family/ staff Communication: care plan meeting was held with her son, [name], social services, ST, OT, and nursing unit supervisor--questions were answered and goals of care addressed again  Goals of care: remains full treatment measures, but her son will reconsider if she does not improve while he is coming more regularly to help get her up and be here  Labs/tests ordered:  Cbc, cmp, prealbumin, TSH in 1 month

## 2014-10-22 ENCOUNTER — Non-Acute Institutional Stay (SKILLED_NURSING_FACILITY): Payer: Medicare Other | Admitting: Internal Medicine

## 2014-10-22 ENCOUNTER — Encounter: Payer: Self-pay | Admitting: Internal Medicine

## 2014-10-22 DIAGNOSIS — E034 Atrophy of thyroid (acquired): Secondary | ICD-10-CM | POA: Diagnosis not present

## 2014-10-22 DIAGNOSIS — E038 Other specified hypothyroidism: Secondary | ICD-10-CM

## 2014-10-22 DIAGNOSIS — R739 Hyperglycemia, unspecified: Secondary | ICD-10-CM

## 2014-10-22 DIAGNOSIS — I10 Essential (primary) hypertension: Secondary | ICD-10-CM | POA: Diagnosis not present

## 2014-10-22 DIAGNOSIS — R627 Adult failure to thrive: Secondary | ICD-10-CM | POA: Diagnosis not present

## 2014-10-22 DIAGNOSIS — J439 Emphysema, unspecified: Secondary | ICD-10-CM | POA: Diagnosis not present

## 2014-10-22 DIAGNOSIS — F0391 Unspecified dementia with behavioral disturbance: Secondary | ICD-10-CM

## 2014-10-22 DIAGNOSIS — G8929 Other chronic pain: Secondary | ICD-10-CM | POA: Diagnosis not present

## 2014-10-22 DIAGNOSIS — F03918 Unspecified dementia, unspecified severity, with other behavioral disturbance: Secondary | ICD-10-CM

## 2014-10-22 DIAGNOSIS — F3175 Bipolar disorder, in partial remission, most recent episode depressed: Secondary | ICD-10-CM | POA: Diagnosis not present

## 2014-10-22 NOTE — Progress Notes (Signed)
Patient ID: [name], female   DOB: [birth year], 80 y.o.   MRN: [id number]  Location: [facility] Provider:  Rexene Edison. Mariea Clonts, D.O., C.M.D.  Code Status:  Full code, but MOST was completed with [name]  Chief Complaint  Patient presents with  . Medical Management of Chronic Issues    HPI:  80 yo white female long term care resident with h/o vascular dementia, bipolar disorder, COPD, blindness, hypothyroidism, dysphagia, anxiety, chronic diastolic chf was seen for med mgt of her chronic diseases.  She c/o chronic pain.  I have met with her son several times to discuss her goals of care and we did complete a MOST form, but he's been unable to come to terms with possibly losing her.  This will need to be readdressed as he does seem to be developing a better understanding that she is not going to get better from here, and we need to keep her comfortable and make her quality of life as good as it can be.  Please refer to MOST in her paper SNF chart.  Review of Systems:  Review of Systems  Constitutional: Positive for malaise/fatigue.  HENT: Negative for congestion and hearing loss.   Eyes:       Legally blind  Respiratory: Negative for shortness of breath.   Cardiovascular: Negative for chest pain and leg swelling.  Gastrointestinal: Negative for abdominal pain, constipation, blood in stool and melena.       Likes her coffee which keeps bowels moving  Genitourinary: Negative for dysuria.  Musculoskeletal: Positive for myalgias and joint pain. Negative for falls.  Neurological: Positive for weakness and headaches. Negative for dizziness.  Psychiatric/Behavioral: Positive for depression and memory loss. The patient is nervous/anxious.     Medications: Patient's Medications  New Prescriptions   No medications on file  Previous Medications   DOCUSATE (COLACE) 50 MG/5ML LIQUID    Take 100 mg by mouth 2 (two) times daily.   DONEPEZIL (ARICEPT) 10 MG TABLET    Take 10 mg by  mouth at bedtime.   DULOXETINE (CYMBALTA) 60 MG CAPSULE    Take 60 mg by mouth daily.   FAMOTIDINE (PEPCID) 20 MG TABLET    Take 1 tablet (20 mg total) by mouth 2 (two) times daily.   FUROSEMIDE (LASIX) 20 MG TABLET    Take 20 mg by mouth daily.   INSULIN ASPART (NOVOLOG) 100 UNIT/ML INJECTION    Inject 5 Units into the skin every 6 (six) hours. CBG >150   IPRATROPIUM-ALBUTEROL (DUONEB) 0.5-2.5 (3) MG/3ML SOLN    Take 3 mLs by nebulization every 4 (four) hours as needed.   LEVOTHYROXINE (SYNTHROID, LEVOTHROID) 125 MCG TABLET    Take 125 mcg by mouth daily before breakfast.   LIDOCAINE (LIDODERM) 5 %    Place 1 patch onto the skin daily. Apply 1/2 patch to each shoulder and to each knee for 12 hours, then remove   MELOXICAM (MOBIC) 15 MG TABLET    Take 15 mg by mouth daily.   MEMANTINE HCL ER (NAMENDA XR) 28 MG CP24    Take 1 capsule by mouth at bedtime.   MULTIPLE VITAMINS-MINERALS (CENTAMIN) LIQD    Take 5 mLs by mouth daily.   NUTRITIONAL SUPPLEMENTS (FEEDING SUPPLEMENT, OSMOLITE 1.5 CAL,) LIQD    Place 60 mLs into feeding tube continuous.   OXYCODONE (OXY-IR) 5 MG CAPSULE    Take 5 mg by mouth every 4 (four) hours as needed.   OXYCODONE (ROXICODONE) 5 MG  IMMEDIATE RELEASE TABLET    Take one tablet by mouth every 8 hours; Take one tablet by mouth every 4 hours as needed for pain   POLYETHYLENE GLYCOL (MIRALAX / GLYCOLAX) PACKET    Take 17 g by mouth daily.   PROPYLENE GLYCOL (SYSTANE BALANCE) 0.6 % SOLN    Apply 1 drop to eye 2 (two) times daily.   QUETIAPINE (SEROQUEL) 50 MG TABLET    Take 50 mg by mouth at bedtime.   VALPROIC ACID 500 MG CPDR    Take 500 mg by mouth 2 (two) times daily.  Modified Medications   No medications on file  Discontinued Medications   No medications on file    Physical Exam: Filed Vitals:   10/22/14 1630  BP: 118/66  Pulse: 64  Temp: 98.2 F (36.8 C)  Resp: 20  Height: _0  (1.753 m)  Weight: 150 lb (68.04 kg)  SpO2: 96%  Physical Exam    Constitutional:  Frail white female resting in bed-spends most of her time there  Neck:  Scar from prior tracheostomy  Cardiovascular: Normal rate, regular rhythm, normal heart sounds and intact distal pulses.   Pulmonary/Chest: Effort normal and breath sounds normal.  Abdominal: Soft. Bowel sounds are normal. She exhibits no distension and no mass. There is no tenderness.  Musculoskeletal:  Some contractures of extremities; tenderness of right shoulder with attempts at rom  Neurological: She is alert.  Oriented to person and place, not time  Skin: Skin is warm and dry. There is pallor.  Psychiatric: She has a normal mood and affect.  Very pleasant today    Labs reviewed: Basic Metabolic Panel:  Recent Labs  06/10/14 0400 06/11/14 0450  06/23/14 0500 06/26/14 0645 06/30/14 0614  NA 147 146  < > 144 142 139  K 3.6* 4.2  < > 4.5 4.7 4.2  CL 103 101  < > 100 100 99  CO2 31 31  < > 33* 30 32  GLUCOSE 113* 107*  < > 115* 73 110*  BUN 29* 26*  < > 33* 28* 23  CREATININE 0.58 0.44*  < > 0.59 0.60 0.55  CALCIUM 9.2 9.6  < > 9.6 9.1 9.2  MG 1.9 1.8  --  2.3  --   --   PHOS 3.4 2.3  --  2.9  --   --   < > = values in this interval not displayed.  Liver Function Tests:  Recent Labs  11/28/13 1530 06/12/14 0500 06/30/14 0614  AST _1 ALT _2 ALKPHOS 98 71 92  BILITOT <0.2* <0.2* <0.2*  PROT 6.1 6.6 6.8  ALBUMIN 2.5* 2.4* 2.6*    CBC:  Recent Labs  06/12/14 0500  06/23/14 0500 06/26/14 0645 06/30/14 0614  WBC 10.2  < > 6.3 5.5 5.1  NEUTROABS 6.5  --  3.1  --  2.3  HGB 10.4*  < > 10.7* 10.0* 10.8*  HCT 32.1*  < > 34.2* 32.1* 33.4*  MCV 103.2*  < > 105.6* 104.9* 106.4*  PLT 293  < > 330 302 234  < > = values in this interval not displayed.   Assessment/Plan 1. Chronic pain -cont current regimen--cymbalta has helped, lidoderm patches have helped, also on mobic (monitor kidneys), oxycodone q 4 hrs prn especially when she is getting up to go out with  her son or for shower, activity (does not happen often)  2. Bipolar disorder, in partial remission, most recent episode  depressed -continues on cymbalta, seroquel, depakote -needs depakote level and hba1c monitored  3. Dementia with behavioral disturbance -continues on aricept and namenda XR--memory is in moderate range--she is able to speak with staff and answer my questions usually appropriately if she wants to at the time -is able to feed herself some and drink some on her own  4. FTT (failure to thrive) in adult -it is difficult to maintain her weight--intake not great--cont supplements, tube feeding on thickened liquids   5. Pulmonary emphysema, unspecified emphysema type -cont duonebs, not able to do inhalers -no recent flares -also contributes to her FTT, and I don't think she'd come off a vent if she required intubation and mechanical ventilation  6. Hypothyroidism due to acquired atrophy of thyroid -cont levothyroxine 137mg and monitor tsh  7. Essential hypertension, benign -cont lasix only--monitor bp for goal of <150/90 in her frail state  8. Hyperglycemia -has previously been on insulin due to steroids and antipsychotics, f/u hba1c soon due to ongoing seroquel  Family/ staff Communication: spoke with [name]  Goals of care: see MOST in SNF chart  Labs/tests ordered:  No new today

## 2014-10-24 ENCOUNTER — Non-Acute Institutional Stay (SKILLED_NURSING_FACILITY): Payer: Medicare Other | Admitting: Adult Health

## 2014-10-24 DIAGNOSIS — J449 Chronic obstructive pulmonary disease, unspecified: Secondary | ICD-10-CM

## 2014-10-24 DIAGNOSIS — K5900 Constipation, unspecified: Secondary | ICD-10-CM

## 2014-10-24 DIAGNOSIS — F03918 Unspecified dementia, unspecified severity, with other behavioral disturbance: Secondary | ICD-10-CM

## 2014-10-24 DIAGNOSIS — F0391 Unspecified dementia with behavioral disturbance: Secondary | ICD-10-CM

## 2014-10-24 DIAGNOSIS — I5032 Chronic diastolic (congestive) heart failure: Secondary | ICD-10-CM

## 2014-10-24 DIAGNOSIS — E034 Atrophy of thyroid (acquired): Secondary | ICD-10-CM

## 2014-10-24 DIAGNOSIS — E038 Other specified hypothyroidism: Secondary | ICD-10-CM

## 2014-10-24 DIAGNOSIS — R1310 Dysphagia, unspecified: Secondary | ICD-10-CM

## 2014-10-24 DIAGNOSIS — G8929 Other chronic pain: Secondary | ICD-10-CM

## 2014-10-24 DIAGNOSIS — F3175 Bipolar disorder, in partial remission, most recent episode depressed: Secondary | ICD-10-CM

## 2014-10-29 ENCOUNTER — Encounter: Payer: Self-pay | Admitting: Adult Health

## 2014-10-29 DIAGNOSIS — I5033 Acute on chronic diastolic (congestive) heart failure: Secondary | ICD-10-CM | POA: Insufficient documentation

## 2014-10-29 NOTE — Progress Notes (Signed)
Patient ID: [name], female   DOB: 05/17/1928, 80 y.o.   MRN: [id number]   [facility]     No Known Allergies     Chief Complaint  Patient presents with  . Medical Management of Chronic Issues    HPI:  She is a long term resident of this facility being seen for the management of her chronic illnesses. She has a feeding tube for nutritional support. She is weak; is unable to fully participate in the hpi or ros. She does recognize familiar people. There are no nursing concerns at this time.    Past Medical History  Diagnosis Date  . COPD (chronic obstructive pulmonary disease)   . Hypertension   . Blind   . Hypothyroid   . Pacemaker   . Renal insufficiency   . Finger fracture   . Dementia   . Bipolar disorder   . Dysphagia     needs thick nectar liquids  . Atrial fibrillation   . Pneumonia   . Anxiety   . Diastolic heart failure     Past Surgical History  Procedure Laterality Date  . Insert / replace / remove pacemaker    . Tracheostomy  02/27/13    decannulated 03/29/13  . Gastrostomy tube placement  03/11/13    VITAL SIGNS BP 118/66 mmHg  Pulse 64  Ht 5\' 9"  (1.753 m)  Wt 148 lb (67.132 kg)  BMI 21.85 kg/m2  SpO2 96%   Outpatient Encounter Prescriptions as of 10/24/2014  Medication Sig  . docusate (COLACE) 50 MG/5ML liquid Take 100 mg by mouth 2 (two) times daily.  . DULoxetine (CYMBALTA) 60 MG capsule Take 60 mg by mouth daily.  . famotidine (PEPCID) 20 MG tablet Take 1 tablet (20 mg total) by mouth 2 (two) times daily.  . furosemide (LASIX) 20 MG tablet Take 20 mg by mouth daily.  Marland Kitchen. ipratropium-albuterol (DUONEB) 0.5-2.5 (3) MG/3ML SOLN Take 3 mLs by nebulization every 4 (four) hours as needed. (Patient taking differently: Take 3 mLs by nebulization 3 (three) times daily. )  . levothyroxine (SYNTHROID, LEVOTHROID) 125 MCG tablet Take 125 mcg by mouth daily before breakfast.  . lidocaine (LIDODERM) 5 % Place 1 patch onto the skin daily. Apply 1/2  patch to each shoulder and to each knee for 12 hours, then remove  . meloxicam (MOBIC) 15 MG tablet Take 15 mg by mouth daily.  . Memantine HCl-Donepezil HCl (NAMZARIC) 28-10 MG CP24 28 capsules by PEG Tube route daily.  . Multiple Vitamins-Minerals (CENTAMIN) LIQD Take 5 mLs by mouth daily.  . Nutritional Supplements (FEEDING SUPPLEMENT, OSMOLITE 1.5 CAL,) LIQD Place 60 mLs into feeding tube continuous.  Marland Kitchen. oxycodone (OXY-IR) 5 MG capsule Take 5 mg by mouth every 4 (four) hours as needed.  Marland Kitchen. oxyCODONE (ROXICODONE) 5 MG immediate release tablet Take one tablet by mouth every 8 hours; Take one tablet by mouth every 4 hours as needed for pain  . polyethylene glycol (MIRALAX / GLYCOLAX) packet Take 17 g by mouth daily.  Marland Kitchen. Propylene Glycol (SYSTANE BALANCE) 0.6 % SOLN Apply 1 drop to eye 2 (two) times daily.  . QUEtiapine (SEROQUEL) 50 MG tablet Take 50 mg by mouth at bedtime.  . Valproic Acid 500 MG CPDR Take 500 mg by mouth 2 (two) times daily.  . [DISCONTINUED] donepezil (ARICEPT) 10 MG tablet Take 10 mg by mouth at bedtime.  . [DISCONTINUED] insulin aspart (NOVOLOG) 100 UNIT/ML injection Inject 5 Units into the skin every 6 (six) hours. CBG >150  . [  DISCONTINUED] Memantine HCl ER (NAMENDA XR) 28 MG CP24 Take 1 capsule by mouth at bedtime.     SIGNIFICANT DIAGNOSTIC EXAMS    LABS REVIEWED:   10-18-14: wbc 7.9; hgb 12.1; hct 39.7 ;mcv 102.6;plt 201; glucose 88; bun 29.9; creat 0.51; k+4.3; na++140; liver normal albumin 3.1; tsh 1.53; pre-albumin 19; depakote 31    Review of Systems  Unable to perform ROS    Physical Exam  Constitutional: No distress.  Frail   Neck: Neck supple. No JVD present. No thyromegaly present.  Cardiovascular: Normal rate, regular rhythm and intact distal pulses.   Respiratory: Effort normal and breath sounds normal. No respiratory distress.  GI: Soft. Bowel sounds are normal. She exhibits no distension. There is no tenderness.  Has peg tube    Musculoskeletal: She exhibits no edema.  Is able to move extremities; has generalized weakness present   Neurological: She is alert.  Skin: Skin is warm and dry. She is not diaphoretic.     ASSESSMENT/ PLAN:  1. Dysphagia: she is on nectar thick liquids and has peg tube feedings. There are no signs of aspiration present. Will not make changes will monitor her status.   2.  COPD: no change in her status: does require 02. Will continue duoneb three times daily will monitor her status.   3. Chronic pain in multiple joints: her pain is presently managed; will continue mobic 15 mg daily; oxycodone 5 mg every 8 hours routinely and every 4 hours as needed; will  Continue lidoderm patch 1/2 patch to each shoulder and knee daily; will continue cymbalta 60 mg daily and will monitor   4. Hypothyroidism; her tsh is 1.53; will continue synthroid 125 mcg daily   5. Diastolic heart failure: will continue lasix 20 mg daily and will monitor   6. Dementia with behavioral disturbance: no significant change in her status; will continue namzareic 28/10 mg daily and will monitor her status.   7. Bipolar affective disorder: will continue seroquel 50 mg daily; cymbalta 60 mg daily; and depakote 500 mg twice daily and will monitor   8. Constipation: will continue miralax daily  9. Gerd: will continue pepcid 20 mg twice daily         Synthia Innocenteborah Kwame Ryland NP [facility]  Contact [phone] Monday through Friday 8am- 5pm  After hours call [phone]

## 2014-11-30 ENCOUNTER — Non-Acute Institutional Stay (SKILLED_NURSING_FACILITY): Payer: Medicare Other | Admitting: Internal Medicine

## 2014-11-30 ENCOUNTER — Encounter: Payer: Self-pay | Admitting: Internal Medicine

## 2014-11-30 DIAGNOSIS — G8929 Other chronic pain: Secondary | ICD-10-CM

## 2014-11-30 DIAGNOSIS — R1310 Dysphagia, unspecified: Secondary | ICD-10-CM

## 2014-11-30 DIAGNOSIS — E034 Atrophy of thyroid (acquired): Secondary | ICD-10-CM

## 2014-11-30 DIAGNOSIS — F03918 Unspecified dementia, unspecified severity, with other behavioral disturbance: Secondary | ICD-10-CM

## 2014-11-30 DIAGNOSIS — I10 Essential (primary) hypertension: Secondary | ICD-10-CM

## 2014-11-30 DIAGNOSIS — F0391 Unspecified dementia with behavioral disturbance: Secondary | ICD-10-CM

## 2014-11-30 DIAGNOSIS — E038 Other specified hypothyroidism: Secondary | ICD-10-CM

## 2014-11-30 DIAGNOSIS — J449 Chronic obstructive pulmonary disease, unspecified: Secondary | ICD-10-CM

## 2014-11-30 NOTE — Progress Notes (Signed)
Patient ID: [name], female   DOB: 01/30/1928, 81 y.o.   MRN: [id number]    Facility  [facility]    Place of Service:   SNF   No Known Allergies  Chief Complaint  Patient presents with  . Medical Management of Chronic Issues    dysphagia, COPD, chronic pain syndrome, hypothyroidism, diastolic heart failure, dementia with behavior issues, bipolar affective d/o, constipation and GERD    HPI:  81 yo female long term resident seen today for above. She c/o reduced appetite and generalized pain. Pain interrupts sleep. No other concerns. Denies CP or new SOB. She has [place] O2 ATC. No nursing issues.  Medications: Patient's Medications  New Prescriptions   No medications on file  Previous Medications   DOCUSATE (COLACE) 50 MG/5ML LIQUID    Take 100 mg by mouth 2 (two) times daily.   DULOXETINE (CYMBALTA) 60 MG CAPSULE    Take 60 mg by mouth daily.   FAMOTIDINE (PEPCID) 20 MG TABLET    Take 1 tablet (20 mg total) by mouth 2 (two) times daily.   FUROSEMIDE (LASIX) 20 MG TABLET    Take 20 mg by mouth daily.   IPRATROPIUM-ALBUTEROL (DUONEB) 0.5-2.5 (3) MG/3ML SOLN    Take 3 mLs by nebulization every 4 (four) hours as needed.   LEVOTHYROXINE (SYNTHROID, LEVOTHROID) 125 MCG TABLET    Take 125 mcg by mouth daily before breakfast.   LIDOCAINE (LIDODERM) 5 %    Place 1 patch onto the skin daily. Apply 1/2 patch to each shoulder and to each knee for 12 hours, then remove   MELOXICAM (MOBIC) 15 MG TABLET    Take 15 mg by mouth daily.   MEMANTINE HCL-DONEPEZIL HCL (NAMZARIC) 28-10 MG CP24    28 capsules by PEG Tube route daily.   MULTIPLE VITAMINS-MINERALS (CENTAMIN) LIQD    Take 5 mLs by mouth daily.   NUTRITIONAL SUPPLEMENTS (FEEDING SUPPLEMENT, OSMOLITE 1.5 CAL,) LIQD    Place 60 mLs into feeding tube continuous.   OXYCODONE (OXY-IR) 5 MG CAPSULE    Take 5 mg by mouth every 4 (four) hours as needed.   OXYCODONE (ROXICODONE) 5 MG IMMEDIATE RELEASE TABLET    Take one tablet by mouth  every 8 hours; Take one tablet by mouth every 4 hours as needed for pain   POLYETHYLENE GLYCOL (MIRALAX / GLYCOLAX) PACKET    Take 17 g by mouth daily.   PROPYLENE GLYCOL (SYSTANE BALANCE) 0.6 % SOLN    Apply 1 drop to eye 2 (two) times daily.   QUETIAPINE (SEROQUEL) 50 MG TABLET    Take 50 mg by mouth at bedtime.   VALPROIC ACID 500 MG CPDR    Take 500 mg by mouth 2 (two) times daily.  Modified Medications   No medications on file  Discontinued Medications   No medications on file     Review of Systems  As above. All other systems reviewed are negative  Filed Vitals:   11/30/14 0024  BP: 142/78  Pulse: 70  Temp: 97.1 F (36.2 C)  SpO2: 96%   There is no weight on file to calculate BMI.  Physical Exam CONSTITUTIONAL: Looks frail in NAD. Awake, alert and oriented x 3. [place] O2 intact HEENT: PERRLA. Oropharynx clear and without exudate NECK: Supple. Nontender. No palpable cervical or supraclavicular lymph nodes. No carotid bruit b/l.  CVS: Regular rate with 1/6 SEmurmur. No gallop or rub. LUNGS: CTA b/l no wheezing, rales or rhonchi. ABDOMEN: Bowel sounds  present x 4. Soft, nontender, nondistended. No palpable mass or bruit. (+) Peg tube intact and without redness at insertion site EXTREMITIES: Trace LE edema b/l. Distal pulses palpable. No calf tenderness PSYCH: Affect, behavior and mood normal MUSC: small and large joint swelling with reduced ROM   Labs reviewed: No visits with results within 3 Month(s) from this visit.    Assessment/Plan   ICD-9-CM ICD-10-CM   1. Chronic pain 338.29 G89.29   2. Dementia with behavioral disturbance 294.21 F03.91   3. Chronic obstructive pulmonary disease, unspecified COPD, unspecified chronic bronchitis type 496 J44.9   4. Hypothyroidism due to acquired atrophy of thyroid 244.8 E03.8    246.8 E03.4   5. Essential hypertension, benign 401.1 I10   6. Dysphagia 787.20 R13.10    --she needs better pain control. Increase oxycodone IR to  q6hrs and hold for sedation  --continue all other medications as ordered  --continue [place] O2 as ordered  --CBGs 110s-130s. Will continue BID CBGs and record   Georgianna Band S. Ancil Linsey  [facility] [address] [place], [place] [address] [phone] Office (Wednesdays and Fridays 8 AM - 5 PM) [phone] Cell (Monday-Friday 8 AM - 5 PM)

## 2015-01-01 ENCOUNTER — Non-Acute Institutional Stay (SKILLED_NURSING_FACILITY): Payer: Medicare Other | Admitting: Adult Health

## 2015-01-01 DIAGNOSIS — E034 Atrophy of thyroid (acquired): Secondary | ICD-10-CM | POA: Diagnosis not present

## 2015-01-01 DIAGNOSIS — G8929 Other chronic pain: Secondary | ICD-10-CM | POA: Diagnosis not present

## 2015-01-01 DIAGNOSIS — E038 Other specified hypothyroidism: Secondary | ICD-10-CM

## 2015-01-01 DIAGNOSIS — R1310 Dysphagia, unspecified: Secondary | ICD-10-CM | POA: Diagnosis not present

## 2015-01-01 DIAGNOSIS — I5032 Chronic diastolic (congestive) heart failure: Secondary | ICD-10-CM | POA: Diagnosis not present

## 2015-01-01 DIAGNOSIS — F0391 Unspecified dementia with behavioral disturbance: Secondary | ICD-10-CM

## 2015-01-01 DIAGNOSIS — J449 Chronic obstructive pulmonary disease, unspecified: Secondary | ICD-10-CM | POA: Diagnosis not present

## 2015-01-01 DIAGNOSIS — K5900 Constipation, unspecified: Secondary | ICD-10-CM

## 2015-01-01 DIAGNOSIS — F3175 Bipolar disorder, in partial remission, most recent episode depressed: Secondary | ICD-10-CM

## 2015-01-01 DIAGNOSIS — F03918 Unspecified dementia, unspecified severity, with other behavioral disturbance: Secondary | ICD-10-CM

## 2015-02-09 ENCOUNTER — Non-Acute Institutional Stay (SKILLED_NURSING_FACILITY): Payer: Medicare Other | Admitting: Adult Health

## 2015-02-09 DIAGNOSIS — F3175 Bipolar disorder, in partial remission, most recent episode depressed: Secondary | ICD-10-CM | POA: Diagnosis not present

## 2015-02-09 DIAGNOSIS — G8929 Other chronic pain: Secondary | ICD-10-CM | POA: Diagnosis not present

## 2015-02-09 DIAGNOSIS — Z931 Gastrostomy status: Secondary | ICD-10-CM

## 2015-02-09 DIAGNOSIS — E038 Other specified hypothyroidism: Secondary | ICD-10-CM | POA: Diagnosis not present

## 2015-02-09 DIAGNOSIS — R1310 Dysphagia, unspecified: Secondary | ICD-10-CM | POA: Diagnosis not present

## 2015-02-09 DIAGNOSIS — R627 Adult failure to thrive: Secondary | ICD-10-CM | POA: Diagnosis not present

## 2015-02-09 DIAGNOSIS — J449 Chronic obstructive pulmonary disease, unspecified: Secondary | ICD-10-CM

## 2015-02-09 DIAGNOSIS — I5032 Chronic diastolic (congestive) heart failure: Secondary | ICD-10-CM | POA: Diagnosis not present

## 2015-02-09 DIAGNOSIS — Z950 Presence of cardiac pacemaker: Secondary | ICD-10-CM | POA: Diagnosis not present

## 2015-02-09 DIAGNOSIS — E034 Atrophy of thyroid (acquired): Secondary | ICD-10-CM | POA: Diagnosis not present

## 2015-02-09 DIAGNOSIS — K5900 Constipation, unspecified: Secondary | ICD-10-CM

## 2015-02-10 ENCOUNTER — Encounter: Payer: Self-pay | Admitting: Adult Health

## 2015-02-10 NOTE — Progress Notes (Signed)
Patient ID: [name], female   DOB: May 24, 1928, 81 y.o.   MRN: [id number]  [facility]     No Known Allergies     Chief Complaint  Patient presents with  . Medical Management of Chronic Issues    HPI:  She is a long term resident of this facility being seen for the management of her chronic illnesses. Overall her status is without significant change in status. She is unable to fully participate in the hpi or ros. There are no nursing concerns at this time.    Past Medical History  Diagnosis Date  . COPD (chronic obstructive pulmonary disease)   . Hypertension   . Blind   . Hypothyroid   . Pacemaker   . Renal insufficiency   . Finger fracture   . Dementia   . Bipolar disorder   . Dysphagia     needs thick nectar liquids  . Atrial fibrillation   . Pneumonia   . Anxiety   . Diastolic heart failure     Past Surgical History  Procedure Laterality Date  . Insert / replace / remove pacemaker    . Tracheostomy  02/27/13    decannulated 03/29/13  . Gastrostomy tube placement  03/11/13    VITAL SIGNS BP 130/82 mmHg  Pulse 76  Ht  (1.651 m)  Wt 150 lb (68.04 kg)  BMI 24.96 kg/m2  SpO2 97%   Outpatient Encounter Prescriptions as of 01/01/2015  Medication Sig  . docusate (COLACE) 50 MG/5ML liquid Take 100 mg by mouth 2 (two) times daily.  . DULoxetine (CYMBALTA) 60 MG capsule Take 60 mg by mouth daily.  . famotidine (PEPCID) 20 MG tablet Take 1 tablet (20 mg total) by mouth 2 (two) times daily.  . furosemide (LASIX) 20 MG tablet Take 20 mg by mouth daily.  Marland Kitchen ipratropium-albuterol (DUONEB) 0.5-2.5 (3) MG/3ML SOLN Take 3 mLs by nebulization every 4 (four) hours as needed. (Patient taking differently: Take 3 mLs by nebulization 3 (three) times daily. )  . levothyroxine (SYNTHROID, LEVOTHROID) 125 MCG tablet Take 125 mcg by mouth daily before breakfast.  . lidocaine (LIDODERM) 5 % Place 1 patch onto the skin daily. Apply 1/2 patch to each shoulder and to each knee  for 12 hours, then remove  . meloxicam (MOBIC) 15 MG tablet Take 15 mg by mouth daily.  . Memantine HCl-Donepezil HCl (NAMZARIC) 28-10 MG CP24 28 capsules by PEG Tube route daily.  . Multiple Vitamins-Minerals (CENTAMIN) LIQD Take 5 mLs by mouth daily.  . Nutritional Supplements (FEEDING SUPPLEMENT, OSMOLITE 1.5 CAL,) LIQD Place 60 mLs into feeding tube continuous.  Marland Kitchen oxycodone (OXY-IR) 5 MG capsule Take 5 mg by mouth every 4 (four) hours as needed.  Marland Kitchen oxyCODONE (ROXICODONE) 5 MG immediate release tablet Take one tablet by mouth every 8 hours; Take one tablet by mouth every 4 hours as needed for pain  . polyethylene glycol (MIRALAX / GLYCOLAX) packet Take 17 g by mouth daily.  Marland Kitchen Propylene Glycol (SYSTANE BALANCE) 0.6 % SOLN Apply 1 drop to eye 2 (two) times daily.  . QUEtiapine (SEROQUEL) 50 MG tablet Take 50 mg by mouth at bedtime.  . Valproic Acid 500 MG CPDR Take 500 mg by mouth 2 (two) times daily.     SIGNIFICANT DIAGNOSTIC EXAMS   LABS REVIEWED:   10-18-14: wbc 7.9; hgb 12.1; hct 39.7 ;mcv 102.6;plt 201; glucose 88; bun 29.9; creat 0.51; k+4.3; na++140; liver normal albumin 3.1; tsh 1.53; pre-albumin 19; depakote 31 10-27-14: hgb  a1c 5.4     ROS Unable to perform ROS    Physical Exam Constitutional: No distress.  Frail  Neck: Neck supple. No JVD present. No thyromegaly present.  Cardiovascular: Normal rate, regular rhythm and intact distal pulses.   Respiratory: Effort normal and breath sounds normal. No respiratory distress.  GI: Soft. Bowel sounds are normal. She exhibits no distension. There is no tenderness.  Has peg tube  Musculoskeletal: She exhibits no edema.  Is able to move extremities; has generalized weakness present   Neurological: She is alert.  Skin: Skin is warm and dry. She is not diaphoretic.       ASSESSMENT/ PLAN:   1. Dysphagia: she is on nectar thick liquids and has peg tube feedings. There are no signs of aspiration present. Will not make  changes will monitor her status.   2.  COPD: no change in her status: does require 02. Will continue duoneb three times daily will monitor her status.   3. Chronic pain in multiple joints: her pain is presently managed; will continue mobic 15 mg daily; oxycodone 5 mg every 8 hours routinely and every 4 hours as needed; will  Continue lidoderm patch 1/2 patch to each shoulder and knee daily; will continue cymbalta 60 mg daily and will monitor   4. Hypothyroidism; her tsh is 1.53; will continue synthroid 125 mcg daily   5. Diastolic heart failure: will continue lasix 20 mg daily and will monitor   6. Dementia with behavioral disturbance: no significant change in her status; will continue namzareic 28/10 mg daily and will monitor her status.   7. Bipolar affective disorder: will continue seroquel 50 mg daily; cymbalta 60 mg daily; and depakote 500 mg twice daily and will monitor   8. Constipation: will continue miralax daily  9. Gerd: will continue pepcid 20 mg twice daily     Synthia Innocenteborah Tyreik Delahoussaye NP [facility]  Contact [phone] Monday through Friday 8am- 5pm  After hours call [phone]

## 2015-02-12 ENCOUNTER — Non-Acute Institutional Stay (SKILLED_NURSING_FACILITY): Payer: Medicare Other | Admitting: Adult Health

## 2015-02-12 DIAGNOSIS — R1310 Dysphagia, unspecified: Secondary | ICD-10-CM | POA: Diagnosis not present

## 2015-02-12 DIAGNOSIS — J449 Chronic obstructive pulmonary disease, unspecified: Secondary | ICD-10-CM | POA: Diagnosis not present

## 2015-02-12 DIAGNOSIS — I5032 Chronic diastolic (congestive) heart failure: Secondary | ICD-10-CM

## 2015-02-12 DIAGNOSIS — E038 Other specified hypothyroidism: Secondary | ICD-10-CM

## 2015-02-12 DIAGNOSIS — E034 Atrophy of thyroid (acquired): Secondary | ICD-10-CM

## 2015-02-12 DIAGNOSIS — F03918 Unspecified dementia, unspecified severity, with other behavioral disturbance: Secondary | ICD-10-CM

## 2015-02-12 DIAGNOSIS — F3175 Bipolar disorder, in partial remission, most recent episode depressed: Secondary | ICD-10-CM

## 2015-02-12 DIAGNOSIS — F0391 Unspecified dementia with behavioral disturbance: Secondary | ICD-10-CM

## 2015-02-12 DIAGNOSIS — G8929 Other chronic pain: Secondary | ICD-10-CM | POA: Diagnosis not present

## 2015-02-13 LAB — HEMOGLOBIN A1C: HEMOGLOBIN A1C: 5.3 % (ref 4.0–6.0)

## 2015-02-13 LAB — TSH: TSH: 1.23 u[IU]/mL (ref <=5.90)

## 2015-03-12 ENCOUNTER — Encounter: Payer: Self-pay | Admitting: Adult Health

## 2015-03-12 ENCOUNTER — Non-Acute Institutional Stay (SKILLED_NURSING_FACILITY): Payer: Medicare Other | Admitting: Adult Health

## 2015-03-12 DIAGNOSIS — K5900 Constipation, unspecified: Secondary | ICD-10-CM | POA: Diagnosis not present

## 2015-03-12 DIAGNOSIS — R627 Adult failure to thrive: Secondary | ICD-10-CM | POA: Diagnosis not present

## 2015-03-12 DIAGNOSIS — E034 Atrophy of thyroid (acquired): Secondary | ICD-10-CM

## 2015-03-12 DIAGNOSIS — R1310 Dysphagia, unspecified: Secondary | ICD-10-CM

## 2015-03-12 DIAGNOSIS — I10 Essential (primary) hypertension: Secondary | ICD-10-CM

## 2015-03-12 DIAGNOSIS — I5032 Chronic diastolic (congestive) heart failure: Secondary | ICD-10-CM | POA: Diagnosis not present

## 2015-03-12 DIAGNOSIS — G8929 Other chronic pain: Secondary | ICD-10-CM | POA: Diagnosis not present

## 2015-03-12 DIAGNOSIS — F0391 Unspecified dementia with behavioral disturbance: Secondary | ICD-10-CM

## 2015-03-12 DIAGNOSIS — E038 Other specified hypothyroidism: Secondary | ICD-10-CM | POA: Diagnosis not present

## 2015-03-12 DIAGNOSIS — F03918 Unspecified dementia, unspecified severity, with other behavioral disturbance: Secondary | ICD-10-CM

## 2015-03-12 DIAGNOSIS — J439 Emphysema, unspecified: Secondary | ICD-10-CM | POA: Diagnosis not present

## 2015-03-12 DIAGNOSIS — F3175 Bipolar disorder, in partial remission, most recent episode depressed: Secondary | ICD-10-CM | POA: Diagnosis not present

## 2015-03-12 NOTE — Progress Notes (Signed)
Patient ID: [name], female   DOB: 23-Sep-1928, 81 y.o.   MRN: [id number]  [facility]     No Known Allergies     Chief Complaint  Patient presents with  . Medical Management of Chronic Issues    HPI:  She is a long term resident of this facility being seen for the management of her chronic illnesses. Overall her status remains without change. She cannot fully participate in the phi or ros; but does states her shoulders are stiff. There are no nursing concerns at this time    Past Medical History  Diagnosis Date  . COPD (chronic obstructive pulmonary disease)   . Hypertension   . Blind   . Hypothyroid   . Pacemaker   . Renal insufficiency   . Finger fracture   . Dementia   . Bipolar disorder   . Dysphagia     needs thick nectar liquids  . Atrial fibrillation   . Pneumonia   . Anxiety   . Diastolic heart failure     Past Surgical History  Procedure Laterality Date  . Insert / replace / remove pacemaker    . Tracheostomy  02/27/13    decannulated 03/29/13  . Gastrostomy tube placement  03/11/13    VITAL SIGNS BP 122/76 mmHg  Pulse 76  Ht  (1.651 m)  Wt 154 lb (69.854 kg)  BMI 25.63 kg/m2  SpO2 97%   Outpatient Encounter Prescriptions as of 03/12/2015  Medication Sig  . docusate (COLACE) 50 MG/5ML liquid Take 100 mg by mouth 2 (two) times daily.  . DULoxetine (CYMBALTA) 60 MG capsule Take 60 mg by mouth daily.  . famotidine (PEPCID) 20 MG tablet Take 1 tablet (20 mg total) by mouth 2 (two) times daily.  . food thickener (THICK IT) POWD Take 1 Container by mouth as needed. Nectar thick  . furosemide (LASIX) 20 MG tablet Take 20 mg by mouth daily.  Marland Kitchen ipratropium-albuterol (DUONEB) 0.5-2.5 (3) MG/3ML SOLN Take 3 mLs by nebulization every 4 (four) hours as needed. (Patient taking differently: Take 3 mLs by nebulization 3 (three) times daily. )  . levothyroxine (SYNTHROID, LEVOTHROID) 125 MCG tablet Take 125 mcg by mouth daily before breakfast.  .  lidocaine (LIDODERM) 5 % Place 1 patch onto the skin daily. Apply 1/2 patch to each shoulder and to each knee for 12 hours, then remove  . meloxicam (MOBIC) 15 MG tablet Take 15 mg by mouth daily.  . Memantine HCl-Donepezil HCl (NAMZARIC) 28-10 MG CP24 28 capsules by PEG Tube route daily.  . Multiple Vitamins-Minerals (CENTAMIN) LIQD Take 5 mLs by mouth daily.  . Nutritional Supplements (FEEDING SUPPLEMENT, OSMOLITE 1.5 CAL,) LIQD Place 60 mLs into feeding tube continuous.  Marland Kitchen oxyCODONE (OXY IR/ROXICODONE) 5 MG immediate release tablet Take 5 mg by mouth every 6 (six) hours. And every 4 hours as needed  . polyethylene glycol (MIRALAX / GLYCOLAX) packet Take 17 g by mouth daily.  Marland Kitchen Propylene Glycol (SYSTANE BALANCE) 0.6 % SOLN Apply 1 drop to eye 2 (two) times daily.  . QUEtiapine (SEROQUEL) 50 MG tablet Take 50 mg by mouth at bedtime.  . Valproic Acid 500 MG CPDR Take 500 mg by mouth 2 (two) times daily.     SIGNIFICANT DIAGNOSTIC EXAMS   LABS REVIEWED:   10-18-14: wbc 7.9; hgb 12.1; hct 39.7 ;mcv 102.6;plt 201; glucose 88; bun 29.9; creat 0.51; k+4.3; na++140; liver normal albumin 3.1; tsh 1.53; pre-albumin 19; depakote 31 10-27-14: hgb a1c 5.4  02-13-15:  tsh 1.23; hgb a1c 5.3      ROS Unable to perform ROS    Physical Exam Constitutional: No distress.  Frail  Neck: Neck supple. No JVD present. No thyromegaly present.  Cardiovascular: Normal rate, regular rhythm and intact distal pulses.   Respiratory: Effort normal and breath sounds normal. No respiratory distress.  GI: Soft. Bowel sounds are normal. She exhibits no distension. There is no tenderness.  Has peg tube  Musculoskeletal: She exhibits no edema.  Is able to move extremities; has generalized weakness present   Neurological: She is alert.  Skin: Skin is warm and dry. She is not diaphoretic. Old trach site without signs of infection present.        ASSESSMENT/ PLAN:   1. Dysphagia: she is on nectar thick  liquids and is dependent upon  peg tube feedings. There are no signs of aspiration present. Will not make changes will monitor her status.   2.  COPD: no change in her status: does require 02. Will continue duoneb three times daily will monitor her status.   3. Chronic pain in multiple joints: her pain is presently managed; will continue mobic 15 mg daily; oxycodone 5 mg every 6 hours routinely and every 4 hours as needed; will  Continue lidoderm patch 1/2 patch to each shoulder and knee daily; will continue cymbalta 60 mg daily and will monitor   4. Hypothyroidism; her tsh is 1.23; will continue synthroid 125 mcg daily   5. Diastolic heart failure: will continue lasix 20 mg daily and will monitor does have a pacemaker   6. Dementia with behavioral disturbance: no significant change in her status; will continue namzareic 28/10 mg daily and will monitor her status.   7. Bipolar affective disorder: will continue seroquel 50 mg daily; cymbalta 60 mg daily; and depakote 500 mg twice daily and will monitor   8. Constipation: will continue miralax daily and colace twice daily   9. Gerd: will continue pepcid 20 mg twice daily      Melanie Innocenteborah Billee Balcerzak NP [facility]  Contact [phone] Monday through Friday 8am- 5pm  After hours call [phone]

## 2015-03-12 NOTE — Progress Notes (Signed)
Patient ID: [name], female   DOB: 1927-12-02, 81 y.o.   MRN: [id number]  [facility]     No Known Allergies     Chief Complaint  Patient presents with  . Medical Management of Chronic Issues    HPI:  She is a long term resident of this facility being for the management of her chronic illnesses. Overall there is little change in her status. She tells me that she is not hurting; but cannot fully participate in the hpi or ros. There are no nursing staff concerns at this time.    Past Medical History  Diagnosis Date  . COPD (chronic obstructive pulmonary disease)   . Hypertension   . Blind   . Hypothyroid   . Pacemaker   . Renal insufficiency   . Finger fracture   . Dementia   . Bipolar disorder   . Dysphagia     needs thick nectar liquids  . Atrial fibrillation   . Pneumonia   . Anxiety   . Diastolic heart failure     Past Surgical History  Procedure Laterality Date  . Insert / replace / remove pacemaker    . Tracheostomy  02/27/13    decannulated 03/29/13  . Gastrostomy tube placement  03/11/13    VITAL SIGNS BP 128/79 mmHg  Pulse 70  Ht  (1.651 m)  Wt 150 lb (68.04 kg)  BMI 24.96 kg/m2   Outpatient Encounter Prescriptions as of 02/09/2015  Medication Sig  . docusate (COLACE) 50 MG/5ML liquid Take 100 mg by mouth 2 (two) times daily.  . DULoxetine (CYMBALTA) 60 MG capsule Take 60 mg by mouth daily.  . famotidine (PEPCID) 20 MG tablet Take 1 tablet (20 mg total) by mouth 2 (two) times daily.  . furosemide (LASIX) 20 MG tablet Take 20 mg by mouth daily.  Marland Kitchen ipratropium-albuterol (DUONEB) 0.5-2.5 (3) MG/3ML SOLN Take 3 mLs by nebulization every 4 (four) hours as needed. (Patient taking differently: Take 3 mLs by nebulization 3 (three) times daily. )  . levothyroxine (SYNTHROID, LEVOTHROID) 125 MCG tablet Take 125 mcg by mouth daily before breakfast.  . lidocaine (LIDODERM) 5 % Place 1 patch onto the skin daily. Apply 1/2 patch to each shoulder and to  each knee for 12 hours, then remove  . meloxicam (MOBIC) 15 MG tablet Take 15 mg by mouth daily.  . Memantine HCl-Donepezil HCl (NAMZARIC) 28-10 MG CP24 28 capsules by PEG Tube route daily.  . Multiple Vitamins-Minerals (CENTAMIN) LIQD Take 5 mLs by mouth daily.  . Nutritional Supplements (FEEDING SUPPLEMENT, OSMOLITE 1.5 CAL,) LIQD Place 60 mLs into feeding tube continuous.  Marland Kitchen oxycodone (OXY-IR) 5 MG capsule Take 5 mg by mouth every 4 (four) hours as needed.  Marland Kitchen oxyCODONE (ROXICODONE) 5 MG immediate release tablet Take one tablet by mouth every 8 hours; Take one tablet by mouth every 4 hours as needed for pain  . polyethylene glycol (MIRALAX / GLYCOLAX) packet Take 17 g by mouth daily.  Marland Kitchen Propylene Glycol (SYSTANE BALANCE) 0.6 % SOLN Apply 1 drop to eye 2 (two) times daily.  . QUEtiapine (SEROQUEL) 50 MG tablet Take 50 mg by mouth at bedtime.  . Valproic Acid 500 MG CPDR Take 500 mg by mouth 2 (two) times daily.     SIGNIFICANT DIAGNOSTIC EXAMS    LABS REVIEWED:   10-18-14: wbc 7.9; hgb 12.1; hct 39.7 ;mcv 102.6;plt 201; glucose 88; bun 29.9; creat 0.51; k+4.3; na++140; liver normal albumin 3.1; tsh 1.53; pre-albumin 19; depakote  31 10-27-14: hgb a1c 5.4     ROS Unable to perform ROS    Physical Exam Constitutional: No distress.  Frail  Neck: Neck supple. No JVD present. No thyromegaly present.  Cardiovascular: Normal rate, regular rhythm and intact distal pulses.   Respiratory: Effort normal and breath sounds normal. No respiratory distress.  GI: Soft. Bowel sounds are normal. She exhibits no distension. There is no tenderness.  Has peg tube  Musculoskeletal: She exhibits no edema.  Is able to move extremities; has generalized weakness present   Neurological: She is alert.  Skin: Skin is warm and dry. She is not diaphoretic.       ASSESSMENT/ PLAN:   1. Dysphagia: she is on nectar thick liquids and is dependent upon  peg tube feedings. There are no signs of  aspiration present. Will not make changes will monitor her status.   2.  COPD: no change in her status: does require 02. Will continue duoneb three times daily will monitor her status.   3. Chronic pain in multiple joints: her pain is presently managed; will continue mobic 15 mg daily; oxycodone 5 mg every 8 hours routinely and every 4 hours as needed; will  Continue lidoderm patch 1/2 patch to each shoulder and knee daily; will continue cymbalta 60 mg daily and will monitor   4. Hypothyroidism; her tsh is 1.53; will continue synthroid 125 mcg daily   5. Diastolic heart failure: will continue lasix 20 mg daily and will monitor   6. Dementia with behavioral disturbance: no significant change in her status; will continue namzareic 28/10 mg daily and will monitor her status.   7. Bipolar affective disorder: will continue seroquel 50 mg daily; cymbalta 60 mg daily; and depakote 500 mg twice daily and will monitor   8. Constipation: will continue miralax daily  9. Gerd: will continue pepcid 20 mg twice daily     [name] NP [facility]  Contact [phone] Monday through Friday 8am- 5pm  After hours call [phone]

## 2015-03-13 ENCOUNTER — Encounter: Payer: Self-pay | Admitting: *Deleted

## 2015-03-17 NOTE — Progress Notes (Signed)
Patient ID: [name], female   DOB: 09/21/1928, 81 y.o.   MRN: [id number]  [facility]     No Known Allergies     Chief Complaint  Patient presents with  . Medical Management of Chronic Issues    HPI:  She is a long term resident of this facility being seen for the management of her chronic illnesses. Overall her status remains without significant change. She is not able to fully participate in the hpi or ros; but states that she is feeling "ok". There are no nursing concerns at this time.   Past Medical History  Diagnosis Date  . COPD (chronic obstructive pulmonary disease)   . Hypertension   . Blind   . Hypothyroid   . Pacemaker   . Renal insufficiency   . Finger fracture   . Dementia   . Bipolar disorder   . Dysphagia     needs thick nectar liquids  . Atrial fibrillation   . Pneumonia   . Anxiety   . Diastolic heart failure     Past Surgical History  Procedure Laterality Date  . Insert / replace / remove pacemaker    . Tracheostomy  02/27/13    decannulated 03/29/13  . Gastrostomy tube placement  03/11/13    VITAL SIGNS BP 128/70 mmHg  Pulse 74  Ht 5\' 5"  (1.651 m)  Wt 151 lb (68.493 kg)  BMI 25.13 kg/m2  SpO2 97%   Outpatient Encounter Prescriptions as of 02/12/2015  Medication Sig  . docusate (COLACE) 50 MG/5ML liquid Take 100 mg by mouth 2 (two) times daily.  . DULoxetine (CYMBALTA) 60 MG capsule Take 60 mg by mouth daily.  . famotidine (PEPCID) 20 MG tablet Take 1 tablet (20 mg total) by mouth 2 (two) times daily.  . furosemide (LASIX) 20 MG tablet Take 20 mg by mouth daily.  Marland Kitchen. ipratropium-albuterol (DUONEB) 0.5-2.5 (3) MG/3ML SOLN : Take 3 mLs by nebulization 3 (three) times daily.   Marland Kitchen. levothyroxine (SYNTHROID, LEVOTHROID) 125 MCG tablet Take 125 mcg by mouth daily before breakfast.  . lidocaine (LIDODERM) 5 % Place 1 patch onto the skin daily. Apply 1/2 patch to each shoulder and to each knee for 12 hours, then remove  . meloxicam (MOBIC) 15 MG  tablet Take 15 mg by mouth daily.  . Memantine HCl-Donepezil HCl (NAMZARIC) 28-10 MG CP24 28 capsules by PEG Tube route daily.  . Multiple Vitamins-Minerals (CENTAMIN) LIQD Take 5 mLs by mouth daily.  . Nutritional Supplements (FEEDING SUPPLEMENT, OSMOLITE 1.5 CAL,) LIQD Place 60 mLs into feeding tube continuous.  . polyethylene glycol (MIRALAX / GLYCOLAX) packet Take 17 g by mouth daily.  Marland Kitchen. Propylene Glycol (SYSTANE BALANCE) 0.6 % SOLN Apply 1 drop to eye 2 (two) times daily.  . QUEtiapine (SEROQUEL) 50 MG tablet Take 50 mg by mouth at bedtime.  . Valproic Acid 500 MG CPDR Take 500 mg by mouth 2 (two) times daily.      SIGNIFICANT DIAGNOSTIC EXAMS    LABS REVIEWED:   10-18-14: wbc 7.9; hgb 12.1; hct 39.7 ;mcv 102.6;plt 201; glucose 88; bun 29.9; creat 0.51; k+4.3; na++140; liver normal albumin 3.1; tsh 1.53; pre-albumin 19; depakote 31 10-27-14: hgb a1c 5.4      ROS Unable to perform ROS     Physical Exam Constitutional: No distress.  Frail  Neck: Neck supple. No JVD present. No thyromegaly present.  Cardiovascular: Normal rate, regular rhythm and intact distal pulses.   Respiratory: Effort normal and breath sounds normal. No  respiratory distress.  GI: Soft. Bowel sounds are normal. She exhibits no distension. There is no tenderness.  Has peg tube  Musculoskeletal: She exhibits no edema.  Is able to move extremities; has generalized weakness present   Neurological: She is alert.  Skin: Skin is warm and dry. She is not diaphoretic.       ASSESSMENT/ PLAN:   1. Dysphagia: she is on nectar thick liquids and is dependent upon  peg tube feedings. There are no signs of aspiration present. Will not make changes will monitor her status.   2.  COPD: no change in her status: does require 02. Will continue duoneb three times daily will monitor her status.   3. Chronic pain in multiple joints: her pain is presently managed; will continue mobic 15 mg daily; oxycodone 5 mg  every 8 hours routinely and every 4 hours as needed; will  Continue lidoderm patch 1/2 patch to each shoulder and knee daily; will continue cymbalta 60 mg daily and will monitor   4. Hypothyroidism; her tsh is 1.53; will continue synthroid 125 mcg daily   5. Diastolic heart failure: will continue lasix 20 mg daily and will monitor   6. Dementia with behavioral disturbance: no significant change in her status; will continue namzareic 28/10 mg daily and will monitor her status.   7. Bipolar affective disorder: will continue seroquel 50 mg daily; cymbalta 60 mg daily; and depakote 500 mg twice daily and will monitor   8. Constipation: will continue miralax daily  9. Gerd: will continue pepcid 20 mg twice daily    Will check tsh and hgb a1c   Synthia Innocent NP [facility]  Contact [phone] Monday through Friday 8am- 5pm  After hours call [phone]

## 2015-03-23 ENCOUNTER — Other Ambulatory Visit: Payer: Self-pay | Admitting: *Deleted

## 2015-03-23 MED ORDER — OXYCODONE HCL 5 MG PO TABS: ORAL_TABLET | ORAL | Status: DC

## 2015-03-23 NOTE — Telephone Encounter (Signed)
[facility] 

## 2015-03-28 IMAGING — CR DG CHEST 1V PORT
1 series · 1 of 1 positions shown · non-contrast
Comparison: Portable exam 7491 hr compared to 06/12/2014

CLINICAL DATA: Pneumonia, history COPD, hypertension, atrial
fibrillation, diastolic heart failure

EXAM:
PORTABLE CHEST - 1 VIEW

[AP]
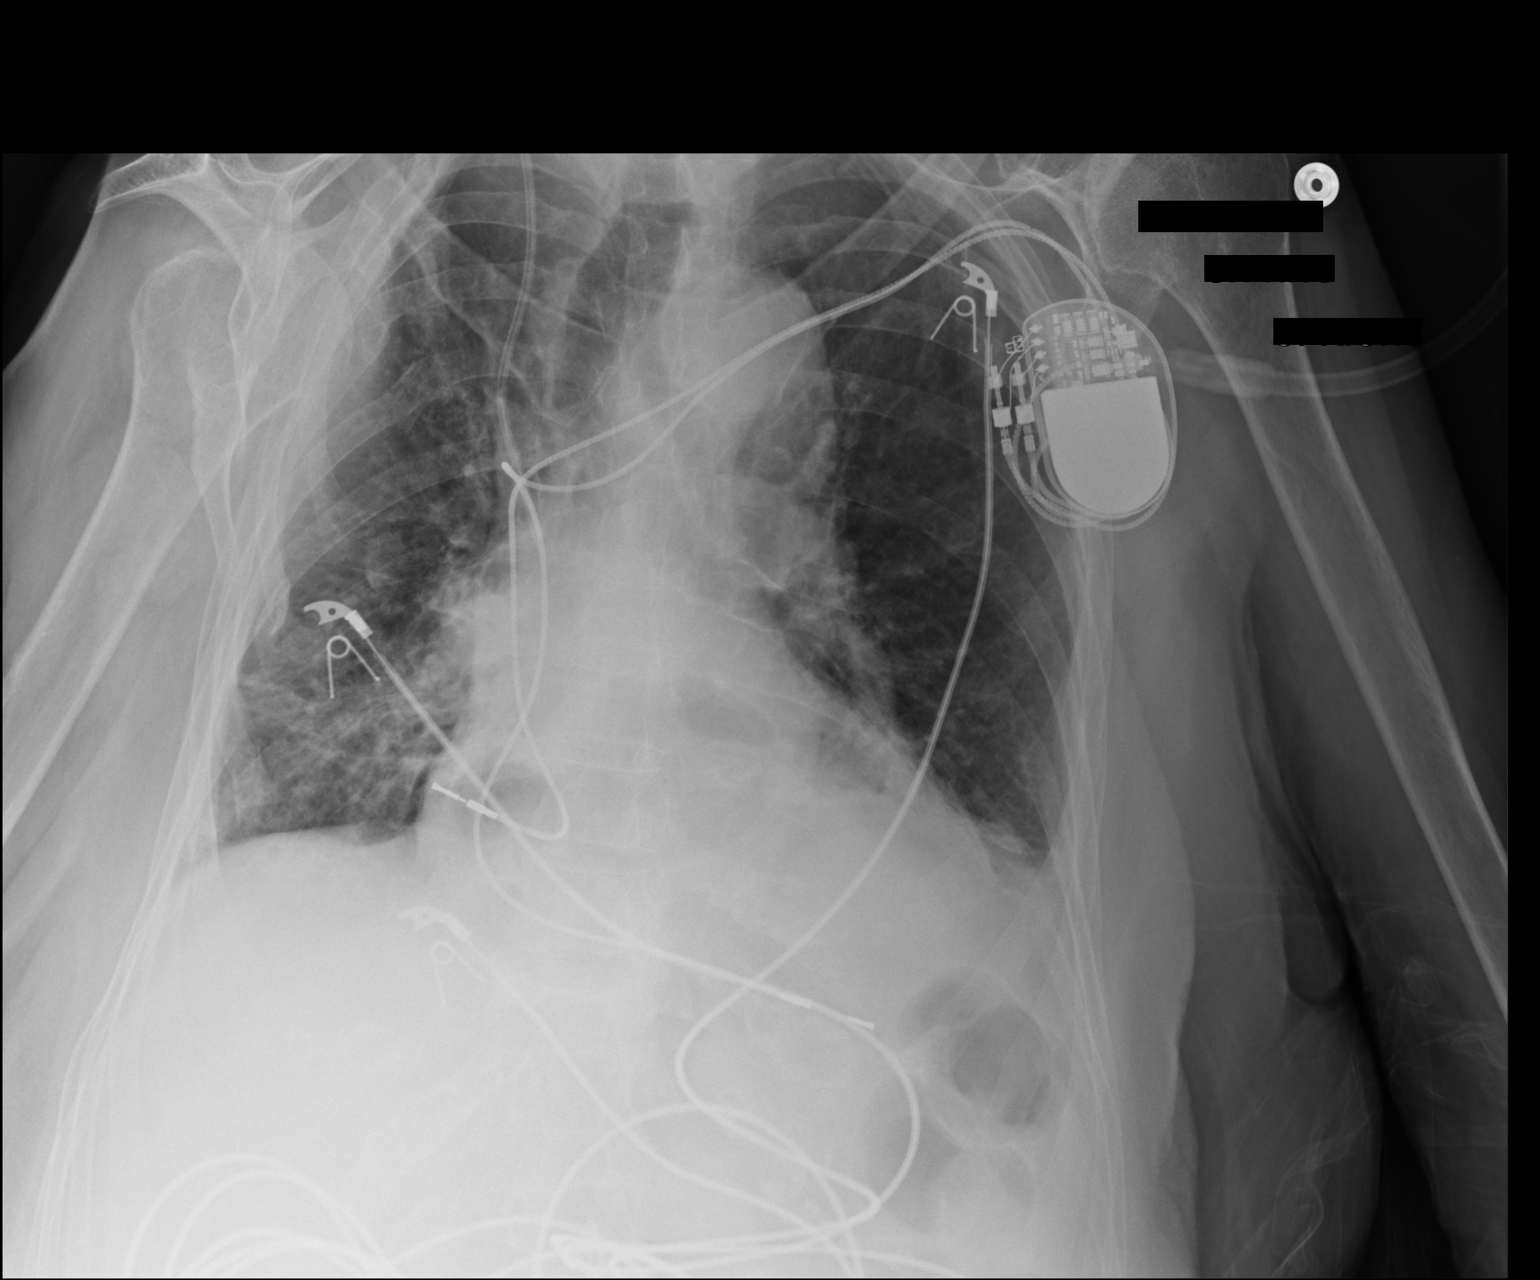

[1 of 1 positions shown; findings below may reference images not displayed]

FINDINGS: LEFT subclavian transvenous pacemaker leads stable projecting over
RIGHT atrium and RIGHT ventricle.

RIGHT jugular central venous catheter tip projecting over mid SVC.

Enlargement of cardiac silhouette with tortuous thoracic aorta.

Large hiatal hernia.

Bibasilar atelectasis with improved interstitial infiltrate question
edema.

No new consolidation, pleural effusion or pneumothorax.

Multiple old RIGHT rib fractures and osseous demineralization again
noted.
IMPRESSION: Improved interstitial infiltrate with persistent bibasilar
atelectasis.

Enlargement of cardiac silhouette post pacemaker.

Large hiatal hernia.

## 2015-04-06 ENCOUNTER — Encounter: Payer: Self-pay | Admitting: Internal Medicine

## 2015-04-06 ENCOUNTER — Non-Acute Institutional Stay (SKILLED_NURSING_FACILITY): Payer: Medicare Other | Admitting: Internal Medicine

## 2015-04-06 DIAGNOSIS — F3175 Bipolar disorder, in partial remission, most recent episode depressed: Secondary | ICD-10-CM | POA: Diagnosis not present

## 2015-04-06 DIAGNOSIS — I10 Essential (primary) hypertension: Secondary | ICD-10-CM | POA: Diagnosis not present

## 2015-04-06 DIAGNOSIS — G8929 Other chronic pain: Secondary | ICD-10-CM | POA: Diagnosis not present

## 2015-04-06 DIAGNOSIS — F0391 Unspecified dementia with behavioral disturbance: Secondary | ICD-10-CM

## 2015-04-06 DIAGNOSIS — E038 Other specified hypothyroidism: Secondary | ICD-10-CM | POA: Diagnosis not present

## 2015-04-06 DIAGNOSIS — E034 Atrophy of thyroid (acquired): Secondary | ICD-10-CM | POA: Diagnosis not present

## 2015-04-06 DIAGNOSIS — K5900 Constipation, unspecified: Secondary | ICD-10-CM | POA: Diagnosis not present

## 2015-04-06 DIAGNOSIS — I5032 Chronic diastolic (congestive) heart failure: Secondary | ICD-10-CM

## 2015-04-06 DIAGNOSIS — J439 Emphysema, unspecified: Secondary | ICD-10-CM

## 2015-04-06 DIAGNOSIS — Z931 Gastrostomy status: Secondary | ICD-10-CM

## 2015-04-06 DIAGNOSIS — F03918 Unspecified dementia, unspecified severity, with other behavioral disturbance: Secondary | ICD-10-CM

## 2015-04-06 NOTE — Progress Notes (Signed)
Patient ID: [name], female   DOB: [birth year], 81 y.o.   MRN: [id number]    DATE: 04/06/15  Location:  [facility]    Place of Service: SNF (31)   Extended Emergency Contact Information Primary Emergency Contact: Robillard,[name] Address: [address]           [place], [place] [address] [place] Home Phone: [phone] Relation: Son Secondary Emergency Contact: Mondragon,Hope Address: [address]          [place], [place] [address] [place] Home Phone: [phone] Relation: Daughter  Advanced Directive information  FULL CODE; MOST FORM ON CHART  Chief Complaint  Patient presents with  . Medical Management of Chronic Issues    HPI:  81 yo female long term resident seen today for f/u. She has no c/o. She receives TF. No nursing issues. No recent falls. She is a poor historian due to dementia. Hx obtained from chart  COPD - stable on duonebs  Thyroid - stable on levothyroxine  HTN/CHF - stable on lasix.   DM - diet controlled. Last A1c 5.3%. CBG 146.  Dysphagia - she receives TF via PEG, osmolite 1.5. She takes decubivite and pepcid  Bipolar - stable on cymbalta, seroquel and depakote  Dementia - stable on namzeric  Chronic pain - stable on oxycodone, lidoderm patch and meloxicam. Takes colace and miralax for constipation.  She is legally blind. afib rate controlled with pacer  Past Medical History  Diagnosis Date  . COPD (chronic obstructive pulmonary disease)   . Hypertension   . Blind   . Hypothyroid   . Pacemaker   . Renal insufficiency   . Finger fracture   . Dementia   . Bipolar disorder   . Dysphagia     needs thick nectar liquids  . Atrial fibrillation   . Pneumonia   . Anxiety   . Diastolic heart failure     Past Surgical History  Procedure Laterality Date  . Insert / replace / remove pacemaker    . Tracheostomy  02/27/13    decannulated 03/29/13  . Gastrostomy tube placement  03/11/13     Patient Care Team: Kirt Boys, DO as PCP - General (Internal Medicine)  History   Social History  . Marital Status: Widowed    Spouse Name: N/A  . Number of Children: N/A  . Years of Education: N/A   Occupational History  . Not on file.   Social History Main Topics  . Smoking status: Former Smoker -- .5 years    Types: Cigarettes  . Smokeless tobacco: Not on file  . Alcohol Use: Not on file  . Drug Use: No  . Sexual Activity: No   Other Topics Concern  . Not on file   Social History Narrative   Pt lives at [facility]     reports that she has quit smoking. Her smoking use included Cigarettes. She quit after .5 years of use. She does not have any smokeless tobacco history on file. She reports that she does not use illicit drugs. Her alcohol history is not on file.  Immunization History  Administered Date(s) Administered  . Influenza-Unspecified 09/24/2012, 08/27/2014    No Known Allergies  Medications: Patient's Medications  New Prescriptions   No medications on file  Previous Medications   DOCUSATE (COLACE) 50 MG/5ML LIQUID    Take 100 mg by mouth 2 (two) times daily.   DULOXETINE (CYMBALTA) 60 MG CAPSULE  Take 60 mg by mouth daily.   FAMOTIDINE (PEPCID) 20 MG TABLET    Take 1 tablet (20 mg total) by mouth 2 (two) times daily.   FOOD THICKENER (THICK IT) POWD    Take 1 Container by mouth as needed. Nectar thick   FUROSEMIDE (LASIX) 20 MG TABLET    Take 20 mg by mouth daily.   IPRATROPIUM-ALBUTEROL (DUONEB) 0.5-2.5 (3) MG/3ML SOLN    Take 3 mLs by nebulization every 4 (four) hours as needed.   LEVOTHYROXINE (SYNTHROID, LEVOTHROID) 125 MCG TABLET    Take 125 mcg by mouth daily before breakfast.   LIDOCAINE (LIDODERM) 5 %    Place 1 patch onto the skin daily. Apply 1/2 patch to each shoulder and to each knee for 12 hours, then remove   MELOXICAM (MOBIC) 15 MG TABLET    Take 15 mg by mouth daily.   MEMANTINE HCL-DONEPEZIL HCL (NAMZARIC) 28-10  MG CP24    28 capsules by PEG Tube route daily.   MULTIPLE VITAMINS-MINERALS (CENTAMIN) LIQD    Take 5 mLs by mouth daily.   NUTRITIONAL SUPPLEMENTS (FEEDING SUPPLEMENT, OSMOLITE 1.5 CAL,) LIQD    Place 60 mLs into feeding tube continuous.   OXYCODONE (OXY IR/ROXICODONE) 5 MG IMMEDIATE RELEASE TABLET    Take one tablet by mouth every 6 hours for pain; Take one tablet by mouth every 4 hours as needed for pain   POLYETHYLENE GLYCOL (MIRALAX / GLYCOLAX) PACKET    Take 17 g by mouth daily.   PROPYLENE GLYCOL (SYSTANE BALANCE) 0.6 % SOLN    Apply 1 drop to eye 2 (two) times daily.   QUETIAPINE (SEROQUEL) 50 MG TABLET    Take 50 mg by mouth at bedtime.   VALPROIC ACID 500 MG CPDR    Take 500 mg by mouth 2 (two) times daily.  Modified Medications   No medications on file  Discontinued Medications   No medications on file    Review of Systems  Unable to perform ROS: Dementia    Filed Vitals:   04/06/15 1720  BP: 120/86  Pulse: 78  Temp: 97 F (36.1 C)  Weight: 155 lb (70.308 kg)  SpO2: 97%   Body mass index is 25.79 kg/(m^2).  Physical Exam  Constitutional: She appears well-developed. She is sleeping. No distress.  Lying in bed asleep in NAD. Frail appearing  HENT:  Mouth/Throat: Oropharynx is clear and moist. No oropharyngeal exudate.  Eyes: No scleral icterus.  Legally blind.   Neck: Neck supple. Carotid bruit is not present. No tracheal deviation present. No thyromegaly present.  Cardiovascular: Normal rate, regular rhythm and intact distal pulses.  Exam reveals no gallop and no friction rub.   Murmur (1/6 SEM) heard. No LE edema b/l. no calf TTP.   Pulmonary/Chest: Effort normal. No stridor. No respiratory distress. She has no wheezes. She has rales (inspiratory).  ACW palpable pacemaker  Abdominal: Soft. Bowel sounds are normal. She exhibits no distension and no mass. There is no hepatomegaly. There is no tenderness. There is no rebound and no guarding.  Peg tube intact. No  redness or d/c at insertion site  Musculoskeletal: She exhibits edema.  Lymphadenopathy:    She has no cervical adenopathy.  Skin: Skin is warm and dry. No rash noted.  Psychiatric: She has a normal mood and affect. Her behavior is normal.     Labs reviewed:  Lab Results  Component Value Date   HGBA1C 5.3 02/13/2015   TSH 1.23  No results  found.   Assessment/Plan   ICD-9-CM ICD-10-CM   1. Dementia with behavioral disturbance - stable 294.21 F03.91   2. Chronic pain - stable 338.29 G89.29   3. Pulmonary emphysema, unspecified emphysema type (HCC) - stable 492.8 J43.9   4. Essential hypertension, benign - stable 401.1 I10   5. Chronic diastolic heart failure (HCC) - stable 428.32 I50.32   6. Bipolar disorder, in partial remission, most recent episode depressed (HCC) - stable 296.55 F31.75   7. PEG (percutaneous endoscopic gastrostomy) status (HCC) - due to dysphagia V44.1 Z93.1   8. Hypothyroidism due to acquired atrophy of thyroid - stable 244.8 E03.8    246.8 E03.4   9. Constipation, unspecified constipation type - stable 564.00 K59.00     --check CMP  --Pt is medically stable on current tx plan. Continue current medications as ordered. PT/OT/ST as indicated. Will follow  Razan Siler S. Ancil Linseyarter, D. O., F. A. C. O. I.  [facility] [address] [place], [place] [address] [phone] Cell (Monday-Friday 8 AM - 5 PM) [phone] After 5 PM and follow prompts

## 2015-05-14 ENCOUNTER — Encounter: Payer: Self-pay | Admitting: Adult Health

## 2015-05-14 ENCOUNTER — Non-Acute Institutional Stay (SKILLED_NURSING_FACILITY): Payer: Medicare Other | Admitting: Adult Health

## 2015-05-14 DIAGNOSIS — J449 Chronic obstructive pulmonary disease, unspecified: Secondary | ICD-10-CM

## 2015-05-14 DIAGNOSIS — K5900 Constipation, unspecified: Secondary | ICD-10-CM

## 2015-05-14 DIAGNOSIS — I5032 Chronic diastolic (congestive) heart failure: Secondary | ICD-10-CM | POA: Diagnosis not present

## 2015-05-14 DIAGNOSIS — F3175 Bipolar disorder, in partial remission, most recent episode depressed: Secondary | ICD-10-CM

## 2015-05-14 DIAGNOSIS — F03918 Unspecified dementia, unspecified severity, with other behavioral disturbance: Secondary | ICD-10-CM

## 2015-05-14 DIAGNOSIS — E034 Atrophy of thyroid (acquired): Secondary | ICD-10-CM

## 2015-05-14 DIAGNOSIS — E038 Other specified hypothyroidism: Secondary | ICD-10-CM

## 2015-05-14 DIAGNOSIS — R1310 Dysphagia, unspecified: Secondary | ICD-10-CM

## 2015-05-14 DIAGNOSIS — F0391 Unspecified dementia with behavioral disturbance: Secondary | ICD-10-CM | POA: Diagnosis not present

## 2015-05-14 DIAGNOSIS — G8929 Other chronic pain: Secondary | ICD-10-CM

## 2015-05-14 NOTE — Progress Notes (Signed)
Patient ID: [name], female   DOB: July 26, 1928, 81 y.o.   MRN: [id number]  [facility]     No Known Allergies     Chief Complaint  Patient presents with  . Medical Management of Chronic Issues    HPI:  She is a long term resident of this facility being seen for the management of her chronic illnesses. Overall her status is without change. She is not voicing any pain. She cannot fully participate in the hpi or ros. There are no nursing concerns at this time.    Past Medical History  Diagnosis Date  . COPD (chronic obstructive pulmonary disease)   . Hypertension   . Blind   . Hypothyroid   . Pacemaker   . Renal insufficiency   . Finger fracture   . Dementia   . Bipolar disorder   . Dysphagia     needs thick nectar liquids  . Atrial fibrillation   . Pneumonia   . Anxiety   . Diastolic heart failure     Past Surgical History  Procedure Laterality Date  . Insert / replace / remove pacemaker    . Tracheostomy  02/27/13    decannulated 03/29/13  . Gastrostomy tube placement  03/11/13    VITAL SIGNS BP 112/70 mmHg  Pulse 77  Ht  (1.651 m)  Wt 155 lb (70.308 kg)  BMI 25.79 kg/m2  SpO2 96%   Outpatient Encounter Prescriptions as of 05/14/2015  Medication Sig  . docusate (COLACE) 50 MG/5ML liquid Take 100 mg by mouth 2 (two) times daily.  . DULoxetine (CYMBALTA) 60 MG capsule Take 60 mg by mouth daily.  . famotidine (PEPCID) 20 MG tablet Take 1 tablet (20 mg total) by mouth 2 (two) times daily.  . food thickener (THICK IT) POWD Take 1 Container by mouth as needed. Nectar thick  . furosemide (LASIX) 20 MG tablet Take 20 mg by mouth daily.  Marland Kitchen ipratropium-albuterol (DUONEB) 0.5-2.5 (3) MG/3ML SOLN  Take 3 mLs by nebulization 3 (three) times daily. )  . levothyroxine (SYNTHROID, LEVOTHROID) 125 MCG tablet Take 125 mcg by mouth daily before breakfast.  . lidocaine (LIDODERM) 5 % Place 1 patch onto the skin daily. Apply 1/2 patch to each shoulder and to each knee  for 12 hours, then remove  . meloxicam (MOBIC) 15 MG tablet Take 15 mg by mouth daily.  . Memantine HCl-Donepezil HCl (NAMZARIC) 28-10 MG CP24 28 capsules by PEG Tube route daily.  . Multiple Vitamins-Minerals (CENTAMIN) LIQD Take 5 mLs by mouth daily.  . Nutritional Supplements (FEEDING SUPPLEMENT, OSMOLITE 1.5 CAL,) LIQD Place 60 mLs into feeding tube continuous.  Marland Kitchen oxyCODONE (OXY IR/ROXICODONE) 5 MG immediate release tablet Take one tablet by mouth every 6 hours for pain; Take one tablet by mouth every 4 hours as needed for pain  . polyethylene glycol (MIRALAX / GLYCOLAX) packet Take 17 g by mouth daily.  Marland Kitchen Propylene Glycol (SYSTANE BALANCE) 0.6 % SOLN Apply 1 drop to eye 2 (two) times daily.  . QUEtiapine (SEROQUEL) 50 MG tablet Take 50 mg by mouth at bedtime.  . Valproic Acid 500 MG CPDR Take 500 mg by mouth 2 (two) times daily.      SIGNIFICANT DIAGNOSTIC EXAMS   LABS REVIEWED:   10-18-14: wbc 7.9; hgb 12.1; hct 39.7 ;mcv 102.6;plt 201; glucose 88; bun 29.9; creat 0.51; k+4.3; na++140; liver normal albumin 3.1; tsh 1.53; pre-albumin 19; depakote 31 10-27-14: hgb a1c 5.4  02-13-15: tsh 1.23; hgb a1c 5.3  04-08-15: glucose 120; bun 38.9; creat 0.76; k+4.6; na++ 147; liver normal albumin 3.3      Review of Systems  Unable to perform ROS    Physical Exam Constitutional: No distress.  Frail  Neck: Neck supple. No JVD present. No thyromegaly present.  Cardiovascular: Normal rate, regular rhythm and intact distal pulses.   Respiratory: Effort normal and breath sounds normal. No respiratory distress.  GI: Soft. Bowel sounds are normal. She exhibits no distension. There is no tenderness.  Has peg tube  Musculoskeletal: She exhibits no edema.  Is able to move extremities; has generalized weakness present   Neurological: She is alert.  Skin: Skin is warm and dry. She is not diaphoretic. Old trach site without signs of infection present.         ASSESSMENT/ PLAN:  1.  Dysphagia: she is on nectar thick liquids and is dependent upon  peg tube feedings. There are no signs of aspiration present. Will not make changes will monitor her status.   2.  COPD: no change in her status: does require 02. Will continue duoneb three times daily will monitor her status.   3. Chronic pain in multiple joints: her pain is presently managed; will continue mobic 15 mg daily; oxycodone 5 mg every 6 hours routinely and every 4 hours as needed; will  Continue lidoderm patch 1/2 patch to each shoulder and knee daily; will continue cymbalta 60 mg daily and will monitor   4. Hypothyroidism; her tsh is 1.23; will continue synthroid 125 mcg daily   5. Diastolic heart failure: will continue lasix 20 mg daily and will monitor does have a pacemaker   6. Dementia with behavioral disturbance: no significant change in her status; will continue namzareic 28/10 mg daily and will monitor her status.   7. Bipolar affective disorder: will continue seroquel 50 mg daily; cymbalta 60 mg daily; and depakote 500 mg twice daily and will monitor   8. Constipation: will continue miralax daily and colace twice daily   9. Gerd: will continue pepcid 20 mg twice daily      Synthia Innocenteborah Zarya Lasseigne NP [facility]  Contact [phone] Monday through Friday 8am- 5pm  After hours call [phone]

## 2015-05-19 ENCOUNTER — Other Ambulatory Visit: Payer: Self-pay

## 2015-05-19 MED ORDER — OXYCODONE HCL 5 MG PO TABS: ORAL_TABLET | ORAL | Status: DC

## 2015-05-19 NOTE — Telephone Encounter (Signed)
RX faxed to [facility] @ [phone], phone number [phone] 

## 2015-06-09 ENCOUNTER — Non-Acute Institutional Stay (SKILLED_NURSING_FACILITY): Payer: Medicare Other | Admitting: Adult Health

## 2015-06-09 DIAGNOSIS — I10 Essential (primary) hypertension: Secondary | ICD-10-CM | POA: Diagnosis not present

## 2015-06-09 DIAGNOSIS — F03918 Unspecified dementia, unspecified severity, with other behavioral disturbance: Secondary | ICD-10-CM

## 2015-06-09 DIAGNOSIS — I5032 Chronic diastolic (congestive) heart failure: Secondary | ICD-10-CM

## 2015-06-09 DIAGNOSIS — F3175 Bipolar disorder, in partial remission, most recent episode depressed: Secondary | ICD-10-CM | POA: Diagnosis not present

## 2015-06-09 DIAGNOSIS — F0391 Unspecified dementia with behavioral disturbance: Secondary | ICD-10-CM

## 2015-06-09 DIAGNOSIS — G8929 Other chronic pain: Secondary | ICD-10-CM

## 2015-06-09 DIAGNOSIS — J449 Chronic obstructive pulmonary disease, unspecified: Secondary | ICD-10-CM | POA: Diagnosis not present

## 2015-06-09 DIAGNOSIS — E038 Other specified hypothyroidism: Secondary | ICD-10-CM

## 2015-06-09 DIAGNOSIS — E034 Atrophy of thyroid (acquired): Secondary | ICD-10-CM

## 2015-06-09 DIAGNOSIS — K5900 Constipation, unspecified: Secondary | ICD-10-CM | POA: Diagnosis not present

## 2015-06-09 DIAGNOSIS — R1310 Dysphagia, unspecified: Secondary | ICD-10-CM

## 2015-07-13 ENCOUNTER — Other Ambulatory Visit: Payer: Self-pay | Admitting: *Deleted

## 2015-07-13 MED ORDER — OXYCODONE HCL 5 MG PO TABS: ORAL_TABLET | ORAL | Status: DC

## 2015-07-13 NOTE — Telephone Encounter (Signed)
[facility] 

## 2015-07-16 ENCOUNTER — Non-Acute Institutional Stay (SKILLED_NURSING_FACILITY): Payer: Medicare Other | Admitting: Adult Health

## 2015-07-16 DIAGNOSIS — K5900 Constipation, unspecified: Secondary | ICD-10-CM

## 2015-07-16 DIAGNOSIS — I5032 Chronic diastolic (congestive) heart failure: Secondary | ICD-10-CM

## 2015-07-16 DIAGNOSIS — E034 Atrophy of thyroid (acquired): Secondary | ICD-10-CM | POA: Diagnosis not present

## 2015-07-16 DIAGNOSIS — F3175 Bipolar disorder, in partial remission, most recent episode depressed: Secondary | ICD-10-CM | POA: Diagnosis not present

## 2015-07-16 DIAGNOSIS — Z931 Gastrostomy status: Secondary | ICD-10-CM

## 2015-07-16 DIAGNOSIS — J449 Chronic obstructive pulmonary disease, unspecified: Secondary | ICD-10-CM

## 2015-07-16 DIAGNOSIS — Z950 Presence of cardiac pacemaker: Secondary | ICD-10-CM | POA: Diagnosis not present

## 2015-07-16 DIAGNOSIS — R1310 Dysphagia, unspecified: Secondary | ICD-10-CM

## 2015-07-16 DIAGNOSIS — F03918 Unspecified dementia, unspecified severity, with other behavioral disturbance: Secondary | ICD-10-CM

## 2015-07-16 DIAGNOSIS — F0391 Unspecified dementia with behavioral disturbance: Secondary | ICD-10-CM | POA: Diagnosis not present

## 2015-07-16 DIAGNOSIS — I10 Essential (primary) hypertension: Secondary | ICD-10-CM | POA: Diagnosis not present

## 2015-07-16 DIAGNOSIS — G8929 Other chronic pain: Secondary | ICD-10-CM | POA: Diagnosis not present

## 2015-07-16 DIAGNOSIS — E038 Other specified hypothyroidism: Secondary | ICD-10-CM | POA: Diagnosis not present

## 2015-08-06 ENCOUNTER — Encounter: Payer: Self-pay | Admitting: Adult Health

## 2015-08-06 NOTE — Progress Notes (Signed)
Patient ID: [name], female   DOB: 07/14/1928, 81 y.o.   MRN: [id number]    Facility: [facility]      No Known Allergies  Chief Complaint  Patient presents with  . Medical Management of Chronic Issues    HPI:  She is a long term resident of this facility being seen for the management of her chronic illnesses. She remains stable overall. She is unable to fully participate in the hpi or ros; but states that she is feeling good. There are no nursing concerns at this time.    Past Medical History  Diagnosis Date  . COPD (chronic obstructive pulmonary disease)   . Hypertension   . Blind   . Hypothyroid   . Pacemaker   . Renal insufficiency   . Finger fracture   . Dementia   . Bipolar disorder   . Dysphagia     needs thick nectar liquids  . Atrial fibrillation   . Pneumonia   . Anxiety   . Diastolic heart failure     Past Surgical History  Procedure Laterality Date  . Insert / replace / remove pacemaker    . Tracheostomy  02/27/13    decannulated 03/29/13  . Gastrostomy tube placement  03/11/13    VITAL SIGNS BP 118/82 mmHg  Pulse 71  Ht  (1.651 m)  Wt 153 lb (69.4 kg)  BMI 25.46 kg/m2  SpO2 96%  Patient's Medications  New Prescriptions   No medications on file  Previous Medications   DOCUSATE (COLACE) 50 MG/5ML LIQUID    Take 100 mg by mouth 2 (two) times daily.   DULOXETINE (CYMBALTA) 60 MG CAPSULE    Take 60 mg by mouth daily.   FAMOTIDINE (PEPCID) 20 MG TABLET    Take 1 tablet (20 mg total) by mouth 2 (two) times daily.   FOOD THICKENER (THICK IT) POWD    Take 1 Container by mouth as needed. Nectar thick   FUROSEMIDE (LASIX) 20 MG TABLET    Take 20 mg by mouth daily.   IPRATROPIUM-ALBUTEROL (DUONEB) 0.5-2.5 (3) MG/3ML SOLN    Take 3 mLs by nebulization every 4 (four) hours as needed.   LEVOTHYROXINE (SYNTHROID, LEVOTHROID) 125 MCG TABLET    Take 125 mcg by mouth daily before breakfast.   LIDOCAINE (LIDODERM) 5 %    Place 1 patch onto  the skin daily. Apply 1/2 patch to each shoulder and to each knee for 12 hours, then remove   MELOXICAM (MOBIC) 15 MG TABLET    Take 15 mg by mouth daily.   MEMANTINE HCL-DONEPEZIL HCL (NAMZARIC) 28-10 MG CP24    28 capsules by PEG Tube route daily.   MULTIPLE VITAMINS-MINERALS (CENTAMIN) LIQD    Take 5 mLs by mouth daily.   NUTRITIONAL SUPPLEMENTS (FEEDING SUPPLEMENT, OSMOLITE 1.5 CAL,) LIQD    Place 60 mLs into feeding tube continuous.   OXYCODONE (OXY IR/ROXICODONE) 5 MG IMMEDIATE RELEASE TABLET    Take one tablet by mouth every 6 hours for pain; Take one tablet by mouth every 4 hours as needed for pain   POLYETHYLENE GLYCOL (MIRALAX / GLYCOLAX) PACKET    Take 17 g by mouth daily.   PROPYLENE GLYCOL (SYSTANE BALANCE) 0.6 % SOLN    Apply 1 drop to eye 2 (two) times daily.   QUETIAPINE (SEROQUEL) 50 MG TABLET    Take 50 mg by mouth at bedtime.   VALPROIC ACID 500 MG CPDR    Take 500 mg by  mouth 2 (two) times daily.  Modified Medications   No medications on file  Discontinued Medications   No medications on file     SIGNIFICANT DIAGNOSTIC EXAMS   LABS REVIEWED:   10-18-14: wbc 7.9; hgb 12.1; hct 39.7 ;mcv 102.6;plt 201; glucose 88; bun 29.9; creat 0.51; k+4.3; na++140; liver normal albumin 3.1; tsh 1.53; pre-albumin 19; depakote 31 10-27-14: hgb a1c 5.4  02-13-15: tsh 1.23; hgb a1c 5.3  04-08-15: glucose 120; bun 38.9; creat 0.76; k+4.6; na++ 147; liver normal albumin 3.3     Review of Systems  Unable to perform ROS: Dementia      Physical Exam Constitutional: No distress.  Frail  Neck: Neck supple. No JVD present. No thyromegaly present.  Cardiovascular: Normal rate, regular rhythm and intact distal pulses.   Respiratory: Effort normal and breath sounds normal. No respiratory distress.  GI: Soft. Bowel sounds are normal. She exhibits no distension. There is no tenderness.  Has peg tube  Musculoskeletal: She exhibits no edema.  Is able to move extremities; has generalized  weakness present   Neurological: She is alert.  Skin: Skin is warm and dry. She is not diaphoretic. Old trach site without signs of infection present.       ASSESSMENT/ PLAN:  1. Dysphagia: she is on nectar thick liquids and is dependent upon  peg tube feedings. There are no signs of aspiration present. Will not make changes will monitor her status.   2.  COPD: no change in her status: does require 02. Will continue duoneb three times daily will monitor her status.   3. Chronic pain in multiple joints: her pain is presently managed; will continue mobic 15 mg daily; oxycodone 5 mg every 6 hours routinely and every 4 hours as needed; will  Continue lidoderm patch 1/2 patch to each shoulder and knee daily; will continue cymbalta 60 mg daily and will monitor   4. Hypothyroidism; her tsh is 1.23; will continue synthroid 125 mcg daily   5. Diastolic heart failure: will continue lasix 20 mg daily and will monitor does have a pacemaker   6. Dementia with behavioral disturbance: no significant change in her status; will continue namzareic 28/10 mg daily and will monitor her status.   7. Bipolar affective disorder: will continue seroquel 50 mg daily; cymbalta 60 mg daily; and depakote 500 mg twice daily and will monitor   8. Constipation: will continue miralax daily and colace twice daily   9. Gerd: will continue pepcid 20 mg twice daily    Will check cbc; depakote level       Synthia Innocent NP [facility]  Contact [phone] Monday through Friday 8am- 5pm  After hours call [phone]

## 2015-08-06 NOTE — Progress Notes (Signed)
Patient ID: Crystalynn Mcinerney, female   DOB: 1927-12-04, 81 y.o.   MRN: [id number]    Facility: [facility]      No Known Allergies  Chief Complaint  Patient presents with  . Annual Exam    HPI:  She is a long term resident of this facility being seen for the management of her chronic illnesses. Overall there has been little change in her status over the past several months. She has not been hospitalized. She is unable to fully participate in the hpi or ros. There are no nursing concerns at this time.    Past Medical History  Diagnosis Date  . COPD (chronic obstructive pulmonary disease)   . Hypertension   . Blind   . Hypothyroid   . Pacemaker   . Renal insufficiency   . Finger fracture   . Dementia   . Bipolar disorder   . Dysphagia     needs thick nectar liquids  . Atrial fibrillation   . Pneumonia   . Anxiety   . Diastolic heart failure     Past Surgical History  Procedure Laterality Date  . Insert / replace / remove pacemaker    . Tracheostomy  02/27/13    decannulated 03/29/13  . Gastrostomy tube placement  03/11/13    Social History   Social History  . Marital Status: Widowed    Spouse Name: N/A  . Number of Children: N/A  . Years of Education: N/A   Occupational History  . Not on file.   Social History Main Topics  . Smoking status: Former Smoker -- .5 years    Types: Cigarettes  . Smokeless tobacco: Not on file  . Alcohol Use: Not on file  . Drug Use: No  . Sexual Activity: No   Other Topics Concern  . Not on file   Social History Narrative   Pt lives at [facility]     VITAL SIGNS BP 146/80 mmHg  Pulse 68  Ht  (1.651 m)  Wt 152 lb (68.947 kg)  BMI 25.29 kg/m2  SpO2 96%  Patient's Medications  New Prescriptions   No medications on file  Previous Medications   DOCUSATE (COLACE) 50 MG/5ML LIQUID    Take 100 mg by mouth 2 (two) times daily.   DULOXETINE (CYMBALTA) 60 MG CAPSULE    Take 60 mg by mouth  daily.   FAMOTIDINE (PEPCID) 20 MG TABLET    Take 1 tablet (20 mg total) by mouth 2 (two) times daily.   FOOD THICKENER (THICK IT) POWD    Take 1 Container by mouth as needed. Nectar thick   FUROSEMIDE (LASIX) 20 MG TABLET    Take 20 mg by mouth daily.   IPRATROPIUM-ALBUTEROL (DUONEB) 0.5-2.5 (3) MG/3ML SOLN    Take 3 mLs by nebulization every 4 (four) hours as needed.   LEVOTHYROXINE (SYNTHROID, LEVOTHROID) 125 MCG TABLET    Take 125 mcg by mouth daily before breakfast.   LIDOCAINE (LIDODERM) 5 %    Place 1 patch onto the skin daily. Apply 1/2 patch to each shoulder and to each knee for 12 hours, then remove   MELOXICAM (MOBIC) 15 MG TABLET    Take 15 mg by mouth daily.   MEMANTINE HCL-DONEPEZIL HCL (NAMZARIC) 28-10 MG CP24    28 capsules by PEG Tube route daily.   MULTIPLE VITAMINS-MINERALS (CENTAMIN) LIQD    Take 5 mLs by mouth daily.   NUTRITIONAL SUPPLEMENTS (FEEDING SUPPLEMENT, OSMOLITE 1.5 CAL,)  LIQD    Place 60 mLs into feeding tube continuous.   OXYCODONE (OXY IR/ROXICODONE) 5 MG IMMEDIATE RELEASE TABLET    Take one tablet by mouth every 6 hours for pain; Take one tablet by mouth every 4 hours as needed for pain   POLYETHYLENE GLYCOL (MIRALAX / GLYCOLAX) PACKET    Take 17 g by mouth daily.   PROPYLENE GLYCOL (SYSTANE BALANCE) 0.6 % SOLN    Apply 1 drop to eye 2 (two) times daily.   QUETIAPINE (SEROQUEL) 50 MG TABLET    Take 50 mg by mouth at bedtime.   VALPROIC ACID 500 MG CPDR    Take 500 mg by mouth 2 (two) times daily.  Modified Medications   No medications on file  Discontinued Medications   No medications on file     SIGNIFICANT DIAGNOSTIC EXAMS  She is not a candidate for dexa scan; mammogram; colonoscopy   LABS REVIEWED:   10-18-14: wbc 7.9; hgb 12.1; hct 39.7 ;mcv 102.6;plt 201; glucose 88; bun 29.9; creat 0.51; k+4.3; na++140; liver normal albumin 3.1; tsh 1.53; pre-albumin 19; depakote 31 10-27-14: hgb a1c 5.4  02-13-15: tsh 1.23; hgb a1c 5.3  04-08-15: glucose 120;  bun 38.9; creat 0.76; k+4.6; na++ 147; liver normal albumin 3.3  06-10-15: wbc 7.8 ;hgb 11.7; hct 39.1; mcv 106.9; plt 170; glucose 120; bun 38.9; creat 0.78; k+ 4.6; na++147; liver normal albumin 3.3; tsh 1.23; hgb a1c 5.3      Review of Systems  Unable to perform ROS: Dementia      Physical Exam Constitutional: No distress.  Frail  Neck: Neck supple. No JVD present. No thyromegaly present.  Cardiovascular: Normal rate, regular rhythm and intact distal pulses.   Respiratory: Effort normal and breath sounds normal. No respiratory distress.  GI: Soft. Bowel sounds are normal. She exhibits no distension. There is no tenderness.  Has peg tube  Musculoskeletal: She exhibits no edema.  Is able to move extremities; has generalized weakness present   Neurological: She is alert.  Skin: Skin is warm and dry. She is not diaphoretic.     ASSESSMENT/ PLAN:  1. Dysphagia: she is on nectar thick liquids and is dependent upon  peg tube feedings. There are no signs of aspiration present. Will not make changes will monitor her status.   2.  COPD: no change in her status: does require 02. Will continue duoneb three times daily will monitor her status.   3. Chronic pain in multiple joints: her pain is presently managed; will continue mobic 15 mg daily; oxycodone 5 mg every 6 hours routinely and every 4 hours as needed; will  Continue lidoderm patch 1/2 patch to each shoulder and knee daily; will continue cymbalta 60 mg daily and will monitor   4. Hypothyroidism; her tsh is 1.23; will continue synthroid 125 mcg daily   5. Diastolic heart failure: will continue lasix 20 mg daily and will monitor does have a pacemaker   6. Dementia with behavioral disturbance: no significant change in her status; will continue namzareic 28/10 mg daily and will monitor her status.   7. Bipolar affective disorder: will continue seroquel 50 mg daily; cymbalta 60 mg daily; and depakote 500 mg twice daily and will  monitor   8. Constipation: will continue miralax daily and colace twice daily   9. Gerd: will continue pepcid 20 mg twice daily       Synthia Innocent NP [facility]  Contact [phone] Monday through Friday 8am- 5pm  After  hours call [phone]

## 2015-08-17 ENCOUNTER — Non-Acute Institutional Stay (SKILLED_NURSING_FACILITY): Payer: Medicare Other | Admitting: Internal Medicine

## 2015-08-17 DIAGNOSIS — E038 Other specified hypothyroidism: Secondary | ICD-10-CM

## 2015-08-17 DIAGNOSIS — F3175 Bipolar disorder, in partial remission, most recent episode depressed: Secondary | ICD-10-CM | POA: Diagnosis not present

## 2015-08-17 DIAGNOSIS — G8929 Other chronic pain: Secondary | ICD-10-CM

## 2015-08-17 DIAGNOSIS — E034 Atrophy of thyroid (acquired): Secondary | ICD-10-CM | POA: Diagnosis not present

## 2015-08-17 DIAGNOSIS — J449 Chronic obstructive pulmonary disease, unspecified: Secondary | ICD-10-CM | POA: Diagnosis not present

## 2015-08-17 DIAGNOSIS — Z950 Presence of cardiac pacemaker: Secondary | ICD-10-CM

## 2015-08-17 DIAGNOSIS — I10 Essential (primary) hypertension: Secondary | ICD-10-CM

## 2015-08-17 DIAGNOSIS — I5032 Chronic diastolic (congestive) heart failure: Secondary | ICD-10-CM

## 2015-08-17 DIAGNOSIS — R627 Adult failure to thrive: Secondary | ICD-10-CM

## 2015-08-17 DIAGNOSIS — R739 Hyperglycemia, unspecified: Secondary | ICD-10-CM

## 2015-08-17 DIAGNOSIS — R1310 Dysphagia, unspecified: Secondary | ICD-10-CM

## 2015-08-17 DIAGNOSIS — F0391 Unspecified dementia with behavioral disturbance: Secondary | ICD-10-CM

## 2015-08-17 DIAGNOSIS — F03918 Unspecified dementia, unspecified severity, with other behavioral disturbance: Secondary | ICD-10-CM

## 2015-08-18 ENCOUNTER — Non-Acute Institutional Stay (SKILLED_NURSING_FACILITY): Payer: Medicare Other | Admitting: Internal Medicine

## 2015-08-18 DIAGNOSIS — J9611 Chronic respiratory failure with hypoxia: Secondary | ICD-10-CM | POA: Diagnosis not present

## 2015-08-18 DIAGNOSIS — G8929 Other chronic pain: Secondary | ICD-10-CM | POA: Diagnosis not present

## 2015-08-18 DIAGNOSIS — F3175 Bipolar disorder, in partial remission, most recent episode depressed: Secondary | ICD-10-CM

## 2015-08-18 DIAGNOSIS — R1310 Dysphagia, unspecified: Secondary | ICD-10-CM | POA: Diagnosis not present

## 2015-08-18 DIAGNOSIS — J449 Chronic obstructive pulmonary disease, unspecified: Secondary | ICD-10-CM

## 2015-08-18 DIAGNOSIS — Z931 Gastrostomy status: Secondary | ICD-10-CM

## 2015-08-18 DIAGNOSIS — J9612 Chronic respiratory failure with hypercapnia: Secondary | ICD-10-CM

## 2015-08-18 DIAGNOSIS — R627 Adult failure to thrive: Secondary | ICD-10-CM

## 2015-08-18 DIAGNOSIS — F0391 Unspecified dementia with behavioral disturbance: Secondary | ICD-10-CM

## 2015-08-18 DIAGNOSIS — G243 Spasmodic torticollis: Secondary | ICD-10-CM

## 2015-08-18 DIAGNOSIS — I5032 Chronic diastolic (congestive) heart failure: Secondary | ICD-10-CM | POA: Diagnosis not present

## 2015-08-18 DIAGNOSIS — F03918 Unspecified dementia, unspecified severity, with other behavioral disturbance: Secondary | ICD-10-CM

## 2015-08-19 ENCOUNTER — Encounter: Payer: Self-pay | Admitting: Internal Medicine

## 2015-08-19 LAB — BASIC METABOLIC PANEL
BUN: 41 mg/dL — AB (ref 4–21)
Creatinine: 0.7 mg/dL (ref 0.5–1.1)
Glucose: 87 mg/dL
SODIUM: 139 mmol/L (ref 137–147)

## 2015-08-19 LAB — HEPATIC FUNCTION PANEL
ALK PHOS: 98 U/L (ref 25–125)
ALT: 7 U/L (ref 7–35)
AST: 11 U/L — AB (ref 13–35)

## 2015-08-19 LAB — TSH: TSH: 2.9 u[IU]/mL (ref 0.41–5.90)

## 2015-08-19 MED ORDER — OXYCODONE HCL 5 MG PO TABS: ORAL_TABLET | ORAL | Status: DC

## 2015-08-19 NOTE — Progress Notes (Signed)
Patient ID: [name], female   DOB: 1928/11/14, 81 y.o.   MRN: [id number]  Location:  [facility] Provider:  Rexene Edison. Mariea Clonts, D.O., C.M.D. Gildardo Cranker, DO  Code Status:  Full code Goals of care: Advanced Directive information Does patient have an advance directive?: Yes, Type of Advance Directive: Out of facility DNR (pink MOST or yellow form), Pre-existing out of facility DNR order (yellow form or pink MOST form): Pink MOST form placed in chart (order not valid for inpatient use) (MOST with do everything on it was completed Nov 2015), Does patient want to make changes to advanced directive?: No - Patient declined  Chief Complaint  Patient presents with  . Medical Management of Chronic Issues    declining condition, increased pain per her son--he requested to meet with the physician    HPI:  Pt is a 81 y.o. white female seen 08/18/15 for medical management of chronic diseases.  Her son requested she be seen due to "decline" and increased pain when he's been visiting her.  She has a h/o COPD, HTN, bipolar disorder, dysphagia on tube feeding via peg, blindness, generalized frailty, dementia likely vascular in nature.  She's had chronic pain with some dependence on narcotic medications.  Historically, higher doses of narcotics have caused oversedation and respiratory failure in her case, so doses have been kept the same long term and other meds used including the lidoderm patches, mobic, cymbalta.  Bo notes she's had increased pain in her shoulders and legs and she also admits to this during visit.  She has been on oxycodone $RemoveBefo'5mg'rqwBkRgCejA$  po q 6 hrs scheduled.  She has oxycodone $RemoveBefor'5mg'EjGYYiizIcgh$  po q 4 hours prn moderate to severe breakthrough pain, but she does not ask for her medication on her own.  She is notably distressed when moved, but fairly comfortable at rest.  She says her pain is 5/10 at its worst also, so she would not get additional medication for that rating.  Bo would like to be able to take  her out to the symphony in the coming weeks even if it's "only for an hour".  We discussed that this would be incredibly challenging given her frail state, but he still wants to try it.  She has a great love of this type of music.  She is dependent in all ADLs including feeding (concho diet mechanical soft with thickened liquids), bid frozen treat, aspiration precautions, and osmolite tube feeding 1.5cal 60cc/hr via g tube bid.  Receives meds orally.     Review of Systems  Constitutional: Positive for malaise/fatigue. Negative for fever and chills.  HENT: Negative for congestion.   Eyes:       Blindness  Respiratory: Negative for shortness of breath.        On chronic oxygen therapy  Cardiovascular: Negative for chest pain and leg swelling.  Gastrointestinal: Negative for abdominal pain and constipation.  Genitourinary: Negative for dysuria.  Musculoskeletal: Positive for myalgias and joint pain. Negative for falls.  Neurological: Positive for weakness. Negative for dizziness.  Endo/Heme/Allergies: Bruises/bleeds easily.  Psychiatric/Behavioral: Positive for memory loss.       Bipolar disorder    Past Medical History  Diagnosis Date  . COPD (chronic obstructive pulmonary disease)   . Hypertension   . Blind   . Hypothyroid   . Pacemaker   . Renal insufficiency   . Finger fracture   . Dementia   . Bipolar disorder   . Dysphagia     needs thick nectar  liquids  . Atrial fibrillation   . Pneumonia   . Anxiety   . Diastolic heart failure    Past Surgical History  Procedure Laterality Date  . Insert / replace / remove pacemaker    . Tracheostomy  02/27/13    decannulated 03/29/13  . Gastrostomy tube placement  03/11/13    No Known Allergies    Medication List       This list is accurate as of: 08/18/15 11:59 PM.  Always use your most recent med list.               CENTAMIN Liqd  Take 5 mLs by mouth daily.     docusate 50 MG/5ML liquid  Commonly known as:  COLACE    Take 100 mg by mouth 2 (two) times daily.     DULoxetine 60 MG capsule  Commonly known as:  CYMBALTA  Take 60 mg by mouth daily.     famotidine 20 MG tablet  Commonly known as:  PEPCID  Take 1 tablet (20 mg total) by mouth 2 (two) times daily.     feeding supplement (OSMOLITE 1.5 CAL) Liqd  Place 60 mLs into feeding tube continuous.     food thickener Powd  Commonly known as:  THICK IT  Take 1 Container by mouth as needed. Nectar thick     furosemide 20 MG tablet  Commonly known as:  LASIX  Take 20 mg by mouth daily.     ipratropium-albuterol 0.5-2.5 (3) MG/3ML Soln  Commonly known as:  DUONEB  Take 3 mLs by nebulization every 4 (four) hours as needed.     levothyroxine 125 MCG tablet  Commonly known as:  SYNTHROID, LEVOTHROID  Take 125 mcg by mouth daily before breakfast.     lidocaine 5 %  Commonly known as:  LIDODERM  Place 1 patch onto the skin daily. Apply 1/2 patch to each shoulder and to each knee for 12 hours, then remove     meloxicam 15 MG tablet  Commonly known as:  MOBIC  Take 15 mg by mouth daily.     NAMZARIC 28-10 MG Cp24  Generic drug:  Memantine HCl-Donepezil HCl  28 capsules by PEG Tube route daily.     oxyCODONE 5 MG immediate release tablet  Commonly known as:  Oxy IR/ROXICODONE  Take one tablet by mouth every 4 hours scheduled for pain; Take one tablet by mouth every 4 hours as needed for moderate to severe breakthrough pain     polyethylene glycol packet  Commonly known as:  MIRALAX / GLYCOLAX  Take 17 g by mouth daily.     QUEtiapine 50 MG tablet  Commonly known as:  SEROQUEL  Take 50 mg by mouth at bedtime.     SYSTANE BALANCE 0.6 % Soln  Generic drug:  Propylene Glycol  Apply 1 drop to eye 2 (two) times daily.     Valproic Acid 500 MG Cpdr  Take 500 mg by mouth 2 (two) times daily.        Immunization History  Administered Date(s) Administered  . Influenza-Unspecified 09/24/2012, 08/27/2014   Pertinent  Health Maintenance  Due  Topic Date Due  . URINE MICROALBUMIN  04/20/1940  . INFLUENZA VACCINE  06/15/2015  . HEMOGLOBIN A1C  08/15/2015  . OPHTHALMOLOGY EXAM  09/03/2015  . PNA vac Low Risk Adult (2 of 2 - PPSV23) 03/11/2016  . FOOT EXAM  07/15/2016  . DEXA SCAN  Addressed   Fall Risk  07/16/2015  Falls in the past year? No    There were no vitals filed for this visit. There is no weight on file to calculate BMI. Physical Exam  Constitutional:  Frail white female, was alert and conversive after her son aroused her, but initially had been sleepy and not wanting to participate in exam  Cardiovascular: Normal rate, regular rhythm, normal heart sounds and intact distal pulses.   Pulmonary/Chest: Effort normal and breath sounds normal.  Prolonged expiratory phase; wearing 2L O2 via [place]  Abdominal: Soft. Bowel sounds are normal.  PEG in place, receiving tube feeding  Neurological:  Knows Bo and where she is but not oriented to time  Skin: Skin is warm and dry. There is pallor.    Labs reviewed: No results for input(s): NA, K, CL, CO2, GLUCOSE, BUN, CREATININE, CALCIUM, MG, PHOS in the last 8760 hours. No results for input(s): AST, ALT, ALKPHOS, BILITOT, PROT, ALBUMIN in the last 8760 hours. No results for input(s): WBC, NEUTROABS, HGB, HCT, MCV, PLT in the last 8760 hours. Lab Results  Component Value Date   TSH 1.470 06/12/2014   Lab Results  Component Value Date   HGBA1C 5.6 06/07/2014   Lab Results  Component Value Date   CHOL 136 05/24/2011   HDL 64 05/24/2011   LDLCALC 50 05/24/2011   TRIG 93 06/10/2014   CHOLHDL 2.1 05/24/2011    Significant Diagnostic Results in last 30 days:  No results found.  Assessment/Plan 1. Chronic obstructive pulmonary disease, unspecified COPD, unspecified chronic bronchitis type -only on as needed duonebs -no recent exacerbations and has not been needing these lately -continues on oxygen therapy, as well  2. chronic respiratory failure with hypoxia and  hypercapnia ([place]) -continues on oxygen at 2L via [place] -does not tolerate high dose narcotics w/o decompensation  3. Chronic diastolic heart failure (HCC) -has been stable from this perspective w/o exacerbation -no currently on active therapy for this, but noted during a prior hospitalization  4. Dysphagia -cont aspiration precautions and modified diet plus tube feedings  5. PEG (percutaneous endoscopic gastrostomy) status ([place]) -continues on osmolyte tube feedings  -seems she's been stable from this perspective w/o any aspiration episodes as precautions are being taken -also receives a modified diet and nectar thickened liquids -previous attempts to rely exclusively on po intake have led to weight loss and decline  6. Dementia with behavioral disturbance -continues on namzaric--benefits at this point are questionable with her functional dependence--would consider taper off if Bo agrees in the future  7. Bipolar disorder, in partial remission, most recent episode depressed ([place]) -has been in partial remission for some time -continues on depakote, cymbalta and seroquel for this  8. Chronic pain -continue lidoderm, cymbalta, mobic, and increase scheduled oxycodone to 88m po q 4 hrs routinely, hold for sedation; keep prn oxycodone-resident must be asked about pain level and will get an additional 554mtablet of oxycodone only if pain remains in 6-10 range  9. FTT (failure to thrive) in adult -she continues to very gradually decline in her cognition and interactivity with her environment; she is most comfortable resting in bed, and has more pain with movement -MOST is for full aggressive care--needs to be readdressed at care plan meeting with me in attendance (last done in 11/15)  10. Torticollis, spasmodic -cont use of soft c collar and ear mates to prevent skin breakdown of ears which have historically caused cellulitis and conjunctivitis  Family/ staff Communication: met with BoHassie Bruceand we saw his  mother together.  Also discussed her condition with the attending nurse practitioner who sees her monthly, DNS, ADNS, and nursing supervisor.  35 minutes were spent.  Labs/tests ordered:  No new labs at present--has some coming up 10/5 (cmp, tsh, free t4)  Rasheeda Mulvehill L. Alishia Lebo, D.O. [facility] [address], [place] [address] Cell Phone (Mon-Fri 8am-5pm):  [phone] On Call:  [phone] & follow prompts after 5pm & weekends Office Phone:  [phone] Office Fax:  [phone]

## 2015-08-20 ENCOUNTER — Encounter: Payer: Self-pay | Admitting: Internal Medicine

## 2015-08-20 NOTE — Progress Notes (Signed)
Patient ID: [name], female   DOB: 10/25/1928, 81 y.o.   MRN: [id number]    DATE: 08/17/15  Location:  [facility]    Place of Service: SNF (31)   Extended Emergency Contact Information Primary Emergency Contact: Lose,[name] Address: [address]           [place], [place] [address] [place] Home Phone: [phone] Relation: Son Secondary Emergency Contact: Mondragon,Hope Address: [address]          [place], [place] [address] [place] Home Phone: [phone] Relation: Daughter  Advanced Directive information  FULL CODE; MOST FORM ON CHART  Chief Complaint  Patient presents with  . Medical Management of Chronic Issues    HPI:  81 yo female long term resident seen today for f/u. She states she would like to get OOB. She has no other c/o. She is a poor historian due to dementia. Hx obtained from chart. No falls. No nursing issues. She is sleeping well. She has dysphagia and requires thickened liquids. She gets osmolite 1.5 via Peg BID  COPD - stable. She takes duonebs  HTN - stable on lasix  Thyroid - stable on levothyroxine  CHF/afib s/p pacer - rate controlled with pacer. She takes no medication for HR. Swelling controlled with lasix  Dementia/bipolar - stable on namzeric, depakote, seroquel and cymbalta  Chronic pain due to OA - stable on meloxicam, lidoderm patches, and oxycodone  Hyperglycemia - CBG 138 today and range 120-140s usually. CBGs checked BID. Elevated probably due to nutritional supplements  Constipation stable with miralax and colace  She takes vitamins and minerals also  Past Medical History  Diagnosis Date  . COPD (chronic obstructive pulmonary disease) (HCC)   . Hypertension   . Blind   . Hypothyroid   . Pacemaker   . Renal insufficiency   . Finger fracture   . Dementia   . Bipolar disorder (HCC)   . Dysphagia     needs thick nectar liquids  . Atrial fibrillation (HCC)   .  Pneumonia   . Anxiety   . Diastolic heart failure [facility])     Past Surgical History  Procedure Laterality Date  . Insert / replace / remove pacemaker    . Tracheostomy  02/27/13    decannulated 03/29/13  . Gastrostomy tube placement  03/11/13    Patient Care Team: Kirt Boys, DO as PCP - General (Internal Medicine) Sharee Holster, NP as Nurse Practitioner (Nurse Practitioner)  Social History   Social History  . Marital Status: Widowed    Spouse Name: N/A  . Number of Children: N/A  . Years of Education: N/A   Occupational History  . Not on file.   Social History Main Topics  . Smoking status: Former Smoker -- .5 years    Types: Cigarettes  . Smokeless tobacco: Not on file  . Alcohol Use: Not on file  . Drug Use: No  . Sexual Activity: No   Other Topics Concern  . Not on file   Social History Narrative   Pt lives at [facility]     reports that she has quit smoking. Her smoking use included Cigarettes. She quit after .5 years of use. She does not have any smokeless tobacco history on file. She reports that she does not use illicit drugs. Her alcohol history is not on file.  Immunization History  Administered Date(s) Administered  . Influenza-Unspecified 09/24/2012, 08/27/2014  No Known Allergies  Medications: Patient's Medications  New Prescriptions   No medications on file  Previous Medications   DOCUSATE (COLACE) 50 MG/5ML LIQUID    Take 100 mg by mouth 2 (two) times daily.   DULOXETINE (CYMBALTA) 60 MG CAPSULE    Take 60 mg by mouth daily.   FAMOTIDINE (PEPCID) 20 MG TABLET    Take 1 tablet (20 mg total) by mouth 2 (two) times daily.   FOOD THICKENER (THICK IT) POWD    Take 1 Container by mouth as needed. Nectar thick   FUROSEMIDE (LASIX) 20 MG TABLET    Take 20 mg by mouth daily.   IPRATROPIUM-ALBUTEROL (DUONEB) 0.5-2.5 (3) MG/3ML SOLN    Take 3 mLs by nebulization every 4 (four) hours as needed.   LEVOTHYROXINE (SYNTHROID,  LEVOTHROID) 125 MCG TABLET    Take 125 mcg by mouth daily before breakfast.   LIDOCAINE (LIDODERM) 5 %    Place 1 patch onto the skin daily. Apply 1/2 patch to each shoulder and to each knee for 12 hours, then remove   MELOXICAM (MOBIC) 15 MG TABLET    Take 15 mg by mouth daily.   MEMANTINE HCL-DONEPEZIL HCL (NAMZARIC) 28-10 MG CP24    28 capsules by PEG Tube route daily.   MULTIPLE VITAMINS-MINERALS (CENTAMIN) LIQD    Take 5 mLs by mouth daily.   NUTRITIONAL SUPPLEMENTS (FEEDING SUPPLEMENT, OSMOLITE 1.5 CAL,) LIQD    Place 60 mLs into feeding tube continuous.   OXYCODONE (OXY IR/ROXICODONE) 5 MG IMMEDIATE RELEASE TABLET    Take one tablet by mouth every 4 hours scheduled for pain; Take one tablet by mouth every 4 hours as needed for moderate to severe breakthrough pain   POLYETHYLENE GLYCOL (MIRALAX / GLYCOLAX) PACKET    Take 17 g by mouth daily.   PROPYLENE GLYCOL (SYSTANE BALANCE) 0.6 % SOLN    Apply 1 drop to eye 2 (two) times daily.   QUETIAPINE (SEROQUEL) 50 MG TABLET    Take 50 mg by mouth at bedtime.   VALPROIC ACID 500 MG CPDR    Take 500 mg by mouth 2 (two) times daily.  Modified Medications   No medications on file  Discontinued Medications   No medications on file    Review of Systems  Unable to perform ROS: Dementia    Filed Vitals:   08/17/15 1157  BP: 127/76  Pulse: 67  Temp: 96.2 F (35.7 C)  Weight: 155 lb (70.308 kg)  SpO2: 96%   Body mass index is 25.79 kg/(m^2).  Physical Exam  Constitutional: She appears well-developed. No distress.  Lying in bed in NAD. [place] O2 not intact  HENT:  Mouth/Throat: Oropharynx is clear and moist. No oropharyngeal exudate.  Eyes: Pupils are equal, round, and reactive to light. No scleral icterus.  Neck: Neck supple. Muscular tenderness present. No spinous process tenderness present. Carotid bruit is not present. Decreased range of motion present. No tracheal deviation present. No thyromegaly present.  Neck pillow in place    Cardiovascular: Normal rate, regular rhythm and intact distal pulses.  Exam reveals no gallop and no friction rub.   Murmur (1/6 SEM) heard. No LE edema b/l. no calf TTP.   Pulmonary/Chest: Effort normal and breath sounds normal. No stridor. No respiratory distress. She has no wheezes. She has no rales.  Abdominal: Soft. Bowel sounds are normal. She exhibits no distension and no mass. There is no hepatomegaly. There is no tenderness. There is no rebound and no guarding.  Peg tube intact. No redness or d/c at insertion site  Musculoskeletal: She exhibits edema and tenderness.  Lidocaine patches intact on b/l anterior knee and shoulder  Lymphadenopathy:    She has no cervical adenopathy.  Neurological: She is alert.  Skin: Skin is warm and dry. No rash noted.  B/l dystrophic appearing toenails  Psychiatric: She has a normal mood and affect. Her behavior is normal.     Labs reviewed: No visits with results within 3 Month(s) from this visit.   No results found.   Assessment/Plan   ICD-9-CM ICD-10-CM   1. FTT (failure to thrive) in adult - worsening 783.7 R62.7   2. Dementia with behavioral disturbance - worsening cognition 294.21 F03.91   3. Chronic pain - stable 338.29 G89.29   4. Chronic diastolic heart failure (HCC) - stable 428.32 I50.32   5. Bipolar disorder, in partial remission, most recent episode depressed (HCC) - stable 296.55 F31.75   6. Essential hypertension, benign - controlled 401.1 I10   7. Pacemaker due to afib V45.01 Z95.0   8. Hyperglycemia - stable 790.29 R73.9   9. Hypothyroidism due to acquired atrophy of thyroid - stable 244.8 E03.8    246.8 E03.4   10. Dysphagia s/p Peg tube 787.20 R13.10   11. Chronic obstructive pulmonary disease, unspecified COPD, unspecified chronic bronchitis type - O2 dependent 496 J44.9     --check CMP, TSH and free T4  --cont other meds as ordered  --nutritional supplement as ordered  --Peg tube care as ordered. Aspiration  precautions  --[place] O2 ATC as ordered. Monitor O2 sats evey shift  --OT as ordered. PT/ST as indicated  --will follow  --plan to meet with pt's son next week to discuss end-of-life issues and code status  Nare Gaspari S. Ancil Linsey  [facility] [address] [place], [place] [address] [phone] Cell (Monday-Friday 8 AM - 5 PM) [phone] After 5 PM and follow prompts

## 2015-09-01 LAB — HM DIABETES FOOT EXAM

## 2015-09-21 ENCOUNTER — Other Ambulatory Visit: Payer: Self-pay | Admitting: *Deleted

## 2015-09-21 DIAGNOSIS — G8929 Other chronic pain: Secondary | ICD-10-CM

## 2015-09-21 MED ORDER — OXYCODONE HCL 5 MG PO TABS: ORAL_TABLET | ORAL | Status: DC

## 2015-09-21 NOTE — Telephone Encounter (Signed)
[facility] 

## 2015-09-22 ENCOUNTER — Non-Acute Institutional Stay (SKILLED_NURSING_FACILITY): Payer: Medicare Other | Admitting: Adult Health

## 2015-09-22 ENCOUNTER — Emergency Department (HOSPITAL_COMMUNITY)
Admission: EM | Admit: 2015-09-22 | Discharge: 2015-09-22 | Disposition: A | Discharge status: Home / Self Care | Payer: Medicare Other | Attending: Emergency Medicine | Admitting: Emergency Medicine

## 2015-09-22 ENCOUNTER — Encounter (HOSPITAL_COMMUNITY): Payer: Self-pay | Admitting: Emergency Medicine

## 2015-09-22 ENCOUNTER — Encounter: Payer: Self-pay | Admitting: Adult Health

## 2015-09-22 DIAGNOSIS — E034 Atrophy of thyroid (acquired): Secondary | ICD-10-CM

## 2015-09-22 DIAGNOSIS — F319 Bipolar disorder, unspecified: Secondary | ICD-10-CM | POA: Diagnosis not present

## 2015-09-22 DIAGNOSIS — Z87891 Personal history of nicotine dependence: Secondary | ICD-10-CM | POA: Diagnosis not present

## 2015-09-22 DIAGNOSIS — R1310 Dysphagia, unspecified: Secondary | ICD-10-CM | POA: Diagnosis not present

## 2015-09-22 DIAGNOSIS — F419 Anxiety disorder, unspecified: Secondary | ICD-10-CM | POA: Diagnosis not present

## 2015-09-22 DIAGNOSIS — Z8781 Personal history of (healed) traumatic fracture: Secondary | ICD-10-CM | POA: Insufficient documentation

## 2015-09-22 DIAGNOSIS — I5030 Unspecified diastolic (congestive) heart failure: Secondary | ICD-10-CM | POA: Diagnosis not present

## 2015-09-22 DIAGNOSIS — E038 Other specified hypothyroidism: Secondary | ICD-10-CM

## 2015-09-22 DIAGNOSIS — J449 Chronic obstructive pulmonary disease, unspecified: Secondary | ICD-10-CM | POA: Insufficient documentation

## 2015-09-22 DIAGNOSIS — Z791 Long term (current) use of non-steroidal anti-inflammatories (NSAID): Secondary | ICD-10-CM | POA: Diagnosis not present

## 2015-09-22 DIAGNOSIS — Z950 Presence of cardiac pacemaker: Secondary | ICD-10-CM | POA: Diagnosis not present

## 2015-09-22 DIAGNOSIS — Z79899 Other long term (current) drug therapy: Secondary | ICD-10-CM | POA: Diagnosis not present

## 2015-09-22 DIAGNOSIS — Z87448 Personal history of other diseases of urinary system: Secondary | ICD-10-CM | POA: Diagnosis not present

## 2015-09-22 DIAGNOSIS — G8929 Other chronic pain: Secondary | ICD-10-CM | POA: Insufficient documentation

## 2015-09-22 DIAGNOSIS — F3175 Bipolar disorder, in partial remission, most recent episode depressed: Secondary | ICD-10-CM

## 2015-09-22 DIAGNOSIS — E039 Hypothyroidism, unspecified: Secondary | ICD-10-CM | POA: Insufficient documentation

## 2015-09-22 DIAGNOSIS — N390 Urinary tract infection, site not specified: Secondary | ICD-10-CM | POA: Diagnosis not present

## 2015-09-22 DIAGNOSIS — F0390 Unspecified dementia without behavioral disturbance: Secondary | ICD-10-CM | POA: Diagnosis not present

## 2015-09-22 DIAGNOSIS — F0391 Unspecified dementia with behavioral disturbance: Secondary | ICD-10-CM

## 2015-09-22 DIAGNOSIS — Z8701 Personal history of pneumonia (recurrent): Secondary | ICD-10-CM | POA: Insufficient documentation

## 2015-09-22 DIAGNOSIS — I10 Essential (primary) hypertension: Secondary | ICD-10-CM | POA: Diagnosis not present

## 2015-09-22 DIAGNOSIS — F03918 Unspecified dementia, unspecified severity, with other behavioral disturbance: Secondary | ICD-10-CM

## 2015-09-22 LAB — URINALYSIS, ROUTINE W REFLEX MICROSCOPIC
Bilirubin Urine: NEGATIVE
GLUCOSE, UA: NEGATIVE mg/dL
HGB URINE DIPSTICK: NEGATIVE
KETONES UR: NEGATIVE mg/dL
Nitrite: NEGATIVE
PROTEIN: NEGATIVE mg/dL
Specific Gravity, Urine: 1.02 (ref 1.005–1.030)
UROBILINOGEN UA: 1 mg/dL (ref 0.0–1.0)
pH: 6.5 (ref 5.0–8.0)

## 2015-09-22 LAB — CBC WITH DIFFERENTIAL/PLATELET
BASOS PCT: 0 %
Basophils Absolute: 0 10*3/uL (ref 0.0–0.1)
EOS ABS: 0.6 10*3/uL (ref 0.0–0.7)
EOS PCT: 6 %
HCT: 34 % — ABNORMAL LOW (ref 36.0–46.0)
HEMOGLOBIN: 10.7 g/dL — AB (ref 12.0–15.0)
LYMPHS ABS: 1.5 10*3/uL (ref 0.7–4.0)
Lymphocytes Relative: 16 %
MCH: 32.9 pg (ref 26.0–34.0)
MCHC: 31.5 g/dL (ref 30.0–36.0)
MCV: 104.6 fL — ABNORMAL HIGH (ref 78.0–100.0)
Monocytes Absolute: 0.9 10*3/uL (ref 0.1–1.0)
Monocytes Relative: 9 %
NEUTROS PCT: 69 %
Neutro Abs: 6.8 10*3/uL (ref 1.7–7.7)
PLATELETS: 179 10*3/uL (ref 150–400)
RBC: 3.25 MIL/uL — AB (ref 3.87–5.11)
RDW: 13 % (ref 11.5–15.5)
WBC: 9.8 10*3/uL (ref 4.0–10.5)

## 2015-09-22 LAB — URINE MICROSCOPIC-ADD ON

## 2015-09-22 LAB — BASIC METABOLIC PANEL
Anion gap: 7 (ref 5–15)
BUN: 44 mg/dL — AB (ref 6–20)
CALCIUM: 8.8 mg/dL — AB (ref 8.9–10.3)
CO2: 36 mmol/L — ABNORMAL HIGH (ref 22–32)
CREATININE: 0.9 mg/dL (ref 0.44–1.00)
Chloride: 102 mmol/L (ref 101–111)
GFR, EST NON AFRICAN AMERICAN: 57 mL/min — AB (ref >60)
Glucose, Bld: 86 mg/dL (ref 65–99)
Potassium: 4.7 mmol/L (ref 3.5–5.1)
SODIUM: 145 mmol/L (ref 135–145)

## 2015-09-22 MED ORDER — ACETAMINOPHEN 500 MG PO TABS: 1000.0000 mg | ORAL_TABLET | Freq: Three times a day (TID) | ORAL | Status: DC

## 2015-09-22 MED ORDER — CEPHALEXIN 500 MG PO CAPS: 500.0000 mg | ORAL_CAPSULE | Freq: Two times a day (BID) | ORAL | Status: DC

## 2015-09-22 MED ORDER — CEPHALEXIN 500 MG PO CAPS
500.0000 mg | ORAL_CAPSULE | Freq: Once | ORAL | Status: AC
  Administered 2015-09-22: 500 mg via ORAL
  Filled 2015-09-22: qty 1

## 2015-09-22 MED ORDER — OXYCODONE-ACETAMINOPHEN 5-325 MG PO TABS
1.0000 | ORAL_TABLET | Freq: Once | ORAL | Status: AC
  Administered 2015-09-22: 1 via ORAL
  Filled 2015-09-22: qty 1

## 2015-09-22 NOTE — Discharge Instructions (Signed)
Please schedule a follow up appointment with your primary care provider for referral to pain clinic and further discussion of pain medication regimen.

## 2015-09-22 NOTE — ED Notes (Signed)
Called for [facility] to transport.  Tried to call facility but no one answered.

## 2015-09-22 NOTE — ED Notes (Signed)
Patient presents from [facility] via [facility] for "pain management" per son. 2L [place] chronically. Per facility patient has requested nothing for pain.  Last VS 132/88, 80hr, 20resp, 95%

## 2015-09-22 NOTE — ED Provider Notes (Signed)
CSN: [id number]     Arrival date & time 09/22/15  1658 History   First MD Initiated Contact with Patient 09/22/15 1738     No chief complaint on file.    (Consider location/radiation/quality/duration/timing/severity/associated sxs/prior Treatment) HPI Comments: 81 year old female with extensive past medical history including dementia, COPD, hypertension, bipolar disorder, atrial fibrillation, chronic pain who presents with pain. History limited due to the patient's dementia and obtained primarily from her son. The patient was brought in today for poorly controlled chronic pain. The patient has been evaluated by her PCP once monthly and has been on oxycodone and lidocaine patches but son reports that he often finds her complaining of pain and sensitive to touch because of the amount of pain she is having. The patient endorses pain in her neck and legs. She denies any chest or abdominal pain. Son reports that it is difficult to control her pain because she never asks for pain medications and he is very frustrated that they have not yet scheduled an appointment with chronic pain clinic. Her PCP evaluated her today and discussed options with son and he elected to bring her to ED for evaluation. Patient has not had any recent fevers, vomiting, or illness.  The history is provided by a relative.    Past Medical History  Diagnosis Date  . COPD (chronic obstructive pulmonary disease) (HCC)   . Hypertension   . Blind   . Hypothyroid   . Pacemaker   . Renal insufficiency   . Finger fracture   . Dementia   . Bipolar disorder (HCC)   . Dysphagia     needs thick nectar liquids  . Atrial fibrillation (HCC)   . Pneumonia   . Anxiety   . Diastolic heart failure [facility](HCC)    Past Surgical History  Procedure Laterality Date  . Insert / replace / remove pacemaker    . Tracheostomy  02/27/13    decannulated 03/29/13  . Gastrostomy tube placement  03/11/13   No family history on file. Social History   Substance Use Topics  . Smoking status: Former Smoker -- .5 years    Types: Cigarettes  . Smokeless tobacco: None  . Alcohol Use: None   OB History    No data available     Review of Systems 10 Systems reviewed and are negative for acute change except as noted in the HPI.    Allergies  Review of patient's allergies indicates no known allergies.  Home Medications   Prior to Admission medications   Medication Sig Start Date End Date Taking? Authorizing Provider  docusate sodium (COLACE) 100 MG capsule Take 100 mg by mouth daily.   Yes Historical Provider, MD  DULoxetine (CYMBALTA) 60 MG capsule Take 60 mg by mouth daily.   Yes Historical Provider, MD  famotidine (PEPCID) 20 MG tablet Take 1 tablet (20 mg total) by mouth 2 (two) times daily. 06/11/14  Yes Bernadene PersonKathryn A Whiteheart, NP  furosemide (LASIX) 20 MG tablet Take 20 mg by mouth daily.   Yes Historical Provider, MD  ipratropium-albuterol (DUONEB) 0.5-2.5 (3) MG/3ML SOLN Take 3 mLs by nebulization every 4 (four) hours as needed. Patient taking differently: Take 3 mLs by nebulization 3 (three) times daily.  06/11/14  Yes Bernadene PersonKathryn A Whiteheart, NP  levothyroxine (SYNTHROID, LEVOTHROID) 125 MCG tablet Take 125 mcg by mouth daily before breakfast.   Yes Historical Provider, MD  lidocaine (LIDODERM) 5 % Place 1 patch onto the skin daily. Apply 1/2 patch to each shoulder and  to each knee for 12 hours, then remove   Yes Historical Provider, MD  meloxicam (MOBIC) 15 MG tablet Take 15 mg by mouth daily.   Yes Historical Provider, MD  Memantine HCl-Donepezil HCl (NAMZARIC) 28-10 MG CP24 Take 1 capsule by mouth daily.   Yes Historical Provider, MD  Multiple Vitamin (MULTIVITAMIN WITH MINERALS) TABS tablet Take 1 tablet by mouth daily.   Yes Historical Provider, MD  Nutritional Supplements (FEEDING SUPPLEMENT, OSMOLITE 1.5 CAL,) LIQD Place 60 mLs into feeding tube 2 (two) times daily.    Yes Historical Provider, MD  oxyCODONE (OXY IR/ROXICODONE)  5 MG immediate release tablet Take one tablet by mouth every 4 hours for pain 09/21/15  Yes [name] L [name], DO  OXYGEN Inhale 2 [place] Gall[place] into the lungs continuous.   Yes Historical Provider, MD  polyethylene glycol (MIRALAX / GLYCOLAX) packet Take 17 g by mouth daily.   Yes Historical Provider, MD  Propylene Glycol (SYSTANE BALANCE) 0.6 % SOLN Apply 1 drop to eye 2 (two) times daily.   Yes Historical Provider, MD  QUEtiapine (SEROQUEL) 50 MG tablet Take 50 mg by mouth at bedtime.   Yes Historical Provider, MD  Valproic Acid 500 MG CPDR Take 500 mg by mouth 2 (two) times daily.   Yes Historical Provider, MD  acetaminophen (TYLENOL) 500 MG tablet Take 2 tablets (1,000 mg total) by mouth every 8 (eight) hours. 09/22/15   Laurence Spates, MD  cephALEXin (KEFLEX) 500 MG capsule Take 1 capsule (500 mg total) by mouth 2 (two) times daily. 09/22/15   Ambrose [place] Ameliana Brashear, MD   BP 122/79 mmHg  Pulse 63  Resp 15  SpO2 95% Physical Exam  Constitutional: She appears well-developed and well-nourished. No distress.  Frail, elderly female resting comfortably  HENT:  Head: Normocephalic and atraumatic.  Mouth/Throat: Oropharynx is clear and moist.  Moist mucous membranes  Eyes: Conjunctivae are normal. Pupils are equal, round, and reactive to light.  Neck:  Limited ROM 2/2 pain  Cardiovascular: Normal rate, regular rhythm, normal heart sounds and intact distal pulses.   No murmur heard. Pulmonary/Chest: Effort normal and breath sounds normal. No respiratory distress.  Abdominal: Soft. Bowel sounds are normal. She exhibits no distension. There is no tenderness.  Musculoskeletal: She exhibits no edema.  4/5 strength x all 4 ext, pain w/ movement of legs, no obvious deformities; normal sensation throughout  Neurological: She is alert.  Fluent speech  Skin: Skin is warm and dry. No rash noted.  Nursing note and vitals reviewed.   ED Course  Procedures (including critical care time) Labs  Review Labs Reviewed  BASIC METABOLIC PANEL - Abnormal; Notable for the following:    CO2 36 (*)    BUN 44 (*)    Calcium 8.8 (*)    GFR calc non Af Amer 57 (*)    All other components within normal limits  CBC WITH DIFFERENTIAL/PLATELET - Abnormal; Notable for the following:    RBC 3.25 (*)    Hemoglobin 10.7 (*)    HCT 34.0 (*)    MCV 104.6 (*)    All other components within normal limits  URINALYSIS, ROUTINE W REFLEX MICROSCOPIC (NOT AT [facility]) - Abnormal; Notable for the following:    APPearance CLOUDY (*)    Leukocytes, UA LARGE (*)    All other components within normal limits  URINE MICROSCOPIC-ADD ON - Abnormal; Notable for the following:    Casts HYALINE CASTS (*)    All other components within normal  limits  URINE CULTURE    Imaging Review No results found. I have personally reviewed and evaluated these lab results as part of my medical decision-making.   EKG Interpretation None     Medications  oxyCODONE-acetaminophen (PERCOCET/ROXICET) 5-325 MG per tablet 1 tablet (1 tablet Oral Given 09/22/15 1903)  cephALEXin (KEFLEX) capsule 500 mg (500 mg Oral Given 09/22/15 2107)    MDM   Final diagnoses:  Urinary tract infection in elderly patient  Chronic pain  81 year old female with chronic pain who presents with poorly controlled pain at her nursing facility according to son. Patient comfortable at presentation with reassuring vital signs. She was moving all 4 extremities with no neurologic deficits on exam. Son reports that her pain is chronic. Obtained basic labs which showed UTI. Gave the patient Keflex as well as a dose of Percocet for pain. I discussed antibiotic treatment for UTI with the patient's son. I also extensively discussed chronic pain management and provided prescription for Tylenol to use on a schedule throughout the day to augment her current pain regimen. Emphasized importance of close follow-up with PCP to consider other pain management options such as  fentanyl patch and pain clinic referral. Son voiced understanding and was in agreement with plan. Patient discharged in satisfactory condition.  Laurence Spates, MD 09/22/15 2135

## 2015-09-22 NOTE — Progress Notes (Signed)
Patient ID: [name], female   DOB: 02/10/1928, 81 y.o.   MRN: [id number]    [facility]      No Known Allergies  Chief Complaint  Patient presents with  . Medical Management of Chronic Issues  . Acute Visit    pain management     HPI:  She is a long term resident of this facility being seen for the management of her chronic illnesses. Her son is very concerned about her pain management. She is presently receiving oxycodone 5 mg every 4 hours routinely; with mobic 15 mg daily; and uses lidoderm patches to her shoulders and knees. This morning she denied pain; this afternoon she is complaining of pain "all over". Her son is concerned that her pain is not being adequately managed and wants her hospitalized for her pain management.  He did become frustrated when told that she would have to go to through the ED for a possible hospital admission. That neither myself nor the physician can directly admit someone to the hospital. The offer was made for a pain clinic appointment. At this time he is wanting her to go the to ED for a further workup of her pain causes and treatment options.    Past Medical History  Diagnosis Date  . COPD (chronic obstructive pulmonary disease) (HCC)   . Hypertension   . Blind   . Hypothyroid   . Pacemaker   . Renal insufficiency   . Finger fracture   . Dementia   . Bipolar disorder (HCC)   . Dysphagia     needs thick nectar liquids  . Atrial fibrillation (HCC)   . Pneumonia   . Anxiety   . Diastolic heart failure [facility](HCC)     Past Surgical History  Procedure Laterality Date  . Insert / replace / remove pacemaker    . Tracheostomy  02/27/13    decannulated 03/29/13  . Gastrostomy tube placement  03/11/13    VITAL SIGNS BP 117/66 mmHg  Pulse 80  Ht 5\' 5"  (1.651 m)  Wt 155 lb (70.308 kg)  BMI 25.79 kg/m2  SpO2 94%  Patient's Medications  New Prescriptions   No medications on file  Previous Medications   DOCUSATE  (COLACE) 50 MG/5ML LIQUID    Take 100 mg by mouth 2 (two) times daily.   DULOXETINE (CYMBALTA) 60 MG CAPSULE    Take 60 mg by mouth daily.   FAMOTIDINE (PEPCID) 20 MG TABLET    Take 1 tablet (20 mg total) by mouth 2 (two) times daily.   FOOD THICKENER (THICK IT) POWD    Take 1 Container by mouth as needed. Nectar thick   FUROSEMIDE (LASIX) 20 MG TABLET    Take 20 mg by mouth daily.   IPRATROPIUM-ALBUTEROL (DUONEB) 0.5-2.5 (3) MG/3ML SOLN    Take 3 mLs by nebulization every 4 (four) hours as needed.   LEVOTHYROXINE (SYNTHROID, LEVOTHROID) 125 MCG TABLET    Take 125 mcg by mouth daily before breakfast.   LIDOCAINE (LIDODERM) 5 %    Place 1 patch onto the skin daily. Apply 1/2 patch to each shoulder and to each knee for 12 hours, then remove   MELOXICAM (MOBIC) 15 MG TABLET    Take 15 mg by mouth daily.   MEMANTINE HCL-DONEPEZIL HCL (NAMZARIC) 28-10 MG CP24    28 capsules by PEG Tube route daily.   MULTIPLE VITAMINS-MINERALS (CENTAMIN) LIQD    Take 5 mLs by mouth daily.   NUTRITIONAL SUPPLEMENTS (FEEDING  SUPPLEMENT, OSMOLITE 1.5 CAL,) LIQD    Place 60 mLs into feeding tube continuous.   OXYCODONE (OXY IR/ROXICODONE) 5 MG IMMEDIATE RELEASE TABLET    Take one tablet by mouth every 4 hours for pain   POLYETHYLENE GLYCOL (MIRALAX / GLYCOLAX) PACKET    Take 17 g by mouth daily.   PROPYLENE GLYCOL (SYSTANE BALANCE) 0.6 % SOLN    Apply 1 drop to eye 2 (two) times daily.   QUETIAPINE (SEROQUEL) 50 MG TABLET    Take 50 mg by mouth at bedtime.   VALPROIC ACID 500 MG CPDR    Take 500 mg by mouth 2 (two) times daily.  Modified Medications   No medications on file  Discontinued Medications   No medications on file     SIGNIFICANT DIAGNOSTIC EXAMS   She is not a candidate for dexa scan; mammogram; colonoscopy   LABS REVIEWED:   10-18-14: wbc 7.9; hgb 12.1; hct 39.7 ;mcv 102.6;plt 201; glucose 88; bun 29.9; creat 0.51; k+4.3; na++140; liver normal albumin 3.1; tsh 1.53; pre-albumin 19; depakote  31 10-27-14: hgb a1c 5.4  02-13-15: tsh 1.23; hgb a1c 5.3  04-08-15: glucose 120; bun 38.9; creat 0.76; k+4.6; na++ 147; liver normal albumin 3.3  06-10-15: wbc 7.8 ;hgb 11.7; hct 39.1; mcv 106.9; plt 170; glucose 120; bun 38.9; creat 0.78; k+ 4.6; na++147; liver normal albumin 3.3; tsh 1.23; hgb a1c 5.3 depakote 50 08-19-15: glucose 87; bun 41.0; creat 0.72; k+ 4.9; na++139; liver normal albumin 2.9; tsh 2.90;  free t4: 1.01    Review of Systems Unable to perform ROS: Dementia     Physical Exam Constitutional: No distress.  Frail  Neck: Neck supple. No JVD present. No thyromegaly present.  Cardiovascular: Normal rate, regular rhythm and intact distal pulses.   Respiratory: Effort normal and breath sounds normal. No respiratory distress.  GI: Soft. Bowel sounds are normal. She exhibits no distension. There is no tenderness.  Has peg tube  Musculoskeletal: She exhibits no edema.  Is able to move extremities; has generalized weakness present   Neurological: She is alert.  Skin: Skin is warm and dry. She is not diaphoretic.      ASSESSMENT/ PLAN:  1. Dysphagia: she is on nectar thick liquids and is dependent upon  peg tube feedings. There are no signs of aspiration present. Will not make changes will monitor her status.   2.  COPD: no change in her status: does require 02. Will continue duoneb three times daily will monitor her status.   3. Hypothyroidism; her tsh is 1.23; will continue synthroid 125 mcg daily   4. Diastolic heart failure: will continue lasix 20 mg daily and will monitor does have a pacemaker   5. Dementia with behavioral disturbance: no significant change in her status; will continue namzareic 28/10 mg daily and will monitor her status.   6. Bipolar affective disorder: will continue seroquel 50 mg daily; cymbalta 60 mg daily; and depakote 500 mg twice daily and will monitor   7. Constipation: will continue miralax daily and colace twice daily   8. Gerd: will  continue pepcid 20 mg twice daily   9. Chronic pain in multiple joints:  will continue mobic 15 mg daily; oxycodone 5 mg every 4 hours routinely  will  Continue lidoderm patch 1/2 patch to each shoulder and knee daily; will continue cymbalta 60 mg daily and will monitor    Will send her to the ED at this time.    Time spent with patient  50  minutes >50% time spent counseling; reviewing medical record; tests; labs; and developing future plan of care     Synthia Innocent NP [facility]  Contact [phone] Monday through Friday 8am- 5pm  After hours call [phone]

## 2015-09-22 NOTE — ED Notes (Signed)
Bed: [facility] Expected date:  Expected time:  Means of arrival:  Comments: No bed 

## 2015-09-22 NOTE — ED Notes (Signed)
[facility] at bedside to transport back to nursing facility.

## 2015-09-24 ENCOUNTER — Other Ambulatory Visit: Payer: Self-pay | Admitting: *Deleted

## 2015-09-24 ENCOUNTER — Encounter: Payer: Self-pay | Admitting: Internal Medicine

## 2015-09-24 ENCOUNTER — Non-Acute Institutional Stay (SKILLED_NURSING_FACILITY): Payer: Medicare Other | Admitting: Internal Medicine

## 2015-09-24 DIAGNOSIS — G8929 Other chronic pain: Secondary | ICD-10-CM

## 2015-09-24 LAB — URINE CULTURE: Special Requests: NORMAL

## 2015-09-24 MED ORDER — OXYCODONE HCL 5 MG PO TABS
ORAL_TABLET | ORAL | Status: DC
Start: 2015-09-24 — End: 2016-02-18

## 2015-09-24 NOTE — Progress Notes (Signed)
MRN: [id number] Name: [name]  Sex: female Age: 81 y.o. DOB: 02/14/1928  [facility] Facility[facility]Room:219 Level Of Care: SNF Provider: Merrilee SeashoreALEXANDER, Jake Fuhrmann D Emergency Contacts: Extended Emergency Contact Information Primary Emergency Contact: Whisenant,[name] Address: [address]           [place], [place] [id number] [place] Home Phone: [phone] Relation: Son Secondary Emergency Contact: Mondragon,Hope Address: [address]          [place], [place] [id number] [place] Home Phone: [phone] Relation: Daughter  Code Status:   Allergies: Review of patient's allergies indicates no known allergies.  Chief Complaint  Patient presents with  . Acute Visit    HPI: Patient is 81 y.o. female with  h/o COPD, HTN, bipolar disorder, dysphagia on tube feeding via peg, blindness, generalized frailty, dementia likely vascular in nature. She's had chronic pain with some dependence on narcotic medications. Historically, higher doses of narcotics have caused oversedation and respiratory failure in her case, so doses have been kept the same long term and other meds used including the lidoderm patches, mobic, cymbalta. After visit with MD 08/2015 pt's pain meds were increased to oxycodone q4 scheduled, hold if somnalent . After the pt was evaluated by her MD and son in the facility the son eleceted to take her to the ED for evaluation. This predictably did not realize any new treatments.I am asked to see the pt in f/u to her ED visit.  Past Medical History  Diagnosis Date  . COPD (chronic obstructive pulmonary disease) (HCC)   . Hypertension   . Blind   . Hypothyroid   . Pacemaker   . Renal insufficiency   . Finger fracture   . Dementia   . Bipolar disorder (HCC)   . Dysphagia     needs thick nectar liquids  . Atrial fibrillation (HCC)   . Pneumonia   . Anxiety   . Diastolic heart failure [facility](HCC)     Past Surgical History  Procedure Laterality Date  .  Insert / replace / remove pacemaker    . Tracheostomy  02/27/13    decannulated 03/29/13  . Gastrostomy tube placement  03/11/13      Medication List       This list is accurate as of: 09/24/15 11:59 PM.  Always use your most recent med list.               acetaminophen 500 MG tablet  Commonly known as:  TYLENOL  Take 2 tablets (1,000 mg total) by mouth every 8 (eight) hours.     cephALEXin 500 MG capsule  Commonly known as:  KEFLEX  Take 1 capsule (500 mg total) by mouth 2 (two) times daily.     docusate sodium 100 MG capsule  Commonly known as:  COLACE  Take 100 mg by mouth daily.     DULoxetine 60 MG capsule  Commonly known as:  CYMBALTA  Take 60 mg by mouth daily.     famotidine 20 MG tablet  Commonly known as:  PEPCID  Take 1 tablet (20 mg total) by mouth 2 (two) times daily.     feeding supplement (OSMOLITE 1.5 CAL) Liqd  Place 60 mLs into feeding tube 2 (two) times daily.     furosemide 20 MG tablet  Commonly known as:  LASIX  Take 20 mg by mouth daily.     ipratropium-albuterol 0.5-2.5 (3) MG/3ML Soln  Commonly known as:  DUONEB  Take 3 mLs  by nebulization every 4 (four) hours as needed.     levothyroxine 125 MCG tablet  Commonly known as:  SYNTHROID, LEVOTHROID  Take 125 mcg by mouth daily before breakfast.     lidocaine 5 %  Commonly known as:  LIDODERM  Place 1 patch onto the skin daily. Apply 1/2 patch to each shoulder and to each knee for 12 hours, then remove     meloxicam 15 MG tablet  Commonly known as:  MOBIC  Take 15 mg by mouth daily.     multivitamin with minerals Tabs tablet  Take 1 tablet by mouth daily.     NAMZARIC 28-10 MG Cp24  Generic drug:  Memantine HCl-Donepezil HCl  Take 1 capsule by mouth daily.     oxyCODONE 5 MG immediate release tablet  Commonly known as:  Oxy IR/ROXICODONE  Take one tablet by mouth every 4 hours for pain     OXYGEN  Inhale 2 Imperial Gallon into the lungs continuous.     polyethylene glycol  packet  Commonly known as:  MIRALAX / GLYCOLAX  Take 17 g by mouth daily.     QUEtiapine 50 MG tablet  Commonly known as:  SEROQUEL  Take 50 mg by mouth at bedtime.     SYSTANE BALANCE 0.6 % Soln  Generic drug:  Propylene Glycol  Apply 1 drop to eye 2 (two) times daily.     Valproic Acid 500 MG Cpdr  Take 500 mg by mouth 2 (two) times daily.        No orders of the defined types were placed in this encounter.    Immunization History  Administered Date(s) Administered  . Influenza-Unspecified 09/24/2012, 08/27/2014  . Pneumococcal-Unspecified 01/12/2015    Social History  Substance Use Topics  . Smoking status: Former Smoker -- .5 years    Types: Cigarettes  . Smokeless tobacco: Not on file  . Alcohol Use: Not on file    Review of Systems  DATA OBTAINED: from patient, nurse, medical record GENERAL:  no fevers, fatigue, appetite changes SKIN: No itching, rash HEENT: No complaint RESPIRATORY: No cough, wheezing, SOB CARDIAC: No chest pain, palpitations, lower extremity edema  GI: No abdominal pain, No N/V/D or constipation, No heartburn or reflux  GU: No dysuria, frequency or urgency, or incontinence  MUSCULOSKELETAL: No unrelieved bone/joint pain- pt did not speak of her pain NEUROLOGIC: No headache, dizziness  PSYCHIATRIC: No overt anxiety or sadness;pt assured me she wants to leave the facility to go to the doctors office.  Filed Vitals:   09/24/15 1602  BP: 132/58  Pulse: 72  Temp: 94.4 F (34.7 C)  Resp: 17    Physical Exam  GENERAL APPEARANCE: Alert, conversant, No acute distress  SKIN: No diaphoresis rash, or wounds HEENT: Unremarkable x pt is blind RESPIRATORY: Breathing is even, unlabored. Lung sounds are clear   CARDIOVASCULAR: Heart RRR no murmurs, rubs or gallops. No peripheral edema  GASTROINTESTINAL: Abdomen is soft, non-tender, not distended w/ normal bowel sounds; PEG.  GENITOURINARY: Bladder non tender, not distended  MUSCULOSKELETAL:  No abnormal joints or musculature NEUROLOGIC: Cranial nerves 2-12 grossly intact. Moves all extremities, oriented to self and place PSYCHIATRIC: Mood and affect appropriate to situation with dementia, no behavioral issues  Patient Active Problem List   Diagnosis Date Noted  . Chronic diastolic heart failure (HCC) 10/29/2014  . Emphysema of lung (HCC) 09/17/2014  . Hypothyroidism due to acquired atrophy of thyroid 09/17/2014  . FTT (failure to thrive) in adult 07/13/2014  .  UTI (urinary tract infection) 07/13/2014  . PEG (percutaneous endoscopic gastrostomy) status (HCC) 07/13/2014  . Bipolar affective disorder (HCC) 02/15/2013  . Tardive dyskinesia 02/15/2013  . Essential hypertension, benign 02/15/2013  . COPD (chronic obstructive pulmonary disease) (HCC) 02/15/2013  . Constipation 02/15/2013  . Weight loss 02/15/2013  . Dysphagia 02/15/2013  . Chronic pain 02/15/2013  . Dementia with behavioral disturbance 08/16/2012  . Pacemaker 08/16/2012    CBC    Component Value Date/Time   WBC 9.8 09/22/2015 1828   RBC 3.25* 09/22/2015 1828   HGB 10.7* 09/22/2015 1828   HCT 34.0* 09/22/2015 1828   PLT 179 09/22/2015 1828   MCV 104.6* 09/22/2015 1828   LYMPHSABS 1.5 09/22/2015 1828   MONOABS 0.9 09/22/2015 1828   EOSABS 0.6 09/22/2015 1828   BASOSABS 0.0 09/22/2015 1828    CMP     Component Value Date/Time   NA 145 09/22/2015 1828   K 4.7 09/22/2015 1828   CL 102 09/22/2015 1828   CO2 36* 09/22/2015 1828   GLUCOSE 86 09/22/2015 1828   BUN 44* 09/22/2015 1828   CREATININE 0.90 09/22/2015 1828   CALCIUM 8.8* 09/22/2015 1828   PROT 6.8 06/30/2014 0614   ALBUMIN 2.6* 06/30/2014 0614   AST 12 06/30/2014 0614   ALT 7 06/30/2014 0614   ALKPHOS 92 06/30/2014 0614   BILITOT <0.2* 06/30/2014 0614   GFRNONAA 57* 09/22/2015 1828   GFRAA >60 09/22/2015 1828    Assessment and Plan  Chronic pain After reviewing records, speaking to staff and speaking to pt it appears the next  step is to refer her to pain management. I know her son wants her there. I have written the order.    Time spent 25 min;> 50% of time with patient was spent reviewing records, labs, tests and studies, counseling and developing plan of care  Melanie Hanks, MD

## 2015-09-24 NOTE — Telephone Encounter (Signed)
[facility] 

## 2015-09-27 NOTE — Assessment & Plan Note (Signed)
After reviewing records, speaking to staff and speaking to pt it appears the next step is to refer her to pain management. I know her son wants her there. I have written the order.

## 2015-09-30 ENCOUNTER — Other Ambulatory Visit: Payer: Self-pay | Admitting: Internal Medicine

## 2015-09-30 DIAGNOSIS — R633 Feeding difficulties, unspecified: Secondary | ICD-10-CM

## 2015-09-30 LAB — HM DIABETES EYE EXAM

## 2015-10-06 ENCOUNTER — Other Ambulatory Visit: Payer: Self-pay | Admitting: Internal Medicine

## 2015-10-06 ENCOUNTER — Telehealth (HOSPITAL_COMMUNITY): Payer: Self-pay | Admitting: *Deleted

## 2015-10-06 ENCOUNTER — Ambulatory Visit (HOSPITAL_COMMUNITY)
Admission: RE | Admit: 2015-10-06 | Discharge: 2015-10-06 | Disposition: A | Discharge status: Home / Self Care | Payer: Medicare Other | Source: Ambulatory Visit | Attending: Internal Medicine | Admitting: Internal Medicine

## 2015-10-06 DIAGNOSIS — R633 Feeding difficulties, unspecified: Secondary | ICD-10-CM

## 2015-10-06 DIAGNOSIS — R1310 Dysphagia, unspecified: Secondary | ICD-10-CM | POA: Diagnosis not present

## 2015-10-06 DIAGNOSIS — Z431 Encounter for attention to gastrostomy: Secondary | ICD-10-CM | POA: Insufficient documentation

## 2015-10-06 MED ORDER — IOHEXOL 300 MG/ML  SOLN
10.0000 mL | Freq: Once | INTRAMUSCULAR | Status: DC | PRN
  Administered 2015-10-06: 10 mL
  Filled 2015-10-06: qty 10

## 2015-10-06 NOTE — Procedures (Signed)
Interventional Radiology Procedure Note   Patient was referred by the assisted living for a potentially "damaged" or non-functional tube.    Had extensive discussion with the son via telephone, primary care-taker, who confirms that he has no knowledge of a g-tube problem.  To his knowledge, the tube has been working.    His main concern is his family member's discomfort and chronic pain, and in fact he was anticipating that today's appointment was for a chronic pain specialist.    After discussing the potential risks and discomfort associated with replacing a 60F pull-through tube, he agrees that confirming function is all that should be considered today.    Again, his focus is on any upcoming chronic pain appointment for treatment, which I assured him our team does not provide.   It seems that his mother's primary physician has referred her for a future appointment, although no scheduled appointment is visible in the EPIC system.  I left a voicemail on son's cell number confirming this is the case.    Procedure: Injection of gastrostomy tube confirming position.  Complications: None Recommendations:  - tube continues to be in working order.     Signed,  Yvone NeuJaime S. Loreta AveWagner, DO

## 2015-10-22 ENCOUNTER — Non-Acute Institutional Stay (SKILLED_NURSING_FACILITY): Payer: Medicare Other | Admitting: Internal Medicine

## 2015-10-22 ENCOUNTER — Encounter: Payer: Self-pay | Admitting: Internal Medicine

## 2015-10-22 DIAGNOSIS — F3175 Bipolar disorder, in partial remission, most recent episode depressed: Secondary | ICD-10-CM

## 2015-10-22 DIAGNOSIS — R1310 Dysphagia, unspecified: Secondary | ICD-10-CM | POA: Diagnosis not present

## 2015-10-22 DIAGNOSIS — E034 Atrophy of thyroid (acquired): Secondary | ICD-10-CM

## 2015-10-22 DIAGNOSIS — J449 Chronic obstructive pulmonary disease, unspecified: Secondary | ICD-10-CM | POA: Diagnosis not present

## 2015-10-22 DIAGNOSIS — E038 Other specified hypothyroidism: Secondary | ICD-10-CM

## 2015-10-22 NOTE — Assessment & Plan Note (Signed)
In partial remission, depressed ; stable; plan - cont depakote, cymbalta and seroquel

## 2015-10-22 NOTE — Assessment & Plan Note (Signed)
No recent exacerbation known; pt uses prn nebs only but does use O2 regularly

## 2015-10-22 NOTE — Assessment & Plan Note (Signed)
In 08/2015 TSH 2.9 on synthroid on 125 mcg daily; plan - cont current meds

## 2015-10-22 NOTE — Assessment & Plan Note (Signed)
No known aspiration recently;plan cont modified diet plus PEG tube feeding

## 2015-10-22 NOTE — Progress Notes (Signed)
MRN: [id number] Name: [name]  Sex: female Age: 81 y.o. DOB: [birth year]  [facility] Facility/Room:219 Level Of Care: SNF Provider: Merrilee Seashore D Emergency Contacts: Extended Emergency Contact Information Primary Emergency Contact: Manahan,[name] Address: [address]           [place], [place] [address] [place] Home Phone: [phone] Relation: Son Secondary Emergency Contact: Mondragon,Hope Address: [address] [place] [address] [place] Home Phone: [phone] Relation: Daughter  Code Status:   Allergies: Review of patient's allergies indicates no known allergies.  Chief Complaint  Patient presents with  . Medical Management of Chronic Issues    HPI: Patient is 81 y.o. female with h/o COPD, HTN, bipolar disorder, dysphagia on tube feeding via peg, blindness, generalized frailty, dementia likely vascular in nature who is being seen for routine issues of Hypothyroidism, COPD, bipolar disorder and dysphagia.  Past Medical History  Diagnosis Date  . COPD (chronic obstructive pulmonary disease) (HCC)   . Hypertension   . Blind   . Hypothyroid   . Pacemaker   . Renal insufficiency   . Finger fracture   . Dementia   . Bipolar disorder (HCC)   . Dysphagia     needs thick nectar liquids  . Atrial fibrillation (HCC)   . Pneumonia   . Anxiety   . Diastolic heart failure [facility])     Past Surgical History  Procedure Laterality Date  . Insert / replace / remove pacemaker    . Tracheostomy  02/27/13    decannulated 03/29/13  . Gastrostomy tube placement  03/11/13      Medication List       This list is accurate as of: 10/22/15  7:38 PM.  Always use your most recent med list.               acetaminophen 500 MG tablet  Commonly known as:  TYLENOL  Take 2 tablets (1,000 mg total) by mouth every 8 (eight) hours.     cephALEXin 500 MG capsule  Commonly known as:  KEFLEX  Take 1 capsule (500 mg total) by  mouth 2 (two) times daily.     docusate sodium 100 MG capsule  Commonly known as:  COLACE  Take 100 mg by mouth daily.     DULoxetine 60 MG capsule  Commonly known as:  CYMBALTA  Take 60 mg by mouth daily.     famotidine 20 MG tablet  Commonly known as:  PEPCID  Take 1 tablet (20 mg total) by mouth 2 (two) times daily.     feeding supplement (OSMOLITE 1.5 CAL) Liqd  Place 60 mLs into feeding tube 2 (two) times daily.     furosemide 20 MG tablet  Commonly known as:  LASIX  Take 20 mg by mouth daily.     ipratropium-albuterol 0.5-2.5 (3) MG/3ML Soln  Commonly known as:  DUONEB  Take 3 mLs by nebulization every 4 (four) hours as needed.     levothyroxine 125 MCG tablet  Commonly known as:  SYNTHROID, LEVOTHROID  Take 125 mcg by mouth daily before breakfast.     lidocaine 5 %  Commonly known as:  LIDODERM  Place 1 patch onto the skin daily. Apply 1/2 patch to each shoulder and to each knee for 12 hours, then remove     meloxicam 15 MG tablet  Commonly known as:  MOBIC  Take 15 mg by mouth  daily.     multivitamin with minerals Tabs tablet  Take 1 tablet by mouth daily.     NAMZARIC 28-10 MG Cp24  Generic drug:  Memantine HCl-Donepezil HCl  Take 1 capsule by mouth daily.     oxyCODONE 5 MG immediate release tablet  Commonly known as:  Oxy IR/ROXICODONE  Take one tablet by mouth every 4 hours for pain     OXYGEN  Inhale 2 Imperial Gallon into the lungs continuous.     polyethylene glycol packet  Commonly known as:  MIRALAX / GLYCOLAX  Take 17 g by mouth daily.     QUEtiapine 50 MG tablet  Commonly known as:  SEROQUEL  Take 50 mg by mouth at bedtime.     SYSTANE BALANCE 0.6 % Soln  Generic drug:  Propylene Glycol  Apply 1 drop to eye 2 (two) times daily.     Valproic Acid 500 MG Cpdr  Take 500 mg by mouth 2 (two) times daily.        No orders of the defined types were placed in this encounter.    Immunization History  Administered Date(s) Administered   . Influenza-Unspecified 09/24/2012, 08/27/2014  . Pneumococcal-Unspecified 01/12/2015    Social History  Substance Use Topics  . Smoking status: Former Smoker -- .5 years    Types: Cigarettes  . Smokeless tobacco: Not on file  . Alcohol Use: Not on file    Review of Systems  DATA OBTAINED: from patient, nurse GENERAL:  no fevers, fatigue, appetite changes SKIN: No itching, rash HEENT: No complaint RESPIRATORY: No cough, wheezing, SOB CARDIAC: No chest pain, palpitations, lower extremity edema  GI: No abdominal pain, No N/V/D or constipation, No heartburn or reflux  GU: No dysuria, frequency or urgency, or incontinence  MUSCULOSKELETAL: No unrelieved bone/joint pain NEUROLOGIC: No headache, dizziness  PSYCHIATRIC: No overt anxiety or sadness  Filed Vitals:   10/22/15 1430  BP: 157/71  Pulse: 63  Temp: 96.6 F (35.9 C)  Resp: 19    Physical Exam  GENERAL APPEARANCE: Alert, conversant, No acute distress  SKIN: No diaphoresis rash, or wounds HEENT: Unremarkable X pt is blind RESPIRATORY: Breathing is even, unlabored. Lung sounds are clear   CARDIOVASCULAR: Heart RRR no murmurs, rubs or gallops. No peripheral edema  GASTROINTESTINAL: Abdomen is soft, non-tender, not distended w/ normal bowel sounds; PEG.  GENITOURINARY: Bladder non tender, not distended  MUSCULOSKELETAL: No abnormal joints or musculature NEUROLOGIC: Cranial nerves 2-12 grossly intact. Moves all extremities PSYCHIATRIC: Mood and affect appropriate to situation with dementia, no behavioral issues  Patient Active Problem List   Diagnosis Date Noted  . Chronic diastolic heart failure (HCC) 10/29/2014  . Emphysema of lung (HCC) 09/17/2014  . Hypothyroidism due to acquired atrophy of thyroid 09/17/2014  . FTT (failure to thrive) in adult 07/13/2014  . UTI (urinary tract infection) 07/13/2014  . PEG (percutaneous endoscopic gastrostomy) status (HCC) 07/13/2014  . Bipolar affective disorder (HCC)  02/15/2013  . Tardive dyskinesia 02/15/2013  . Essential hypertension, benign 02/15/2013  . COPD (chronic obstructive pulmonary disease) (HCC) 02/15/2013  . Constipation 02/15/2013  . Weight loss 02/15/2013  . Dysphagia 02/15/2013  . Chronic pain 02/15/2013  . Dementia with behavioral disturbance 08/16/2012  . Pacemaker 08/16/2012    CBC    Component Value Date/Time   WBC 9.8 09/22/2015 1828   RBC 3.25* 09/22/2015 1828   HGB 10.7* 09/22/2015 1828   HCT 34.0* 09/22/2015 1828   PLT 179 09/22/2015 1828   MCV 104.6*  09/22/2015 1828   LYMPHSABS 1.5 09/22/2015 1828   MONOABS 0.9 09/22/2015 1828   EOSABS 0.6 09/22/2015 1828   BASOSABS 0.0 09/22/2015 1828    CMP     Component Value Date/Time   NA 145 09/22/2015 1828   K 4.7 09/22/2015 1828   CL 102 09/22/2015 1828   CO2 36* 09/22/2015 1828   GLUCOSE 86 09/22/2015 1828   BUN 44* 09/22/2015 1828   CREATININE 0.90 09/22/2015 1828   CALCIUM 8.8* 09/22/2015 1828   PROT 6.8 06/30/2014 0614   ALBUMIN 2.6* 06/30/2014 0614   AST 12 06/30/2014 0614   ALT 7 06/30/2014 0614   ALKPHOS 92 06/30/2014 0614   BILITOT <0.2* 06/30/2014 0614   GFRNONAA 57* 09/22/2015 1828   GFRAA >60 09/22/2015 1828    Assessment and Plan  COPD (chronic obstructive pulmonary disease) No recent exacerbation known; pt uses prn nebs only but does use O2 regularly  Bipolar affective disorder In partial remission, depressed ; stable; plan - cont depakote, cymbalta and seroquel  Dysphagia No known aspiration recently;plan cont modified diet plus PEG tube feeding  Hypothyroidism due to acquired atrophy of thyroid In 08/2015 TSH 2.9 on synthroid on 125 mcg daily; plan - cont current meds    Margit Hanks, MD

## 2015-10-29 ENCOUNTER — Emergency Department (HOSPITAL_COMMUNITY)
Admission: EM | Admit: 2015-10-29 | Discharge: 2015-10-29 | Disposition: A | Discharge status: Home / Self Care | Payer: Medicare Other | Attending: Emergency Medicine | Admitting: Emergency Medicine

## 2015-10-29 ENCOUNTER — Emergency Department (HOSPITAL_COMMUNITY): Payer: Medicare Other

## 2015-10-29 ENCOUNTER — Encounter (HOSPITAL_COMMUNITY): Payer: Self-pay | Admitting: Emergency Medicine

## 2015-10-29 DIAGNOSIS — I5030 Unspecified diastolic (congestive) heart failure: Secondary | ICD-10-CM | POA: Diagnosis not present

## 2015-10-29 DIAGNOSIS — Z792 Long term (current) use of antibiotics: Secondary | ICD-10-CM | POA: Diagnosis not present

## 2015-10-29 DIAGNOSIS — Z87448 Personal history of other diseases of urinary system: Secondary | ICD-10-CM | POA: Insufficient documentation

## 2015-10-29 DIAGNOSIS — F319 Bipolar disorder, unspecified: Secondary | ICD-10-CM | POA: Diagnosis not present

## 2015-10-29 DIAGNOSIS — Z8701 Personal history of pneumonia (recurrent): Secondary | ICD-10-CM | POA: Insufficient documentation

## 2015-10-29 DIAGNOSIS — K9420 Gastrostomy complication, unspecified: Secondary | ICD-10-CM

## 2015-10-29 DIAGNOSIS — R109 Unspecified abdominal pain: Secondary | ICD-10-CM

## 2015-10-29 DIAGNOSIS — Z79899 Other long term (current) drug therapy: Secondary | ICD-10-CM | POA: Diagnosis not present

## 2015-10-29 DIAGNOSIS — Z791 Long term (current) use of non-steroidal anti-inflammatories (NSAID): Secondary | ICD-10-CM | POA: Diagnosis not present

## 2015-10-29 DIAGNOSIS — J449 Chronic obstructive pulmonary disease, unspecified: Secondary | ICD-10-CM | POA: Insufficient documentation

## 2015-10-29 DIAGNOSIS — Z87891 Personal history of nicotine dependence: Secondary | ICD-10-CM | POA: Diagnosis not present

## 2015-10-29 DIAGNOSIS — Z8781 Personal history of (healed) traumatic fracture: Secondary | ICD-10-CM | POA: Insufficient documentation

## 2015-10-29 DIAGNOSIS — K9423 Gastrostomy malfunction: Secondary | ICD-10-CM | POA: Insufficient documentation

## 2015-10-29 DIAGNOSIS — L03311 Cellulitis of abdominal wall: Secondary | ICD-10-CM | POA: Insufficient documentation

## 2015-10-29 DIAGNOSIS — F0390 Unspecified dementia without behavioral disturbance: Secondary | ICD-10-CM | POA: Insufficient documentation

## 2015-10-29 DIAGNOSIS — Z950 Presence of cardiac pacemaker: Secondary | ICD-10-CM | POA: Diagnosis not present

## 2015-10-29 DIAGNOSIS — F419 Anxiety disorder, unspecified: Secondary | ICD-10-CM | POA: Insufficient documentation

## 2015-10-29 DIAGNOSIS — H540 Blindness, both eyes: Secondary | ICD-10-CM | POA: Diagnosis not present

## 2015-10-29 DIAGNOSIS — I10 Essential (primary) hypertension: Secondary | ICD-10-CM | POA: Diagnosis not present

## 2015-10-29 DIAGNOSIS — E039 Hypothyroidism, unspecified: Secondary | ICD-10-CM | POA: Diagnosis not present

## 2015-10-29 MED ORDER — LIDOCAINE VISCOUS 2 % MT SOLN
OROMUCOSAL | Status: AC
  Administered 2015-10-29: 10 mL
  Filled 2015-10-29: qty 15

## 2015-10-29 MED ORDER — DOXYCYCLINE HYCLATE 100 MG PO CAPS: 100.0000 mg | ORAL_CAPSULE | Freq: Two times a day (BID) | ORAL | Status: DC

## 2015-10-29 MED ORDER — SILVER NITRATE-POT NITRATE 75-25 % EX MISC
CUTANEOUS | Status: AC
  Filled 2015-10-29: qty 1

## 2015-10-29 MED ORDER — SILVER NITRATE-POT NITRATE 75-25 % EX MISC: 1.0000 | Freq: Once | CUTANEOUS | Status: DC

## 2015-10-29 NOTE — Discharge Instructions (Signed)
Gastrostomy Tube Replacement, Care After Refer to this sheet in the next few weeks. These instructions provide you with information on caring for yourself after your procedure. Your health care provider may also give you more specific instructions. Your treatment has been planned according to current medical practices, but problems sometimes occur. Call your health care provider if you have any problems or questions after your procedure. WHAT TO EXPECT AFTER THE PROCEDURE  After your procedure, it is typical to have the following:   Mild abdominal pain.  A small amount of blood-tinged fluid leaking from the replacement site. HOME CARE INSTRUCTIONS  You may resume your normal level of activity.  You may resume your normal feedings.  Care for your gastrostomy tube as you did before, or as directed by your health care provider. SEEK MEDICAL CARE IF:  You have a fever or chills.  You have redness or irritation near the insertion site.  You continue to have abdominal pain or leaking around your gastrostomy tube. SEEK IMMEDIATE MEDICAL CARE IF:   You develop bleeding or significant discharge around the tube.  You have severe abdominal pain.  Your new tube does not seem to be working properly.  You are unable to get feedings into the tube.  Your tube comes out for any reason.    This information is not intended to replace advice given to you by your health care provider. Make sure you discuss any questions you have with your health care provider.   Document Released: 05/28/2014 Document Reviewed: 05/28/2014 Elsevier Interactive Patient Education 2016 Else[employer] Inc[employer] Cellulitis Cellulitis is an infection of the skin and the tissue beneath it. The infected area is usually red and tender. Cellulitis occurs most often in the arms and lower legs.  CAUSES  Cellulitis is caused by bacteria that enter the skin through cracks or cuts in the skin. The most common types of bacteria that  cause cellulitis are staphylococci and streptococci. SIGNS AND SYMPTOMS   Redness and warmth.  Swelling.  Tenderness or pain.  Fever. DIAGNOSIS  Your health care provider can usually determine what is wrong based on a physical exam. Blood tests may also be done. TREATMENT  Treatment usually involves taking an antibiotic medicine. HOME CARE INSTRUCTIONS   Take your antibiotic medicine as directed by your health care provider. Finish the antibiotic even if you start to feel better.  Keep the infected arm or leg elevated to reduce swelling.  Apply a warm cloth to the affected area up to 4 times per day to relieve pain.  Take medicines only as directed by your health care provider.  Keep all follow-up visits as directed by your health care provider. SEEK MEDICAL CARE IF:   You notice red streaks coming from the infected area.  Your red area gets larger or turns dark in color.  Your bone or joint underneath the infected area becomes painful after the skin has healed.  Your infection returns in the same area or another area.  You notice a swollen bump in the infected area.  You develop new symptoms.  You have a fever. SEEK IMMEDIATE MEDICAL CARE IF:   You feel very sleepy.  You develop vomiting or diarrhea.  You have a general ill feeling (malaise) with muscle aches and pains.   This information is not intended to replace advice given to you by your health care provider. Make sure you discuss any questions you have with your health care provider.   Document Released: 08/10/2005 Document  Revised: 07/22/2015 Document Reviewed: 01/16/2012 Elsevier Interactive Patient Education [employer]

## 2015-10-29 NOTE — ED Notes (Addendum)
Per [facility]. [facility]. The end of the pt's peg tube has been missing for some time. Was sent here to IR to have peg tube replaced. Staff told ptar that IR would work the patient into their schedule, and did not schedule an appointment. IR would not accept the patient because she did not have an appointment. [facility]. Pt has an hx of dementia.

## 2015-10-29 NOTE — ED Notes (Signed)
IR nurse at bedside

## 2015-10-29 NOTE — ED Notes (Signed)
Bed: [id number] Expected date:  Expected time:  Means of arrival:  Comments: PTAR

## 2015-10-29 NOTE — Procedures (Addendum)
Upon examination patient's existing G-tube had broken hub. Existing gastrostomy tube too short to place new hub, therefore existing tube was removed and exchanged out for a new 20 French balloon retention tube. Silver nitrate was used to cauterize surrounding granulation tissue at G-tube insertion site. Site was redressed with Vaseline and 4 x 4 gauze. No immediate complications.

## 2015-10-29 NOTE — ED Provider Notes (Signed)
CSN: [id number]     Arrival date & time 10/29/15  1191 History   First MD Initiated Contact with Patient 10/29/15 [phone]     Chief Complaint  Patient presents with  . PEG tube replacement      (Consider location/radiation/quality/duration/timing/severity/associated sxs/prior Treatment) HPI Comments: Patient here from nursing home to have her G-tube replaced. Unclear of how long the G-tube has been his current condition as patient does have dementia. I did speak with the nursing home directly they are unsure as well 2. No reported fever, vomiting. Patient is not short of breath. No other complaints of at this time  The history is provided by the patient. The history is limited by the condition of the patient.    Past Medical History  Diagnosis Date  . COPD (chronic obstructive pulmonary disease) (HCC)   . Hypertension   . Blind   . Hypothyroid   . Pacemaker   . Renal insufficiency   . Finger fracture   . Dementia   . Bipolar disorder (HCC)   . Dysphagia     needs thick nectar liquids  . Atrial fibrillation (HCC)   . Pneumonia   . Anxiety   . Diastolic heart failure [facility])    Past Surgical History  Procedure Laterality Date  . Insert / replace / remove pacemaker    . Tracheostomy  02/27/13    decannulated 03/29/13  . Gastrostomy tube placement  03/11/13   History reviewed. No pertinent family history. Social History  Substance Use Topics  . Smoking status: Former Smoker -- .5 years    Types: Cigarettes  . Smokeless tobacco: None  . Alcohol Use: None   OB History    No data available     Review of Systems  Unable to perform ROS: Dementia      Allergies  Review of patient's allergies indicates no known allergies.  Home Medications   Prior to Admission medications   Medication Sig Start Date End Date Taking? Authorizing Provider  acetaminophen (TYLENOL) 500 MG tablet Take 2 tablets (1,000 mg total) by mouth every 8 (eight) hours. 09/22/15   Laurence Spates,  MD  cephALEXin (KEFLEX) 500 MG capsule Take 1 capsule (500 mg total) by mouth 2 (two) times daily. 09/22/15   Laurence Spates, MD  docusate sodium (COLACE) 100 MG capsule Take 100 mg by mouth daily.    Historical Provider, MD  DULoxetine (CYMBALTA) 60 MG capsule Take 60 mg by mouth daily.    Historical Provider, MD  famotidine (PEPCID) 20 MG tablet Take 1 tablet (20 mg total) by mouth 2 (two) times daily. 06/11/14   Bernadene Person, NP  furosemide (LASIX) 20 MG tablet Take 20 mg by mouth daily.    Historical Provider, MD  ipratropium-albuterol (DUONEB) 0.5-2.5 (3) MG/3ML SOLN Take 3 mLs by nebulization every 4 (four) hours as needed. Patient taking differently: Take 3 mLs by nebulization 3 (three) times daily.  06/11/14   Bernadene Person, NP  levothyroxine (SYNTHROID, LEVOTHROID) 125 MCG tablet Take 125 mcg by mouth daily before breakfast.    Historical Provider, MD  lidocaine (LIDODERM) 5 % Place 1 patch onto the skin daily. Apply 1/2 patch to each shoulder and to each knee for 12 hours, then remove    Historical Provider, MD  meloxicam (MOBIC) 15 MG tablet Take 15 mg by mouth daily.    Historical Provider, MD  Memantine HCl-Donepezil HCl (NAMZARIC) 28-10 MG CP24 Take 1 capsule by mouth daily.  Historical Provider, MD  Multiple Vitamin (MULTIVITAMIN WITH MINERALS) TABS tablet Take 1 tablet by mouth daily.    Historical Provider, MD  Nutritional Supplements (FEEDING SUPPLEMENT, OSMOLITE 1.5 CAL,) LIQD Place 60 mLs into feeding tube 2 (two) times daily.     Historical Provider, MD  oxyCODONE (OXY IR/ROXICODONE) 5 MG immediate release tablet Take one tablet by mouth every 4 hours for pain 09/24/15   Sharon SellerJessica K Eubanks, NP  OXYGEN Inhale 2 Imperial Gallon into the lungs continuous.    Historical Provider, MD  polyethylene glycol (MIRALAX / GLYCOLAX) packet Take 17 g by mouth daily.    Historical Provider, MD  Propylene Glycol (SYSTANE BALANCE) 0.6 % SOLN Apply 1 drop to eye 2 (two) times  daily.    Historical Provider, MD  QUEtiapine (SEROQUEL) 50 MG tablet Take 50 mg by mouth at bedtime.    Historical Provider, MD  Valproic Acid 500 MG CPDR Take 500 mg by mouth 2 (two) times daily.    Historical Provider, MD   BP 150/109 mmHg  Pulse 78  Temp(Src) 97.5 F (36.4 C) (Oral)  Resp 16  SpO2 92% Physical Exam  Constitutional: She is oriented to person, place, and time. She appears well-developed and well-nourished.  Non-toxic appearance. No distress.  HENT:  Head: Normocephalic and atraumatic.  Eyes: Conjunctivae, EOM and lids are normal. Pupils are equal, round, and reactive to light.  Neck: Normal range of motion. Neck supple. No tracheal deviation present. No thyroid mass present.  Cardiovascular: Normal rate, regular rhythm and normal heart sounds.  Exam reveals no gallop.   No murmur heard. Pulmonary/Chest: Effort normal and breath sounds normal. No stridor. No respiratory distress. She has no decreased breath sounds. She has no wheezes. She has no rhonchi. She has no rales.  Abdominal: Soft. Normal appearance and bowel sounds are normal. She exhibits no distension. There is no tenderness. There is no rebound and no CVA tenderness.    Musculoskeletal: Normal range of motion. She exhibits no edema or tenderness.  Neurological: She is alert and oriented to person, place, and time. She has normal strength. No cranial nerve deficit or sensory deficit. GCS eye subscore is 4. GCS verbal subscore is 5. GCS motor subscore is 6.  Skin: Skin is warm and dry. No abrasion and no rash noted.  Psychiatric: Her affect is blunt. Her speech is delayed.  Nursing note and vitals reviewed.   ED Course  Procedures (including critical care time) Labs Review Labs Reviewed - No data to display  Imaging Review No results found. I have personally reviewed and evaluated these images and lab results as part of my medical decision-making.   EKG Interpretation None      MDM   Final  diagnoses:  Abdominal pain        Patient's G-tube replaced by radiology. Possible early cellulitis and will place on antibiotics.  Lorre NickAnthony Saamiya Jeppsen, MD 10/29/15 1310

## 2015-11-27 ENCOUNTER — Encounter: Payer: Self-pay | Admitting: Internal Medicine

## 2015-11-27 ENCOUNTER — Non-Acute Institutional Stay (SKILLED_NURSING_FACILITY): Payer: Medicare Other | Admitting: Internal Medicine

## 2015-11-27 DIAGNOSIS — F3175 Bipolar disorder, in partial remission, most recent episode depressed: Secondary | ICD-10-CM

## 2015-11-27 DIAGNOSIS — K5901 Slow transit constipation: Secondary | ICD-10-CM

## 2015-11-27 DIAGNOSIS — F0391 Unspecified dementia with behavioral disturbance: Secondary | ICD-10-CM | POA: Diagnosis not present

## 2015-11-27 DIAGNOSIS — F03918 Unspecified dementia, unspecified severity, with other behavioral disturbance: Secondary | ICD-10-CM

## 2015-11-27 NOTE — Assessment & Plan Note (Signed)
will continue seroquel 50 mg daily; cymbalta 60 mg daily; and depakote 500 mg twice daily and will monitor

## 2015-11-27 NOTE — Assessment & Plan Note (Signed)
no significant change in her status; will continue namzareic 28/10 mg daily and will monitor her status.

## 2015-11-27 NOTE — Progress Notes (Signed)
MRN: [id number] Name: [name]  Sex: female Age: 82 y.o. DOB: 1928-08-10  PSC #: Ronni Rumble Facility/Room:219 Level Of Care: SNF Provider: Merrilee Seashore D Emergency Contacts: Extended Emergency Contact Information Primary Emergency Contact: Epps,[name] Address: [address]           [place], [place] [address] [place] Home Phone: [phone] Relation: Son Secondary Emergency Contact: Mondragon,Hope Address: [address] [place] [address] [place] Home Phone: [phone] Relation: Daughter  Code Status:   Allergies: Review of patient's allergies indicates no known allergies.  Chief Complaint  Patient presents with  . Medical Management of Chronic Issues    HPI: Patient is 82 y.o. female with h/o COPD, HTN, bipolar disorder, dysphagia on tube feeding via peg, blindness, generalized frailty, dementia likely vascular in nature who is being seen for routine issues of dementia, bipolar dx and constipation.   Past Medical History  Diagnosis Date  . COPD (chronic obstructive pulmonary disease) (HCC)   . Hypertension   . Blind   . Hypothyroid   . Pacemaker   . Renal insufficiency   . Finger fracture   . Dementia   . Bipolar disorder (HCC)   . Dysphagia     needs thick nectar liquids  . Atrial fibrillation (HCC)   . Pneumonia   . Anxiety   . Diastolic heart failure [facility])     Past Surgical History  Procedure Laterality Date  . Insert / replace / remove pacemaker    . Tracheostomy  02/27/13    decannulated 03/29/13  . Gastrostomy tube placement  03/11/13      Medication List       This list is accurate as of: 11/27/15  9:57 PM.  Always use your most recent med list.               acetaminophen 500 MG tablet  Commonly known as:  TYLENOL  Take 2 tablets (1,000 mg total) by mouth every 8 (eight) hours.     beta carotene w/minerals tablet  Take 1 tablet by mouth daily.     cephALEXin 500 MG capsule   Commonly known as:  KEFLEX  Take 1 capsule (500 mg total) by mouth 2 (two) times daily.     docusate sodium 100 MG capsule  Commonly known as:  COLACE  Take 100 mg by mouth daily.     doxycycline 100 MG capsule  Commonly known as:  VIBRAMYCIN  Take 1 capsule (100 mg total) by mouth 2 (two) times daily.     DULoxetine 60 MG capsule  Commonly known as:  CYMBALTA  Take 60 mg by mouth daily.     famotidine 20 MG tablet  Commonly known as:  PEPCID  Take 1 tablet (20 mg total) by mouth 2 (two) times daily.     feeding supplement (OSMOLITE 1.5 CAL) Liqd  Place 60 mLs into feeding tube 2 (two) times daily.     furosemide 20 MG tablet  Commonly known as:  LASIX  Take 20 mg by mouth daily.     ipratropium-albuterol 0.5-2.5 (3) MG/3ML Soln  Commonly known as:  DUONEB  Take 3 mLs by nebulization every 4 (four) hours as needed.     levothyroxine 125 MCG tablet  Commonly known as:  SYNTHROID, LEVOTHROID  Take 125 mcg by mouth daily before breakfast.     lidocaine 5 %  Commonly known as:  LIDODERM  Place 1 patch onto the skin 2 (two) times daily. Apply 1/2 patch to each shoulder and to each knee for 12 hours, then remove     meloxicam 15 MG tablet  Commonly known as:  MOBIC  Take 15 mg by mouth daily.     multivitamin with minerals Tabs tablet  Take 1 tablet by mouth daily.     NAMZARIC 28-10 MG Cp24  Generic drug:  Memantine HCl-Donepezil HCl  Take 1 capsule by mouth daily.     oxyCODONE 5 MG immediate release tablet  Commonly known as:  Oxy IR/ROXICODONE  Take 5 mg by mouth every 4 (four) hours as needed (for pain).     oxyCODONE 5 MG immediate release tablet  Commonly known as:  Oxy IR/ROXICODONE  Take one tablet by mouth every 4 hours for pain     OXYGEN  Inhale 2 Imperial Gallon into the lungs continuous.     polyethylene glycol packet  Commonly known as:  MIRALAX / GLYCOLAX  Take 17 g by mouth daily.     QUEtiapine 50 MG tablet  Commonly known as:  SEROQUEL   Take 50 mg by mouth at bedtime.     SYSTANE BALANCE 0.6 % Soln  Generic drug:  Propylene Glycol  Apply 1 drop to eye 2 (two) times daily.     Valproic Acid 500 MG Cpdr  Take 500 mg by mouth 2 (two) times daily.        No orders of the defined types were placed in this encounter.    Immunization History  Administered Date(s) Administered  . Influenza-Unspecified 09/24/2012, 08/27/2014  . Pneumococcal-Unspecified 01/12/2015    Social History  Substance Use Topics  . Smoking status: Former Smoker -- .5 years    Types: Cigarettes  . Smokeless tobacco: Not on file  . Alcohol Use: Not on file    Review of Systems  DATA OBTAINED: from patient, nurse GENERAL:  no fevers, fatigue, appetite changes SKIN: No itching, rash HEENT: No complaint RESPIRATORY: No cough, wheezing, SOB CARDIAC: No chest pain, palpitations, lower extremity edema  GI: No abdominal pain, No N/V/D or constipation, No heartburn or reflux  GU: No dysuria, frequency or urgency, or incontinence  MUSCULOSKELETAL: No unrelieved bone/joint pain NEUROLOGIC: No headache, dizziness  PSYCHIATRIC: No overt anxiety or sadness  Filed Vitals:   11/27/15 1843  BP: 143/101  Pulse: 104  Temp: 95.6 F (35.3 C)  Resp: 18    Physical Exam  GENERAL APPEARANCE: Alert, No acute distress , listening to a sermon SKIN: No diaphoresis rash HEENT: blind RESPIRATORY: Breathing is even, unlabored. Lung sounds are clear   CARDIOVASCULAR: Heart RRR no murmurs, rubs or gallops. No peripheral edema  GASTROINTESTINAL: Abdomen is soft, non-tender, not distended w/ normal bowel sounds.  GENITOURINARY: Bladder non tender, not distended  MUSCULOSKELETAL: No abnormal joints or musculature NEUROLOGIC: Cranial nerves 2-12 grossly intact. Moves all extremities PSYCHIATRIC: Mood and affect appropriate to situation with dementia, no behavioral issues  Patient Active Problem List   Diagnosis Date Noted  . Chronic diastolic heart  failure (HCC) 98/11/914712/16/2015  . Emphysema of lung (HCC) 09/17/2014  . Hypothyroidism due to acquired atrophy of thyroid 09/17/2014  . FTT (failure to thrive) in adult 07/13/2014  . UTI (urinary tract infection) 07/13/2014  . PEG (percutaneous endoscopic gastrostomy) status (HCC) 07/13/2014  . Bipolar affective disorder (HCC) 02/15/2013  . Tardive dyskinesia 02/15/2013  . Essential hypertension, benign 02/15/2013  . COPD (chronic obstructive pulmonary disease) (HCC) 02/15/2013  .  Constipation 02/15/2013  . Weight loss 02/15/2013  . Dysphagia 02/15/2013  . Chronic pain 02/15/2013  . Dementia with behavioral disturbance 08/16/2012  . Pacemaker 08/16/2012    CBC    Component Value Date/Time   WBC 9.8 09/22/2015 1828   RBC 3.25* 09/22/2015 1828   HGB 10.7* 09/22/2015 1828   HCT 34.0* 09/22/2015 1828   PLT 179 09/22/2015 1828   MCV 104.6* 09/22/2015 1828   LYMPHSABS 1.5 09/22/2015 1828   MONOABS 0.9 09/22/2015 1828   EOSABS 0.6 09/22/2015 1828   BASOSABS 0.0 09/22/2015 1828    CMP     Component Value Date/Time   NA 145 09/22/2015 1828   K 4.7 09/22/2015 1828   CL 102 09/22/2015 1828   CO2 36* 09/22/2015 1828   GLUCOSE 86 09/22/2015 1828   BUN 44* 09/22/2015 1828   CREATININE 0.90 09/22/2015 1828   CALCIUM 8.8* 09/22/2015 1828   PROT 6.8 06/30/2014 0614   ALBUMIN 2.6* 06/30/2014 0614   AST 12 06/30/2014 0614   ALT 7 06/30/2014 0614   ALKPHOS 92 06/30/2014 0614   BILITOT <0.2* 06/30/2014 0614   GFRNONAA 57* 09/22/2015 1828   GFRAA >60 09/22/2015 1828    Assessment and Plan  Bipolar affective disorder will continue seroquel 50 mg daily; cymbalta 60 mg daily; and depakote 500 mg twice daily and will monitor    Dementia with behavioral disturbance no significant change in her status; will continue namzareic 28/10 mg daily and will monitor her status.   Constipation Chronic and stable ;will continue miralax daily and colace twice daily     Margit Hanks,  MD

## 2015-11-27 NOTE — Assessment & Plan Note (Signed)
Chronic and stable ;will continue miralax daily and colace twice daily

## 2015-12-01 LAB — HM DIABETES FOOT EXAM

## 2015-12-29 ENCOUNTER — Encounter: Payer: Self-pay | Admitting: Adult Health

## 2015-12-29 ENCOUNTER — Non-Acute Institutional Stay (SKILLED_NURSING_FACILITY): Payer: Medicare Other | Admitting: Adult Health

## 2015-12-29 DIAGNOSIS — Z931 Gastrostomy status: Secondary | ICD-10-CM

## 2015-12-29 DIAGNOSIS — E034 Atrophy of thyroid (acquired): Secondary | ICD-10-CM

## 2015-12-29 DIAGNOSIS — F3175 Bipolar disorder, in partial remission, most recent episode depressed: Secondary | ICD-10-CM

## 2015-12-29 DIAGNOSIS — J449 Chronic obstructive pulmonary disease, unspecified: Secondary | ICD-10-CM | POA: Diagnosis not present

## 2015-12-29 DIAGNOSIS — R1314 Dysphagia, pharyngoesophageal phase: Secondary | ICD-10-CM | POA: Diagnosis not present

## 2015-12-29 DIAGNOSIS — R627 Adult failure to thrive: Secondary | ICD-10-CM | POA: Diagnosis not present

## 2015-12-29 DIAGNOSIS — F0391 Unspecified dementia with behavioral disturbance: Secondary | ICD-10-CM | POA: Diagnosis not present

## 2015-12-29 DIAGNOSIS — I5032 Chronic diastolic (congestive) heart failure: Secondary | ICD-10-CM | POA: Diagnosis not present

## 2015-12-29 DIAGNOSIS — G8929 Other chronic pain: Secondary | ICD-10-CM

## 2015-12-29 DIAGNOSIS — Z950 Presence of cardiac pacemaker: Secondary | ICD-10-CM

## 2015-12-29 DIAGNOSIS — I10 Essential (primary) hypertension: Secondary | ICD-10-CM | POA: Diagnosis not present

## 2015-12-29 DIAGNOSIS — E038 Other specified hypothyroidism: Secondary | ICD-10-CM

## 2015-12-29 DIAGNOSIS — F03918 Unspecified dementia, unspecified severity, with other behavioral disturbance: Secondary | ICD-10-CM

## 2015-12-29 NOTE — Progress Notes (Signed)
Patient ID: [name], female   DOB: 1928-05-17, 82 y.o.   MRN: [id number]   [facility]ility[facility]       No Known Allergies  Chief Complaint  Patient presents with  . Medical Management of Chronic Issues    Follow up    HPI:  She is a long term resident of this facility being seen for the management of her chronic illnesses. Overall there is little change in her status. She is unable to fully participate in the hpi or ros; but did tell me that she was feeling "ok". There are no nursing concerns at this time. She was seen at the pain clinic; however; that appointment did not go well and she will not be returning.    Past Medical History  Diagnosis Date  . COPD (chronic obstructive pulmonary disease) (HCC)   . Hypertension   . Blind   . Hypothyroid   . Pacemaker   . Renal insufficiency   . Finger fracture   . Dementia   . Bipolar disorder (HCC)   . Dysphagia     needs thick nectar liquids  . Atrial fibrillation (HCC)   . Pneumonia   . Anxiety   . Diastolic heart failure [facility])     Past Surgical History  Procedure Laterality Date  . Insert / replace / remove pacemaker    . Tracheostomy  02/27/13    decannulated 03/29/13  . Gastrostomy tube placement  03/11/13    VITAL SIGNS BP 128/78 mmHg  Pulse 68  Temp(Src) 97.6 F (36.4 C) (Oral)  Resp 17  Ht  (1.651 m)  Wt 156 lb (70.761 kg)  BMI 25.96 kg/m2  SpO2 94%  Patient's Medications  New Prescriptions   No medications on file  Previous Medications   BETA CAROTENE W/MINERALS (OCUVITE) TABLET    Take 1 tablet by mouth daily.   DOCUSATE SODIUM (COLACE) 100 MG CAPSULE    Take 100 mg by mouth daily.   DULOXETINE (CYMBALTA) 60 MG CAPSULE    Take 60 mg by mouth daily.   FAMOTIDINE (PEPCID) 20 MG TABLET    Take 1 tablet (20 mg total) by mouth 2 (two) times daily.   FUROSEMIDE (LASIX) 20 MG TABLET    Take 20 mg by mouth daily.   IPRATROPIUM-ALBUTEROL (DUONEB) 0.5-2.5 (3) MG/3ML SOLN    Take 3 mLs by nebulization  3 (three) times daily.   LEVOTHYROXINE (SYNTHROID, LEVOTHROID) 125 MCG TABLET    Take 125 mcg by mouth daily before breakfast.   LIDOCAINE (LIDODERM) 5 %    Place 1 patch onto the skin 2 (two) times daily. Apply 1/2 patch to each shoulder and to each knee for 12 hours, then remove   MELOXICAM (MOBIC) 15 MG TABLET    Take 15 mg by mouth daily.   MEMANTINE HCL-DONEPEZIL HCL (NAMZARIC) 28-10 MG CP24    Take 1 capsule by mouth daily.   MULTIPLE VITAMIN (MULTIVITAMIN WITH MINERALS) TABS TABLET    Take 1 tablet by mouth daily.   NUTRITIONAL SUPPLEMENTS (FEEDING SUPPLEMENT, OSMOLITE 1.5 CAL,) LIQD    Place 60 mLs into feeding tube 2 (two) times daily.    OXYCODONE (OXY IR/ROXICODONE) 5 MG IMMEDIATE RELEASE TABLET    Take one tablet by mouth every 4 hours for pain   OXYGEN    2L/ [place]   POLYETHYLENE GLYCOL (MIRALAX / GLYCOLAX) PACKET    Take 17 g by mouth daily.   PROPYLENE GLYCOL (SYSTANE BALANCE) 0.6 % SOLN  Apply 1 drop to eye 2 (two) times daily.   QUETIAPINE (SEROQUEL) 50 MG TABLET    Take 50 mg by mouth at bedtime.   VALPROIC ACID 500 MG CPDR    Take 500 mg by mouth 2 (two) times daily.  Modified Medications   No medications on file  Discontinued Medications     SIGNIFICANT DIAGNOSTIC EXAMS  She is not a candidate for dexa scan; mammogram; colonoscopy   LABS REVIEWED:   10-18-14: wbc 7.9; hgb 12.1; hct 39.7 ;mcv 102.6;plt 201; glucose 88; bun 29.9; creat 0.51; k+4.3; na++140; liver normal albumin 3.1; tsh 1.53; pre-albumin 19; depakote 31 10-27-14: hgb a1c 5.4  02-13-15: tsh 1.23; hgb a1c 5.3  04-08-15: glucose 120; bun 38.9; creat 0.76; k+4.6; na++ 147; liver normal albumin 3.3  06-10-15: wbc 7.8 ;hgb 11.7; hct 39.1; mcv 106.9; plt 170; glucose 120; bun 38.9; creat 0.78; k+ 4.6; na++147; liver normal albumin 3.3; tsh 1.23; hgb a1c 5.3 depakote 50 08-19-15: glucose 87; bun 41.0; creat 0.72; k+ 4.9; na++139; liver normal albumin 2.9; tsh 2.90;  free t4: 1.01    Review of Systems Unable to  perform ROS: Dementia     Physical Exam Constitutional: No distress.  Frail  Neck: Neck supple. No JVD present. No thyromegaly present.  Cardiovascular: Normal rate, regular rhythm and intact distal pulses.   Respiratory: Effort normal and breath sounds normal. No respiratory distress.  GI: Soft. Bowel sounds are normal. She exhibits no distension. There is no tenderness.  Has peg tube  Musculoskeletal: She exhibits no edema.  Is able to move extremities; has generalized weakness present   Neurological: She is alert.  Skin: Skin is warm and dry. She is not diaphoretic.      ASSESSMENT/ PLAN:  1. Dysphagia: she is on nectar thick liquids and is dependent upon  peg tube feedings. There are no signs of aspiration present. Will not make changes will monitor her status.   2.  COPD: no change in her status: does require 02. Will continue duoneb three times daily will monitor her status.   3. Hypothyroidism; her tsh is 1.23; will continue synthroid 125 mcg daily   4. Diastolic heart failure: will continue lasix 20 mg daily and will monitor does have a pacemaker   5. Dementia with behavioral disturbance: no significant change in her status; will continue namzareic 28/10 mg daily and will monitor her status.   6. Bipolar affective disorder: will continue seroquel 50 mg daily; cymbalta 60 mg daily; and depakote 500 mg twice daily and will monitor   7. Constipation: will continue miralax daily and colace twice daily   8. Gerd: will continue pepcid 20 mg twice daily   9. Chronic pain in multiple joints:  will continue mobic 15 mg daily; oxycodone 5 mg every 4 hours routinely  will  Continue lidoderm patch 1/2 patch to each shoulder and knee daily; will continue cymbalta 60 mg daily and will monitor   She will not be returning to the pain clinic    Will check cbc; cmp; hgb a1c depakote level      Synthia Innocent NP [facility]  Contact [phone] Monday through Friday  8am- 5pm  After hours call [phone]

## 2015-12-30 LAB — HEPATIC FUNCTION PANEL
ALK PHOS: 94 U/L (ref 25–125)
ALT: 6 U/L — AB (ref 7–35)
AST: 13 U/L (ref 13–35)

## 2015-12-30 LAB — CBC AND DIFFERENTIAL
HEMATOCRIT: 36 % (ref 36–46)
Hemoglobin: 11.2 g/dL — AB (ref 12.0–16.0)
Platelets: 220 10*3/uL (ref 150–399)
WBC: 6.2 10^3/mL

## 2015-12-30 LAB — BASIC METABOLIC PANEL
BUN: 44 mg/dL — AB (ref 4–21)
CREATININE: 0.9 mg/dL (ref 0.5–1.1)
Glucose: 107 mg/dL
Potassium: 4.3 mmol/L (ref 3.4–5.3)
SODIUM: 146 mmol/L (ref 137–147)

## 2015-12-30 LAB — HEMOGLOBIN A1C: Hemoglobin A1C: 5.2

## 2016-01-14 ENCOUNTER — Ambulatory Visit (HOSPITAL_COMMUNITY)
Admission: RE | Admit: 2016-01-14 | Discharge: 2016-01-14 | Disposition: A | Discharge status: Home / Self Care | Payer: Medicare Other | Source: Ambulatory Visit | Attending: Interventional Radiology | Admitting: Interventional Radiology

## 2016-01-14 ENCOUNTER — Other Ambulatory Visit (HOSPITAL_COMMUNITY): Payer: Self-pay | Admitting: Interventional Radiology

## 2016-01-14 DIAGNOSIS — R1310 Dysphagia, unspecified: Secondary | ICD-10-CM | POA: Insufficient documentation

## 2016-01-14 DIAGNOSIS — Z431 Encounter for attention to gastrostomy: Secondary | ICD-10-CM | POA: Diagnosis present

## 2016-01-14 DIAGNOSIS — R6251 Failure to thrive (child): Secondary | ICD-10-CM

## 2016-01-14 MED ORDER — IOHEXOL 300 MG/ML  SOLN
50.0000 mL | Freq: Once | INTRAMUSCULAR | Status: AC | PRN
  Administered 2016-01-14: 5 mL via INTRAVENOUS

## 2016-01-14 MED ORDER — LIDOCAINE VISCOUS 2 % MT SOLN
OROMUCOSAL | Status: AC
  Filled 2016-01-14: qty 15

## 2016-01-14 NOTE — Procedures (Signed)
Successful fluoroscopic guided replacement of 20 Fr gastrostomy tube.  No immediate post procedural complications.  The feeding tube is ready for immediate use.  Jay Adetokunbo Mccadden, MD Pager #: [phone]  

## 2016-02-01 LAB — VALPROIC ACID LEVEL: VALPROIC ACID: 53

## 2016-02-03 ENCOUNTER — Encounter: Payer: Self-pay | Admitting: Adult Health

## 2016-02-03 ENCOUNTER — Non-Acute Institutional Stay (SKILLED_NURSING_FACILITY): Payer: Medicare Other | Admitting: Adult Health

## 2016-02-03 DIAGNOSIS — R1314 Dysphagia, pharyngoesophageal phase: Secondary | ICD-10-CM

## 2016-02-03 DIAGNOSIS — F0391 Unspecified dementia with behavioral disturbance: Secondary | ICD-10-CM | POA: Diagnosis not present

## 2016-02-03 DIAGNOSIS — K5901 Slow transit constipation: Secondary | ICD-10-CM

## 2016-02-03 DIAGNOSIS — E038 Other specified hypothyroidism: Secondary | ICD-10-CM

## 2016-02-03 DIAGNOSIS — F03918 Unspecified dementia, unspecified severity, with other behavioral disturbance: Secondary | ICD-10-CM

## 2016-02-03 DIAGNOSIS — Z931 Gastrostomy status: Secondary | ICD-10-CM | POA: Diagnosis not present

## 2016-02-03 DIAGNOSIS — J439 Emphysema, unspecified: Secondary | ICD-10-CM

## 2016-02-03 DIAGNOSIS — Z950 Presence of cardiac pacemaker: Secondary | ICD-10-CM | POA: Diagnosis not present

## 2016-02-03 DIAGNOSIS — F3175 Bipolar disorder, in partial remission, most recent episode depressed: Secondary | ICD-10-CM

## 2016-02-03 DIAGNOSIS — E034 Atrophy of thyroid (acquired): Secondary | ICD-10-CM

## 2016-02-03 DIAGNOSIS — I10 Essential (primary) hypertension: Secondary | ICD-10-CM

## 2016-02-03 DIAGNOSIS — I5032 Chronic diastolic (congestive) heart failure: Secondary | ICD-10-CM | POA: Diagnosis not present

## 2016-02-03 DIAGNOSIS — G8929 Other chronic pain: Secondary | ICD-10-CM

## 2016-02-03 NOTE — Progress Notes (Signed)
Patient ID: [name], female   DOB: 05/18/1928, 82 y.o.   MRN: [id number]   Facility:  [facility]       No Known Allergies  Chief Complaint  Patient presents with  . Medical Management of Chronic Issues    Follow up    HPI:  She is a long term resident of this facility being seen for the management of her chronic illnesses. Overall there is little change in her status. She is unable to fully participate in the hpi or ros. Her current weight is 152 pounds; she is dependent upon tube feeding for her nutritional support. There are no nursing concerns at this time.    Past Medical History  Diagnosis Date  . COPD (chronic obstructive pulmonary disease) (HCC)   . Hypertension   . Blind   . Hypothyroid   . Pacemaker   . Renal insufficiency   . Finger fracture   . Dementia   . Bipolar disorder (HCC)   . Dysphagia     needs thick nectar liquids  . Atrial fibrillation (HCC)   . Pneumonia   . Anxiety   . Diastolic heart failure [facility](HCC)     Past Surgical History  Procedure Laterality Date  . Insert / replace / remove pacemaker    . Tracheostomy  02/27/13    decannulated 03/29/13  . Gastrostomy tube placement  03/11/13    VITAL SIGNS BP 132/67 mmHg  Pulse 65  Temp(Src) 96.8 F (36 C) (Oral)  Resp 18  Ht 5\' 5"  (1.651 m)  Wt 152 lb (68.947 kg)  BMI 25.29 kg/m2  SpO2 94%  Patient's Medications  New Prescriptions   No medications on file  Previous Medications   BETA CAROTENE W/MINERALS (OCUVITE) TABLET    Take 1 tablet by mouth daily.   DOCUSATE SODIUM (COLACE) 100 MG CAPSULE    Take 100 mg by mouth daily.   DULOXETINE (CYMBALTA) 60 MG CAPSULE    Take 60 mg by mouth daily.   FAMOTIDINE (PEPCID) 20 MG TABLET    Take 1 tablet (20 mg total) by mouth 2 (two) times daily.   FUROSEMIDE (LASIX) 20 MG TABLET    Take 20 mg by mouth daily.   IPRATROPIUM-ALBUTEROL (DUONEB) 0.5-2.5 (3) MG/3ML SOLN    Take 3 mLs by nebulization 3 (three) times daily.   LEVOTHYROXINE (SYNTHROID,  LEVOTHROID) 125 MCG TABLET    Take 125 mcg by mouth daily before breakfast.   LIDOCAINE (LIDODERM) 5 %    Place 1 patch onto the skin 2 (two) times daily. Apply 1/2 patch to each shoulder and to each knee for 12 hours, then remove   MELOXICAM (MOBIC) 15 MG TABLET    Take 15 mg by mouth daily.   MEMANTINE HCL-DONEPEZIL HCL (NAMZARIC) 28-10 MG CP24    Take 1 capsule by mouth daily.   MULTIPLE VITAMIN (MULTIVITAMIN WITH MINERALS) TABS TABLET    Take 1 tablet by mouth daily.   NUTRITIONAL SUPPLEMENTS (FEEDING SUPPLEMENT, OSMOLITE 1.5 CAL,) LIQD    Place 60 mLs into feeding tube 2 (two) times daily.    OXYCODONE (OXY IR/ROXICODONE) 5 MG IMMEDIATE RELEASE TABLET    Take one tablet by mouth every 4 hours for pain   OXYGEN    Inhale 2 Imperial Gallon into the lungs continuous.   POLYETHYLENE GLYCOL (MIRALAX / GLYCOLAX) PACKET    Take 17 g by mouth daily.   PROPYLENE GLYCOL (SYSTANE BALANCE) 0.6 % SOLN    Apply 1 drop to eye  2 (two) times daily.   QUETIAPINE (SEROQUEL) 50 MG TABLET    Take 50 mg by mouth at bedtime.   UNABLE TO FIND    Med Name: HSG Mech soft texture, Nectar consistency for diet order.   VALPROIC ACID 500 MG CPDR    Take 500 mg by mouth 2 (two) times daily.  Modified Medications   No medications on file  Discontinued Medications   No medications on file     SIGNIFICANT DIAGNOSTIC EXAMS  She is not a candidate for dexa scan; mammogram; colonoscopy   LABS REVIEWED:   02-13-15: tsh 1.23; hgb a1c 5.3  04-08-15: glucose 120; bun 38.9; creat 0.76; k+4.6; na++ 147; liver normal albumin 3.3  06-10-15: wbc 7.8 ;hgb 11.7; hct 39.1; mcv 106.9; plt 170; glucose 120; bun 38.9; creat 0.78; k+ 4.6; na++147; liver normal albumin 3.3; tsh 1.23; hgb a1c 5.3 depakote 50 08-19-15: glucose 87; bun 41.0; creat 0.72; k+ 4.9; na++139; liver normal albumin 2.9; tsh 2.90;  free t4: 1.01 12-30-15: wbc 6.2; hgb 11.2; hct 36.0; mcv 101.4; plt 220 glucose 107; bun 43.8; creat 0.86; k+ 4.3; na++ 146; liver normal  albumin 3.2 hgb a1c 5.2; depakote 53     Review of Systems Unable to perform ROS: Dementia     Physical Exam Constitutional: No distress.  Frail  Neck: Neck supple. No JVD present. No thyromegaly present.  Cardiovascular: Normal rate, regular rhythm and intact distal pulses.   Respiratory: Effort normal and breath sounds normal. No respiratory distress.  GI: Soft. Bowel sounds are normal. She exhibits no distension. There is no tenderness.  Has peg tube  Musculoskeletal: She exhibits no edema.  Is able to move extremities; has generalized weakness present   Neurological: She is alert.  Skin: Skin is warm and dry. She is not diaphoretic.      ASSESSMENT/ PLAN:  1. Dysphagia: she is on nectar thick liquids and is dependent upon  peg tube feedings. There are no signs of aspiration present. Will not make changes will monitor her status.   2.  COPD: no change in her status: does require 02. Will continue duoneb three times daily will monitor her status.   3. Hypothyroidism; her tsh is 1.23; will continue synthroid 125 mcg daily   4. Diastolic heart failure: will continue lasix 20 mg daily and will monitor does have a pacemaker   5. Dementia with behavioral disturbance: no significant change in her status; will continue namzareic 28/10 mg daily and will monitor her status.  Her current weight is 152 pounds   6. Bipolar affective disorder: will continue seroquel 50 mg daily; cymbalta 60 mg daily; and depakote 500 mg twice daily and will monitor   7. Constipation: will continue miralax daily and colace twice daily   8. Gerd: will continue pepcid 20 mg twice daily   9. Chronic pain in multiple joints:  will continue mobic 15 mg daily; oxycodone 5 mg every 4 hours routinely  will  Continue lidoderm patch 1/2 patch to each shoulder and knee daily; will continue cymbalta 60 mg daily and will monitor   She will not be returning to the pain clinic       Synthia Innocent NP [facility]  Adult Medicine  Contact [phone] Monday through Friday 8am- 5pm  After hours call [phone]

## 2016-02-12 ENCOUNTER — Inpatient Hospital Stay (HOSPITAL_COMMUNITY)
Admission: EM | Admit: 2016-02-12 | Discharge: 2016-02-18 | DRG: 871 | Disposition: A | Discharge status: Skilled Nursing Facility | Payer: Medicare Other | Attending: Internal Medicine | Admitting: Internal Medicine

## 2016-02-12 ENCOUNTER — Encounter (HOSPITAL_COMMUNITY): Payer: Self-pay | Admitting: Emergency Medicine

## 2016-02-12 ENCOUNTER — Emergency Department (HOSPITAL_COMMUNITY): Payer: Medicare Other

## 2016-02-12 ENCOUNTER — Inpatient Hospital Stay (HOSPITAL_COMMUNITY): Payer: Medicare Other

## 2016-02-12 DIAGNOSIS — Z931 Gastrostomy status: Secondary | ICD-10-CM

## 2016-02-12 DIAGNOSIS — D6489 Other specified anemias: Secondary | ICD-10-CM | POA: Diagnosis present

## 2016-02-12 DIAGNOSIS — G9341 Metabolic encephalopathy: Secondary | ICD-10-CM | POA: Diagnosis present

## 2016-02-12 DIAGNOSIS — F0391 Unspecified dementia with behavioral disturbance: Secondary | ICD-10-CM | POA: Diagnosis present

## 2016-02-12 DIAGNOSIS — H540 Blindness, both eyes: Secondary | ICD-10-CM | POA: Diagnosis present

## 2016-02-12 DIAGNOSIS — Y95 Nosocomial condition: Secondary | ICD-10-CM | POA: Diagnosis present

## 2016-02-12 DIAGNOSIS — N170 Acute kidney failure with tubular necrosis: Secondary | ICD-10-CM | POA: Diagnosis present

## 2016-02-12 DIAGNOSIS — Z7189 Other specified counseling: Secondary | ICD-10-CM | POA: Insufficient documentation

## 2016-02-12 DIAGNOSIS — R32 Unspecified urinary incontinence: Secondary | ICD-10-CM | POA: Diagnosis not present

## 2016-02-12 DIAGNOSIS — R627 Adult failure to thrive: Secondary | ICD-10-CM | POA: Diagnosis present

## 2016-02-12 DIAGNOSIS — Z791 Long term (current) use of non-steroidal anti-inflammatories (NSAID): Secondary | ICD-10-CM

## 2016-02-12 DIAGNOSIS — Z950 Presence of cardiac pacemaker: Secondary | ICD-10-CM | POA: Diagnosis not present

## 2016-02-12 DIAGNOSIS — E872 Acidosis: Secondary | ICD-10-CM | POA: Diagnosis present

## 2016-02-12 DIAGNOSIS — E878 Other disorders of electrolyte and fluid balance, not elsewhere classified: Secondary | ICD-10-CM | POA: Diagnosis not present

## 2016-02-12 DIAGNOSIS — Z87891 Personal history of nicotine dependence: Secondary | ICD-10-CM | POA: Diagnosis not present

## 2016-02-12 DIAGNOSIS — E876 Hypokalemia: Secondary | ICD-10-CM | POA: Diagnosis not present

## 2016-02-12 DIAGNOSIS — F03918 Unspecified dementia, unspecified severity, with other behavioral disturbance: Secondary | ICD-10-CM | POA: Diagnosis present

## 2016-02-12 DIAGNOSIS — E870 Hyperosmolality and hypernatremia: Secondary | ICD-10-CM | POA: Diagnosis present

## 2016-02-12 DIAGNOSIS — I110 Hypertensive heart disease with heart failure: Secondary | ICD-10-CM | POA: Diagnosis present

## 2016-02-12 DIAGNOSIS — J690 Pneumonitis due to inhalation of food and vomit: Secondary | ICD-10-CM | POA: Diagnosis present

## 2016-02-12 DIAGNOSIS — R1314 Dysphagia, pharyngoesophageal phase: Secondary | ICD-10-CM | POA: Diagnosis present

## 2016-02-12 DIAGNOSIS — I9589 Other hypotension: Secondary | ICD-10-CM

## 2016-02-12 DIAGNOSIS — I959 Hypotension, unspecified: Secondary | ICD-10-CM | POA: Insufficient documentation

## 2016-02-12 DIAGNOSIS — J189 Pneumonia, unspecified organism: Secondary | ICD-10-CM | POA: Diagnosis present

## 2016-02-12 DIAGNOSIS — R7989 Other specified abnormal findings of blood chemistry: Secondary | ICD-10-CM

## 2016-02-12 DIAGNOSIS — N390 Urinary tract infection, site not specified: Secondary | ICD-10-CM | POA: Diagnosis present

## 2016-02-12 DIAGNOSIS — I4891 Unspecified atrial fibrillation: Secondary | ICD-10-CM | POA: Diagnosis present

## 2016-02-12 DIAGNOSIS — G8929 Other chronic pain: Secondary | ICD-10-CM | POA: Diagnosis present

## 2016-02-12 DIAGNOSIS — A419 Sepsis, unspecified organism: Principal | ICD-10-CM | POA: Diagnosis present

## 2016-02-12 DIAGNOSIS — G2401 Drug induced subacute dyskinesia: Secondary | ICD-10-CM | POA: Diagnosis present

## 2016-02-12 DIAGNOSIS — E034 Atrophy of thyroid (acquired): Secondary | ICD-10-CM | POA: Diagnosis present

## 2016-02-12 DIAGNOSIS — N179 Acute kidney failure, unspecified: Secondary | ICD-10-CM | POA: Diagnosis not present

## 2016-02-12 DIAGNOSIS — R4182 Altered mental status, unspecified: Secondary | ICD-10-CM | POA: Diagnosis present

## 2016-02-12 DIAGNOSIS — F319 Bipolar disorder, unspecified: Secondary | ICD-10-CM | POA: Diagnosis present

## 2016-02-12 DIAGNOSIS — I5032 Chronic diastolic (congestive) heart failure: Secondary | ICD-10-CM | POA: Diagnosis present

## 2016-02-12 DIAGNOSIS — R0602 Shortness of breath: Secondary | ICD-10-CM

## 2016-02-12 DIAGNOSIS — I248 Other forms of acute ischemic heart disease: Secondary | ICD-10-CM | POA: Diagnosis present

## 2016-02-12 DIAGNOSIS — Z515 Encounter for palliative care: Secondary | ICD-10-CM | POA: Insufficient documentation

## 2016-02-12 DIAGNOSIS — R6521 Severe sepsis with septic shock: Secondary | ICD-10-CM | POA: Diagnosis not present

## 2016-02-12 DIAGNOSIS — J9600 Acute respiratory failure, unspecified whether with hypoxia or hypercapnia: Secondary | ICD-10-CM

## 2016-02-12 DIAGNOSIS — J449 Chronic obstructive pulmonary disease, unspecified: Secondary | ICD-10-CM | POA: Diagnosis not present

## 2016-02-12 DIAGNOSIS — H9190 Unspecified hearing loss, unspecified ear: Secondary | ICD-10-CM | POA: Diagnosis present

## 2016-02-12 DIAGNOSIS — Z7401 Bed confinement status: Secondary | ICD-10-CM

## 2016-02-12 DIAGNOSIS — I5033 Acute on chronic diastolic (congestive) heart failure: Secondary | ICD-10-CM | POA: Diagnosis present

## 2016-02-12 DIAGNOSIS — I10 Essential (primary) hypertension: Secondary | ICD-10-CM | POA: Diagnosis present

## 2016-02-12 DIAGNOSIS — Z9981 Dependence on supplemental oxygen: Secondary | ICD-10-CM

## 2016-02-12 DIAGNOSIS — R5381 Other malaise: Secondary | ICD-10-CM | POA: Diagnosis present

## 2016-02-12 DIAGNOSIS — R778 Other specified abnormalities of plasma proteins: Secondary | ICD-10-CM | POA: Diagnosis present

## 2016-02-12 DIAGNOSIS — F3175 Bipolar disorder, in partial remission, most recent episode depressed: Secondary | ICD-10-CM | POA: Diagnosis not present

## 2016-02-12 LAB — I-STAT CG4 LACTIC ACID, ED: Lactic Acid, Venous: 5.11 mmol/L (ref 0.5–2.0)

## 2016-02-12 LAB — COMPREHENSIVE METABOLIC PANEL
ALT: 12 U/L — ABNORMAL LOW (ref 14–54)
AST: 27 U/L (ref 15–41)
Albumin: 2.8 g/dL — ABNORMAL LOW (ref 3.5–5.0)
Alkaline Phosphatase: 79 U/L (ref 38–126)
Anion gap: 16 — ABNORMAL HIGH (ref 5–15)
BILIRUBIN TOTAL: 0.5 mg/dL (ref 0.3–1.2)
BUN: 50 mg/dL — AB (ref 6–20)
CO2: 26 mmol/L (ref 22–32)
CREATININE: 1.4 mg/dL — AB (ref 0.44–1.00)
Calcium: 8.9 mg/dL (ref 8.9–10.3)
Chloride: 106 mmol/L (ref 101–111)
GFR calc Af Amer: 39 mL/min — ABNORMAL LOW (ref >60)
GFR, EST NON AFRICAN AMERICAN: 33 mL/min — AB (ref >60)
Glucose, Bld: 163 mg/dL — ABNORMAL HIGH (ref 65–99)
POTASSIUM: 4.6 mmol/L (ref 3.5–5.1)
Sodium: 148 mmol/L — ABNORMAL HIGH (ref 135–145)
TOTAL PROTEIN: 7 g/dL (ref 6.5–8.1)

## 2016-02-12 LAB — PROCALCITONIN
PROCALCITONIN: 0.32 ng/mL
Procalcitonin: 0.24 ng/mL

## 2016-02-12 LAB — CBC WITH DIFFERENTIAL/PLATELET
BASOS ABS: 0 10*3/uL (ref 0.0–0.1)
Basophils Relative: 0 %
Eosinophils Absolute: 0 10*3/uL (ref 0.0–0.7)
Eosinophils Relative: 0 %
HEMATOCRIT: 44.2 % (ref 36.0–46.0)
Hemoglobin: 13.6 g/dL (ref 12.0–15.0)
LYMPHS PCT: 13 %
Lymphs Abs: 1.3 10*3/uL (ref 0.7–4.0)
MCH: 32.9 pg (ref 26.0–34.0)
MCHC: 30.8 g/dL (ref 30.0–36.0)
MCV: 107 fL — AB (ref 78.0–100.0)
MONO ABS: 1 10*3/uL (ref 0.1–1.0)
MONOS PCT: 10 %
NEUTROS ABS: 7.7 10*3/uL (ref 1.7–7.7)
Neutrophils Relative %: 77 %
Platelets: 205 10*3/uL (ref 150–400)
RBC: 4.13 MIL/uL (ref 3.87–5.11)
RDW: 14.7 % (ref 11.5–15.5)
WBC: 10 10*3/uL (ref 4.0–10.5)

## 2016-02-12 LAB — I-STAT ARTERIAL BLOOD GAS, ED
BICARBONATE: 25.1 meq/L — AB (ref 20.0–24.0)
O2 Saturation: 100 %
PCO2 ART: 40.4 mmHg (ref 35.0–45.0)
PH ART: 7.4 (ref 7.350–7.450)
PO2 ART: 170 mmHg — AB (ref 80.0–100.0)
TCO2: 26 mmol/L (ref 0–100)

## 2016-02-12 LAB — BRAIN NATRIURETIC PEPTIDE: B Natriuretic Peptide: 773.6 pg/mL — ABNORMAL HIGH (ref 0.0–100.0)

## 2016-02-12 LAB — URINALYSIS, ROUTINE W REFLEX MICROSCOPIC
BILIRUBIN URINE: NEGATIVE
Glucose, UA: NEGATIVE mg/dL
KETONES UR: 15 mg/dL — AB
Nitrite: NEGATIVE
PROTEIN: 100 mg/dL — AB
Specific Gravity, Urine: 1.017 (ref 1.005–1.030)
pH: 8.5 — ABNORMAL HIGH (ref 5.0–8.0)

## 2016-02-12 LAB — I-STAT TROPONIN, ED: TROPONIN I, POC: 0.45 ng/mL — AB (ref 0.00–0.08)

## 2016-02-12 LAB — PROTIME-INR
INR: 1.12 (ref 0.00–1.49)
PROTHROMBIN TIME: 14.6 s (ref 11.6–15.2)

## 2016-02-12 LAB — URINE MICROSCOPIC-ADD ON

## 2016-02-12 LAB — CG4 I-STAT (LACTIC ACID): Lactic Acid, Venous: 1.87 mmol/L (ref 0.5–2.0)

## 2016-02-12 LAB — CBG MONITORING, ED: GLUCOSE-CAPILLARY: 155 mg/dL — AB (ref 65–99)

## 2016-02-12 LAB — TROPONIN I: TROPONIN I: 1.85 ng/mL — AB (ref <0.031)

## 2016-02-12 LAB — APTT: aPTT: 31 seconds (ref 24–37)

## 2016-02-12 LAB — CORTISOL: Cortisol, Plasma: 22.1 ug/dL

## 2016-02-12 MED ORDER — PHENYLEPHRINE HCL 10 MG/ML IJ SOLN
0.0000 ug/min | INTRAMUSCULAR | Status: DC
  Administered 2016-02-12 – 2016-02-13 (×2): 20 ug/min via INTRAVENOUS
  Filled 2016-02-12 (×4): qty 1

## 2016-02-12 MED ORDER — INSULIN ASPART 100 UNIT/ML ~~LOC~~ SOLN
0.0000 [IU] | SUBCUTANEOUS | Status: DC
  Administered 2016-02-16: 2 [IU] via SUBCUTANEOUS

## 2016-02-12 MED ORDER — LEVOTHYROXINE SODIUM 100 MCG IV SOLR
62.5000 ug | Freq: Every day | INTRAVENOUS | Status: DC
  Administered 2016-02-13 – 2016-02-16 (×4): 62.5 ug via INTRAVENOUS
  Filled 2016-02-12 (×5): qty 5

## 2016-02-12 MED ORDER — ACETAMINOPHEN 325 MG PO TABS
650.0000 mg | ORAL_TABLET | Freq: Four times a day (QID) | ORAL | Status: DC | PRN
  Administered 2016-02-14 – 2016-02-16 (×2): 650 mg via ORAL
  Filled 2016-02-12 (×2): qty 2

## 2016-02-12 MED ORDER — ONDANSETRON HCL 4 MG/2ML IJ SOLN: 4.0000 mg | Freq: Three times a day (TID) | INTRAMUSCULAR | Status: AC | PRN

## 2016-02-12 MED ORDER — IPRATROPIUM-ALBUTEROL 0.5-2.5 (3) MG/3ML IN SOLN
3.0000 mL | Freq: Four times a day (QID) | RESPIRATORY_TRACT | Status: DC
  Administered 2016-02-12 – 2016-02-14 (×6): 3 mL via RESPIRATORY_TRACT
  Filled 2016-02-12 (×6): qty 3

## 2016-02-12 MED ORDER — DEXTROSE 5 % IV SOLN
1.0000 g | INTRAVENOUS | Status: DC
  Administered 2016-02-13 – 2016-02-14 (×2): 1 g via INTRAVENOUS
  Filled 2016-02-12 (×2): qty 1

## 2016-02-12 MED ORDER — SODIUM CHLORIDE 0.9 % IV BOLUS (SEPSIS)
500.0000 mL | INTRAVENOUS | Status: AC
  Administered 2016-02-12: 500 mL via INTRAVENOUS

## 2016-02-12 MED ORDER — SODIUM CHLORIDE 0.9 % IV BOLUS (SEPSIS): 1000.0000 mL | INTRAVENOUS | Status: AC

## 2016-02-12 MED ORDER — HEPARIN SODIUM (PORCINE) 5000 UNIT/ML IJ SOLN
5000.0000 [IU] | Freq: Three times a day (TID) | INTRAMUSCULAR | Status: DC
  Administered 2016-02-12 – 2016-02-13 (×4): 5000 [IU] via SUBCUTANEOUS
  Filled 2016-02-12 (×4): qty 1

## 2016-02-12 MED ORDER — VANCOMYCIN HCL IN DEXTROSE 750-5 MG/150ML-% IV SOLN
750.0000 mg | INTRAVENOUS | Status: DC
  Administered 2016-02-12 – 2016-02-13 (×2): 750 mg via INTRAVENOUS
  Filled 2016-02-12 (×3): qty 150

## 2016-02-12 MED ORDER — CEFEPIME HCL 2 G IJ SOLR: 2.0000 g | Freq: Once | INTRAMUSCULAR | Status: DC

## 2016-02-12 MED ORDER — POLYVINYL ALCOHOL 1.4 % OP SOLN
1.0000 [drp] | Freq: Two times a day (BID) | OPHTHALMIC | Status: DC
  Administered 2016-02-12 – 2016-02-18 (×12): 1 [drp] via OPHTHALMIC
  Filled 2016-02-12: qty 15

## 2016-02-12 MED ORDER — ACETAMINOPHEN 650 MG RE SUPP: 650.0000 mg | Freq: Four times a day (QID) | RECTAL | Status: DC | PRN

## 2016-02-12 MED ORDER — SODIUM CHLORIDE 0.9 % IV SOLN: INTRAVENOUS | Status: DC

## 2016-02-12 MED ORDER — MIDAZOLAM HCL 2 MG/2ML IJ SOLN
2.0000 mg | Freq: Once | INTRAMUSCULAR | Status: AC
  Administered 2016-02-12: 2 mg via INTRAVENOUS
  Filled 2016-02-12: qty 2

## 2016-02-12 MED ORDER — SODIUM CHLORIDE 0.9 % IV BOLUS (SEPSIS)
1000.0000 mL | Freq: Once | INTRAVENOUS | Status: AC
  Administered 2016-02-12: 1000 mL via INTRAVENOUS

## 2016-02-12 MED ORDER — DEXTROSE 5 % IV SOLN
1.0000 g | Freq: Once | INTRAVENOUS | Status: AC
  Administered 2016-02-12: 1 g via INTRAVENOUS
  Filled 2016-02-12: qty 10

## 2016-02-12 MED ORDER — SODIUM CHLORIDE 0.9% FLUSH
3.0000 mL | Freq: Two times a day (BID) | INTRAVENOUS | Status: DC
  Administered 2016-02-13: 3 mL via INTRAVENOUS
  Administered 2016-02-13: 22:00:00 via INTRAVENOUS
  Administered 2016-02-14 – 2016-02-18 (×9): 3 mL via INTRAVENOUS

## 2016-02-12 MED ORDER — PROPYLENE GLYCOL 0.6 % OP SOLN: 1.0000 [drp] | Freq: Two times a day (BID) | OPHTHALMIC | Status: DC

## 2016-02-12 NOTE — Procedures (Addendum)
Per son's instructions patient is full code and all means are to be used to maintain patient's life. No family available to obtain written consent. Area was prepared in a sterile manner. After performing a timeout a left IJ was attempted using ultrasound guidance. The LIJ was located using Ultra sound guidance. After several attempts to thread the wire was unsuccessful the left IJ site was aborted. Consequently performed a right IJ in a sterile fashion using ultrasound guidance. RIJ successfully placed. Patient tolerated procedure well. Minimal blood loss. [facility].

## 2016-02-12 NOTE — H&P (Signed)
[facility] History and Physical                                                                                    [name], is a 82 y.o. female  MRN: [id number]   DOB - 12/17/1927  Admit Date - 02/12/2016  Outpatient Primary MD for the patient is Margit HanksALEXANDER, ANNE D, MD  Referring MD: Radford PaxBeaton / ER  Consulting M.D: Isaiah SergeMannam [facility]/PCCM (formal consultation pending at time of dictation)  PMH: Past Medical History  Diagnosis Date  . COPD (chronic obstructive pulmonary disease) (HCC)   . Hypertension   . Blind   . Hypothyroid   . Pacemaker   . Renal insufficiency   . Finger fracture   . Dementia   . Bipolar disorder (HCC)   . Dysphagia     needs thick nectar liquids  . Atrial fibrillation (HCC)   . Pneumonia   . Anxiety   . Diastolic heart failure (HCC)       PSH: Past Surgical History  Procedure Laterality Date  . Insert / replace / remove pacemaker    . Tracheostomy  02/27/13    decannulated 03/29/13  . Gastrostomy tube placement  03/11/13     CC:  Chief Complaint  Patient presents with  . Altered Mental Status     HPI: 82 year old female patient, resident of skilled nursing facility ,with past medical history of COPD, bipolar disorder and dementia with behavioral disturbance, known tardive dyskinesia, hypothyroidism, chronic diastolic heart failure, chronic dysphagia requiring PEG tube for feeds and meds, permanent pacemaker, hypertension and adult failure to thrive. Patient presented to the ER with reports of altered mentation from baseline. Nursing home reported to EMS patient had been running fever. Apparently for several days patient had been diaphoretic without explanation. ED physician confirmed with son who is power of attorney that patient is a full code and wishes for aggressive treatment options.  ER Evaluation and treatment: Initial rectal temp 101.3-BP 92/71-pulse 89-respirations 22-100 percent nonrebreather with O2 sat duration between 93 and  100% Since presentation patient has had very labile blood pressures with readings as low as 50 systolic to 86 systolic with most recent readings between 50 and 60 systolic EKG: Sinus rhythm with ventricular rate 91 bpm, QTC 488 ms, interventricular conduction delay; all of the QRS complexes and every lead appear to have a P-wave after the QRS complex and I am suspicious of inappropriate lead placement during capture of 12-lead Portable chest x-ray: significant patient rotation making evaluation difficult, there may be basilar atelectasis or infiltrate Lab data: Sodium 148 potassium 4.6, BUN 50, creatinine 1.4, glucose 163, anion gap 16, albumin 2.8, troponin point-of-care 0.45, serum lactate 5.11, WBC 10,000 with normal differential, hemoglobin 13.6, MCV 107, platelets 205,000; urinalysis markedly abnormal with turbid appearance, many bacteria, amber color, 15 ketones, large leukocytes, 100 of protein, too numerous to count WBCs, nitrite negative. Blood cultures and urine culture have been obtained in the ER; ABG: PH 7.4, PCO2 40.4, PO2 170, bicarbonate 25; coags within normal limits Normal saline bolus 1000 mL 1 Rocephin 1 g IV 1  Review of Systems   Unable to obtain from patient-history  obtained from the electronic medical record since son no longer longer at bedside  Social History Social History  Substance Use Topics  . Smoking status: Former Smoker -- .5 years    Types: Cigarettes  . Smokeless tobacco: Not on file  . Alcohol Use: Not on file    Resides at: [facility] skilled nursing facility  Lives with: N/A  Ambulatory status: Nonambulatory   Family History No family history on file. Unable to obtain from patient due to altered mentation and no family at bedside to confirm   Prior to Admission medications   Medication Sig Start Date End Date Taking? Authorizing Provider  beta carotene w/minerals (OCUVITE) tablet Take 1 tablet by mouth daily.    Historical  Provider, MD  docusate sodium (COLACE) 100 MG capsule Take 100 mg by mouth daily.    Historical Provider, MD  DULoxetine (CYMBALTA) 60 MG capsule Take 60 mg by mouth daily.    Historical Provider, MD  famotidine (PEPCID) 20 MG tablet Take 1 tablet (20 mg total) by mouth 2 (two) times daily. 06/11/14   Bernadene Person, NP  furosemide (LASIX) 20 MG tablet Take 20 mg by mouth daily.    Historical Provider, MD  ipratropium-albuterol (DUONEB) 0.5-2.5 (3) MG/3ML SOLN Take 3 mLs by nebulization 3 (three) times daily.    Historical Provider, MD  levothyroxine (SYNTHROID, LEVOTHROID) 125 MCG tablet Take 125 mcg by mouth daily before breakfast.    Historical Provider, MD  lidocaine (LIDODERM) 5 % Place 1 patch onto the skin 2 (two) times daily. Apply 1/2 patch to each shoulder and to each knee for 12 hours, then remove    Historical Provider, MD  meloxicam (MOBIC) 15 MG tablet Take 15 mg by mouth daily.    Historical Provider, MD  Memantine HCl-Donepezil HCl (NAMZARIC) 28-10 MG CP24 Take 1 capsule by mouth daily.    Historical Provider, MD  Multiple Vitamin (MULTIVITAMIN WITH MINERALS) TABS tablet Take 1 tablet by mouth daily.    Historical Provider, MD  Nutritional Supplements (FEEDING SUPPLEMENT, OSMOLITE 1.5 CAL,) LIQD Place 60 mLs into feeding tube 2 (two) times daily.     Historical Provider, MD  oxyCODONE (OXY IR/ROXICODONE) 5 MG immediate release tablet Take one tablet by mouth every 4 hours for pain 09/24/15   Sharon Seller, NP  OXYGEN Inhale 2 Imperial Gallon into the lungs continuous.    Historical Provider, MD  polyethylene glycol (MIRALAX / GLYCOLAX) packet Take 17 g by mouth daily.    Historical Provider, MD  Propylene Glycol (SYSTANE BALANCE) 0.6 % SOLN Apply 1 drop to eye 2 (two) times daily.    Historical Provider, MD  QUEtiapine (SEROQUEL) 50 MG tablet Take 50 mg by mouth at bedtime.    Historical Provider, MD  UNABLE TO FIND Med Name: HSG Mech soft texture, Nectar consistency for  diet order.    Historical Provider, MD  Valproic Acid 500 MG CPDR Take 500 mg by mouth 2 (two) times daily.    Historical Provider, MD    No Known Allergies  Physical Exam  Vitals  Blood pressure 71/31, pulse 63, temperature 99.1 F (37.3 C), temperature source Core (Comment), resp. rate 16, SpO2 100 %.   General: Lethargic and difficult to arouse except to painful stimuli-did become slightly more responsive opened eyes and tract around the room after receipt of 2500 mL saline fluid challenge  Psych: Flat affect with lethargy and altered mentation as described above  Neuro:   No obvious  focal neurological deficits, altered mentation unable to complete adequate testing  ENT:  Ears and Eyes appear Normal, Conjunctivae clear, PER. Very dry oral mucosa without erythema or exudates.  Neck:  Supple, No lymphadenopathy appreciated  Respiratory:  Symmetrical chest wall movement, Good air movement bilaterally but patient noted with tachypnea and shallow respiratory effort, with diffuse bilateral rhonchi. 100% NRB mask  Cardiac:  RRR, No Murmurs, diffuse nonpitting edema noted all extremities, no JVD, No carotid bruits, peripheral pulses nonpalpable palpable   Abdomen:  Hypoactive bowel sounds, Soft, Non tender, Non distended,  No masses appreciated, no obvious hepatosplenomegaly; tube unremarkable  Genitourinary: Foley catheter placed in the ER with cloudy dark urine obtained  Skin:  No Cyanosis, mottled appearance with decreased capillary refill i.e. capillary refill greater than 2 seconds  Extremities: Symmetrical without obvious trauma or injury,  no effusions.  Data Review  CBC  Recent Labs Lab 02/12/16 1440  WBC 10.0  HGB 13.6  HCT 44.2  PLT 205  MCV 107.0*  MCH 32.9  MCHC 30.8  RDW 14.7  LYMPHSABS 1.3  MONOABS 1.0  EOSABS 0.0  BASOSABS 0.0    Chemistries   Recent Labs Lab 02/12/16 1440  NA 148*  K 4.6  CL 106  CO2 26  GLUCOSE 163*  BUN 50*  CREATININE  1.40*  CALCIUM 8.9  AST 27  ALT 12*  ALKPHOS 79  BILITOT 0.5    estimated creatinine clearance is 28.7 mL/min (by C-G formula based on Cr of 1.4).  No results for input(s): TSH, T4TOTAL, T3FREE, THYROIDAB in the last 72 hours.  Invalid input(s): FREET3  Coagulation profile No results for input(s): INR, PROTIME in the last 168 hours.  No results for input(s): DDIMER in the last 72 hours.  Cardiac Enzymes No results for input(s): CKMB, TROPONINI, MYOGLOBIN in the last 168 hours.  Invalid input(s): CK  Invalid input(s): POCBNP  Urinalysis    Component Value Date/Time   COLORURINE AMBER* 02/12/2016 1532   APPEARANCEUR TURBID* 02/12/2016 1532   LABSPEC 1.017 02/12/2016 1532   PHURINE 8.5* 02/12/2016 1532   GLUCOSEU NEGATIVE 02/12/2016 1532   HGBUR SMALL* 02/12/2016 1532   BILIRUBINUR NEGATIVE 02/12/2016 1532   KETONESUR 15* 02/12/2016 1532   PROTEINUR 100* 02/12/2016 1532   UROBILINOGEN 1.0 09/22/2015 1900   NITRITE NEGATIVE 02/12/2016 1532   LEUKOCYTESUR LARGE* 02/12/2016 1532    Imaging results:   Ir Replc Gastro/colonic Tube Percut W/fluoro  01/14/2016  INDICATION: Dysphagia. Chronic gastrostomy tube, recently dislodged. Please perform fluoroscopic guided gastrostomy tube exchange EXAM: FLUOROSCOPIC GUIDED REPLACEMENT OF GASTROSTOMY TUBE COMPARISON:  Gastrostomy injection - 10/06/2015 MEDICATIONS: None. CONTRAST:  5mL OMNIPAQUE IOHEXOL 300 MG/ML SOLN administered into the gastric lumen FLUOROSCOPY TIME:  12 seconds (0.4 mGy) COMPLICATIONS: None immediate. PROCEDURE: The existing gastrostomy tube track was anesthetized with viscous lidocaine. Utilizing gentle pressure, a new new 20-French MIC balloon inflatable gastrostomy tube was advanced through the gastrostomy tract and into the gastric lumen. The balloon was inflated and disc was cinched. Contrast was injected and several spot fluoroscopic images were obtained to confirm appropriate intraluminal positioning. A dressing  was placed. The patient tolerated the procedure well without immediate postprocedural complication. IMPRESSION: Successful fluoroscopic guided replacement of a new 20-French gastrostomy tube. The new gastrostomy tube is ready for immediate use. Electronically Signed   By: Simonne Come M.D.   On: 01/14/2016 14:18   Dg Chest Portable 1 View  02/12/2016  CLINICAL DATA:  Sepsis.  Shortness of breath. EXAM: PORTABLE CHEST  1 VIEW COMPARISON:  06/29/2014. FINDINGS: Patient rotated. This makes evaluation difficult. Mild right base atelectasis and/or infiltrate cannot be excluded. No pleural effusion or pneumothorax. Heart size normal. Cardiac pacer in stable position. Old healed right rib fractures. IMPRESSION: Patient is rotated making evaluation difficult. Mild right base atelectasis and or infiltrate cannot be excluded. Follow-up chest x-ray with better alignment should be considered. Electronically Signed   By: Maisie Fus  Register   On: 02/12/2016 15:54     EKG: (Independently reviewed)  Sinus rhythm with ventricular rate 91 bpm, QTC 488 ms, interventricular conduction delay; all of the QRS complexes and every lead appear to have a P-wave after the QRS complex and I am suspicious of inappropriate lead placement during capture of 12-lead   Assessment & Plan  Principal Problem:   Septic shock 2/2 presumed UTI and HCAP -Patient presented with fever, altered mentation, lactate greater than 5, later developed significant hypotension, she has acute renal failure and demand ischemia as evidenced by mildly elevated troponin-clinically she has poor peripheral perfusion is manifested by prolonged capillary refill -Discussed at length with the length PCCM physician Dr. Arsenio Loader and initial recommendation is to begin Neo-Synephrine through peripheral IV until can be evaluated formally by intensivist and admit to the ICU (Dr. Isaiah Serge) -Admit to ICU/Inpatient -Source likely pulmonary and /or urinary tract therefore have  initiated broad-spectrum antibiotics with Maxipime and vancomycin -Urine output remains low despite receipt of significant volume of fluid therefore will continue volume resuscitation ( has received 2.5 L based on sepsis protocol 30 mL/kg) -continue fluids at 125 mL an hour along with vasoactive agent as above -Follow up on blood and urine cultures -Cycle lactate -Check cortisol, urinary strep and Legionella, influenza PCR -Patient's prognosis seems extremely poor given her multiple comorbidities and profound deconditioning so at some point during the hospitalization recommend palliative evaluation for goals of care  **538pm: 81/43 (54) on Neo gtt @ 20 mcg/min; Dr Isaiah Serge at bedside and will admit to ICU- Dr. Joseph Art to place CL  Active Problems:   AKI/hypernatremia/volume depletion -Appears to have a degree of chronic azotemia over the past 15 months with BUNs running about 44 with creatinine 0.9 yet prior to this renal function was more normal in 2015 with a BUN of 22 and creatinine 0.5 -Suspect patient would benefit from discontinuation of scheduled Lasix and only utilize Lasix prn especially with documented failure to thrive symptomatology -Continue volume resuscitation as above    Elevated troponin -Suspect demand ischemia in setting of hypotension and septic shock -Cycle troponin    COPD -Currently not wheezing on exam and ABG without evidence of hypercarbia or acidosis -Scheduled DuoNeb    Essential hypertension, benign -Currently hypotensive so all preadmission occasions on hold    PEG status /chronic dysphagia -Tube feedings currently on hold while hemodynamically unstable and can reevaluate in a.m.    Chronic diastolic heart failure  -Clinically appears volume depleted in setting of hypernatremia and acute kidney injury her for Will hold diuretics    Dementia with behavioral disturbance/Bipolar affective disorder/ Tardive dyskinesia -At time of admission have held all  psychotropic medications given altered mentation and septic shock picture    Chronic pain -Unclear exactly source the patient's pain but according to son he had been told that patient's pain medication had been recently up titrated    Hypothyroidism due to acquired atrophy of thyroid -Convert preadmission Synthroid to IV dosing    DVT Prophylaxis: Subcutaneous heparin  Family Communication:  Son communicated primarily with  EDP and his left prior to our evaluation of the patient for admission   Code Status:  Full code  Condition:  Critical  Discharge disposition: If survives hospitalization anticipate discharge back to skilled nursing facility  Time spent in minutes : 60      ELLIS,ALLISON L. ANP on 02/12/2016 at 4:55 PM  You may contact me by going to [email or web address] - password [email or web address]  I am available from 7a-7p but please confirm I am on the schedule by going to Amion as above.   After 7p please contact night coverage person covering me after hours  [facility]  Care during the described time interval was provided by myself and ANP Junious Silk  .  I have reviewed this patient's available data, including medical history, events of note, physical examination, and all test results as part of my evaluation. I have personally reviewed and interpreted all radiology studies.   Carolyne Littles, MD [phone] Pager

## 2016-02-12 NOTE — ED Notes (Signed)
Respiratory notified of ABG order

## 2016-02-12 NOTE — Progress Notes (Addendum)
Pharmacy Antibiotic Note  [name] is a 82 y.o. female admitted on 02/12/2016 with UTI/HCAP.  Pharmacy has been consulted for Cefepime and Vancomycin dosing. WBC 10, Tmax 100, CrCl ~2228mL/min. Pt given ceftriaxone x1 in the ED.  Plan: Cefepime 1g IV Q24h (starting 4/1) Vancomycin 750mg  IV Q24h F/U c/s, renal fxn, LOT, VT @ss   Temp (24hrs), Avg:100.8 F (38.2 C), Min:100 F (37.8 C), Max:101.3 F (38.5 C)   Recent Labs Lab 02/12/16 1440 02/12/16 1506  WBC 10.0  --   CREATININE 1.40*  --   LATICACIDVEN  --  5.11*    Estimated Creatinine Clearance: 28.7 mL/min (by C-G formula based on Cr of 1.4).    No Known Allergies  Antimicrobials this admission: 4/1 Cefepime>> 3/31 Ceftriaxone x1 3/31 Vancomycin   Thank you for allowing pharmacy to be a part of this patient's care.  [name], PharmD Pharmacy Resident  Pager: [phone] 02/12/2016 4:14 PM

## 2016-02-12 NOTE — ED Notes (Signed)
Pt here from [facility] ( [facility]) with aloc , pt was recently started on a new pain meds , cbg 196 ,  According to son pt is usually alert and oriented

## 2016-02-12 NOTE — ED Provider Notes (Signed)
CENTRAL LINE Performed by: Gwyneth SproutPLUNKETT,Kainan Patty Consent: The procedure was performed in an emergent situation. Required items: required blood products, implants, devices, and special equipment available Patient identity confirmed: arm band and provided demographic data Time out: Immediately prior to procedure a "time out" was called to verify the correct patient, procedure, equipment, support staff and site/side marked as required. Indications: vascular access Anesthesia: local infiltration Local anesthetic: lidocaine 1% with epinephrine Anesthetic total: 3 ml Patient sedated: no Preparation: skin prepped with 2% chlorhexidine Skin prep agent dried: skin prep agent completely dried prior to procedure Sterile barriers: all five maximum sterile barriers used - cap, mask, sterile gown, sterile gloves, and large sterile sheet Hand hygiene: hand hygiene performed prior to central venous catheter insertion  Location details: right IJ  Catheter type: triple lumen Catheter size: 8 Fr Pre-procedure: landmarks identified Ultrasound guidance: yes Successful placement: yes Post-procedure: line sutured and dressing applied Assessment: blood return through all parts, free fluid flow, placement verified by x-ray and no pneumothorax on x-ray Patient tolerance: Patient tolerated the procedure well with no immediate complications.   Gwyneth SproutWhitney Nadene Witherspoon, MD 02/12/16 2025

## 2016-02-12 NOTE — H&P (Addendum)
PULMONARY / CRITICAL CARE MEDICINE   Name: [name] MRN: [id number] DOB: 01/12/1928    ADMISSION DATE:  02/12/2016 CONSULTATION DATE:  02/12/16  REFERRING MD:  ED  CHIEF COMPLAINT:  Hypotension, sepsis  HISTORY OF PRESENT ILLNESS:  82 year old resident of SNF with history of COPD, bipolar disorder, dementia, hypothyroidism, diastolic heart failure, dysphagia and PEG, hypertension. She is brought today from the ED with altered mental status, fevers, sweats. And a temperature of 101, hypotensive with elevated lactic acid of 5, Cr of 1.4. PCCM called to admit.  PAST MEDICAL HISTORY :  She  has a past medical history of COPD (chronic obstructive pulmonary disease) (HCC); Hypertension; Blind; Hypothyroid; Pacemaker; Renal insufficiency; Finger fracture; Dementia; Bipolar disorder (HCC); Dysphagia; Atrial fibrillation (HCC); Pneumonia; Anxiety; and Diastolic heart failure (HCC).  PAST SURGICAL HISTORY: She  has past surgical history that includes Insert / replace / remove pacemaker; Tracheostomy (02/27/13); and Gastrostomy tube placement (03/11/13).  No Known Allergies  No current facility-administered medications on file prior to encounter.   Current Outpatient Prescriptions on File Prior to Encounter  Medication Sig  . beta carotene w/minerals (OCUVITE) tablet Take 1 tablet by mouth daily.  Marland Kitchen. docusate sodium (COLACE) 100 MG capsule Take 100 mg by mouth daily.  . furosemide (LASIX) 20 MG tablet Take 20 mg by mouth daily.  Marland Kitchen. ipratropium-albuterol (DUONEB) 0.5-2.5 (3) MG/3ML SOLN Take 3 mLs by nebulization 3 (three) times daily.  Marland Kitchen. lidocaine (LIDODERM) 5 % Place 1 patch onto the skin 2 (two) times daily. Apply 1/2 patch to each shoulder and to each knee for 12 hours, then remove  . meloxicam (MOBIC) 15 MG tablet Take 15 mg by mouth daily.  . Memantine HCl-Donepezil HCl (NAMZARIC) 28-10 MG CP24 Take 1 capsule by mouth daily.  . Multiple Vitamin (MULTIVITAMIN WITH MINERALS) TABS tablet Take  1 tablet by mouth daily.  Marland Kitchen. oxyCODONE (OXY IR/ROXICODONE) 5 MG immediate release tablet Take one tablet by mouth every 4 hours for pain (Patient taking differently: Take 5 mg by mouth See admin instructions. Take one tablet by mouth every 4 hours for pain)  . polyethylene glycol (MIRALAX / GLYCOLAX) packet Take 17 g by mouth daily.  Marland Kitchen. Propylene Glycol (SYSTANE BALANCE) 0.6 % SOLN Place 1 drop into both eyes 2 (two) times daily.   . QUEtiapine (SEROQUEL) 50 MG tablet Take 50 mg by mouth at bedtime.  . Valproic Acid 500 MG CPDR Take 500 mg by mouth 2 (two) times daily.  . DULoxetine (CYMBALTA) 60 MG capsule Take 60 mg by mouth daily.  . famotidine (PEPCID) 20 MG tablet Take 1 tablet (20 mg total) by mouth 2 (two) times daily.  Marland Kitchen. levothyroxine (SYNTHROID, LEVOTHROID) 125 MCG tablet Take 125 mcg by mouth daily before breakfast. Reported on 02/12/2016  . Nutritional Supplements (FEEDING SUPPLEMENT, OSMOLITE 1.5 CAL,) LIQD Place 60 mLs into feeding tube 2 (two) times daily. Reported on 02/12/2016  . OXYGEN Inhale 2 Imperial Gallon into the lungs continuous. Reported on 02/12/2016  . UNABLE TO FIND Reported on 02/12/2016    FAMILY HISTORY:  Her has no family status information on file.   SOCIAL HISTORY: She  reports that she has quit smoking. Her smoking use included Cigarettes. She quit after .5 years of use. She does not have any smokeless tobacco history on file. She reports that she does not use illicit drugs.  REVIEW OF SYSTEMS:    SUBJECTIVE:   VITAL SIGNS: BP 85/62 mmHg  Pulse 58  Temp(Src) 97.3 F (  36.3 C) (Core (Comment))  Resp 14  SpO2 96%  HEMODYNAMICS:   VENTILATOR SETTINGS: Vent Mode:  [-]  FiO2 (%):  [55 %] 55 %  INTAKE / OUTPUT:    PHYSICAL EXAMINATION: General: Somnolent, lethargic, difficult to arouse   Neuro:  Moves all 4 extremities, no focal deficits HEENT:  Dry mucous membranes, no JVD, thyromegaly Cardiovascular:  Regular rate and rhythm, no MRG Lungs:  Clear,  no wheeze, crackles Abdomen:  Soft, nontender, nondistended Musculoskeletal:  Normal bulk and tone Skin:  Intact  LABS:  BMET  Recent Labs Lab 02/12/16 1440  NA 148*  K 4.6  CL 106  CO2 26  BUN 50*  CREATININE 1.40*  GLUCOSE 163*    Electrolytes  Recent Labs Lab 02/12/16 1440  CALCIUM 8.9    CBC  Recent Labs Lab 02/12/16 1440  WBC 10.0  HGB 13.6  HCT 44.2  PLT 205    Coag's  Recent Labs Lab 02/12/16 1641  APTT 31  INR 1.12    Sepsis Markers  Recent Labs Lab 02/12/16 1506 02/12/16 1641  LATICACIDVEN 5.11*  --   PROCALCITON  --  0.24    ABG  Recent Labs Lab 02/12/16 1645  PHART 7.400  PCO2ART 40.4  PO2ART 170.0*    Liver Enzymes  Recent Labs Lab 02/12/16 1440  AST 27  ALT 12*  ALKPHOS 79  BILITOT 0.5  ALBUMIN 2.8*    Cardiac Enzymes No results for input(s): TROPONINI, PROBNP in the last 168 hours.  Glucose  Recent Labs Lab 02/12/16 1431  GLUCAP 155*    Imaging Dg Chest Portable 1 View  02/12/2016  CLINICAL DATA:  Sepsis.  Shortness of breath. EXAM: PORTABLE CHEST 1 VIEW COMPARISON:  06/29/2014. FINDINGS: Patient rotated. This makes evaluation difficult. Mild right base atelectasis and/or infiltrate cannot be excluded. No pleural effusion or pneumothorax. Heart size normal. Cardiac pacer in stable position. Old healed right rib fractures. IMPRESSION: Patient is rotated making evaluation difficult. Mild right base atelectasis and or infiltrate cannot be excluded. Follow-up chest x-ray with better alignment should be considered. Electronically Signed   By: Maisie Fus  Register   On: 02/12/2016 15:54     STUDIES:  CXR 3/31 > right base opacity  CULTURES: Bcx 3/31 > Ucx 3/31 >  ANTIBIOTICS: Vanco 3/31 > Cefepime 3/31 >  SIGNIFICANT EVENTS:  LINES/TUBES:  DISCUSSION: 82 year old SNF resident with multiple medical problems. Admitted with severe sepsis. Likely source pneumonia, urine.  ASSESSMENT /  PLAN:  PULMONARY A: COPD Pneumonia P:   Monitor resp status Check ABG Intubation risk  CARDIOVASCULAR A:  Hypotension secondary to sepsis. Elevated LA P:  Follow LA Check TnI, BNP  RENAL A:   AKI- Secondary to sepsis, ATN, prerenal P:   Monitor urine output and creatinine after resuscitation  GASTROINTESTINAL A:   No issues P:   Keep NPO.  HEMATOLOGIC A:   Stable P:  Monitor  INFECTIOUS A:   Sepsis secondary to pneumonia, UTI P:   Vanco, cefepime as above. Check Flu panel Urine legionella and pneumococcus Procalcitonin protocol  ENDOCRINE A:    Stable P:   SSI  NEUROLOGIC A:   Altered mental status. Likely secondary to sepsis, encephalopathy. P:   Consider CT head if there is no improvement in mental status.  FAMILY  - Updates: No family at bedside but as per ED they want everything done. Unable to reach them. Will need to readdress in AM. - Inter-disciplinary family meet or Palliative Care meeting due  by:  4/7  Critical care time- 35 mins.  Chilton Greathouse MD [facility] Pager [phone] If no answer or after 3pm call: [phone] 02/12/2016, 7:50 PM

## 2016-02-12 NOTE — ED Provider Notes (Signed)
CSN: [id number]     Arrival date & time 02/12/16  1425 History   First MD Initiated Contact with Patient 02/12/16 1430     Chief Complaint  Patient presents with  . Altered Mental Status      Patient is a 82 y.o. female presenting with altered mental status.  Altered Mental Status Patient brought in from nursing home where she had altered mental status.  According to EMS the nursing home told them that she was been running a fever.  Questionable whether she had a change in her pain medicine recently.  Patient is a full code as per son who is power of attorney.  Past Medical History  Diagnosis Date  . COPD (chronic obstructive pulmonary disease) (HCC)   . Hypertension   . Blind   . Hypothyroid   . Pacemaker   . Renal insufficiency   . Finger fracture   . Dementia   . Bipolar disorder (HCC)   . Dysphagia     needs thick nectar liquids  . Atrial fibrillation (HCC)   . Pneumonia   . Anxiety   . Diastolic heart failure [facility])    Past Surgical History  Procedure Laterality Date  . Insert / replace / remove pacemaker    . Tracheostomy  02/27/13    decannulated 03/29/13  . Gastrostomy tube placement  03/11/13   No family history on file. Social History  Substance Use Topics  . Smoking status: Former Smoker -- .5 years    Types: Cigarettes  . Smokeless tobacco: None  . Alcohol Use: None   OB History    No data available     Review of Systems  Unable to perform ROS: Mental status change      Allergies  Review of patient's allergies indicates no known allergies.  Home Medications   Prior to Admission medications   Medication Sig Start Date End Date Taking? Authorizing Provider  beta carotene w/minerals (OCUVITE) tablet Take 1 tablet by mouth daily.    Historical Provider, MD  docusate sodium (COLACE) 100 MG capsule Take 100 mg by mouth daily.    Historical Provider, MD  DULoxetine (CYMBALTA) 60 MG capsule Take 60 mg by mouth daily.    Historical Provider, MD   famotidine (PEPCID) 20 MG tablet Take 1 tablet (20 mg total) by mouth 2 (two) times daily. 06/11/14   Bernadene Person, NP  furosemide (LASIX) 20 MG tablet Take 20 mg by mouth daily.    Historical Provider, MD  ipratropium-albuterol (DUONEB) 0.5-2.5 (3) MG/3ML SOLN Take 3 mLs by nebulization 3 (three) times daily.    Historical Provider, MD  levothyroxine (SYNTHROID, LEVOTHROID) 125 MCG tablet Take 125 mcg by mouth daily before breakfast.    Historical Provider, MD  lidocaine (LIDODERM) 5 % Place 1 patch onto the skin 2 (two) times daily. Apply 1/2 patch to each shoulder and to each knee for 12 hours, then remove    Historical Provider, MD  meloxicam (MOBIC) 15 MG tablet Take 15 mg by mouth daily.    Historical Provider, MD  Memantine HCl-Donepezil HCl (NAMZARIC) 28-10 MG CP24 Take 1 capsule by mouth daily.    Historical Provider, MD  Multiple Vitamin (MULTIVITAMIN WITH MINERALS) TABS tablet Take 1 tablet by mouth daily.    Historical Provider, MD  Nutritional Supplements (FEEDING SUPPLEMENT, OSMOLITE 1.5 CAL,) LIQD Place 60 mLs into feeding tube 2 (two) times daily.     Historical Provider, MD  oxyCODONE (OXY IR/ROXICODONE) 5 MG immediate release  tablet Take one tablet by mouth every 4 hours for pain 09/24/15   Sharon SellerJessica K Eubanks, NP  OXYGEN Inhale 2 Imperial Gallon into the lungs continuous.    Historical Provider, MD  polyethylene glycol (MIRALAX / GLYCOLAX) packet Take 17 g by mouth daily.    Historical Provider, MD  Propylene Glycol (SYSTANE BALANCE) 0.6 % SOLN Apply 1 drop to eye 2 (two) times daily.    Historical Provider, MD  QUEtiapine (SEROQUEL) 50 MG tablet Take 50 mg by mouth at bedtime.    Historical Provider, MD  UNABLE TO FIND Med Name: HSG Mech soft texture, Nectar consistency for diet order.    Historical Provider, MD  Valproic Acid 500 MG CPDR Take 500 mg by mouth 2 (two) times daily.    Historical Provider, MD   BP 86/57 mmHg  Pulse 71  Temp(Src) 100 F (37.8 C) (Core  (Comment))  Resp 18  SpO2 93% Physical Exam  Constitutional: She appears well-developed and well-nourished. She appears toxic.  HENT:  Head: Normocephalic and atraumatic.  Eyes: Pupils are equal, round, and reactive to light.  Neck: Normal range of motion.  Cardiovascular: Normal rate and intact distal pulses.   Pulmonary/Chest: No respiratory distress.  Abdominal: Normal appearance. She exhibits no distension. There is no tenderness. There is no rebound.  Skin: Skin is warm. No rash noted. She is diaphoretic. There is pallor.  Nursing note and vitals reviewed.   ED Course  Procedures (including critical care time) Medications  cefTRIAXone (ROCEPHIN) 1 g in dextrose 5 % 50 mL IVPB (not administered)  sodium chloride 0.9 % bolus 1,000 mL (not administered)  sodium chloride 0.9 % bolus 1,000 mL (not administered)    Followed by  sodium chloride 0.9 % bolus 500 mL (not administered)  ceFEPIme (MAXIPIME) 2 g in dextrose 5 % 50 mL IVPB (not administered)  sodium chloride 0.9 % bolus 1,000 mL (1,000 mLs Intravenous New Bag/Given 02/12/16 1545)    CRITICAL CARE Performed by: Nelva NayBEATON,Welma Mccombs L Total critical care time: 45 minutes Critical care time was exclusive of separately billable procedures and treating other patients. Critical care was necessary to treat or prevent imminent or life-threatening deterioration. Critical care was time spent personally by me on the following activities: development of treatment plan with patient and/or surrogate as well as nursing, discussions with consultants, evaluation of patient's response to treatment, examination of patient, obtaining history from patient or surrogate, ordering and performing treatments and interventions, ordering and review of laboratory studies, ordering and review of radiographic studies, pulse oximetry and re-evaluation of patient's condition.  Labs Review Labs Reviewed  CBC WITH DIFFERENTIAL/PLATELET - Abnormal; Notable for the  following:    MCV 107.0 (*)    All other components within normal limits  COMPREHENSIVE METABOLIC PANEL - Abnormal; Notable for the following:    Sodium 148 (*)    Glucose, Bld 163 (*)    BUN 50 (*)    Creatinine, Ser 1.40 (*)    Albumin 2.8 (*)    ALT 12 (*)    GFR calc non Af Amer 33 (*)    GFR calc Af Amer 39 (*)    Anion gap 16 (*)    All other components within normal limits  URINALYSIS, ROUTINE W REFLEX MICROSCOPIC (NOT AT [facility]) - Abnormal; Notable for the following:    Color, Urine AMBER (*)    APPearance TURBID (*)    pH 8.5 (*)    Hgb urine dipstick SMALL (*)  Ketones, ur 15 (*)    Protein, ur 100 (*)    Leukocytes, UA LARGE (*)    All other components within normal limits  URINE MICROSCOPIC-ADD ON - Abnormal; Notable for the following:    Squamous Epithelial / LPF 0-5 (*)    Bacteria, UA MANY (*)    All other components within normal limits  CBG MONITORING, ED - Abnormal; Notable for the following:    Glucose-Capillary 155 (*)    All other components within normal limits  I-STAT TROPOININ, ED - Abnormal; Notable for the following:    Troponin i, poc 0.45 (*)    All other components within normal limits  I-STAT CG4 LACTIC ACID, ED - Abnormal; Notable for the following:    Lactic Acid, Venous 5.11 (*)    All other components within normal limits  URINE CULTURE  CULTURE, BLOOD (ROUTINE X 2)  CULTURE, BLOOD (ROUTINE X 2)  PROCALCITONIN  PROTIME-INR  APTT  CORTISOL    Imaging Review Dg Chest Portable 1 View  02/12/2016  CLINICAL DATA:  Sepsis.  Shortness of breath. EXAM: PORTABLE CHEST 1 VIEW COMPARISON:  06/29/2014. FINDINGS: Patient rotated. This makes evaluation difficult. Mild right base atelectasis and/or infiltrate cannot be excluded. No pleural effusion or pneumothorax. Heart size normal. Cardiac pacer in stable position. Old healed right rib fractures. IMPRESSION: Patient is rotated making evaluation difficult. Mild right base atelectasis and or  infiltrate cannot be excluded. Follow-up chest x-ray with better alignment should be considered. Electronically Signed   By: Maisie Fus  Register   On: 02/12/2016 15:54   I have personally reviewed and evaluated these images and lab results as part of my medical decision-making.   EKG Interpretation   Date/Time:  Friday February 12 2016 14:30:42 EDT Ventricular Rate:  91 PR Interval:    QRS Duration: 122 QT Interval:  397 QTC Calculation: 488 R Axis:   16 Text Interpretation:  Normal sinus rhythm Nonspecific intraventricular  conduction delay Abnormal ekg Confirmed by Shaterica Mcclatchy  MD, Madysyn Hanken ([id number]) on  02/12/2016 3:40:28 PM      MDM   Final diagnoses:  Sepsis, due to unspecified organism [facility])  UTI (lower urinary tract infection)        Nelva Nay, MD 02/12/16 1622

## 2016-02-13 ENCOUNTER — Inpatient Hospital Stay (HOSPITAL_COMMUNITY): Payer: Medicare Other

## 2016-02-13 DIAGNOSIS — J449 Chronic obstructive pulmonary disease, unspecified: Secondary | ICD-10-CM

## 2016-02-13 DIAGNOSIS — N179 Acute kidney failure, unspecified: Secondary | ICD-10-CM

## 2016-02-13 LAB — CBC
HEMATOCRIT: 29.6 % — AB (ref 36.0–46.0)
Hemoglobin: 9.4 g/dL — ABNORMAL LOW (ref 12.0–15.0)
MCH: 33.8 pg (ref 26.0–34.0)
MCHC: 31.8 g/dL (ref 30.0–36.0)
MCV: 106.5 fL — AB (ref 78.0–100.0)
Platelets: 131 10*3/uL — ABNORMAL LOW (ref 150–400)
RBC: 2.78 MIL/uL — ABNORMAL LOW (ref 3.87–5.11)
RDW: 14.4 % (ref 11.5–15.5)
WBC: 6.8 10*3/uL (ref 4.0–10.5)

## 2016-02-13 LAB — BLOOD GAS, ARTERIAL
Acid-Base Excess: 0 mmol/L (ref 0.0–2.0)
Bicarbonate: 24.1 mEq/L — ABNORMAL HIGH (ref 20.0–24.0)
Drawn by: 290171
FIO2: 0.21
O2 SAT: 96.3 %
PCO2 ART: 38.6 mmHg (ref 35.0–45.0)
PO2 ART: 93 mmHg (ref 80.0–100.0)
Patient temperature: 98.6
TCO2: 25.3 mmol/L (ref 0–100)
pH, Arterial: 7.412 (ref 7.350–7.450)

## 2016-02-13 LAB — CARBOXYHEMOGLOBIN
CARBOXYHEMOGLOBIN: 0.9 % (ref 0.5–1.5)
CARBOXYHEMOGLOBIN: 1.1 % (ref 0.5–1.5)
METHEMOGLOBIN: 0.7 % (ref 0.0–1.5)
METHEMOGLOBIN: 0.7 % (ref 0.0–1.5)
O2 Saturation: 49.2 %
O2 Saturation: 70.4 %
Total hemoglobin: 10.6 g/dL — ABNORMAL LOW (ref 12.0–16.0)
Total hemoglobin: 9.1 g/dL — ABNORMAL LOW (ref 12.0–16.0)

## 2016-02-13 LAB — COMPREHENSIVE METABOLIC PANEL
ALBUMIN: 2 g/dL — AB (ref 3.5–5.0)
ALK PHOS: 50 U/L (ref 38–126)
ALT: 9 U/L — AB (ref 14–54)
ANION GAP: 5 (ref 5–15)
AST: 17 U/L (ref 15–41)
BUN: 32 mg/dL — ABNORMAL HIGH (ref 6–20)
CHLORIDE: 114 mmol/L — AB (ref 101–111)
CO2: 26 mmol/L (ref 22–32)
Calcium: 7.1 mg/dL — ABNORMAL LOW (ref 8.9–10.3)
Creatinine, Ser: 0.73 mg/dL (ref 0.44–1.00)
GFR calc non Af Amer: >60 mL/min (ref >60)
GLUCOSE: 93 mg/dL (ref 65–99)
Potassium: 3.8 mmol/L (ref 3.5–5.1)
SODIUM: 145 mmol/L (ref 135–145)
Total Bilirubin: 0.4 mg/dL (ref 0.3–1.2)
Total Protein: 4.7 g/dL — ABNORMAL LOW (ref 6.5–8.1)

## 2016-02-13 LAB — GLUCOSE, CAPILLARY
GLUCOSE-CAPILLARY: 124 mg/dL — AB (ref 65–99)
GLUCOSE-CAPILLARY: 81 mg/dL (ref 65–99)
GLUCOSE-CAPILLARY: 89 mg/dL (ref 65–99)
Glucose-Capillary: 105 mg/dL — ABNORMAL HIGH (ref 65–99)
Glucose-Capillary: 87 mg/dL (ref 65–99)
Glucose-Capillary: 95 mg/dL (ref 65–99)
Glucose-Capillary: 93 mg/dL (ref 65–99)

## 2016-02-13 LAB — PROTIME-INR
INR: 1.13 (ref 0.00–1.49)
Prothrombin Time: 14.7 seconds (ref 11.6–15.2)

## 2016-02-13 LAB — PHOSPHORUS: PHOSPHORUS: 2.7 mg/dL (ref 2.5–4.6)

## 2016-02-13 LAB — STREP PNEUMONIAE URINARY ANTIGEN: Strep Pneumo Urinary Antigen: NEGATIVE

## 2016-02-13 LAB — LACTIC ACID, PLASMA: LACTIC ACID, VENOUS: 1.7 mmol/L (ref 0.5–2.0)

## 2016-02-13 LAB — TROPONIN I
Troponin I: 0.3 ng/mL — ABNORMAL HIGH (ref <0.031)
Troponin I: 0.14 ng/mL — ABNORMAL HIGH (ref <0.031)

## 2016-02-13 LAB — MAGNESIUM: Magnesium: 2 mg/dL (ref 1.7–2.4)

## 2016-02-13 LAB — INFLUENZA PANEL BY PCR (TYPE A & B)
H1N1FLUPCR: NOT DETECTED
INFLBPCR: NEGATIVE
Influenza A By PCR: NEGATIVE

## 2016-02-13 LAB — PROCALCITONIN: PROCALCITONIN: 0.28 ng/mL

## 2016-02-13 LAB — MRSA PCR SCREENING: MRSA by PCR: NEGATIVE

## 2016-02-13 MED ORDER — SODIUM CHLORIDE 0.9 % IV BOLUS (SEPSIS)
1000.0000 mL | Freq: Once | INTRAVENOUS | Status: AC
  Administered 2016-02-13: 1000 mL via INTRAVENOUS

## 2016-02-13 MED ORDER — DEXTROSE-NACL 5-0.45 % IV SOLN
INTRAVENOUS | Status: DC
  Administered 2016-02-13 – 2016-02-16 (×4): via INTRAVENOUS

## 2016-02-13 NOTE — Progress Notes (Signed)
Removed VM (14L/55%)  from pt and placed on 8L HFNC. RN aware. RT will continue to monitor

## 2016-02-13 NOTE — Evaluation (Signed)
Clinical/Bedside Swallow Evaluation Patient Details  Name: [name] MRN: [id number] Date of Birth: 01/18/1928  Today's Date: 02/13/2016 Time: SLP Start Time (ACUTE ONLY): 1512 SLP Stop Time (ACUTE ONLY): 1519 SLP Time Calculation (min) (ACUTE ONLY): 7 min  Past Medical History:  Past Medical History  Diagnosis Date  . COPD (chronic obstructive pulmonary disease) (HCC)   . Hypertension   . Blind   . Hypothyroid   . Pacemaker   . Renal insufficiency   . Finger fracture   . Dementia   . Bipolar disorder (HCC)   . Dysphagia     needs thick nectar liquids  . Atrial fibrillation (HCC)   . Pneumonia   . Anxiety   . Diastolic heart failure [facility](HCC)    Past Surgical History:  Past Surgical History  Procedure Laterality Date  . Insert / replace / remove pacemaker    . Tracheostomy  02/27/13    decannulated 03/29/13  . Gastrostomy tube placement  03/11/13   HPI:  82 year old resident of SNF with history of COPD, bipolar disorder, dementia, hypothyroidism, diastolic heart failure, dysphagia and PEG, hypertension, dementia, admitted with severe sepsis. Likely source pneumonia, urine. Initial CXR with possible RLL infiltrate, but most recent CXR 4/1 shows stable left basilar opacity most consistent with subsegmental atelectasis. Pt had most recent MBS in 07/2013 recommending Dys 1 diet and thin liquids, an advancement from nectar thick liquids. At that time, pt had been decannulated from prior trach but had PEG due to poor oral intake. Most recent swallow evaluation at bedside in 2015 recommending Dys 1 diet and nectar thick liquids due to concern for silent aspiration post-extubation.   Assessment / Plan / Recommendation Clinical Impression  Pt is lethargic upon SLP arrival. She opened her eyes briefly x1 as SLP performed oral care, tightly pursed her lips, and then dosed off immediately after. No further oral movements noted during the remainder of oral care. Pt not yet alert enough to  participate in PO trials. Will continue to follow for readiness.    Aspiration Risk  Severe aspiration risk    Diet Recommendation NPO   Medication Administration: Via alternative means    Other  Recommendations Oral Care Recommendations: Oral care QID   Follow up Recommendations   (tba)    Frequency and Duration min 2x/week  2 weeks       Prognosis Prognosis for Safe Diet Advancement: Good Barriers to Reach Goals: Other (Comment) (h/o dysphagia)      Swallow Study   General HPI: 82 year old resident of SNF with history of COPD, bipolar disorder, dementia, hypothyroidism, diastolic heart failure, dysphagia and PEG, hypertension, dementia, admitted with severe sepsis. Likely source pneumonia, urine. Initial CXR with possible RLL infiltrate, but most recent CXR 4/1 shows stable left basilar opacity most consistent with subsegmental atelectasis. Pt had most recent MBS in 07/2013 recommending Dys 1 diet and thin liquids, an advancement from nectar thick liquids. At that time, pt had been decannulated from prior trach but had PEG due to poor oral intake. Most recent swallow evaluation at bedside in 2015 recommending Dys 1 diet and nectar thick liquids due to concern for silent aspiration post-extubation. Type of Study: Bedside Swallow Evaluation Previous Swallow Assessment: see HPI Diet Prior to this Study: NPO Temperature Spikes Noted: Yes (101.3) Respiratory Status: Nasal cannula History of Recent Intubation: No Behavior/Cognition: Lethargic/Drowsy Oral Cavity Assessment: Other (comment) (unable to visualize) Oral Care Completed by SLP: Yes Patient Positioning: Postural control interferes with function (limited upright head posture)  Baseline Vocal Quality: Not observed    Oral/Motor/Sensory Function Overall Oral Motor/Sensory Function:  (UTA)   Ice Chips Ice chips: Not tested   Thin Liquid Thin Liquid: Not tested    Nectar Thick Nectar Thick Liquid: Not tested   Honey Thick  Honey Thick Liquid: Not tested   Puree Puree: Not tested   Solid   GO   Solid: Not tested       Maxcine Ham, M.A. CCC-SLP [id number]  Maxcine Ham 02/13/2016,3:37 PM

## 2016-02-13 NOTE — Progress Notes (Signed)
PULMONARY / CRITICAL CARE MEDICINE   Name: [name] MRN: [id number] DOB: 1927-12-27    ADMISSION DATE:  02/12/2016 CONSULTATION DATE:  02/12/16  REFERRING MD:  ED  CHIEF COMPLAINT:  Hypotension, sepsis  HISTORY OF PRESENT ILLNESS:  82 year old resident of SNF with history of COPD, bipolar disorder, dementia, hypothyroidism, diastolic heart failure, dysphagia and PEG, hypertension. She is brought today from the ED with altered mental status, fevers, sweats. And a temperature of 101, hypotensive with elevated lactic acid of 5, Cr of 1.4. PCCM called to admit.  SUBJECTIVE:  No new changes; weaning pressors  VITAL SIGNS: BP 103/60 mmHg  Pulse 60  Temp(Src) 98 F (36.7 C) (Oral)  Resp 19  SpO2 100%  HEMODYNAMICS: CVP:  [9 mmHg-10 mmHg] 10 mmHg VENTILATOR SETTINGS: Vent Mode:  [-]  FiO2 (%):  [55 %] 55 %  INTAKE / OUTPUT: I/O last 3 completed shifts: In: 321.5 [I.V.:321.5] Out: -   PHYSICAL EXAMINATION: General: slow to awaken, c/o pain all over when she is awake  Neuro:  Moves all 4 extremities, no focal deficits HEENT:  Dry mucous membranes, no JVD, thyromegaly Cardiovascular:  Regular rate and rhythm, no MRG Lungs:  Scattered rhonchi, course cough  Abdomen:  Soft, nontender, nondistended Musculoskeletal:  Normal bulk and tone Skin:  Intact  LABS:  BMET  Recent Labs Lab 02/12/16 1440 02/13/16 0445  NA 148* 145  K 4.6 3.8  CL 106 114*  CO2 26 26  BUN 50* 32*  CREATININE 1.40* 0.73  GLUCOSE 163* 93    Electrolytes  Recent Labs Lab 02/12/16 1440 02/13/16 0445  CALCIUM 8.9 7.1*  MG  --  2.0  PHOS  --  2.7   CBC  Recent Labs Lab 02/12/16 1440 02/13/16 0445  WBC 10.0 6.8  HGB 13.6 9.4*  HCT 44.2 29.6*  PLT 205 131*   Coag's  Recent Labs Lab 02/12/16 1641 02/13/16 0445  APTT 31  --   INR 1.12 1.13   Sepsis Markers  Recent Labs Lab 02/12/16 1506 02/12/16 1641 02/12/16 2109 02/12/16 2123 02/13/16 0105 02/13/16 0445   LATICACIDVEN 5.11*  --   --  1.87 1.7  --   PROCALCITON  --  0.24 0.32  --   --  0.28   ABG  Recent Labs Lab 02/12/16 1645 02/13/16 0525  PHART 7.400 7.412  PCO2ART 40.4 38.6  PO2ART 170.0* 93.0   Liver Enzymes  Recent Labs Lab 02/12/16 1440 02/13/16 0445  AST 27 17  ALT 12* 9*  ALKPHOS 79 50  BILITOT 0.5 0.4  ALBUMIN 2.8* 2.0*    Cardiac Enzymes  Recent Labs Lab 02/12/16 2109 02/13/16 0040 02/13/16 0930  TROPONINI 1.85* 0.30* 0.14*   Glucose  Recent Labs Lab 02/12/16 1431 02/12/16 2210 02/13/16 0012 02/13/16 0349 02/13/16 0859  GLUCAP 155* 105* 124* 81 87    Imaging Dg Chest Port 1 View  02/13/2016  CLINICAL DATA:  Sepsis. EXAM: PORTABLE CHEST 1 VIEW COMPARISON:  February 12, 2016. FINDINGS: Stable cardiomegaly. Left-sided pacemaker is unchanged in position. Right internal jugular catheter is unchanged with distal tip overlying expected position of the SVC. No pneumothorax is noted. Stable mild left basilar opacity is noted consistent with subsegmental atelectasis. Right lung is clear. Probable old distal right clavicular fracture is noted. IMPRESSION: Stable left basilar opacity is noted most consistent with subsegmental atelectasis. Electronically Signed   By: Lupita Raider, M.D.   On: 02/13/2016 07:36   Dg Chest Portable 1 View  02/12/2016  CLINICAL DATA:  82 year old female with central line placement. EXAM: PORTABLE CHEST 1 VIEW COMPARISON:  02/12/2016 and prior radiographs FINDINGS: The patient is rotated. Right IJ central venous catheter is noted with tip overlying the upper-mid SVC. There is no evidence of pneumothorax. Mild bibasilar atelectasis is present. Cardiomegaly left-sided pacemaker again identified. IMPRESSION: Right IJ central venous catheter with tip overlying the upper-mid SVC. No evidence of pneumothorax. Electronically Signed   By: Harmon PierJeffrey  Hu M.D.   On: 02/12/2016 20:21   Dg Chest Portable 1 View  02/12/2016  CLINICAL DATA:  Sepsis.   Shortness of breath. EXAM: PORTABLE CHEST 1 VIEW COMPARISON:  06/29/2014. FINDINGS: Patient rotated. This makes evaluation difficult. Mild right base atelectasis and/or infiltrate cannot be excluded. No pleural effusion or pneumothorax. Heart size normal. Cardiac pacer in stable position. Old healed right rib fractures. IMPRESSION: Patient is rotated making evaluation difficult. Mild right base atelectasis and or infiltrate cannot be excluded. Follow-up chest x-ray with better alignment should be considered. Electronically Signed   By: Maisie Fushomas  Register   On: 02/12/2016 15:54  left LL airspace disease.    STUDIES:  CXR 3/31 > right base opacity  CULTURES: Bcx 3/31 > Ucx 3/31 >  ANTIBIOTICS: Vanco 3/31 > Cefepime 3/31 >  SIGNIFICANT EVENTS:  LINES/TUBES:  DISCUSSION: 82 year old SNF resident with multiple medical problems. Admitted with severe sepsis. Likely source pneumonia, urine. No growth to date. In setting of dementia I worry about aspiration. Her pressor demands are decreasing and I suspect we will have her off pressors soon.   ASSESSMENT / PLAN:  PULMONARY A: COPD Pneumonia P:   Wean FIO2 pulm hygiene SLP eval for swallowing   CARDIOVASCULAR A:  Hypotension secondary to sepsis. Demand ischemia  Elevated LA-->cleared P:  Cont tele Cont IVFs D/c LA trending  RENAL A:   AKI- Secondary to sepsis, ATN, prerenal-->resolved Lactic acidosis-->resolved Hyperchloremia  P:   Change to D51/2 ns F/u am chemistry    GASTROINTESTINAL A:   No issues P:   Keep NPO. SLP eval-->r/o dysphagia   HEMATOLOGIC A:   Stable P:  Monitor  INFECTIOUS A:   Sepsis secondary to pneumonia, UTI Influenza and U strep negative P:   Vanco, cefepime as above. F/u Urine legionella  Procalcitonin protocol  ENDOCRINE A:    Stable P:   SSI  NEUROLOGIC A:   Altered mental status. Likely secondary to sepsis, encephalopathy. P:   Cont supportive care   FAMILY  -  Updates: No family at bedside but as per ED they want everything done. Unable to reach them. Will need to readdress in AM. - Inter-disciplinary family meet or Palliative Care meeting due by:  4/7  Simonne MartinetPeter E Babcock ACNP-BC [facility] Pulmonary/Critical Care Pager # [phone] OR # [phone] if no answer  Attending Note:  82 year old female with dementia with aspiration pneumonia and septic shock.  Patient has dysphagia history.  On exam, coarse BS diffusely but gag is intact.  I reviewed CXR myself, LLL opacity noted.  This is likely to be an ongoing issue due to dysphagia, pneumonia and body habitus.  There is no family available but need to have EOL discussion as I do not foresee this issue addressed.  In the meantime, continue monitoring in the ICU.  Titrate Neo down as able.  And continue abx.  The patient is critically ill with multiple organ systems failure and requires high complexity decision making for assessment and support, frequent evaluation and titration of therapies, application of  advanced monitoring technologies and extensive interpretation of multiple databases.   Critical Care Time devoted to patient care services described in this note is  35  Minutes. This time reflects time of care of this signee Dr Jennet Maduro. This critical care time does not reflect procedure time, or teaching time or supervisory time of PA/NP/Med student/Med Resident etc but could involve care discussion time.  Rush Farmer, M.D. [facility] Pulmonary/Critical Care Medicine. Pager: [phone]. After hours pager: [phone].

## 2016-02-13 NOTE — Progress Notes (Signed)
Received call from lab.  Pt's aerobic blood culture bottle positive for gram positive cocci in clusters.  Notified Melanie SimmondsPete Babcock NP.

## 2016-02-14 DIAGNOSIS — J189 Pneumonia, unspecified organism: Secondary | ICD-10-CM

## 2016-02-14 DIAGNOSIS — I5032 Chronic diastolic (congestive) heart failure: Secondary | ICD-10-CM

## 2016-02-14 LAB — BASIC METABOLIC PANEL
ANION GAP: 7 (ref 5–15)
BUN: 12 mg/dL (ref 6–20)
CHLORIDE: 103 mmol/L (ref 101–111)
CO2: 25 mmol/L (ref 22–32)
Calcium: 7.9 mg/dL — ABNORMAL LOW (ref 8.9–10.3)
Creatinine, Ser: 0.58 mg/dL (ref 0.44–1.00)
GFR calc Af Amer: >60 mL/min (ref >60)
GLUCOSE: 175 mg/dL — AB (ref 65–99)
POTASSIUM: 3.4 mmol/L — AB (ref 3.5–5.1)
Sodium: 135 mmol/L (ref 135–145)

## 2016-02-14 LAB — GLUCOSE, CAPILLARY
GLUCOSE-CAPILLARY: 65 mg/dL (ref 65–99)
GLUCOSE-CAPILLARY: 160 mg/dL — AB (ref 65–99)
GLUCOSE-CAPILLARY: 98 mg/dL (ref 65–99)
Glucose-Capillary: 44 mg/dL — CL (ref 65–99)
Glucose-Capillary: 75 mg/dL (ref 65–99)
Glucose-Capillary: 88 mg/dL (ref 65–99)
Glucose-Capillary: 92 mg/dL (ref 65–99)
Glucose-Capillary: 99 mg/dL (ref 65–99)

## 2016-02-14 LAB — CBC WITH DIFFERENTIAL/PLATELET
BASOS ABS: 0 10*3/uL (ref 0.0–0.1)
Basophils Relative: 0 %
Eosinophils Absolute: 0.2 10*3/uL (ref 0.0–0.7)
Eosinophils Relative: 4 %
HCT: 29.4 % — ABNORMAL LOW (ref 36.0–46.0)
Hemoglobin: 9.3 g/dL — ABNORMAL LOW (ref 12.0–15.0)
LYMPHS PCT: 7 %
Lymphs Abs: 0.5 10*3/uL — ABNORMAL LOW (ref 0.7–4.0)
MCH: 32.6 pg (ref 26.0–34.0)
MCHC: 31.6 g/dL (ref 30.0–36.0)
MCV: 103.2 fL — AB (ref 78.0–100.0)
MONO ABS: 0.3 10*3/uL (ref 0.1–1.0)
MONOS PCT: 5 %
NEUTROS ABS: 5.6 10*3/uL (ref 1.7–7.7)
Neutrophils Relative %: 85 %
PLATELETS: 124 10*3/uL — AB (ref 150–400)
RBC: 2.85 MIL/uL — ABNORMAL LOW (ref 3.87–5.11)
RDW: 13.5 % (ref 11.5–15.5)
WBC: 6.6 10*3/uL (ref 4.0–10.5)

## 2016-02-14 LAB — URINE CULTURE

## 2016-02-14 LAB — PROTIME-INR
INR: 1.02 (ref 0.00–1.49)
Prothrombin Time: 13.6 seconds (ref 11.6–15.2)

## 2016-02-14 LAB — TYPE AND SCREEN
ABO/RH(D): O POS
Antibody Screen: NEGATIVE

## 2016-02-14 LAB — APTT: aPTT: 33 seconds (ref 24–37)

## 2016-02-14 LAB — ABO/RH: ABO/RH(D): O POS

## 2016-02-14 LAB — PROCALCITONIN: Procalcitonin: 0.2 ng/mL

## 2016-02-14 MED ORDER — FENTANYL CITRATE (PF) 100 MCG/2ML IJ SOLN: 25.0000 ug | INTRAMUSCULAR | Status: DC | PRN

## 2016-02-14 MED ORDER — DEXTROSE 50 % IV SOLN
INTRAVENOUS | Status: AC
  Administered 2016-02-14: 25 mL via INTRAVENOUS
  Filled 2016-02-14: qty 50

## 2016-02-14 MED ORDER — POTASSIUM CHLORIDE 10 MEQ/100ML IV SOLN
10.0000 meq | INTRAVENOUS | Status: AC
  Administered 2016-02-14 (×4): 10 meq via INTRAVENOUS
  Filled 2016-02-14 (×3): qty 100

## 2016-02-14 MED ORDER — IPRATROPIUM-ALBUTEROL 0.5-2.5 (3) MG/3ML IN SOLN
3.0000 mL | Freq: Three times a day (TID) | RESPIRATORY_TRACT | Status: DC
  Administered 2016-02-14 – 2016-02-18 (×10): 3 mL via RESPIRATORY_TRACT
  Filled 2016-02-14 (×12): qty 3

## 2016-02-14 MED ORDER — IPRATROPIUM-ALBUTEROL 0.5-2.5 (3) MG/3ML IN SOLN: 3.0000 mL | RESPIRATORY_TRACT | Status: DC | PRN

## 2016-02-14 MED ORDER — CEFEPIME HCL 1 G IJ SOLR
1.0000 g | Freq: Three times a day (TID) | INTRAMUSCULAR | Status: DC
  Administered 2016-02-14 – 2016-02-15 (×3): 1 g via INTRAVENOUS
  Filled 2016-02-14 (×5): qty 1

## 2016-02-14 MED ORDER — DEXTROSE 50 % IV SOLN
25.0000 mL | Freq: Once | INTRAVENOUS | Status: AC
  Administered 2016-02-14: 25 mL via INTRAVENOUS

## 2016-02-14 NOTE — Evaluation (Signed)
Clinical/Bedside Swallow Re- Evaluation Patient Details  Name: [name] MRN: [id number] Date of Birth: 10/21/1928  Today's Date: 02/14/2016 Time: SLP Start Time (ACUTE ONLY): 1152 SLP Stop Time (ACUTE ONLY): 1210 SLP Time Calculation (min) (ACUTE ONLY): 18 min  Past Medical History:  Past Medical History  Diagnosis Date  . COPD (chronic obstructive pulmonary disease) (HCC)   . Hypertension   . Blind   . Hypothyroid   . Pacemaker   . Renal insufficiency   . Finger fracture   . Dementia   . Bipolar disorder (HCC)   . Dysphagia     needs thick nectar liquids  . Atrial fibrillation (HCC)   . Pneumonia   . Anxiety   . Diastolic heart failure [facility](HCC)    Past Surgical History:  Past Surgical History  Procedure Laterality Date  . Insert / replace / remove pacemaker    . Tracheostomy  02/27/13    decannulated 03/29/13  . Gastrostomy tube placement  03/11/13   HPI:  82 year old resident of SNF with history of COPD, bipolar disorder, dementia, hypothyroidism, diastolic heart failure, dysphagia and PEG, hypertension, dementia, admitted with severe sepsis. Likely source pneumonia, urine. Initial CXR with possible RLL infiltrate, but most recent CXR 4/1 shows stable left basilar opacity most consistent with subsegmental atelectasis. Pt had most recent MBS in 07/2013 recommending Dys 1 diet and thin liquids, an advancement from nectar thick liquids. At that time, pt had been decannulated from prior trach but had PEG due to poor oral intake. Most recent swallow evaluation at bedside in 2015 recommending Dys 1 diet and nectar thick liquids due to concern for silent aspiration post-extubation.   Assessment / Plan / Recommendation Clinical Impression  Clinical swallowing evaluation was completed.  The patient has a known history of dysphagia from previous admissions.  Today the patient was more alert and attempted to follow directions of SLP but was generally not successful, therefore oral  mechanism exam was not completed.  Nursing reported that the patient has been asking for ice cream and appeared to be tolerating ice chips overnight.  when presented with PO's the patient takes very small bites/sips and it did not appear that she would eat much at one sitting.  She presented with oropharyngeal dysphagia characterized by mildly delayed oral transit and mildly delayed swallow trigger.  No changes to her vitals were noted with intake.  Recommend begin a dysphagia 1 diet with thin liquids.  Patient should only be fed if she is able to fully rouse.  ST will continue to follow.    Aspiration Risk  Moderate aspiration risk    Diet Recommendation   Dysphagia 1 with thin liquids - only if the patient is alert.  Medication Administration: Crushed with puree    Other  Recommendations Oral Care Recommendations: Oral care QID Other Recommendations: Have oral suction available   Follow up Recommendations   (to be determined)    Frequency and Duration min 2x/week  2 weeks       Prognosis Prognosis for Safe Diet Advancement: Good Barriers to Reach Goals: Other (Comment) (history of dysphagia)      Swallow Study   General Date of Onset: 02/12/16 HPI: 82 year old resident of SNF with history of COPD, bipolar disorder, dementia, hypothyroidism, diastolic heart failure, dysphagia and PEG, hypertension, dementia, admitted with severe sepsis. Likely source pneumonia, urine. Initial CXR with possible RLL infiltrate, but most recent CXR 4/1 shows stable left basilar opacity most consistent with subsegmental atelectasis. Pt had most recent  MBS in 07/2013 recommending Dys 1 diet and thin liquids, an advancement from nectar thick liquids. At that time, pt had been decannulated from prior trach but had PEG due to poor oral intake. Most recent swallow evaluation at bedside in 2015 recommending Dys 1 diet and nectar thick liquids due to concern for silent aspiration post-extubation. Type of Study:  Bedside Swallow Evaluation Previous Swallow Assessment: see HPI Diet Prior to this Study: NPO Temperature Spikes Noted: Yes (Initially on admission - afebrile since 02/12/16.) Respiratory Status: Nasal cannula History of Recent Intubation: No Behavior/Cognition: Lethargic/Drowsy Oral Cavity Assessment: Within Functional Limits (given limited visual inspection) Oral Care Completed by SLP: No Oral Cavity - Dentition:  (Unknown) Self-Feeding Abilities: Total assist Patient Positioning: Upright in bed;Partially reclined Baseline Vocal Quality: Normal Volitional Cough: Weak Volitional Swallow: Unable to elicit    Oral/Motor/Sensory Function Overall Oral Motor/Sensory Function: Other (comment) (Unable to assess)   Ice Chips Ice chips: Impaired Presentation: Spoon Oral Phase Impairments: Poor awareness of bolus;Impaired mastication Oral Phase Functional Implications: Oral holding;Prolonged oral transit   Thin Liquid Thin Liquid: Impaired Presentation: Spoon;Cup Oral Phase Functional Implications: Right anterior spillage (appeared due to head posture) Pharyngeal  Phase Impairments: Suspected delayed Swallow    Nectar Thick Nectar Thick Liquid: Not tested   Honey Thick Honey Thick Liquid: Not tested   Puree Puree: Impaired Presentation: Spoon Oral Phase Impairments: Impaired mastication Oral Phase Functional Implications: Prolonged oral transit Pharyngeal Phase Impairments: Suspected delayed Swallow   Solid   GO   Solid: Not tested    Functional Assessment Tool Used: ASHA NOMS and clinical judgment.   Functional Limitations: Swallowing Swallow Current Status [phone]): At least 40 percent but less than 60 percent impaired, limited or restricted Swallow Goal Status [id number]): At least 40 percent but less than 60 percent impaired, limited or restricted   Dimas Aguas, MA, CCC-SLP Acute Rehab SLP [phone] Fleet Contras 02/14/2016,12:21 PM

## 2016-02-14 NOTE — Progress Notes (Signed)
Pharmacy Antibiotic Note  Melanie OmsClare Cummings is a [age] y.o. female admitted on 02/12/2016 with UTI/HCAP.  Pharmacy has been consulted for Cefepime. Pt is hypthermic, WBC is WNL and Scr is WNL. Blood cultures reported with 1/2 coag negative staph. This is a likely contaminant but cultures have been repeated.   Plan: - Change cefepime to 1gm IV Q8H - borderline dose, will need to watch renal fxn closely - F/u renal fxn, C&S, clinical status and LOT - F/u new blood cultures  Temp (24hrs), Avg:97.5 F (36.4 C), Min:96.7 F (35.9 C), Max:97.8 F (36.6 C)   Recent Labs Lab 02/12/16 1440 02/12/16 1506 02/12/16 2123 02/13/16 0105 02/13/16 0445 02/14/16 0515  WBC 10.0  --   --   --  6.8 6.6  CREATININE 1.40*  --   --   --  0.73 0.58  LATICACIDVEN  --  5.11* 1.87 1.7  --   --     Estimated Creatinine Clearance: 51.6 mL/min (by C-G formula based on Cr of 0.58).    No Known Allergies  Antimicrobials this admission: 4/1 Cefepime>> 3/31 Ceftriaxone x1 3/31 Vancomycin >>4/2  Lysle Pearlachel Mileigh Tilley, PharmD, BCPS Pager # [phone] 02/14/2016 12:00 PM

## 2016-02-14 NOTE — Progress Notes (Signed)
eLink Physician-Brief Progress Note Patient Name: Melanie OmsClare Lovings DOB: 10/26/1928 MRN: [id number]   Date of Service  02/14/2016  HPI/Events of Note  Made aware of decrease in hemoglobin from 13.6 on admission to 9.4. Spoke with bedside nurse who reports patient received 3+ liter of fluid resuscitation IV. Also lines have decreased which could represent dilution. Nurse reports patient only complaining of slight insomnia but no pain.  eICU Interventions  Checking CBC, coags, & type and screen in a.m.     Intervention Category Major Interventions: Shock - evaluation and management  Lawanda CousinsJennings Nickalos Petersen 02/14/2016, 12:52 AM

## 2016-02-14 NOTE — Progress Notes (Signed)
PULMONARY / CRITICAL CARE MEDICINE   Name: [name] MRN: [id number] DOB: 04/13/1928    ADMISSION DATE:  02/12/2016 CONSULTATION DATE:  02/12/16  REFERRING MD:  ED  CHIEF COMPLAINT:  Hypotension, sepsis  HISTORY OF PRESENT ILLNESS:  82 year old resident of SNF with history of COPD, bipolar disorder, dementia, hypothyroidism, diastolic heart failure, dysphagia and PEG, hypertension. She is brought today from the ED with altered mental status, fevers, sweats. And a temperature of 101, hypotensive with elevated lactic acid of 5, Cr of 1.4. PCCM called to admit.  SUBJECTIVE:  No new changes; weaning pressors  VITAL SIGNS: BP 125/76 mmHg  Pulse 59  Temp(Src) 96.7 F (35.9 C) (Axillary)  Resp 16  Wt 161 lb 13.1 oz (73.4 kg)  SpO2 100%  HEMODYNAMICS: CVP:  [3 mmHg-49 mmHg] 28 mmHg VENTILATOR SETTINGS: Vent Mode:  [-]  FiO2 (%):  [55 %] 55 %  INTAKE / OUTPUT: I/O last 3 completed shifts: In: 2446.7 [I.V.:2096.7; IV Piggyback:350] Out: 253 [Urine:253]  PHYSICAL EXAMINATION: General: slow to awaken, c/o pain all over when she does awaken, Very hard of hearing  Neuro:  Moves all 4 extremities, no focal deficits HEENT:  Dry mucous membranes, this has improved.  no JVD, thyromegaly Cardiovascular:  Regular rate and rhythm, no MRG Lungs:  Scattered rhonchi, course cough, no accessory use  Abdomen:  Soft, nontender, nondistended Musculoskeletal:  Normal bulk and tone Skin:  Intact  LABS:  BMET  Recent Labs Lab 02/12/16 1440 02/13/16 0445 02/14/16 0515  NA 148* 145 135  K 4.6 3.8 3.4*  CL 106 114* 103  CO2 BUN 50* 32* 12  CREATININE 1.40* 0.73 0.58  GLUCOSE 163* 93 175*    Electrolytes  Recent Labs Lab 02/12/16 1440 02/13/16 0445 02/14/16 0515  CALCIUM 8.9 7.1* 7.9*  MG  --  2.0  --   PHOS  --  2.7  --    CBC  Recent Labs Lab 02/12/16 1440 02/13/16 0445 02/14/16 0515  WBC 10.0 6.8 6.6  HGB 13.6 9.4* 9.3*  HCT 44.2 29.6* 29.4*  PLT 205  131* 124*   Coag's  Recent Labs Lab 02/12/16 1641 02/13/16 0445 02/14/16 0515  APTT 31  --  33  INR 1.12 1.13 1.02   Sepsis Markers  Recent Labs Lab 02/12/16 1506  02/12/16 2109 02/12/16 2123 02/13/16 0105 02/13/16 0445 02/14/16 0515  LATICACIDVEN 5.11*  --   --  1.87 1.7  --   --   PROCALCITON  --   < > 0.32  --   --  0.28 0.20  < > = values in this interval not displayed. ABG  Recent Labs Lab 02/12/16 1645 02/13/16 0525  PHART 7.400 7.412  PCO2ART 40.4 38.6  PO2ART 170.0* 93.0   Liver Enzymes  Recent Labs Lab 02/12/16 1440 02/13/16 0445  AST 27 17  ALT 12* 9*  ALKPHOS 79 50  BILITOT 0.5 0.4  ALBUMIN 2.8* 2.0*    Cardiac Enzymes  Recent Labs Lab 02/12/16 2109 02/13/16 0040 02/13/16 0930  TROPONINI 1.85* 0.30* 0.14*   Glucose  Recent Labs Lab 02/13/16 2336 02/13/16 2339 02/14/16 0423 02/14/16 0515 02/14/16 0519 02/14/16 0858  GLUCAP 36* 75 65 44* 160* 99    Imaging No results found.left LL airspace disease.    STUDIES:  CXR 3/31 > right base opacity  CULTURES: Bcx 3/31 > 1/2 coag neg SA Ucx 3/31 >  ANTIBIOTICS: Vanco 3/31 >4/2 Cefepime 3/31 >  SIGNIFICANT EVENTS:  LINES/TUBES:  DISCUSSION: 82 year old SNF resident with multiple medical problems. Admitted with severe sepsis. Likely source pneumonia, urine. No growth to date. Did have a blood culture that was positive for Coag neg SA, but this was likely a contaminant. In setting of dementia I worry about aspiration. She is now off pressors. Goals for today are to mobilize, await SLP eval and cont current abx. Have requested repeat blood cultures. She is ready to move to the SDU.   ASSESSMENT / PLAN:  PULMONARY A: COPD Pneumonia P:   Wean FIO2 pulm hygiene SLP eval for swallowing  Repeat CXR in am   CARDIOVASCULAR A:  Hypotension secondary to sepsis. Demand ischemia  Elevated LA-->cleared P:  Cont tele Cont IVFs  RENAL A:   AKI- Secondary to sepsis, ATN,  prerenal-->resolved Lactic acidosis-->resolved Hyperchloremia-->improved  Hypokalemia   P:   Changed to D51/2 ns-->decrease rate  Replace K  F/u am chemistry    GASTROINTESTINAL A:   No issues P:   Keep NPO. SLP eval-->r/o dysphagia   HEMATOLOGIC A:   Anemia. Hgb dropped from 13.6 to 9.3 since admit. Suspect that this reflected hemoconcentration initially and now s/p volume she is leveling out.  -->suspect  P:  Monitor Transfuse per protocol for hgb <7  INFECTIOUS A:   Sepsis secondary to pneumonia, UTI Influenza and U strep negative Coag neg SA in 1/2 blood cultures P:   Dc vanc Cont cefepime F/u Urine legionella  Procalcitonin protocol Repeat BCX 2 4/2  ENDOCRINE A:    Stable P:   SSI  NEUROLOGIC A:   Altered mental status. Likely secondary to sepsis, encephalopathy. Possible underlying dementia Severe deconditioning  P:   Cont supportive care PT consult  FAMILY  - Updates: No family at bedside but as per ED they want everything done. Unable to reach them. Will need to readdress in AM. - Inter-disciplinary family meet or Palliative Care meeting due by:  4/7  Simonne MartinetPeter E Babcock ACNP-BC [facility] Pulmonary/Critical Care Pager # [phone] OR # [phone] if no answer  Attending Note:  82 year old female with dementia with aspiration pneumonia and septic shock. Patient has dysphagia history. On exam, coarse BS diffusely but gag is intact. I reviewed CXR myself, LLL opacity noted. This is likely to be an ongoing issue due to dysphagia, pneumonia and body habitus. There is no family available but need to have EOL discussion as I do not foresee this issue addressed, continue abx. In the meantime, continue to titrate Neo down as able, if off then will [facility] SDU [facility] off 4/2.  The patient is critically ill with multiple organ systems failure and requires high complexity decision making for assessment and support, frequent evaluation  and titration of therapies, application of advanced monitoring technologies and extensive interpretation of multiple databases.   Critical Care Time devoted to patient care services described in this note is  35  Minutes. This time reflects time of care of this signee Dr Koren BoundWesam Yacoub. This critical care time does not reflect procedure time, or teaching time or supervisory time of PA/NP/Med student/Med Resident etc but could involve care discussion time.  Alyson ReedyWesam G. Yacoub, M.D. [facility] Pulmonary/Critical Care Medicine. Pager: [phone]. After hours pager: [phone].

## 2016-02-15 ENCOUNTER — Inpatient Hospital Stay (HOSPITAL_COMMUNITY): Payer: Medicare Other

## 2016-02-15 LAB — CULTURE, BLOOD (ROUTINE X 2)

## 2016-02-15 LAB — LEGIONELLA PNEUMOPHILA SEROGP 1 UR AG: L. PNEUMOPHILA SEROGP 1 UR AG: NEGATIVE

## 2016-02-15 LAB — GLUCOSE, CAPILLARY
GLUCOSE-CAPILLARY: 117 mg/dL — AB (ref 65–99)
GLUCOSE-CAPILLARY: 94 mg/dL (ref 65–99)
Glucose-Capillary: 107 mg/dL — ABNORMAL HIGH (ref 65–99)
Glucose-Capillary: 36 mg/dL — CL (ref 65–99)
Glucose-Capillary: 76 mg/dL (ref 65–99)
Glucose-Capillary: 91 mg/dL (ref 65–99)
Glucose-Capillary: 98 mg/dL (ref 65–99)

## 2016-02-15 MED ORDER — VITAL 1.5 CAL PO LIQD
1000.0000 mL | ORAL | Status: DC
  Administered 2016-02-15 – 2016-02-16 (×2): 1000 mL
  Filled 2016-02-15 (×3): qty 1000

## 2016-02-15 MED ORDER — FREE WATER
100.0000 mL | Freq: Three times a day (TID) | Status: DC
  Administered 2016-02-15 – 2016-02-18 (×9): 100 mL

## 2016-02-15 MED ORDER — SODIUM CHLORIDE 0.9 % IV SOLN
1.5000 g | Freq: Four times a day (QID) | INTRAVENOUS | Status: DC
  Administered 2016-02-15 – 2016-02-18 (×10): 1.5 g via INTRAVENOUS
  Filled 2016-02-15 (×16): qty 1.5

## 2016-02-15 NOTE — Progress Notes (Signed)
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  Melanie DineMargaret Astoria Condon RN BSN Infection Prevention

## 2016-02-15 NOTE — Progress Notes (Signed)
Palliative consult received, chart reviewed Patient seen and examined in medical ICU.  Discussed with patient and bedside RN  Call placed and discussed with son [name] at [phone].  Family meeting to discuss goals of care, scope of treatment, code status etc scheduled for 02-16-16 at 10 am based on son's availability.  Full note and recommendations to follow Thank you for the consult.   Rosalin HawkingZeba Jiana Lemaire MD [facility] palliative medicine team [phone] office [phone] pager.

## 2016-02-15 NOTE — Progress Notes (Signed)
Chaplain has put Ms. Logue on our catholic list and there should be a minister seeing her today to provide prayer and give communion   Thanks  Eli Lilly and CompanyBeatrice Lithzy Bernard

## 2016-02-15 NOTE — Progress Notes (Signed)
HISTORY OF PRESENT ILLNESS:  82 y/o ? Mod-severe COPD CHF, last echo 60-65% grade 1 DD Dementia Hypothyroid S/p PPM BIpolar-prior on Depakote Fall with small parenchymal bleed 08/17/12 Afib on no AC Prior ESBL on 05/2014 admission to PCCM [toxic prodrome, aspiration-at that d/c was sent to [facility] and felt that goals of care should be persued] Prior intubation on admit 02/2013-had a trach placed that time as well. PEG tube in situ sicne then  Admitted 02/12/16 with ED with altered mental status, fevers, sweats.   temperature of 101, hypotensive with elevated lactic acid of 5, Cr of 1.4.  PCCM called to admit and transferred to hospitalist service on 02/15/2016.  SUBJECTIVE:  No new changes; weaning pressors  VITAL SIGNS: BP 128/85 mmHg  Pulse 70  Temp(Src) 97.2 F (36.2 C) (Axillary)  Resp 25  Wt 73.6 kg (162 lb 4.1 oz)  SpO2 95%  HEMODYNAMICS:   VENTILATOR SETTINGS:    INTAKE / OUTPUT: I/O last 3 completed shifts: In: 3131.3 [I.V.:2581.3; IV Piggyback:550] Out: 196 [Urine:196]  PHYSICAL EXAMINATION: General: slow to awaken, oriented and talking clearly Neuro:  Moves all 4 extremities, no focal deficits HEENT:  Dry mucous membranes, this has improved.  no JVD, thyromegaly Cardiovascular:  Regular rate and rhythm, no MRG Lungs:  Scattered rhonchi, course cough, no accessory use  Abdomen:  Soft, nontender, nondistended Musculoskeletal:  Normal bulk and tone Skin:  Intact --wet because of rectocele as well as urine  LABS:  BMET  Recent Labs Lab 02/12/16 1440 02/13/16 0445 02/14/16 0515  NA 148* 145 135  K 4.6 3.8 3.4*  CL 106 114* 103  CO2 BUN 50* 32* 12  CREATININE 1.40* 0.73 0.58  GLUCOSE 163* 93 175*    Electrolytes  Recent Labs Lab 02/12/16 1440 02/13/16 0445 02/14/16 0515  CALCIUM 8.9 7.1* 7.9*  MG  --  2.0  --   PHOS  --  2.7  --    CBC  Recent Labs Lab 02/12/16 1440 02/13/16 0445 02/14/16 0515  WBC 10.0 6.8 6.6  HGB 13.6  9.4* 9.3*  HCT 44.2 29.6* 29.4*  PLT 205 131* 124*   Coag's  Recent Labs Lab 02/12/16 1641 02/13/16 0445 02/14/16 0515  APTT 31  --  33  INR 1.12 1.13 1.02   Sepsis Markers  Recent Labs Lab 02/12/16 1506  02/12/16 2109 02/12/16 2123 02/13/16 0105 02/13/16 0445 02/14/16 0515  LATICACIDVEN 5.11*  --   --  1.87 1.7  --   --   PROCALCITON  --   < > 0.32  --   --  0.28 0.20  < > = values in this interval not displayed. ABG  Recent Labs Lab 02/12/16 1645 02/13/16 0525  PHART 7.400 7.412  PCO2ART 40.4 38.6  PO2ART 170.0* 93.0   Liver Enzymes  Recent Labs Lab 02/12/16 1440 02/13/16 0445  AST 27 17  ALT 12* 9*  ALKPHOS 79 50  BILITOT 0.5 0.4  ALBUMIN 2.8* 2.0*    Cardiac Enzymes  Recent Labs Lab 02/12/16 2109 02/13/16 0040 02/13/16 0930  TROPONINI 1.85* 0.30* 0.14*   Glucose  Recent Labs Lab 02/14/16 1909 02/14/16 2335 02/15/16 0423 02/15/16 0852 02/15/16 1239 02/15/16 1554  GLUCAP 92 91 98 107* 76 117*    Imaging Dg Chest Port 1 View  02/15/2016  CLINICAL DATA:  Pneumonia. EXAM: PORTABLE CHEST 1 VIEW COMPARISON:  02/13/2016. FINDINGS: Patient is rotated to the right. Cardiac pacer noted with lead tips in stable  position. Heart size normal. Left lower lobe subsegmental atelectasis and or infiltrate. Tiny left pleural effusion cannot be excluded. No pneumothorax . IMPRESSION: 1. Mild left lower lobe subsegmental atelectasis and or infiltrate. Small left pleural effusion. No interim change. 2.  Cardiac pacer in stable position.  Heart size normal. Electronically Signed   By: Maisie Fushomas  Register   On: 02/15/2016 07:30  left LL airspace disease.    STUDIES:  CXR 3/31 > right base opacity  CULTURES: Bcx 3/31 > 1/2 coag neg SA Ucx 3/31 >  ANTIBIOTICS: Vanco 3/31 >4/2 Cefepime 3/31 >  SIGNIFICANT EVENTS:  LINES/TUBES:  DISCUSSION: 82 year old SNF resident with multiple medical problems. Admitted with severe sepsis. Likely source pneumonia,  urine. No growth to date. Did have a blood culture that was positive for Coag neg SA, but this was likely a contaminant. In setting of dementia I worry about aspiration. She is now off pressors. Goals for today are to mobilize, await SLP eval and cont current abx.   ASSESSMENT / PLAN:  PULMONARY A: COPD Pneumonia P:   Wean FIO2 pulm hygiene SLP eval for swallowing --recommending nothing by mouth status for now and they will reassess in a.m. 02/15/2016 chest x-ray shows infiltrate left lower lobe although patient has left-sided pneumonia, favor aspiration given history of PEG.  CARDIOVASCULAR A:  Hypotension secondary to sepsis. Demand ischemia  Elevated LA-->cleared P:  Cont tele Cont IVFs  RENAL A:   AKI- Secondary to sepsis, ATN, prerenal-->resolved Lactic acidosis-->resolved Hyperchloremia-->improved  Hypokalemia   P:   Changed to D51/2 ns-->decrease rate , currently on 50 cc per hour  Replace K  slowly  F/u am labs  GASTROINTESTINAL A:   No issues P:   Keep NPO. SLP eva as above  HEMATOLOGIC A:   Anemia. Hgb dropped from 13.6 to 9.3 since admit. Was hemoconcentration initially and now s/p volume she is leveling out.  -->suspect  P:  Monitor Transfuse per protocol for hgb <7   INFECTIOUS A:   Sepsis secondary to pneumonia, UTI Influenza and U strep negative Coag neg SA in 1/2 blood cultures P:   Dc vanc F/u Urine legionella  Procalcitonin protocol Repeat BCX 2 4/2 are pending Place Foley catheter for comfort given incontinence   ENDOCRINE A:    Stable P:   SSI  NEUROLOGIC A:   Altered mental status. Likely secondary to sepsis, encephalopathy. Possible underlying dementia Severe deconditioning  P:   Cont supportive care PT consult  FAMILY  - Updates:\ Long discussion with daughter who is a Engineer, civil (consulting)nurse on the telephone. Goals of care set up tomorrow with patient's son who is the actual health care power of attorney I feel there is some  futility if we continue to do full scope of treatment and given multiple episodes of intubation as well as aspiration feel patient may benefit from most form discussions as well as delineation of goals of care.  >45 min

## 2016-02-16 ENCOUNTER — Inpatient Hospital Stay (HOSPITAL_COMMUNITY): Payer: Medicare Other

## 2016-02-16 DIAGNOSIS — Z515 Encounter for palliative care: Secondary | ICD-10-CM | POA: Insufficient documentation

## 2016-02-16 DIAGNOSIS — Z7189 Other specified counseling: Secondary | ICD-10-CM | POA: Insufficient documentation

## 2016-02-16 LAB — CBC WITH DIFFERENTIAL/PLATELET
BASOS ABS: 0 10*3/uL (ref 0.0–0.1)
BASOS PCT: 0 %
EOS PCT: 0 %
Eosinophils Absolute: 0 10*3/uL (ref 0.0–0.7)
HCT: 28.3 % — ABNORMAL LOW (ref 36.0–46.0)
Hemoglobin: 9.3 g/dL — ABNORMAL LOW (ref 12.0–15.0)
Lymphocytes Relative: 7 %
Lymphs Abs: 0.7 10*3/uL (ref 0.7–4.0)
MCH: 32.3 pg (ref 26.0–34.0)
MCHC: 32.9 g/dL (ref 30.0–36.0)
MCV: 98.3 fL (ref 78.0–100.0)
MONO ABS: 0.7 10*3/uL (ref 0.1–1.0)
Monocytes Relative: 6 %
Neutro Abs: 9.5 10*3/uL — ABNORMAL HIGH (ref 1.7–7.7)
Neutrophils Relative %: 87 %
PLATELETS: 170 10*3/uL (ref 150–400)
RBC: 2.88 MIL/uL — ABNORMAL LOW (ref 3.87–5.11)
RDW: 13.3 % (ref 11.5–15.5)
WBC: 10.9 10*3/uL — ABNORMAL HIGH (ref 4.0–10.5)

## 2016-02-16 LAB — GLUCOSE, CAPILLARY
GLUCOSE-CAPILLARY: 108 mg/dL — AB (ref 65–99)
GLUCOSE-CAPILLARY: 128 mg/dL — AB (ref 65–99)
GLUCOSE-CAPILLARY: 96 mg/dL (ref 65–99)
Glucose-Capillary: 111 mg/dL — ABNORMAL HIGH (ref 65–99)
Glucose-Capillary: 121 mg/dL — ABNORMAL HIGH (ref 65–99)
Glucose-Capillary: 56 mg/dL — ABNORMAL LOW (ref 65–99)
Glucose-Capillary: 188 mg/dL — ABNORMAL HIGH (ref 65–99)

## 2016-02-16 LAB — COMPREHENSIVE METABOLIC PANEL
ALBUMIN: 2.2 g/dL — AB (ref 3.5–5.0)
ALT: 17 U/L (ref 14–54)
AST: 23 U/L (ref 15–41)
Alkaline Phosphatase: 64 U/L (ref 38–126)
Anion gap: 11 (ref 5–15)
BUN: 7 mg/dL (ref 6–20)
CHLORIDE: 98 mmol/L — AB (ref 101–111)
CO2: 24 mmol/L (ref 22–32)
Calcium: 8 mg/dL — ABNORMAL LOW (ref 8.9–10.3)
Creatinine, Ser: 0.62 mg/dL (ref 0.44–1.00)
GFR calc Af Amer: >60 mL/min (ref >60)
GFR calc non Af Amer: >60 mL/min (ref >60)
GLUCOSE: 107 mg/dL — AB (ref 65–99)
POTASSIUM: 2.6 mmol/L — AB (ref 3.5–5.1)
Sodium: 133 mmol/L — ABNORMAL LOW (ref 135–145)
Total Bilirubin: 0.4 mg/dL (ref 0.3–1.2)
Total Protein: 5.4 g/dL — ABNORMAL LOW (ref 6.5–8.1)

## 2016-02-16 MED ORDER — DEXTROSE 50 % IV SOLN
INTRAVENOUS | Status: AC
  Administered 2016-02-16: 50 mL
  Filled 2016-02-16: qty 50

## 2016-02-16 MED ORDER — QUETIAPINE FUMARATE 25 MG PO TABS
50.0000 mg | ORAL_TABLET | Freq: Every evening | ORAL | Status: DC
  Administered 2016-02-16 – 2016-02-17 (×2): 50 mg via ORAL
  Filled 2016-02-16: qty 2

## 2016-02-16 MED ORDER — POTASSIUM CHLORIDE 2 MEQ/ML IV SOLN
INTRAVENOUS | Status: DC
  Administered 2016-02-16: 09:00:00 via INTRAVENOUS
  Filled 2016-02-16 (×2): qty 1000

## 2016-02-16 MED ORDER — POTASSIUM CHLORIDE 20 MEQ/15ML (10%) PO SOLN
40.0000 meq | ORAL | Status: AC
  Administered 2016-02-16 (×2): 40 meq via ORAL
  Filled 2016-02-16 (×3): qty 30

## 2016-02-16 MED ORDER — POTASSIUM CHLORIDE 20 MEQ/15ML (10%) PO SOLN
40.0000 meq | Freq: Three times a day (TID) | ORAL | Status: DC
  Administered 2016-02-16 – 2016-02-17 (×4): 40 meq via ORAL
  Filled 2016-02-16 (×5): qty 30

## 2016-02-16 NOTE — Progress Notes (Signed)
HISTORY OF PRESENT ILLNESS:  82 y/o ? Mod-severe COPD CHF, last echo 60-65% grade 1 DD Dementia Hypothyroid S/p PPM BIpolar-prior on Depakote Fall with small parenchymal bleed 08/17/12 Afib on no AC Prior ESBL on 05/2014 admission to PCCM [toxic prodrome, aspiration-at that d/c was sent to [facility] and felt that goals of care should be persued] Prior intubation on admit 02/2013-had a trach placed that time as well. PEG tube in situ sicne then  Admitted 02/12/16 with ED with altered mental status, fevers, sweats.   temperature of 101, hypotensive with elevated lactic acid of 5, Cr of 1.4.  PCCM called to admit and transferred to hospitalist service on 02/15/2016.  SUBJECTIVE:   Alert oriented and in Nad No cp No n/v No SOB Very HOH Nursing reports need  VITAL SIGNS: BP 134/71 mmHg  Pulse 66  Temp(Src) 97.4 F (36.3 C) (Axillary)  Resp 21  Wt 72.1 kg (158 lb 15.2 oz)  SpO2 100%  INTAKE / OUTPUT: I/O last 3 completed shifts: In: 2295.3 [I.V.:1809; NG/GT:236.3; IV Piggyback:250] Out: 350 [Urine:350]  PHYSICAL EXAMINATION: General: slow to awaken, oriented and talking clearly Neuro:  Moves all 4 extremities, no focal deficits HEENT:  Dry mucous membranes, this has improved.  no JVD, thyromegaly--Has prior PEG scar Cardiovascular:  Regular rate and rhythm, no MRG Lungs:  Scattered rhonchi Abdomen:  Soft, nontender, nondistended Musculoskeletal:  Normal bulk and tone Skin:  Intact --wet because of rectocele as well as urine  LABS:  BMET  Recent Labs Lab 02/13/16 0445 02/14/16 0515 02/16/16 0348  NA 145 135 133*  K 3.8 3.4* 2.6*  CL 114* 103 98*  CO2 BUN 32* 12 7  CREATININE 0.73 0.58 0.62  GLUCOSE 93 175* 107*    Electrolytes  Recent Labs Lab 02/13/16 0445 02/14/16 0515 02/16/16 0348  CALCIUM 7.1* 7.9* 8.0*  MG 2.0  --   --   PHOS 2.7  --   --    CBC  Recent Labs Lab 02/13/16 0445 02/14/16 0515 02/16/16 0348  WBC 6.8 6.6 10.9*  HGB  9.4* 9.3* 9.3*  HCT 29.6* 29.4* 28.3*  PLT 131* 124* 170   Coag's  Recent Labs Lab 02/12/16 1641 02/13/16 0445 02/14/16 0515  APTT 31  --  33  INR 1.12 1.13 1.02   Sepsis Markers  Recent Labs Lab 02/12/16 1506  02/12/16 2109 02/12/16 2123 02/13/16 0105 02/13/16 0445 02/14/16 0515  LATICACIDVEN 5.11*  --   --  1.87 1.7  --   --   PROCALCITON  --   < > 0.32  --   --  0.28 0.20  < > = values in this interval not displayed. ABG  Recent Labs Lab 02/12/16 1645 02/13/16 0525  PHART 7.400 7.412  PCO2ART 40.4 38.6  PO2ART 170.0* 93.0   Liver Enzymes  Recent Labs Lab 02/12/16 1440 02/13/16 0445 02/16/16 0348  AST ALT 12* 9* 17  ALKPHOS 79 50 64  BILITOT 0.5 0.4 0.4  ALBUMIN 2.8* 2.0* 2.2*    Cardiac Enzymes  Recent Labs Lab 02/12/16 2109 02/13/16 0040 02/13/16 0930  TROPONINI 1.85* 0.30* 0.14*   Glucose  Recent Labs Lab 02/15/16 1239 02/15/16 1554 02/15/16 2035 02/15/16 2331 02/16/16 0338 02/16/16 0850  GLUCAP 76 117* 94 128* 111* 121*    Imaging No results found.left LL airspace disease.    STUDIES:  CXR 3/31 > right base opacity  CULTURES: Bcx 3/31 > 1/2 coag neg SA  Ucx 3/31 >  ANTIBIOTICS: Vanco 3/31 >4/2 Cefepime 3/31 >  SIGNIFICANT EVENTS:  LINES/TUBES:  DISCUSSION: 82 year old SNF resident with multiple medical problems. Admitted with severe sepsis. Likely source pneumonia, urine. No growth to date. Did have a blood culture that was positive for Coag neg SA, but this was likely a contaminant. In setting of dementia I worry about aspiration. She is now off pressors. Goals for today are to mobilize, await SLP eval and cont current abx.   ASSESSMENT / PLAN:  PULMONARY A: COPD Pneumonia P:   Wean FIO2 pulm hygiene SLP eval for swallowing --recommending nothing by mouth status for now and they will reassess in a.m. 02/15/2016 chest x-ray shows infiltrate left lower lobe although patient has left-sided  pneumonia, favor aspiration given history of PEG.  CARDIOVASCULAR A:  Hypotension secondary to sepsis. Demand ischemia  Elevated LA-->cleared P:  Cont tele Cont IVFs  RENAL A:   AKI- Secondary to sepsis, ATN, prerenal-->resolved Lactic acidosis-->resolved Hyperchloremia-->improved  Hypokalemia Hypokalemia   P:   Changed to D51/2 ns-->decrease rate , currently on 50 cc per hour  Replace K  slowly --Added K to Fluids 02/16/16 Note Net + 6l Supplement via PEG Klor 40 tid and d/c all IVF 10:34 AM F/u am labs  GASTROINTESTINAL A:   No issues P:   Keep NPO. SLP eval Dys 1 diet  HEMATOLOGIC A:   Anemia. Hgb dropped from 13.6 to 9.3 since admit. Was hemoconcentration initially and now s/p volume she is leveling out.  -->suspect  P:  Monitor Transfuse per protocol for hgb <7   INFECTIOUS A:   Sepsis secondary to pneumonia, UTI Influenza and U strep negative Coag neg SA in 1/2 blood cultures P:   Dc vanc F/u Urine legionella  Procalcitonin protocol Repeat BCX 2 4/2 are pending Place Foley catheter for comfort given incontinence   ENDOCRINE A:    Stable P:   SSI  NEUROLOGIC A:   Altered mental status. Likely secondary to sepsis, encephalopathy. Possible underlying dementia Severe deconditioning  P:   Cont supportive care PT consult  FAMILY  - Updates: Long discussion with daughter who is a nurse on the telephone on 02/16/16 . Goals of care set up??? patient's son who is the actual health care power of attorney  I feel there is some futility if we continue to do full scope of treatment and given multiple episodes of intubation as well as aspiration feel patient may benefit from most form discussions as well as delineation of goals of care.  >35 min

## 2016-02-16 NOTE — Care Management Important Message (Signed)
Important Message  Patient Details  Name: Lenon OmsClare Tullos MRN: [id number] Date of Birth: 09/30/1928   Medicare Important Message Given:  Other (see comment)    Shunna Mikaelian Abena 02/16/2016, 11:18 AM

## 2016-02-16 NOTE — Progress Notes (Signed)
Speech Language Pathology Treatment: Dysphagia  Patient Details Name: [name] MRN: [id number] DOB: 08/01/1928 [name]'s Date: 02/16/2016 Time: [id number] SLP Time Calculation (min) (ACUTE ONLY): 8 min  Assessment / Plan / Recommendation Clinical Impression  Pt has very limited oral acceptance of POs offered, taking in one ice chip with minimal active manipulation and small amount of puree with delayed oral transit, suspect delayed swallow, and delayed cough x1. CXR from 4/3 with possible new LLL infiltrate. Given history and current presentation, pt is likely at high risk of aspiration with any PO intake. Note that palliative care meeting is planned for this morning. Will continue to follow after GOC have been determine. Discussed with RN to hold POs if pt is coughing with intake pending family decisions.   HPI HPI: 82 year old resident of SNF with history of COPD, bipolar disorder, dementia, hypothyroidism, diastolic heart failure, dysphagia and PEG, hypertension, dementia, admitted with severe sepsis. Likely source pneumonia, urine. Initial CXR with possible RLL infiltrate, but most recent CXR 4/1 shows stable left basilar opacity most consistent with subsegmental atelectasis. Pt had most recent MBS in 07/2013 recommending Dys 1 diet and thin liquids, an advancement from nectar thick liquids. At that time, pt had been decannulated from prior trach but had PEG due to poor oral intake. Most recent swallow evaluation at bedside in 2015 recommending Dys 1 diet and nectar thick liquids due to concern for silent aspiration post-extubation.      SLP Plan  Continue with current plan of care     Recommendations  Diet recommendations: Dysphagia 1 (puree);Thin liquid Liquids provided via: Cup;Teaspoon Medication Administration: Crushed with puree Supervision: Staff to assist with self feeding;Full supervision/cueing for compensatory strategies Compensations: Slow rate;Small sips/bites;Other (Comment)  (hold POs if coughing, must be fully alert) Postural Changes and/or Swallow Maneuvers: Seated upright 90 degrees             Oral Care Recommendations: Oral care BID Follow up Recommendations:  (tba) Plan: Continue with current plan of care     GO               Maxcine HamLaura Paiewonsky, M.A. CCC-SLP [phone]  Maxcine Hamaiewonsky, Panayiota Larkin 02/16/2016, 9:47 AM

## 2016-02-16 NOTE — Progress Notes (Signed)
[facility]ACEMENT ONLY  The patient does not apply for [facility] Electroly[facility] Protocol based on the criteria listed below:    Is urine output >/= 0.5 ml/kg/hr for the last 6 hours? No. Patient's UOP is 0.2 ml/kg/hr   Abnormal electrolyte(s): K2.6  If a panic level lab has been reported, has the CCM MD in charge been notified? Yes.  Melrose Nakayama.     Blease Capaldi William 02/16/2016 4:44 AM

## 2016-02-16 NOTE — Progress Notes (Signed)
Visited patients room to administer nebulizer.  As I started the tx the patient said I am doing fine and refused to allow me to put tx on her.

## 2016-02-16 NOTE — Progress Notes (Signed)
[facility] CSW will continue to follow for disposition.   Noe GensAshley Gardner, MSW, LCSW [facility] Clinical Social Worker [phone]

## 2016-02-16 NOTE — Consult Note (Signed)
Consultation Note Date: 02/16/2016   Patient Name: [name]  DOB: 06/17/1928  MRN: [id number]  Age / Sex: 82 y.o., female  PCP: Margit HanksAnne D Alexander, MD Referring Physician: Rhetta MuraJai-Gurmukh Samtani, MD  Reason for Consultation: Establishing goals of care    Clinical Assessment/Narrative: 82 year old lady skilled nursing facility resident with multiple medical problems. [facility]. She has been admitted with severe sepsis. She has possible pneumonia, possibly aspiration. Initially she also required pressors. She has underlying chronic obstructive pulmonary disease. She also has history of dementia, hypothyroidism, grade 1 diastolic dysfunction 60-65 percent ejection fraction. 2014 the patient had a trach placed and has had a PEG tube placed since then.  Palliative care consultation for goals of care discussions. Patient seen and examined. She complains of aching all over. Otherwise, she is hard of hearing and does not verbalize much. Call placed and discussed with her son bowel who is her healthcare power of attorney agent.  Discussed extensively about the patient's underlying history, her current acute medical conditions. Goals of care attempted to be elicited. Introduced scope of palliative services. See recommendations below. Palliative will continue to follow along. Thank you for the consult.  Contacts/Participants in Discussion: Primary Decision Maker:    Relationship to Patient  HCPOA: yes    SUMMARY OF RECOMMENDATIONS: Full code Continue current mode of care To return back [facility] after discharge Son does not wish to consider palliative measures at this time    Code Status/Advance Care Planning: Full code    Code Status Orders        Start     Ordered   02/12/16 2056  Full code   Continuous     02/12/16 2055    Code Status History    Date Active Date  Inactive Code Status Order ID Comments User Context   06/13/2014  7:23 AM 07/02/2014  3:40 PM Full Code [id number]  Maren ReamerSarah Yates Inpatient   06/07/2014  5:21 AM 06/11/2014  9:00 PM Full Code [id number]  Otis Dialsharles Austin, MD Inpatient   02/28/2013  6:47 PM 04/02/2013  4:43 PM Full Code [id number]  Wende Bushyonya Hundley Inpatient   08/16/2012  4:33 AM 08/17/2012  9:37 PM Full Code [id number]  Cory RoughenAllison B Dundon, RN Inpatient      Other Directives:Advanced Directive  Symptom Management:    As above   Palliative Prophylaxis:   Delirium Protocol  Additional Recommendations (Limitations, Scope, Preferences):  Full Scope Treatment     Psycho-social/Spiritual:  Support System: Fair Desire for further Chaplaincy support:no Additional Recommendations: Caregiving  Support/Resources  Prognosis: guarded  Discharge Planning: pending hospital course   Chief Complaint/ Primary Diagnoses: Present on Admission:  . Septic shock (HCC) . Dementia with behavioral disturbance . Tardive dyskinesia . Bipolar affective disorder (HCC) . Essential hypertension, benign . COPD (chronic obstructive pulmonary disease) (HCC) . Chronic diastolic heart failure (HCC) . Hypothyroidism due to acquired atrophy of thyroid . Dysphagia, pharyngoesophageal phase . UTI (urinary tract infection) . HCAP (healthcare-associated pneumonia) . AKI (acute kidney injury) (HCC) . Elevated troponin  I have reviewed the medical record, interviewed the patient and family, and examined the patient. The following aspects are pertinent.  Past Medical History  Diagnosis Date  . COPD (chronic obstructive pulmonary disease) (HCC)   . Hypertension   . Blind   . Hypothyroid   . Pacemaker   . Renal insufficiency   . Finger fracture   . Dementia   . Bipolar disorder (HCC)   .  Dysphagia     needs thick nectar liquids  . Atrial fibrillation (HCC)   . Pneumonia   . Anxiety   . Diastolic heart failure [facility])    Social History   Social  History  . Marital Status: Widowed    Spouse Name: N/A  . Number of Children: N/A  . Years of Education: N/A   Social History Main Topics  . Smoking status: Former Smoker -- .5 years    Types: Cigarettes  . Smokeless tobacco: None  . Alcohol Use: None  . Drug Use: No  . Sexual Activity: No   Other Topics Concern  . None   Social History Narrative   Pt lives at [facility]   No family history on file. Scheduled Meds: . ampicillin-sulbactam (UNASYN) IV  1.5 g Intravenous Q6H  . feeding supplement (VITAL 1.5 CAL)  1,000 mL Per Tube Q24H  . free water  100 mL Per Tube 3 times per day  . insulin aspart  0-15 Units Subcutaneous 6 times per day  . ipratropium-albuterol  3 mL Nebulization TID  . levothyroxine  62.5 mcg Intravenous Daily  . polyvinyl alcohol  1 drop Both Eyes BID  . potassium chloride  40 mEq Oral TID  . QUEtiapine  50 mg Oral QPM  . sodium chloride flush  3 mL Intravenous Q12H   Continuous Infusions:  PRN Meds:.acetaminophen **OR** acetaminophen, ipratropium-albuterol Medications Prior to Admission:  Prior to Admission medications   Medication Sig Start Date End Date Taking? Authorizing Provider  beta carotene w/minerals (OCUVITE) tablet Take 1 tablet by mouth daily.   Yes Historical Provider, MD  docusate sodium (COLACE) 100 MG capsule Take 100 mg by mouth daily.   Yes Historical Provider, MD  furosemide (LASIX) 20 MG tablet Take 20 mg by mouth daily.   Yes Historical Provider, MD  ipratropium-albuterol (DUONEB) 0.5-2.5 (3) MG/3ML SOLN Take 3 mLs by nebulization 3 (three) times daily.   Yes Historical Provider, MD  lidocaine (LIDODERM) 5 % Place 1 patch onto the skin 2 (two) times daily. Apply 1/2 patch to each shoulder and to each knee for 12 hours, then remove   Yes Historical Provider, MD  meloxicam (MOBIC) 15 MG tablet Take 15 mg by mouth daily.   Yes Historical Provider, MD  Memantine HCl-Donepezil HCl (NAMZARIC) 28-10 MG CP24 Take 1 capsule  by mouth daily.   Yes Historical Provider, MD  Multiple Vitamin (MULTIVITAMIN WITH MINERALS) TABS tablet Take 1 tablet by mouth daily.   Yes Historical Provider, MD  oxyCODONE (OXY IR/ROXICODONE) 5 MG immediate release tablet Take one tablet by mouth every 4 hours for pain Patient taking differently: Take 5 mg by mouth See admin instructions. Take one tablet by mouth every 4 hours for pain 09/24/15  Yes Sharon Seller, NP  polyethylene glycol (MIRALAX / GLYCOLAX) packet Take 17 g by mouth daily.   Yes Historical Provider, MD  Propylene Glycol (SYSTANE BALANCE) 0.6 % SOLN Place 1 drop into both eyes 2 (two) times daily.    Yes Historical Provider, MD  QUEtiapine (SEROQUEL) 50 MG tablet Take 50 mg by mouth at bedtime.   Yes Historical Provider, MD  QUEtiapine (SEROQUEL) 50 MG tablet Take 50 mg by mouth every evening.   Yes Historical Provider, MD  Valproic Acid 500 MG CPDR Take 500 mg by mouth 2 (two) times daily.   Yes Historical Provider, MD  DULoxetine (CYMBALTA) 60 MG capsule Take 60 mg by mouth daily.  Historical Provider, MD  famotidine (PEPCID) 20 MG tablet Take 1 tablet (20 mg total) by mouth 2 (two) times daily. 06/11/14   Bernadene Person, NP  levothyroxine (SYNTHROID, LEVOTHROID) 125 MCG tablet Take 125 mcg by mouth daily before breakfast. Reported on 02/12/2016    Historical Provider, MD  Nutritional Supplements (FEEDING SUPPLEMENT, OSMOLITE 1.5 CAL,) LIQD Place 60 mLs into feeding tube 2 (two) times daily. Reported on 02/12/2016    Historical Provider, MD  OXYGEN Inhale 2 Imperial Gallon into the lungs continuous. Reported on 02/12/2016    Historical Provider, MD  UNABLE TO FIND Reported on 02/12/2016    Historical Provider, MD   No Known Allergies  Review of Systems Complains of pain all over.   Physical Exam Weak frail elderly lady  Opens eyes, appears chronically ill Trach scar on neck Scattered rhonchi Abdomen soft peg tube Trace edema  Vital Signs: BP 123/68 mmHg   Pulse 74  Temp(Src) 97.4 F (36.3 C) (Axillary)  Resp 18  Wt 72.1 kg (158 lb 15.2 oz)  SpO2 91%  SpO2: SpO2: 91 % O2 Device:SpO2: 91 % O2 Flow Rate: .O2 Flow Rate (L/min): 1 L/min  IO: Intake/output summary:  Intake/Output Summary (Last 24 hours) at 02/16/16 1639 Last data filed at 02/16/16 1600  Gross per 24 hour  Intake 1714.33 ml  Output    761 ml  Net 953.33 ml    LBM: Last BM Date: 02/15/16 Baseline Weight: Weight: 73.4 kg (161 lb 13.1 oz) Most recent weight: Weight: 72.1 kg (158 lb 15.2 oz)      Palliative Assessment/Data:  Flowsheet Rows        Most Recent Value   Intake Tab    Referral Department  Hospitalist   Unit at Time of Referral  ICU   Palliative Care Primary Diagnosis  Sepsis/Infectious Disease   Date Notified  02/15/16   Palliative Care Type  New Palliative care   Reason for referral  Clarify Goals of Care   Date of Admission  02/12/16   Date first seen by Palliative Care  02/16/16   # of days Palliative referral response time  1 Day(s)   # of days IP prior to Palliative referral  3   Clinical Assessment    Palliative Performance Scale Score  20%   Pain Max last 24 hours  5   Pain Min Last 24 hours  4   Dyspnea Max Last 24 Hours  4   Dyspnea Min Last 24 hours  3   Psychosocial & Spiritual Assessment    Palliative Care Outcomes    Patient/Family meeting held?  Yes   Who was at the meeting?  son beau   Palliative Care follow-up planned  Yes, Facility      Additional Data Reviewed:  CBC:    Component Value Date/Time   WBC 10.9* 02/16/2016 0348   WBC 6.2 12/30/2015   HGB 9.3* 02/16/2016 0348   HCT 28.3* 02/16/2016 0348   PLT 170 02/16/2016 0348   MCV 98.3 02/16/2016 0348   NEUTROABS 9.5* 02/16/2016 0348   LYMPHSABS 0.7 02/16/2016 0348   MONOABS 0.7 02/16/2016 0348   EOSABS 0.0 02/16/2016 0348   BASOSABS 0.0 02/16/2016 0348   Comprehensive Metabolic Panel:    Component Value Date/Time   NA 133* 02/16/2016 0348   NA 146 12/30/2015    K 2.6* 02/16/2016 0348   CL 98* 02/16/2016 0348   CO2 24 02/16/2016 0348   BUN 7 02/16/2016 0348  BUN 44* 12/30/2015   CREATININE 0.62 02/16/2016 0348   CREATININE 0.9 12/30/2015   GLUCOSE 107* 02/16/2016 0348   CALCIUM 8.0* 02/16/2016 0348   AST 23 02/16/2016 0348   ALT 17 02/16/2016 0348   ALKPHOS 64 02/16/2016 0348   BILITOT 0.4 02/16/2016 0348   PROT 5.4* 02/16/2016 0348   ALBUMIN 2.2* 02/16/2016 0348     Time In: 10 Time Out: 11 Time Total: 60 Greater than 50%  of this time was spent counseling and coordinating care related to the above assessment and plan.  Signed by: Rosalin Hawking, MD [id number] Rosalin Hawking, MD  02/16/2016, 4:39 PM  Please contact Palliative Medicine Team phone at [phone] for questions and concerns.

## 2016-02-16 NOTE — Progress Notes (Addendum)
CRITICAL VALUE ALERT  Critical value received:  Potassium 2.6  Date of notification: 02/16/16  Time of notification:  0435  Critical value read back: yes  Nurse who received alert: Julius BowelsFelicia Javel Hersh, RN  Dr. Craige CottaKirby [phone]

## 2016-02-17 DIAGNOSIS — F3175 Bipolar disorder, in partial remission, most recent episode depressed: Secondary | ICD-10-CM

## 2016-02-17 LAB — CBC
HEMATOCRIT: 26.6 % — AB (ref 36.0–46.0)
HEMOGLOBIN: 9 g/dL — AB (ref 12.0–15.0)
MCH: 34.1 pg — AB (ref 26.0–34.0)
MCHC: 33.8 g/dL (ref 30.0–36.0)
MCV: 100.8 fL — ABNORMAL HIGH (ref 78.0–100.0)
Platelets: 174 10*3/uL (ref 150–400)
RBC: 2.64 MIL/uL — AB (ref 3.87–5.11)
RDW: 14.2 % (ref 11.5–15.5)
WBC: 10.1 10*3/uL (ref 4.0–10.5)

## 2016-02-17 LAB — CULTURE, BLOOD (ROUTINE X 2): Culture: NO GROWTH

## 2016-02-17 LAB — COMPREHENSIVE METABOLIC PANEL
ALBUMIN: 2.1 g/dL — AB (ref 3.5–5.0)
ALT: 17 U/L (ref 14–54)
AST: 25 U/L (ref 15–41)
Alkaline Phosphatase: 62 U/L (ref 38–126)
Anion gap: 10 (ref 5–15)
BUN: 10 mg/dL (ref 6–20)
CO2: 23 mmol/L (ref 22–32)
CREATININE: 0.69 mg/dL (ref 0.44–1.00)
Calcium: 8.6 mg/dL — ABNORMAL LOW (ref 8.9–10.3)
Chloride: 108 mmol/L (ref 101–111)
GFR calc Af Amer: >60 mL/min (ref >60)
GFR calc non Af Amer: >60 mL/min (ref >60)
GLUCOSE: 111 mg/dL — AB (ref 65–99)
POTASSIUM: 4.4 mmol/L (ref 3.5–5.1)
SODIUM: 141 mmol/L (ref 135–145)
Total Bilirubin: 0.5 mg/dL (ref 0.3–1.2)
Total Protein: 5.2 g/dL — ABNORMAL LOW (ref 6.5–8.1)

## 2016-02-17 LAB — GLUCOSE, CAPILLARY
GLUCOSE-CAPILLARY: 116 mg/dL — AB (ref 65–99)
GLUCOSE-CAPILLARY: 120 mg/dL — AB (ref 65–99)
GLUCOSE-CAPILLARY: 120 mg/dL — AB (ref 65–99)
Glucose-Capillary: 117 mg/dL — ABNORMAL HIGH (ref 65–99)
Glucose-Capillary: 103 mg/dL — ABNORMAL HIGH (ref 65–99)
Glucose-Capillary: 108 mg/dL — ABNORMAL HIGH (ref 65–99)

## 2016-02-17 LAB — PROTIME-INR
INR: 1.08 (ref 0.00–1.49)
PROTHROMBIN TIME: 14.2 s (ref 11.6–15.2)

## 2016-02-17 MED ORDER — DONEPEZIL HCL 10 MG PO TABS
10.0000 mg | ORAL_TABLET | Freq: Every day | ORAL | Status: DC
  Administered 2016-02-17: 10 mg via ORAL
  Filled 2016-02-17: qty 1

## 2016-02-17 MED ORDER — OSMOLITE 1.5 CAL PO LIQD
1000.0000 mL | ORAL | Status: DC
  Administered 2016-02-17: 1000 mL
  Filled 2016-02-17 (×2): qty 1000

## 2016-02-17 MED ORDER — MEMANTINE HCL-DONEPEZIL HCL ER 28-10 MG PO CP24: 1.0000 | ORAL_CAPSULE | Freq: Every day | ORAL | Status: DC

## 2016-02-17 MED ORDER — MEMANTINE HCL ER 28 MG PO CP24
28.0000 mg | ORAL_CAPSULE | Freq: Every day | ORAL | Status: DC
  Administered 2016-02-17: 28 mg via ORAL
  Filled 2016-02-17 (×2): qty 1

## 2016-02-17 MED ORDER — SODIUM CHLORIDE 0.9% FLUSH
10.0000 mL | INTRAVENOUS | Status: DC | PRN
  Administered 2016-02-18: 10 mL
  Filled 2016-02-17: qty 40

## 2016-02-17 MED ORDER — DULOXETINE HCL 60 MG PO CPEP
60.0000 mg | ORAL_CAPSULE | Freq: Every day | ORAL | Status: DC
  Administered 2016-02-17: 60 mg via ORAL

## 2016-02-17 MED ORDER — FAMOTIDINE 20 MG PO TABS
20.0000 mg | ORAL_TABLET | Freq: Two times a day (BID) | ORAL | Status: DC
  Administered 2016-02-17 – 2016-02-18 (×2): 20 mg via ORAL
  Filled 2016-02-17 (×2): qty 1

## 2016-02-17 MED ORDER — POTASSIUM CHLORIDE 20 MEQ/15ML (10%) PO SOLN
40.0000 meq | Freq: Every day | ORAL | Status: DC
  Administered 2016-02-18: 40 meq via ORAL
  Filled 2016-02-17: qty 30

## 2016-02-17 MED ORDER — LOPERAMIDE HCL 1 MG/5ML PO LIQD
2.0000 mg | ORAL | Status: DC | PRN
  Filled 2016-02-17: qty 10

## 2016-02-17 MED ORDER — FUROSEMIDE 20 MG PO TABS
20.0000 mg | ORAL_TABLET | Freq: Every day | ORAL | Status: DC
  Administered 2016-02-18: 20 mg via ORAL
  Filled 2016-02-17: qty 1

## 2016-02-17 MED ORDER — LEVOTHYROXINE SODIUM 25 MCG PO TABS
125.0000 ug | ORAL_TABLET | Freq: Every day | ORAL | Status: DC
  Administered 2016-02-18: 125 ug
  Filled 2016-02-17: qty 1

## 2016-02-17 MED ORDER — JEVITY 1.2 CAL PO LIQD: 1000.0000 mL | ORAL | Status: DC

## 2016-02-17 MED ORDER — VALPROIC ACID 250 MG PO CAPS
500.0000 mg | ORAL_CAPSULE | Freq: Two times a day (BID) | ORAL | Status: DC
  Administered 2016-02-17 – 2016-02-18 (×2): 500 mg via ORAL
  Filled 2016-02-17 (×3): qty 2

## 2016-02-17 NOTE — Progress Notes (Signed)
PATIENT DETAILS Name: [name] Age: 82 y.o. Sex: female Date of Birth: August 03, 1928 Admit Date: 02/12/2016 Admitting Physician Chilton Greathouse, MD EAV:[id number], Randon Goldsmith, MD  Subjective: Pleasantly confused.  Assessment/Plan: Principal Problem: Sepsis secondary to presumed HCAP with aspiration and complicated UTI: Sepsis pathophysiology has resolved, all cultures are negative. Remains afebrile, no leukocytosis. Continue Unasyn, will transition to Augmentin on discharge.  Active Problems: AKI: Likely prerenal azotemia. Resolved.  Acute metabolic encephalopathy: Secondary to sepsis/AKI. Has significant dementia at baseline, suspect current mental status is close to usual baseline.  Elevated troponin levels: Likely secondary to demand ischemia from acute illness. Given bedbound status, dementia and overall frailty and poor prognoses-not a candidate for any further evaluation.  Dysphagia: Chronic issue, speech therapy following, recommendations are for dysphagia 1 diet. Still has tube feeds in place (chronic PEG in place). Spoke with patient's daughter hope over the phone-4/5-okay to continue with pure feeds for comfort.  Anemia: Likely secondary to acute illness. No overt evidence of blood loss. Patient is demented, bedbound-very poor candidate for further aggressive investigations including endoscopic procedures.  Chronic diastolic heart failure: We'll resume Lasix.  COPD: Stable, lungs are clear anteriorly-continue bronchodilators.  Dementia with intermittent delirium: Continue Seroquel-resume Namenda and Aricept.  Bipolar disorder: Continue Seroquel, resume Depakote and Cymbalta.  Deconditioning: Secondary to acute illness-but has dementia at baseline. Very poor overall prognoses, will require ongoing physical therapy evaluation at SNF.  Blindness  Palliative care: Frail elderly female with dementia, bipolar disorder-severe dysphagia on PEG feeds, admitted  with severe sepsis with hypotension from presumed aspiration pneumonia and UTI. Although hemodynamically improved, she carries a very poor overall prognoses. Palliative care following, unfortunately even after extensive discussion family wishes to continue with full aggressive care. Remains a full code.  Disposition: Remain inpatient-Back to SNF 4/6. Family okay to DC to SNF with Foley catheter in place for comfort.  Antimicrobial agents  See below  Anti-infectives    Start     Dose/Rate Route Frequency Ordered Stop   02/15/16 1900  ampicillin-sulbactam (UNASYN) 1.5 g in sodium chloride 0.9 % 50 mL IVPB     1.5 g 100 mL/hr over 30 Minutes Intravenous Every 6 hours 02/15/16 1811     02/14/16 1900  ceFEPIme (MAXIPIME) 1 g in dextrose 5 % 50 mL IVPB  Status:  Discontinued     1 g 100 mL/hr over 30 Minutes Intravenous Every 8 hours 02/14/16 1159 02/15/16 1811   02/13/16 1200  ceFEPIme (MAXIPIME) 1 g in dextrose 5 % 50 mL IVPB  Status:  Discontinued     1 g 100 mL/hr over 30 Minutes Intravenous Every 24 hours 02/12/16 1632 02/14/16 1159   02/12/16 1645  vancomycin (VANCOCIN) IVPB 750 mg/150 ml premix  Status:  Discontinued     750 mg 150 mL/hr over 60 Minutes Intravenous Every 24 hours 02/12/16 1632 02/14/16 1158   02/12/16 1615  cefTRIAXone (ROCEPHIN) 1 g in dextrose 5 % 50 mL IVPB     1 g 100 mL/hr over 30 Minutes Intravenous  Once 02/12/16 1606 02/12/16 1918   02/12/16 1615  ceFEPIme (MAXIPIME) 2 g in dextrose 5 % 50 mL IVPB  Status:  Discontinued     2 g 100 mL/hr over 30 Minutes Intravenous  Once 02/12/16 1612 02/12/16 1622      DVT Prophylaxis: SCD's  Code Status: Full code   Family Communication Daughter-Hope-[phone]  Procedures: None  CONSULTS:  pulmonary/intensive care and Palliative care  Time spent 30 minutes-Greater than 50% of this time was spent in counseling, explanation of diagnosis, planning of further management, and coordination of  care.  MEDICATIONS: Scheduled Meds: . ampicillin-sulbactam (UNASYN) IV  1.5 g Intravenous Q6H  . feeding supplement (VITAL 1.5 CAL)  1,000 mL Per Tube Q24H  . free water  100 mL Per Tube 3 times per day  . insulin aspart  0-15 Units Subcutaneous 6 times per day  . ipratropium-albuterol  3 mL Nebulization TID  . [START ON 02/18/2016] levothyroxine  125 mcg Per Tube QAC breakfast  . polyvinyl alcohol  1 drop Both Eyes BID  . potassium chloride  40 mEq Oral TID  . QUEtiapine  50 mg Oral QPM  . sodium chloride flush  3 mL Intravenous Q12H   Continuous Infusions:  PRN Meds:.acetaminophen **OR** acetaminophen, ipratropium-albuterol, loperamide, sodium chloride flush  PHYSICAL EXAM: Vital signs in last 24 hours: Filed Vitals:   02/16/16 1606 02/16/16 2047 02/17/16 0637 02/17/16 1230  BP: 123/68 92/78 100/70 100/84  Pulse: 74 88 77 77  Temp:  99 F (37.2 C) 99 F (37.2 C) 98.7 F (37.1 C)  TempSrc:  Axillary Oral Oral  Resp: Weight:   71.079 kg (156 lb 11.2 oz)   SpO2: 91% 95% 92% 99%    Weight change: -1.021 kg (-2 lb 4 oz) Filed Weights   02/15/16 0353 02/16/16 0416 02/17/16 0637  Weight: 73.6 kg (162 lb 4.1 oz) 72.1 kg (158 lb 15.2 oz) 71.079 kg (156 lb 11.2 oz)   Body mass index is 26.08 kg/(m^2).   Gen Exam: Awake But pleasantly confused. Mumbles incoherently. Not in any distress.  Neck: Supple, No JVD.   Chest: B/L Clear anteriorly without any rhonchi. CVS: S1 S2 Regular Abdomen: soft, BS +, non tender, non distended. PEG tube Extremities: no edema, lower extremities warm to touch Neurologic: Non Focal-moves all 4 extremities-difficult exam.  Intake/Output from previous day:  Intake/Output Summary (Last 24 hours) at 02/17/16 1440 Last data filed at 02/17/16 1000  Gross per 24 hour  Intake    980 ml  Output    851 ml  Net    129 ml     LAB RESULTS: CBC  Recent Labs Lab 02/12/16 1440 02/13/16 0445 02/14/16 0515 02/16/16 0348 02/17/16 0500   WBC 10.0 6.8 6.6 10.9* 10.1  HGB 13.6 9.4* 9.3* 9.3* 9.0*  HCT 44.2 29.6* 29.4* 28.3* 26.6*  PLT 205 131* 124* 170 174  MCV 107.0* 106.5* 103.2* 98.3 100.8*  MCH 32.9 33.8 32.6 32.3 34.1*  MCHC 30.8 31.8 31.6 32.9 33.8  RDW 14.7 14.4 13.5 13.3 14.2  LYMPHSABS 1.3  --  0.5* 0.7  --   MONOABS 1.0  --  0.3 0.7  --   EOSABS 0.0  --  0.2 0.0  --   BASOSABS 0.0  --  0.0 0.0  --     Chemistries   Recent Labs Lab 02/12/16 1440 02/13/16 0445 02/14/16 0515 02/16/16 0348 02/17/16 0500  NA 148* 145 135 133* 141  K 4.6 3.8 3.4* 2.6* 4.4  CL 106 114* 103 98* 108  CO2 GLUCOSE 163* 93 175* 107* 111*  BUN 50* 32* CREATININE 1.40* 0.73 0.58 0.62 0.69  CALCIUM 8.9 7.1* 7.9* 8.0* 8.6*  MG  --  2.0  --   --   --     CBG:  Recent Labs Lab 02/16/16 2045 02/16/16 2347 02/17/16 0416 02/17/16 0823 02/17/16 1235  GLUCAP 117* 96 116* 103* 108*    GFR Estimated Creatinine Clearance: 50.8 mL/min (by C-G formula based on Cr of 0.69).  Coagulation profile  Recent Labs Lab 02/12/16 1641 02/13/16 0445 02/14/16 0515 02/17/16 0500  INR 1.12 1.13 1.02 1.08    Cardiac Enzymes  Recent Labs Lab 02/12/16 2109 02/13/16 0040 02/13/16 0930  TROPONINI 1.85* 0.30* 0.14*    Invalid input(s): POCBNP No results for input(s): DDIMER in the last 72 hours. No results for input(s): HGBA1C in the last 72 hours. No results for input(s): CHOL, HDL, LDLCALC, TRIG, CHOLHDL, LDLDIRECT in the last 72 hours. No results for input(s): TSH, T4TOTAL, T3FREE, THYROIDAB in the last 72 hours.  Invalid input(s): FREET3 No results for input(s): VITAMINB12, FOLATE, FERRITIN, TIBC, IRON, RETICCTPCT in the last 72 hours. No results for input(s): LIPASE, AMYLASE in the last 72 hours.  Urine Studies No results for input(s): UHGB, CRYS in the last 72 hours.  Invalid input(s): UACOL, UAPR, USPG, UPH, UTP, UGL, UKET, UBIL, UNIT, UROB, ULEU, UEPI, UWBC, URBC, UBAC, CAST, UCOM,  BILUA  MICROBIOLOGY: Recent Results (from the past 240 hour(s))  Culture, blood (Routine X 2) w Reflex to ID Panel     Status: None   Collection Time: 02/12/16  2:40 PM  Result Value Ref Range Status   Specimen Description BLOOD LEFT ANTECUBITAL  Final   Special Requests BOTTLES DRAWN AEROBIC ONLY 7CC  Final   Culture  Setup Time   Final    GRAM POSITIVE COCCI IN CLUSTERS AEROBIC BOTTLE ONLY CRITICAL RESULT CALLED TO, READ BACK BY AND VERIFIED WITH: L PURCELL,RN AT 1135 02/13/16 BY B MARTIN    Culture   Final    STAPHYLOCOCCUS SPECIES (COAGULASE NEGATIVE) THE SIGNIFICANCE OF ISOLATING THIS ORGANISM FROM A SINGLE SET OF BLOOD CULTURES WHEN MULTIPLE SETS ARE DRAWN IS UNCERTAIN. PLEASE NOTIFY THE MICROBIOLOGY DEPARTMENT WITHIN ONE WEEK IF SPECIATION AND SENSITIVITIES ARE REQUIRED.    Report Status 02/15/2016 FINAL  Final  Culture, blood (Routine X 2) w Reflex to ID Panel     Status: None   Collection Time: 02/12/16  3:33 PM  Result Value Ref Range Status   Specimen Description BLOOD RIGHT ANTECUBITAL  Final   Special Requests BOTTLES DRAWN AEROBIC AND ANAEROBIC 5CC  Final   Culture NO GROWTH 5 DAYS  Final   Report Status 02/17/2016 FINAL  Final  Urine culture     Status: None   Collection Time: 02/12/16  3:34 PM  Result Value Ref Range Status   Specimen Description URINE, CATHETERIZED  Final   Special Requests NONE  Final   Culture MULTIPLE SPECIES PRESENT, SUGGEST RECOLLECTION  Final   Report Status 02/14/2016 FINAL  Final  MRSA PCR Screening     Status: None   Collection Time: 02/13/16  4:33 AM  Result Value Ref Range Status   MRSA by PCR NEGATIVE NEGATIVE Final    Comment:        The GeneXpert MRSA Assay (FDA approved for NASAL specimens only), is one component of a comprehensive MRSA colonization surveillance program. It is not intended to diagnose MRSA infection nor to guide or monitor treatment for MRSA infections.   Culture, blood (Routine X 2) w Reflex to ID Panel      Status: None (Preliminary result)   Collection Time: 02/14/16  5:12 PM  Result Value Ref Range Status   Specimen Description BLOOD LEFT HAND  Final   Special Requests BOTTLES DRAWN AEROBIC ONLY 5CC  Final   Culture NO GROWTH 3 DAYS  Final   Report Status PENDING  Incomplete  Culture, blood (Routine X 2) w Reflex to ID Panel     Status: None (Preliminary result)   Collection Time: 02/14/16  5:13 PM  Result Value Ref Range Status   Specimen Description BLOOD LEFT ANTECUBITAL  Final   Special Requests BOTTLES DRAWN AEROBIC ONLY 5CC  Final   Culture NO GROWTH 3 DAYS  Final   Report Status PENDING  Incomplete    RADIOLOGY STUDIES/RESULTS: Dg Chest Port 1 View  02/16/2016  CLINICAL DATA:  Shortness of breath.  Central line placement. EXAM: PORTABLE CHEST - 1 VIEW COMPARISON:  One-view chest x-ray 02/15/2016 FINDINGS: The patient is rotated right. The heart size is magnified by low lung volumes. A right IJ line terminates in the mid SVC. A right pleural effusion is present. Bibasilar airspace disease is noted. Remote right-sided rib fractures are present. Left subclavian pacemaker lines are stable. IMPRESSION: 1. A right IJ line terminates in the mid SVC. 2. Bilateral pleural effusions and associated airspace disease likely reflect atelectasis. These are unchanged. Electronically Signed   By: Marin Roberts M.D.   On: 02/16/2016 11:22   Dg Chest Port 1 View  02/15/2016  CLINICAL DATA:  Pneumonia. EXAM: PORTABLE CHEST 1 VIEW COMPARISON:  02/13/2016. FINDINGS: Patient is rotated to the right. Cardiac pacer noted with lead tips in stable position. Heart size normal. Left lower lobe subsegmental atelectasis and or infiltrate. Tiny left pleural effusion cannot be excluded. No pneumothorax . IMPRESSION: 1. Mild left lower lobe subsegmental atelectasis and or infiltrate. Small left pleural effusion. No interim change. 2.  Cardiac pacer in stable position.  Heart size normal. Electronically Signed   By:  Maisie Fus  Register   On: 02/15/2016 07:30   Dg Chest Port 1 View  02/13/2016  CLINICAL DATA:  Sepsis. EXAM: PORTABLE CHEST 1 VIEW COMPARISON:  February 12, 2016. FINDINGS: Stable cardiomegaly. Left-sided pacemaker is unchanged in position. Right internal jugular catheter is unchanged with distal tip overlying expected position of the SVC. No pneumothorax is noted. Stable mild left basilar opacity is noted consistent with subsegmental atelectasis. Right lung is clear. Probable old distal right clavicular fracture is noted. IMPRESSION: Stable left basilar opacity is noted most consistent with subsegmental atelectasis. Electronically Signed   By: Lupita Raider, M.D.   On: 02/13/2016 07:36   Dg Chest Portable 1 View  02/12/2016  CLINICAL DATA:  82 year old female with central line placement. EXAM: PORTABLE CHEST 1 VIEW COMPARISON:  02/12/2016 and prior radiographs FINDINGS: The patient is rotated. Right IJ central venous catheter is noted with tip overlying the upper-mid SVC. There is no evidence of pneumothorax. Mild bibasilar atelectasis is present. Cardiomegaly left-sided pacemaker again identified. IMPRESSION: Right IJ central venous catheter with tip overlying the upper-mid SVC. No evidence of pneumothorax. Electronically Signed   By: Harmon Pier M.D.   On: 02/12/2016 20:21   Dg Chest Portable 1 View  02/12/2016  CLINICAL DATA:  Sepsis.  Shortness of breath. EXAM: PORTABLE CHEST 1 VIEW COMPARISON:  06/29/2014. FINDINGS: Patient rotated. This makes evaluation difficult. Mild right base atelectasis and/or infiltrate cannot be excluded. No pleural effusion or pneumothorax. Heart size normal. Cardiac pacer in stable position. Old healed right rib fractures. IMPRESSION: Patient is rotated making evaluation difficult. Mild right base atelectasis and or infiltrate cannot be excluded. Follow-up chest x-ray with better alignment should  be considered. Electronically Signed   By: Maisie Fushomas  Register   On: 02/12/2016 15:54      Jeoffrey MassedGHIMIRE,SHANKER, MD  [facility] Pager:[phone]  If 7PM-7AM, please contact night-coverage [email or web address] Password [email or web address] 02/17/2016, 2:40 PM   LOS: 5 days

## 2016-02-17 NOTE — Progress Notes (Signed)
Speech Language Pathology Treatment: Dysphagia  Patient Details Name: [name] MRN: [id number] DOB: 11/21/1927 Today's Date: 02/17/2016 Time: 1001-1009 SLP Time Calculation (min) (ACUTE ONLY): 8 min  Assessment / Plan / Recommendation Clinical Impression  Pt was more alert this morning and more communicative. She had better initiation and acceptance with PO trials with no overt s/s of aspiration observed across challenging. Mild audible congestion noted at baseline that did not appear to change with intake. Recommend to continue with current diet with full supervision, holding if overt difficulty is noted.   HPI HPI: 82 year old resident of SNF with history of COPD, bipolar disorder, dementia, hypothyroidism, diastolic heart failure, dysphagia and PEG, hypertension, dementia, admitted with severe sepsis. Likely source pneumonia, urine. Initial CXR with possible RLL infiltrate, but most recent CXR 4/1 shows stable left basilar opacity most consistent with subsegmental atelectasis. Pt had most recent MBS in 07/2013 recommending Dys 1 diet and thin liquids, an advancement from nectar thick liquids. At that time, pt had been decannulated from prior trach but had PEG due to poor oral intake. Most recent swallow evaluation at bedside in 2015 recommending Dys 1 diet and nectar thick liquids due to concern for silent aspiration post-extubation.      SLP Plan  Continue with current plan of care     Recommendations  Diet recommendations: Dysphagia 1 (puree);Thin liquid Liquids provided via: Cup;Straw Medication Administration: Crushed with puree Supervision: Staff to assist with self feeding;Full supervision/cueing for compensatory strategies Compensations: Slow rate;Small sips/bites;Other (Comment) (hold POs if coughing) Postural Changes and/or Swallow Maneuvers: Seated upright 90 degrees             Oral Care Recommendations: Oral care BID Follow up Recommendations: Skilled Nursing  facility Plan: Continue with current plan of care     GO               [name], [name] [phone]  [name], [name] 02/17/2016, 1:50 PM

## 2016-02-17 NOTE — Progress Notes (Signed)
Initial Nutrition Assessment  INTERVENTION:   Recommend resuming pt's TF regimen from PTA. Provide Osmolite 1.5 @ 80 ml/hr via PEG from 1800 hr to 0600 hr.   Tube feeding regimen provides 1440 kcal (100% of needs), 60 grams of protein, and 730 ml of H2O.    NUTRITION DIAGNOSIS:   Inadequate oral intake related to chronic illness, poor appetite, dysphagia as evidenced by meal completion < 25%.   GOAL:   Patient will meet greater than or equal to 90% of their needs   MONITOR:   PO intake, Weight trends, TF tolerance, Labs, I & O's  REASON FOR ASSESSMENT:   Other (Comment) (Tube Feeding)    ASSESSMENT:   82 year old female patient, resident of skilled nursing facility ,with past medical history of COPD, bipolar disorder and dementia with behavioral disturbance, known tardive dyskinesia, hypothyroidism, chronic diastolic heart failure, chronic dysphagia requiring PEG tube for feeds and meds, permanent pacemaker, hypertension and adult failure to thrive. Patient presented to the ER with reports of altered mentation from baseline. Nursing home reported to EMS patient had been running fever.   Pt awake and alert at time of visit, but speech very difficult to understand. Vital 1.5 infusing via PEG @ 30 ml/hr which provides 1080 kcal and 45 grams of protein. Per nursing notes, pt ate 10% of breakfast this morning. Per weight history, pt's weight has been stable for the past year.  [facility] that patient receives Osmolite 1.5 formula at 80 ml/hr from 1800 hr to 0600 hr daily; she also reports that patient eats very little during the day.   Labs: low hemoglobin, low albumin, low calcium  Diet Order:  DIET - DYS 1 Room service appropriate?: Yes; Fluid consistency:: Thin  Skin:  Reviewed, no issues  Last BM:  4/4 rectal tube  Height:   Ht Readings from Last 1 Encounters:  02/03/16 5\' 5"  (1.651 m)    Weight:   Wt Readings from Last  1 Encounters:  02/17/16 156 lb 11.2 oz (71.079 kg)    Ideal Body Weight:  56.8 kg  BMI:  Body mass index is 26.08 kg/(m^2).  Estimated Nutritional Needs:   Kcal:  1420-1700  Protein:  85-100 grams  Fluid:  1.4-1.6 L/day  EDUCATION NEEDS:   No education needs identified at this time  Dorothea Ogleeanne Jermey Closs RD, LDN Inpatient Clinical Dietitian Pager: [phone] After Hours Pager: [phone]

## 2016-02-18 DIAGNOSIS — R6520 Severe sepsis without septic shock: Secondary | ICD-10-CM

## 2016-02-18 DIAGNOSIS — E038 Other specified hypothyroidism: Secondary | ICD-10-CM

## 2016-02-18 DIAGNOSIS — E034 Atrophy of thyroid (acquired): Secondary | ICD-10-CM

## 2016-02-18 DIAGNOSIS — I10 Essential (primary) hypertension: Secondary | ICD-10-CM

## 2016-02-18 LAB — GLUCOSE, CAPILLARY
GLUCOSE-CAPILLARY: 119 mg/dL — AB (ref 65–99)
Glucose-Capillary: 123 mg/dL — ABNORMAL HIGH (ref 65–99)
Glucose-Capillary: 98 mg/dL (ref 65–99)

## 2016-02-18 MED ORDER — LEVOTHYROXINE SODIUM 125 MCG PO TABS: 125.0000 ug | ORAL_TABLET | Freq: Every day | ORAL | Status: AC

## 2016-02-18 MED ORDER — MEMANTINE HCL-DONEPEZIL HCL ER 28-10 MG PO CP24: 1.0000 | ORAL_CAPSULE | Freq: Every day | ORAL | Status: AC

## 2016-02-18 MED ORDER — OXYCODONE HCL 5 MG PO TABS: 5.0000 mg | ORAL_TABLET | ORAL | Status: DC

## 2016-02-18 MED ORDER — IPRATROPIUM-ALBUTEROL 0.5-2.5 (3) MG/3ML IN SOLN: 3.0000 mL | RESPIRATORY_TRACT | Status: AC | PRN

## 2016-02-18 MED ORDER — FREE WATER: 100.0000 mL | Freq: Three times a day (TID) | Status: AC

## 2016-02-18 MED ORDER — OCUVITE PO TABS: 1.0000 | ORAL_TABLET | Freq: Every day | ORAL | Status: AC

## 2016-02-18 MED ORDER — FUROSEMIDE 20 MG PO TABS: 20.0000 mg | ORAL_TABLET | Freq: Every day | ORAL | Status: AC

## 2016-02-18 MED ORDER — LOPERAMIDE HCL 1 MG/5ML PO LIQD: 2.0000 mg | ORAL | Status: AC | PRN

## 2016-02-18 MED ORDER — QUETIAPINE FUMARATE 50 MG PO TABS: 50.0000 mg | ORAL_TABLET | Freq: Every evening | ORAL | Status: AC

## 2016-02-18 MED ORDER — AMOXICILLIN-POT CLAVULANATE 500-125 MG PO TABS: 1.0000 | ORAL_TABLET | Freq: Two times a day (BID) | ORAL | Status: DC

## 2016-02-18 MED ORDER — ADULT MULTIVITAMIN W/MINERALS CH: 1.0000 | ORAL_TABLET | Freq: Every day | ORAL | Status: AC

## 2016-02-18 MED ORDER — POLYETHYLENE GLYCOL 3350 17 G PO PACK: 17.0000 g | PACK | Freq: Every day | ORAL | Status: AC

## 2016-02-18 MED ORDER — IPRATROPIUM-ALBUTEROL 0.5-2.5 (3) MG/3ML IN SOLN: 3.0000 mL | Freq: Four times a day (QID) | RESPIRATORY_TRACT | Status: AC

## 2016-02-18 MED ORDER — POTASSIUM CHLORIDE 20 MEQ/15ML (10%) PO SOLN: 20.0000 meq | Freq: Every day | ORAL | Status: AC

## 2016-02-18 MED ORDER — FAMOTIDINE 20 MG PO TABS
20.0000 mg | ORAL_TABLET | Freq: Two times a day (BID) | ORAL | Status: AC
Start: 2016-02-18 — End: ?

## 2016-02-18 MED ORDER — OSMOLITE 1.5 CAL PO LIQD: 1000.0000 mL | ORAL | Status: AC

## 2016-02-18 NOTE — Clinical Social Work Note (Cosign Needed)
Clinical Social Work Assessment  Patient Details  Name: [name] MRN: [id number] Date of Birth: 05/19/1928  Date of referral:  02/18/16               Reason for consult:  Facility Placement                Permission sought to share information with:  Case Manager, Magazine features editoracility Contact Representative, Family Supports Permission granted to share information::  Yes, Release of Information Signed  Name::      Aeronautical engineer([name] )  Agency::     Relationship::   (son)  Contact Information:   [phone])  Housing/Transportation Living arrangements for the past 2 months:  Skilled Nursing Facility Source of Information:  Patient Patient Interpreter Needed:  None Criminal Activity/Legal Involvement Pertinent to Current Situation/Hospitalization:  No - Comment as needed Significant Relationships:  Adult Children Lives with:  Facility Resident Do you feel safe going back to the place where you live?  Yes Need for family participation in patient care:  Yes (Comment)  Care giving concerns:  Returning to SNF   Social Worker assessment / plan:  BSW intern spoke with son about mom returning to SNF. Son is agreeable that mom can go back to SNF. Patients son had questions for the doctor about  mom discharging so soon. BSW intern notified facility on patients return and confirmed patient is able to return.   Employment status:  Disabled (Comment on whether or not currently receiving Disability) Insurance information:  Medicare PT Recommendations:  Skilled Nursing Facility Information / Referral to community resources:  Skilled Nursing Facility  Patient/Family's Response to care:  Patient son has good response to care.   Patient/Family's Understanding of and Emotional Response to Diagnosis, Current Treatment, and Prognosis:  Patient son has a good insight on moms condition and current treatment plans.  Emotional Assessment Appearance:  Appears stated age Attitude/Demeanor/Rapport:  Unable to  Assess Affect (typically observed):  Unable to Assess Orientation:    Alcohol / Substance use:  Tobacco Use (former smoker) Psych involvement (Current and /or in the community):  No (Comment)  Discharge Needs  Concerns to be addressed:  No discharge needs identified Readmission within the last 30 days:    Current discharge risk:  None Barriers to Discharge:  No Barriers Identified   [name]  BSW intern  [phone]

## 2016-02-18 NOTE — Discharge Summary (Addendum)
PATIENT DETAILS Name: [name] Age: 82 y.o. Sex: female Date of Birth: [birth year] MRN: [id number]. Admitting Physician: Chilton Greathouse, MD WUJ:[id number], Randon Goldsmith, MD  Admit Date: 02/12/2016 Discharge date: 02/18/2016  Recommendations for Outpatient Follow-up:  1. Ensure Palliative care follow up at SNF-remains a full code. But with very poor overall prognoses 2. Strict Aspiration precautions-family ok with Dys 1 diet. 3. May need Speech therapy follow up at SNF.  4. Please repeat CBC/BMET 1 week.  PRIMARY DISCHARGE DIAGNOSIS:  Principal Problem:   Septic shock (HCC) Active Problems:   Dementia with behavioral disturbance   Bipolar affective disorder (HCC)   Tardive dyskinesia   Essential hypertension, benign   COPD (chronic obstructive pulmonary disease) (HCC)   UTI (urinary tract infection)   PEG (percutaneous endoscopic gastrostomy) status (HCC)   Hypothyroidism due to acquired atrophy of thyroid   Chronic diastolic heart failure (HCC)   Dysphagia, pharyngoesophageal phase   HCAP (healthcare-associated pneumonia)   AKI (acute kidney injury) (HCC)   Elevated troponin   Encounter for palliative care   Goals of care, counseling/discussion      PAST MEDICAL HISTORY: Past Medical History  Diagnosis Date  . COPD (chronic obstructive pulmonary disease) (HCC)   . Hypertension   . Blind   . Hypothyroid   . Pacemaker   . Renal insufficiency   . Finger fracture   . Dementia   . Bipolar disorder (HCC)   . Dysphagia     needs thick nectar liquids  . Atrial fibrillation (HCC)   . Pneumonia   . Anxiety   . Diastolic heart failure (HCC)     DISCHARGE MEDICATIONS: Current Discharge Medication List    START taking these medications   Details  amoxicillin-clavulanate (AUGMENTIN) 500-125 MG tablet Place 1 tablet (500 mg total) into feeding tube 2 (two) times daily. For 2 more days from 4/6    loperamide (IMODIUM) 1 MG/5ML solution Place 10 mLs (2 mg total) into  feeding tube as needed for diarrhea or loose stools. Refills: 0    potassium chloride 20 MEQ/15ML (10%) SOLN Place 15 mLs (20 mEq total) into feeding tube daily. Qty: 450 mL, Refills: 0    Water For Irrigation, Sterile (FREE WATER) SOLN Place 100 mLs into feeding tube every 8 (eight) hours.      CONTINUE these medications which have CHANGED   Details  !! beta carotene w/minerals (OCUVITE) tablet Place 1 tablet into feeding tube daily.    famotidine (PEPCID) 20 MG tablet Place 1 tablet (20 mg total) into feeding tube 2 (two) times daily.    furosemide (LASIX) 20 MG tablet Place 1 tablet (20 mg total) into feeding tube daily.    !! ipratropium-albuterol (DUONEB) 0.5-2.5 (3) MG/3ML SOLN Take 3 mLs by nebulization 4 (four) times daily.    !! ipratropium-albuterol (DUONEB) 0.5-2.5 (3) MG/3ML SOLN Take 3 mLs by nebulization every 3 (three) hours as needed.    levothyroxine (SYNTHROID, LEVOTHROID) 125 MCG tablet Place 1 tablet (125 mcg total) into feeding tube daily before breakfast. Reported on 02/12/2016    Memantine HCl-Donepezil HCl (NAMZARIC) 28-10 MG CP24 1 capsule by PEG Tube route daily. Qty: 30 capsule    !! Multiple Vitamin (MULTIVITAMIN WITH MINERALS) TABS tablet Place 1 tablet into feeding tube daily.    Nutritional Supplements (FEEDING SUPPLEMENT, OSMOLITE 1.5 CAL,) LIQD Place 1,000 mLs into feeding tube daily. Per Tube, at 80 mL/hr, Every 24 hours Refills: 0    oxyCODONE (OXY IR/ROXICODONE) 5 MG immediate release  tablet Place 1 tablet (5 mg total) into feeding tube See admin instructions. Take one tablet by mouth every 4 hours for pain Qty: 20 tablet, Refills: 0   Associated Diagnoses: Chronic pain    polyethylene glycol (MIRALAX / GLYCOLAX) packet Place 17 g into feeding tube daily. Refills: 0    QUEtiapine (SEROQUEL) 50 MG tablet Place 1 tablet (50 mg total) into feeding tube every evening.     !! - Potential duplicate medications found. Please discuss with provider.      CONTINUE these medications which have NOT CHANGED   Details  docusate sodium (COLACE) 100 MG capsule Take 100 mg by mouth daily.    lidocaine (LIDODERM) 5 % Place 1 patch onto the skin 2 (two) times daily. Apply 1/2 patch to each shoulder and to each knee for 12 hours, then remove    Propylene Glycol (SYSTANE BALANCE) 0.6 % SOLN Place 1 drop into both eyes 2 (two) times daily.     Valproic Acid 500 MG CPDR Take 500 mg by mouth 2 (two) times daily.    DULoxetine (CYMBALTA) 60 MG capsule Take 60 mg by mouth daily.    OXYGEN Inhale 2 Imperial Gallon into the lungs continuous. Reported on 02/12/2016    UNABLE TO FIND Reported on 02/12/2016      STOP taking these medications     meloxicam (MOBIC) 15 MG tablet         ALLERGIES:  No Known Allergies  BRIEF HPI:  See H&P, Labs, Consult and Test reports for all details in brief, 82 year old resident of SNF with history of COPD, bipolar disorder, dementia, hypothyroidism, diastolic heart failure, dysphagia and PEG, hypertension. She presented to the ED with altered mental status, fevers, sweats and a temperature of 101, hypotensive with elevated lactic acid of 5, Cr of 1.4.   CONSULTATIONS:   pulmonary/intensive care and Palliative care  PERTINENT RADIOLOGIC STUDIES: Dg Chest Port 1 View  02/16/2016  CLINICAL DATA:  Shortness of breath.  Central line placement. EXAM: PORTABLE CHEST - 1 VIEW COMPARISON:  One-view chest x-ray 02/15/2016 FINDINGS: The patient is rotated right. The heart size is magnified by low lung volumes. A right IJ line terminates in the mid SVC. A right pleural effusion is present. Bibasilar airspace disease is noted. Remote right-sided rib fractures are present. Left subclavian pacemaker lines are stable. IMPRESSION: 1. A right IJ line terminates in the mid SVC. 2. Bilateral pleural effusions and associated airspace disease likely reflect atelectasis. These are unchanged. Electronically Signed   By: Marin Roberts  M.D.   On: 02/16/2016 11:22   Dg Chest Port 1 View  02/15/2016  CLINICAL DATA:  Pneumonia. EXAM: PORTABLE CHEST 1 VIEW COMPARISON:  02/13/2016. FINDINGS: Patient is rotated to the right. Cardiac pacer noted with lead tips in stable position. Heart size normal. Left lower lobe subsegmental atelectasis and or infiltrate. Tiny left pleural effusion cannot be excluded. No pneumothorax . IMPRESSION: 1. Mild left lower lobe subsegmental atelectasis and or infiltrate. Small left pleural effusion. No interim change. 2.  Cardiac pacer in stable position.  Heart size normal. Electronically Signed   By: Maisie Fus  Register   On: 02/15/2016 07:30   Dg Chest Port 1 View  02/13/2016  CLINICAL DATA:  Sepsis. EXAM: PORTABLE CHEST 1 VIEW COMPARISON:  February 12, 2016. FINDINGS: Stable cardiomegaly. Left-sided pacemaker is unchanged in position. Right internal jugular catheter is unchanged with distal tip overlying expected position of the SVC. No pneumothorax is noted. Stable mild left  basilar opacity is noted consistent with subsegmental atelectasis. Right lung is clear. Probable old distal right clavicular fracture is noted. IMPRESSION: Stable left basilar opacity is noted most consistent with subsegmental atelectasis. Electronically Signed   By: Lupita RaiderJames  Green Jr, M.D.   On: 02/13/2016 07:36   Dg Chest Portable 1 View  02/12/2016  CLINICAL DATA:  82 year old female with central line placement. EXAM: PORTABLE CHEST 1 VIEW COMPARISON:  02/12/2016 and prior radiographs FINDINGS: The patient is rotated. Right IJ central venous catheter is noted with tip overlying the upper-mid SVC. There is no evidence of pneumothorax. Mild bibasilar atelectasis is present. Cardiomegaly left-sided pacemaker again identified. IMPRESSION: Right IJ central venous catheter with tip overlying the upper-mid SVC. No evidence of pneumothorax. Electronically Signed   By: Harmon PierJeffrey  Hu M.D.   On: 02/12/2016 20:21   Dg Chest Portable 1 View  02/12/2016   CLINICAL DATA:  Sepsis.  Shortness of breath. EXAM: PORTABLE CHEST 1 VIEW COMPARISON:  06/29/2014. FINDINGS: Patient rotated. This makes evaluation difficult. Mild right base atelectasis and/or infiltrate cannot be excluded. No pleural effusion or pneumothorax. Heart size normal. Cardiac pacer in stable position. Old healed right rib fractures. IMPRESSION: Patient is rotated making evaluation difficult. Mild right base atelectasis and or infiltrate cannot be excluded. Follow-up chest x-ray with better alignment should be considered. Electronically Signed   By: Maisie Fushomas  Register   On: 02/12/2016 15:54     PERTINENT LAB RESULTS: CBC:  Recent Labs  02/16/16 0348 02/17/16 0500  WBC 10.9* 10.1  HGB 9.3* 9.0*  HCT 28.3* 26.6*  PLT 170 174   CMET CMP     Component Value Date/Time   NA 141 02/17/2016 0500   NA 146 12/30/2015   K 4.4 02/17/2016 0500   CL 108 02/17/2016 0500   CO2 23 02/17/2016 0500   GLUCOSE 111* 02/17/2016 0500   BUN 10 02/17/2016 0500   BUN 44* 12/30/2015   CREATININE 0.69 02/17/2016 0500   CREATININE 0.9 12/30/2015   CALCIUM 8.6* 02/17/2016 0500   PROT 5.2* 02/17/2016 0500   ALBUMIN 2.1* 02/17/2016 0500   AST 25 02/17/2016 0500   ALT 17 02/17/2016 0500   ALKPHOS 62 02/17/2016 0500   BILITOT 0.5 02/17/2016 0500   GFRNONAA >60 02/17/2016 0500   GFRAA >60 02/17/2016 0500    GFR Estimated Creatinine Clearance: 51.1 mL/min (by C-G formula based on Cr of 0.69). No results for input(s): LIPASE, AMYLASE in the last 72 hours. No results for input(s): CKTOTAL, CKMB, CKMBINDEX, TROPONINI in the last 72 hours. Invalid input(s): POCBNP No results for input(s): DDIMER in the last 72 hours. No results for input(s): HGBA1C in the last 72 hours. No results for input(s): CHOL, HDL, LDLCALC, TRIG, CHOLHDL, LDLDIRECT in the last 72 hours. No results for input(s): TSH, T4TOTAL, T3FREE, THYROIDAB in the last 72 hours.  Invalid input(s): FREET3 No results for input(s):  VITAMINB12, FOLATE, FERRITIN, TIBC, IRON, RETICCTPCT in the last 72 hours. Coags:  Recent Labs  02/17/16 0500  INR 1.08   Microbiology: Recent Results (from the past 240 hour(s))  Culture, blood (Routine X 2) w Reflex to ID Panel     Status: None   Collection Time: 02/12/16  2:40 PM  Result Value Ref Range Status   Specimen Description BLOOD LEFT ANTECUBITAL  Final   Special Requests BOTTLES DRAWN AEROBIC ONLY 7CC  Final   Culture  Setup Time   Final    GRAM POSITIVE COCCI IN CLUSTERS AEROBIC BOTTLE ONLY CRITICAL RESULT  CALLED TO, READ BACK BY AND VERIFIED WITH: L PURCELL,RN AT 1135 02/13/16 BY B MARTIN    Culture   Final    STAPHYLOCOCCUS SPECIES (COAGULASE NEGATIVE) THE SIGNIFICANCE OF ISOLATING THIS ORGANISM FROM A SINGLE SET OF BLOOD CULTURES WHEN MULTIPLE SETS ARE DRAWN IS UNCERTAIN. PLEASE NOTIFY THE MICROBIOLOGY DEPARTMENT WITHIN ONE WEEK IF SPECIATION AND SENSITIVITIES ARE REQUIRED.    Report Status 02/15/2016 FINAL  Final  Culture, blood (Routine X 2) w Reflex to ID Panel     Status: None   Collection Time: 02/12/16  3:33 PM  Result Value Ref Range Status   Specimen Description BLOOD RIGHT ANTECUBITAL  Final   Special Requests BOTTLES DRAWN AEROBIC AND ANAEROBIC 5CC  Final   Culture NO GROWTH 5 DAYS  Final   Report Status 02/17/2016 FINAL  Final  Urine culture     Status: None   Collection Time: 02/12/16  3:34 PM  Result Value Ref Range Status   Specimen Description URINE, CATHETERIZED  Final   Special Requests NONE  Final   Culture MULTIPLE SPECIES PRESENT, SUGGEST RECOLLECTION  Final   Report Status 02/14/2016 FINAL  Final  MRSA PCR Screening     Status: None   Collection Time: 02/13/16  4:33 AM  Result Value Ref Range Status   MRSA by PCR NEGATIVE NEGATIVE Final    Comment:        The GeneXpert MRSA Assay (FDA approved for NASAL specimens only), is one component of a comprehensive MRSA colonization surveillance program. It is not intended to diagnose  MRSA infection nor to guide or monitor treatment for MRSA infections.   Culture, blood (Routine X 2) w Reflex to ID Panel     Status: None (Preliminary result)   Collection Time: 02/14/16  5:12 PM  Result Value Ref Range Status   Specimen Description BLOOD LEFT HAND  Final   Special Requests BOTTLES DRAWN AEROBIC ONLY 5CC  Final   Culture NO GROWTH 3 DAYS  Final   Report Status PENDING  Incomplete  Culture, blood (Routine X 2) w Reflex to ID Panel     Status: None (Preliminary result)   Collection Time: 02/14/16  5:13 PM  Result Value Ref Range Status   Specimen Description BLOOD LEFT ANTECUBITAL  Final   Special Requests BOTTLES DRAWN AEROBIC ONLY 5CC  Final   Culture NO GROWTH 3 DAYS  Final   Report Status PENDING  Incomplete     BRIEF HOSPITAL COURSE:  Sepsis secondary to presumed HCAP with aspiration and complicated UTI: Sepsis pathophysiology has resolved, all cultures are negative. Remains afebrile, no leukocytosis. Continue Unasyn, will transition to Augmentin on discharge.  Active Problems: AKI: Likely prerenal azotemia. Resolved.  Acute metabolic encephalopathy: Secondary to sepsis/AKI. Has significant dementia at baseline, suspect current mental status is close to usual baseline.She is currently able to follow some commands.  Elevated troponin levels: Likely secondary to demand ischemia from acute illness. Given bedbound status, dementia and overall frailty and poor prognoses-not a candidate for any further evaluation.  Dysphagia: Chronic issue, speech therapy following, recommendations are for dysphagia 1 diet. Still has tube feeds in place (chronic PEG in place). Spoke with patient's daughter hope over the phone-02/17/16 and son Gifford Shave 4/6-both are okay to continue with pure feeds for comfort-and are aware of aspiration risk.   Anemia: Likely secondary to acute illness. No overt evidence of blood loss. Patient is demented, bedbound-very poor candidate for further aggressive  investigations including endoscopic procedures.  Chronic diastolic  heart failure: compensated- resume Lasix.  COPD: Stablebut has some pooling of secretions in her throat-hence has some transmitted upper airway sounds-but is otherwise clear-continue bronchodilators.  Dementia with intermittent delirium: Continue Seroquel, Namenda and Aricept.  Bipolar disorder: Continue Seroquel,  Depakote and Cymbalta.  Deconditioning: Secondary to acute illness-but has dementia at baseline. Very poor overall prognoses, will require nursing care, slp eval and maybe if able to participate with PT-PT eval while at SNF.  Blindness  Palliative care: Frail elderly female with dementia, bipolar disorder-severe dysphagia on PEG feeds, admitted with severe sepsis with hypotension from presumed aspiration pneumonia and UTI. Although hemodynamically improved, she carries a very poor overall prognoses.Her improvement has plateaued over the past few days.Main issue at this time is deconditioning, continued intermittent aspiration and will require close monitoring at SNF.Patient remains at high risk for future decompensation due to aspiration and very poor overall health.Palliative care consulted, unfortunately even after extensive discussion family wishes to continue with full aggressive care. Remains a full code.This MD spoke with daughter-Hope yesterday (she is a Charity fundraiser) she is aware of poor overall prognoses-and is aware of potential for recurrent hospitalization etc. She is aware of our recommendation to transition to hospice care if she declines in the future. I also spoke with patient's son [name] (HPOA)-on 4/6-he at this time does not wish to consider palliative/hospice measures at this time.He however was agreeable to continue with Dys 1 diet and to continue Foley catheter on discharge. He is aware of aspiration risk with Dys 1 diet and risks of UTI with Foley catheter-he is accepting of these risks-but is agreeable to use  these measures for comfort. Please ensure follow up with palliative are at SNF for continued engagement with family  TODAY-DAY OF DISCHARGE:  Subjective:   [name] today has no headache,no chest abdominal pain,no new weakness tingling or numbness, feels much better wants to go home today.   Objective:   Blood pressure 131/98, pulse 81, temperature 99.1 F (37.3 C), temperature source Axillary, resp. rate 26, weight 71.759 kg (158 lb 3.2 oz), SpO2 88 %.  Intake/Output Summary (Last 24 hours) at 02/18/16 1101 Last data filed at 02/18/16 1000  Gross per 24 hour  Intake   1580 ml  Output   1070 ml  Net    510 ml   Filed Weights   02/16/16 0416 02/17/16 0637 02/18/16 0454  Weight: 72.1 kg (158 lb 15.2 oz) 71.079 kg (156 lb 11.2 oz) 71.759 kg (158 lb 3.2 oz)    Exam Awake Alert, Oriented *3, No new F.N deficits, Normal affect Garrett.AT,PERRAL Supple Neck,No JVD, No cervical lymphadenopathy appriciated.  Symmetrical Chest wall movement, Good air movement bilaterally, CTAB RRR,No Gallops,Rubs or new Murmurs, No Parasternal Heave +ve B.Sounds, Abd Soft, Non tender, No organomegaly appriciated, No rebound -guarding or rigidity. No Cyanosis, Clubbing or edema, No new Rash or bruise  DISCHARGE CONDITION: Stable  DISPOSITION: SNF  DISCHARGE INSTRUCTIONS:    Activity:  As tolerated with Full fall precautions use walker/cane & assistance as needed  Get Medicines reviewed and adjusted: Please take all your medications with you for your next visit with your Primary MD  Please request your Primary MD to go over all hospital tests and procedure/radiological results at the follow up, please ask your Primary MD to get all Hospital records sent to his/her office.  If you experience worsening of your admission symptoms, develop shortness of breath, life threatening emergency, suicidal or homicidal thoughts you must seek medical attention immediately  by calling 911 or calling your MD  immediately  if symptoms less severe.  You must read complete instructions/literature along with all the possible adverse reactions/side effects for all the Medicines you take and that have been prescribed to you. Take any new Medicines after you have completely understood and accpet all the possible adverse reactions/side effects.   Do not drive when taking Pain medications.   Do not take more than prescribed Pain, Sleep and Anxiety Medications  Special Instructions: If you have smoked or chewed Tobacco  in the last 2 yrs please stop smoking, stop any regular Alcohol  and or any Recreational drug use.  Wear Seat belts while driving.  Please note  You were cared for by a hospitalist during your hospital stay. Once you are discharged, your primary care physician will handle any further medical issues. Please note that NO REFILLS for any discharge medications will be authorized once you are discharged, as it is imperative that you return to your primary care physician (or establish a relationship with a primary care physician if you do not have one) for your aftercare needs so that they can reassess your need for medications and monitor your lab values.   Diet recommendation: See below  Discharge Instructions    Diet - low sodium heart healthy    Complete by:  As directed   Diet recommendations: Dysphagia 1 (puree);Thin liquid Liquids provided via: Cup;Straw Medication Administration: Crushed with puree Supervision: Staff to assist with self feeding;Full supervision/cueing for compensatory strategies Compensations: Slow rate;Small sips/bites;Other (Comment) (hold POs if coughing) Postural Changes and/or Swallow Maneuvers: Seated upright 90 degrees     Increase activity slowly    Complete by:  As directed            Follow-up Information    Follow up with Margit Hanks, MD.   Specialty:  Internal Medicine   Contact information:   [address] [place]  [address] [phone]      Total Time spent on discharge equals 45 minutes.  SignedJeoffrey Massed 02/18/2016 11:01 AM

## 2016-02-18 NOTE — Clinical Social Work Placement (Cosign Needed)
   CLINICAL SOCIAL WORK PLACEMENT  NOTE  Date:  02/18/2016  Patient Details  Name: [name] MRN: [id number] Date of Birth: 12/31/1927  Clinical Social Work is seeking post-discharge placement for this patient at the Skilled  Nursing Facility level of care (*CSW will initial, date and re-position this form in  chart as items are completed):  Yes   Patient/family provided with [facility] Clinical Social Work Department's list of facilities offering this level of care within the geographic area requested by the patient (or if unable, by the patient's family).  Yes   Patient/family informed of their freedom to choose among providers that offer the needed level of care, that participate in Medicare, Medicaid or managed care program needed by the patient, have an available bed and are willing to accept the patient.  Yes   Patient/family informed of [facility] ownership interest [facility] and [facility], as well as of the fact that they are under no obligation to receive care at these facilities.  PASRR submitted to EDS on       PASRR number received on       Existing PASRR number confirmed on 02/18/16     FL2 transmitted to all facilities in geographic area requested by pt/family on 02/18/16     FL2 transmitted to all facilities within larger geographic area on       Patient informed that his/her managed care company has contracts with or will negotiate with certain facilities, including the following:        Yes   Patient/family informed of bed offers received.  Patient [facility] bed at  [facility])     Physician recommends and patient chooses bed at      Patient to be transferred to  ([facility]) on 02/18/16.  Patient to be transferred to facility by  Sharin Mons([facility])     Patient family notified on 02/18/16 of transfer.  Name of family member notified:   (son [name] [phone])     PHYSICIAN Please sign FL2     Additional  Comment:    _______________________________________________ Catheryn Baconharlean Avis Mcmahill  BSW intern  [phone]

## 2016-02-18 NOTE — [place] FL2 (Signed)
[place] MEDICAID FL2 LEVEL OF CARE SCREENING TOOL     IDENTIFICATION  Patient Name: [name] Birthdate: 02/07/1928 Sex: female Admission Date (Current Location): 02/12/2016  [facility] [place]   Facility and Address:  The . [facility], [address], [place], [place] [address]      Provider Number: [id number]  Attending Physician Name and Address:  Maretta BeesShanker M Ghimire, MD  Relative Name and Phone Number:   Shona Simpson([name] Mcclurg [phone]))    Current Level of Care: Home Recommended Level of Care: Skilled Nursing Facility Prior Approval Number:    Date Approved/Denied:   PASRR Number:    Discharge Plan: SNF    Current Diagnoses: Patient Active Problem List   Diagnosis Date Noted  . Encounter for palliative care   . Goals of care, counseling/discussion   . Septic shock (HCC) 02/12/2016  . HCAP (healthcare-associated pneumonia) 02/12/2016  . AKI (acute kidney injury) (HCC) 02/12/2016  . Elevated troponin 02/12/2016  . Hypotension   . Dysphagia, pharyngoesophageal phase 12/29/2015  . Chronic diastolic heart failure (HCC) 10/29/2014  . Emphysema of lung (HCC) 09/17/2014  . Hypothyroidism due to acquired atrophy of thyroid 09/17/2014  . FTT (failure to thrive) in adult 07/13/2014  . UTI (urinary tract infection) 07/13/2014  . PEG (percutaneous endoscopic gastrostomy) status (HCC) 07/13/2014  . Bipolar affective disorder (HCC) 02/15/2013  . Tardive dyskinesia 02/15/2013  . Essential hypertension, benign 02/15/2013  . COPD (chronic obstructive pulmonary disease) (HCC) 02/15/2013  . Constipation 02/15/2013  . Weight loss 02/15/2013  . Chronic pain 02/15/2013  . Dementia with behavioral disturbance 08/16/2012  . Pacemaker 08/16/2012    Orientation RESPIRATION BLADDER Height & Weight     Self  Normal Continent Weight: 158 lb 3.2 oz (71.759 kg) (bedscale) Height:     BEHAVIORAL SYMPTOMS/MOOD NEUROLOGICAL BOWEL NUTRITION  STATUS      Continent    AMBULATORY STATUS COMMUNICATION OF NEEDS Skin   Limited Assist Verbally  (No Cyanosis, mottled appearance with decreased capillary refill i.e. capillary refill greater than 2 seconds)                       Personal Care Assistance Level of Assistance  Bathing, Feeding, Dressing Bathing Assistance: Limited assistance Feeding assistance: Limited assistance Dressing Assistance: Limited assistance     Functional Limitations Info  Sight, Hearing, Speech Sight Info: Adequate Hearing Info: Adequate Speech Info: Adequate    SPECIAL CARE FACTORS FREQUENCY                       Contractures      Additional Factors Info  Code Status Code Status Info: full             Current Medications (02/18/2016):  This is the current hospital active medication list Current Facility-Administered Medications  Medication Dose Route Frequency Provider Last Rate Last Dose  . acetaminophen (TYLENOL) tablet 650 mg  650 mg Oral Q6H PRN Russella DarAllison L Ellis, NP   650 mg at 02/16/16 0038   Or  . acetaminophen (TYLENOL) suppository 650 mg  650 mg Rectal Q6H PRN Russella DarAllison L Ellis, NP      . ampicillin-sulbactam (UNASYN) 1.5 g in sodium chloride 0.9 % 50 mL IVPB  1.5 g Intravenous Q6H Rhetta MuraJai-Gurmukh Samtani, MD   1.5 g at 02/18/16 0631  . memantine (NAMENDA XR) 24 hr capsule 28 mg  28 mg Oral QHS Shanker Levora DredgeM Ghimire, MD   28  mg at 02/17/16 2150   And  . donepezil (ARICEPT) tablet 10 mg  10 mg Oral QHS Maretta Bees, MD   10 mg at 02/17/16 2150  . DULoxetine (CYMBALTA) DR capsule 60 mg  60 mg Oral Daily Maretta Bees, MD   60 mg at 02/17/16 1500  . famotidine (PEPCID) tablet 20 mg  20 mg Oral BID Maretta Bees, MD   20 mg at 02/17/16 2150  . feeding supplement (OSMOLITE 1.5 CAL) liquid 1,000 mL  1,000 mL Per Tube Q24H Maretta Bees, MD 80 mL/hr at 02/17/16 1800 1,000 mL at 02/17/16 1800  . free water 100 mL  100 mL Per Tube 3 times per day Rhetta Mura, MD   100  mL at 02/18/16 0600  . furosemide (LASIX) tablet 20 mg  20 mg Oral Daily Shanker Levora Dredge, MD      . insulin aspart (novoLOG) injection 0-15 Units  0-15 Units Subcutaneous 6 times per day Chilton Greathouse, MD   2 Units at 02/16/16 0030  . ipratropium-albuterol (DUONEB) 0.5-2.5 (3) MG/3ML nebulizer solution 3 mL  3 mL Nebulization TID Praveen Mannam, MD   3 mL at 02/18/16 0845  . ipratropium-albuterol (DUONEB) 0.5-2.5 (3) MG/3ML nebulizer solution 3 mL  3 mL Nebulization Q3H PRN Praveen Mannam, MD      . levothyroxine (SYNTHROID, LEVOTHROID) tablet 125 mcg  125 mcg Per Tube QAC breakfast Maretta Bees, MD   125 mcg at 02/18/16 0631  . loperamide (IMODIUM) 1 MG/5ML solution 2 mg  2 mg Per Tube PRN Shanker Levora Dredge, MD      . polyvinyl alcohol (LIQUIFILM TEARS) 1.4 % ophthalmic solution 1 drop  1 drop Both Eyes BID Chilton Greathouse, MD   1 drop at 02/17/16 2151  . potassium chloride 20 MEQ/15ML (10%) solution 40 mEq  40 mEq Oral Daily Shanker Levora Dredge, MD      . QUEtiapine (SEROQUEL) tablet 50 mg  50 mg Oral QPM Rhetta Mura, MD   50 mg at 02/17/16 1800  . sodium chloride flush (NS) 0.9 % injection 10-40 mL  10-40 mL Intracatheter PRN Maretta Bees, MD   10 mL at 02/18/16 0457  . sodium chloride flush (NS) 0.9 % injection 3 mL  3 mL Intravenous Q12H Russella Dar, NP   3 mL at 02/17/16 2200  . valproic acid (DEPAKENE) 250 MG capsule 500 mg  500 mg Oral BID Maretta Bees, MD   500 mg at 02/17/16 1800     Discharge Medications: Please see discharge summary for a list of discharge medications.  Relevant Imaging Results:  Relevant Lab Results:   Additional Information SS# [id number]  Catheryn Bacon  BSW intern  [phone]

## 2016-02-18 NOTE — Progress Notes (Signed)
[facility] Anticipated discharge date:4/6 Transportation by [facility]- called at 4:15pm  CSW signing off.  Merlyn LotJenna Holoman, LCSWA Clinical Social Worker [phone]

## 2016-02-19 ENCOUNTER — Inpatient Hospital Stay (HOSPITAL_COMMUNITY)
Admission: EM | Admit: 2016-02-19 | Discharge: 2016-02-25 | DRG: 871 | Disposition: A | Discharge status: Skilled Nursing Facility | Payer: Medicare Other | Attending: Internal Medicine | Admitting: Internal Medicine

## 2016-02-19 ENCOUNTER — Encounter (HOSPITAL_COMMUNITY): Payer: Self-pay

## 2016-02-19 ENCOUNTER — Emergency Department (HOSPITAL_COMMUNITY): Payer: Medicare Other

## 2016-02-19 DIAGNOSIS — I5033 Acute on chronic diastolic (congestive) heart failure: Secondary | ICD-10-CM | POA: Diagnosis present

## 2016-02-19 DIAGNOSIS — R1310 Dysphagia, unspecified: Secondary | ICD-10-CM | POA: Diagnosis present

## 2016-02-19 DIAGNOSIS — G8929 Other chronic pain: Secondary | ICD-10-CM

## 2016-02-19 DIAGNOSIS — J440 Chronic obstructive pulmonary disease with acute lower respiratory infection: Secondary | ICD-10-CM | POA: Diagnosis present

## 2016-02-19 DIAGNOSIS — E039 Hypothyroidism, unspecified: Secondary | ICD-10-CM | POA: Diagnosis present

## 2016-02-19 DIAGNOSIS — F319 Bipolar disorder, unspecified: Secondary | ICD-10-CM | POA: Diagnosis present

## 2016-02-19 DIAGNOSIS — G2401 Drug induced subacute dyskinesia: Secondary | ICD-10-CM | POA: Diagnosis present

## 2016-02-19 DIAGNOSIS — D638 Anemia in other chronic diseases classified elsewhere: Secondary | ICD-10-CM | POA: Diagnosis present

## 2016-02-19 DIAGNOSIS — J690 Pneumonitis due to inhalation of food and vomit: Secondary | ICD-10-CM | POA: Diagnosis present

## 2016-02-19 DIAGNOSIS — G9340 Encephalopathy, unspecified: Secondary | ICD-10-CM | POA: Diagnosis present

## 2016-02-19 DIAGNOSIS — J441 Chronic obstructive pulmonary disease with (acute) exacerbation: Secondary | ICD-10-CM | POA: Diagnosis present

## 2016-02-19 DIAGNOSIS — A419 Sepsis, unspecified organism: Secondary | ICD-10-CM | POA: Diagnosis present

## 2016-02-19 DIAGNOSIS — I472 Ventricular tachycardia: Secondary | ICD-10-CM | POA: Diagnosis present

## 2016-02-19 DIAGNOSIS — R627 Adult failure to thrive: Secondary | ICD-10-CM

## 2016-02-19 DIAGNOSIS — R0682 Tachypnea, not elsewhere classified: Secondary | ICD-10-CM | POA: Diagnosis present

## 2016-02-19 DIAGNOSIS — R509 Fever, unspecified: Secondary | ICD-10-CM | POA: Diagnosis present

## 2016-02-19 DIAGNOSIS — Z950 Presence of cardiac pacemaker: Secondary | ICD-10-CM | POA: Diagnosis not present

## 2016-02-19 DIAGNOSIS — E870 Hyperosmolality and hypernatremia: Secondary | ICD-10-CM | POA: Diagnosis present

## 2016-02-19 DIAGNOSIS — Z87891 Personal history of nicotine dependence: Secondary | ICD-10-CM | POA: Diagnosis not present

## 2016-02-19 DIAGNOSIS — F0391 Unspecified dementia with behavioral disturbance: Secondary | ICD-10-CM | POA: Diagnosis not present

## 2016-02-19 DIAGNOSIS — J189 Pneumonia, unspecified organism: Secondary | ICD-10-CM | POA: Diagnosis present

## 2016-02-19 DIAGNOSIS — I4891 Unspecified atrial fibrillation: Secondary | ICD-10-CM | POA: Diagnosis present

## 2016-02-19 DIAGNOSIS — I10 Essential (primary) hypertension: Secondary | ICD-10-CM | POA: Diagnosis not present

## 2016-02-19 DIAGNOSIS — Z931 Gastrostomy status: Secondary | ICD-10-CM | POA: Diagnosis not present

## 2016-02-19 DIAGNOSIS — J449 Chronic obstructive pulmonary disease, unspecified: Secondary | ICD-10-CM | POA: Diagnosis present

## 2016-02-19 DIAGNOSIS — I110 Hypertensive heart disease with heart failure: Secondary | ICD-10-CM | POA: Diagnosis present

## 2016-02-19 DIAGNOSIS — I5032 Chronic diastolic (congestive) heart failure: Secondary | ICD-10-CM | POA: Diagnosis not present

## 2016-02-19 DIAGNOSIS — Y95 Nosocomial condition: Secondary | ICD-10-CM | POA: Diagnosis present

## 2016-02-19 DIAGNOSIS — F03918 Unspecified dementia, unspecified severity, with other behavioral disturbance: Secondary | ICD-10-CM | POA: Diagnosis present

## 2016-02-19 DIAGNOSIS — H540 Blindness, both eyes: Secondary | ICD-10-CM | POA: Diagnosis present

## 2016-02-19 LAB — COMPREHENSIVE METABOLIC PANEL
ALBUMIN: 2.7 g/dL — AB (ref 3.5–5.0)
ALK PHOS: 77 U/L (ref 38–126)
ALT: 34 U/L (ref 14–54)
AST: 49 U/L — AB (ref 15–41)
Anion gap: 12 (ref 5–15)
BILIRUBIN TOTAL: 0.3 mg/dL (ref 0.3–1.2)
BUN: 29 mg/dL — AB (ref 6–20)
CO2: 22 mmol/L (ref 22–32)
Calcium: 8.6 mg/dL — ABNORMAL LOW (ref 8.9–10.3)
Chloride: 113 mmol/L — ABNORMAL HIGH (ref 101–111)
Creatinine, Ser: 0.82 mg/dL (ref 0.44–1.00)
GFR calc Af Amer: >60 mL/min (ref >60)
GLUCOSE: 101 mg/dL — AB (ref 65–99)
POTASSIUM: 4.3 mmol/L (ref 3.5–5.1)
Sodium: 147 mmol/L — ABNORMAL HIGH (ref 135–145)
TOTAL PROTEIN: 6.2 g/dL — AB (ref 6.5–8.1)

## 2016-02-19 LAB — URINALYSIS, ROUTINE W REFLEX MICROSCOPIC
Bilirubin Urine: NEGATIVE
Glucose, UA: NEGATIVE mg/dL
Ketones, ur: NEGATIVE mg/dL
Nitrite: NEGATIVE
PROTEIN: 30 mg/dL — AB
SPECIFIC GRAVITY, URINE: 1.027 (ref 1.005–1.030)
pH: 6 (ref 5.0–8.0)

## 2016-02-19 LAB — CULTURE, BLOOD (ROUTINE X 2)
CULTURE: NO GROWTH
CULTURE: NO GROWTH

## 2016-02-19 LAB — CBC WITH DIFFERENTIAL/PLATELET
BASOS ABS: 0 10*3/uL (ref 0.0–0.1)
Basophils Relative: 0 %
Eosinophils Absolute: 0.5 10*3/uL (ref 0.0–0.7)
Eosinophils Relative: 3 %
HCT: 31.6 % — ABNORMAL LOW (ref 36.0–46.0)
HEMOGLOBIN: 10.2 g/dL — AB (ref 12.0–15.0)
LYMPHS ABS: 1.8 10*3/uL (ref 0.7–4.0)
Lymphocytes Relative: 11 %
MCH: 34.7 pg — AB (ref 26.0–34.0)
MCHC: 32.3 g/dL (ref 30.0–36.0)
MCV: 107.5 fL — AB (ref 78.0–100.0)
MONO ABS: 0.7 10*3/uL (ref 0.1–1.0)
MONOS PCT: 4 %
NEUTROS ABS: 13.3 10*3/uL — AB (ref 1.7–7.7)
Neutrophils Relative %: 82 %
PLATELETS: 261 10*3/uL (ref 150–400)
RBC: 2.94 MIL/uL — ABNORMAL LOW (ref 3.87–5.11)
RDW: 15.7 % — ABNORMAL HIGH (ref 11.5–15.5)
WBC: 16.3 10*3/uL — ABNORMAL HIGH (ref 4.0–10.5)

## 2016-02-19 LAB — I-STAT CG4 LACTIC ACID, ED: LACTIC ACID, VENOUS: 3.84 mmol/L — AB (ref 0.5–2.0)

## 2016-02-19 LAB — URINE MICROSCOPIC-ADD ON

## 2016-02-19 LAB — MRSA PCR SCREENING: MRSA BY PCR: NEGATIVE

## 2016-02-19 LAB — PROTIME-INR
INR: 0.98 (ref 0.00–1.49)
PROTHROMBIN TIME: 12.8 s (ref 11.6–15.2)

## 2016-02-19 LAB — LACTIC ACID, PLASMA: LACTIC ACID, VENOUS: 7.8 mmol/L — AB (ref 0.5–2.0)

## 2016-02-19 LAB — PROCALCITONIN

## 2016-02-19 LAB — APTT: aPTT: 20 seconds — ABNORMAL LOW (ref 24–37)

## 2016-02-19 LAB — BRAIN NATRIURETIC PEPTIDE: B NATRIURETIC PEPTIDE 5: 211.6 pg/mL — AB (ref 0.0–100.0)

## 2016-02-19 MED ORDER — SODIUM CHLORIDE 0.9 % IV BOLUS (SEPSIS)
1000.0000 mL | Freq: Once | INTRAVENOUS | Status: AC
  Administered 2016-02-19: 1000 mL via INTRAVENOUS

## 2016-02-19 MED ORDER — MEMANTINE HCL-DONEPEZIL HCL ER 28-10 MG PO CP24: 1.0000 | ORAL_CAPSULE | Freq: Every day | ORAL | Status: DC

## 2016-02-19 MED ORDER — LOPERAMIDE HCL 1 MG/5ML PO LIQD
2.0000 mg | ORAL | Status: DC | PRN
  Administered 2016-02-20: 2 mg
  Filled 2016-02-19 (×3): qty 10

## 2016-02-19 MED ORDER — IPRATROPIUM-ALBUTEROL 0.5-2.5 (3) MG/3ML IN SOLN
3.0000 mL | RESPIRATORY_TRACT | Status: DC
  Administered 2016-02-19 (×2): 3 mL via RESPIRATORY_TRACT
  Filled 2016-02-19 (×2): qty 3

## 2016-02-19 MED ORDER — CEFEPIME HCL 1 G IJ SOLR: 1.0000 g | Freq: Three times a day (TID) | INTRAMUSCULAR | Status: DC

## 2016-02-19 MED ORDER — DEXTROSE 5 % IV SOLN
2.0000 g | Freq: Once | INTRAVENOUS | Status: AC
  Administered 2016-02-19: 2 g via INTRAVENOUS
  Filled 2016-02-19: qty 2

## 2016-02-19 MED ORDER — FREE WATER
100.0000 mL | Freq: Three times a day (TID) | Status: DC
  Administered 2016-02-19 – 2016-02-25 (×18): 100 mL

## 2016-02-19 MED ORDER — OXYCODONE HCL 5 MG PO TABS
5.0000 mg | ORAL_TABLET | ORAL | Status: DC
  Administered 2016-02-19 – 2016-02-23 (×17): 5 mg
  Filled 2016-02-19 (×17): qty 1

## 2016-02-19 MED ORDER — HEPARIN SODIUM (PORCINE) 5000 UNIT/ML IJ SOLN
5000.0000 [IU] | Freq: Three times a day (TID) | INTRAMUSCULAR | Status: DC
  Administered 2016-02-19 – 2016-02-25 (×18): 5000 [IU] via SUBCUTANEOUS
  Filled 2016-02-19 (×23): qty 1

## 2016-02-19 MED ORDER — METHYLPREDNISOLONE SODIUM SUCC 125 MG IJ SOLR
60.0000 mg | Freq: Two times a day (BID) | INTRAMUSCULAR | Status: DC
  Administered 2016-02-20 – 2016-02-22 (×5): 60 mg via INTRAVENOUS
  Filled 2016-02-19 (×6): qty 2

## 2016-02-19 MED ORDER — DULOXETINE HCL 60 MG PO CPEP
60.0000 mg | ORAL_CAPSULE | Freq: Every day | ORAL | Status: DC
  Administered 2016-02-20 – 2016-02-23 (×4): 60 mg via ORAL
  Filled 2016-02-19: qty 1
  Filled 2016-02-19: qty 2
  Filled 2016-02-19: qty 1
  Filled 2016-02-19: qty 2
  Filled 2016-02-19: qty 1
  Filled 2016-02-19: qty 2

## 2016-02-19 MED ORDER — POLYETHYLENE GLYCOL 3350 17 G PO PACK: 17.0000 g | PACK | Freq: Every day | ORAL | Status: DC

## 2016-02-19 MED ORDER — DONEPEZIL HCL 10 MG PO TABS
10.0000 mg | ORAL_TABLET | Freq: Every day | ORAL | Status: DC
  Administered 2016-02-20 – 2016-02-24 (×5): 10 mg via ORAL
  Filled 2016-02-19 (×6): qty 1

## 2016-02-19 MED ORDER — SODIUM CHLORIDE 0.9 % IV BOLUS (SEPSIS)
1000.0000 mL | INTRAVENOUS | Status: AC
  Administered 2016-02-19: 1000 mL via INTRAVENOUS

## 2016-02-19 MED ORDER — VANCOMYCIN HCL IN DEXTROSE 1-5 GM/200ML-% IV SOLN
1000.0000 mg | Freq: Once | INTRAVENOUS | Status: AC
  Administered 2016-02-19: 1000 mg via INTRAVENOUS
  Filled 2016-02-19: qty 200

## 2016-02-19 MED ORDER — ADULT MULTIVITAMIN W/MINERALS CH
1.0000 | ORAL_TABLET | Freq: Every day | ORAL | Status: DC
  Administered 2016-02-20 – 2016-02-25 (×6): 1
  Filled 2016-02-19 (×6): qty 1

## 2016-02-19 MED ORDER — OXYCODONE HCL 5 MG PO TABS: 5.0000 mg | ORAL_TABLET | ORAL | Status: DC

## 2016-02-19 MED ORDER — POLYETHYLENE GLYCOL 3350 17 G PO PACK
17.0000 g | PACK | Freq: Every day | ORAL | Status: DC
  Administered 2016-02-20 – 2016-02-25 (×5): 17 g
  Filled 2016-02-19 (×7): qty 1

## 2016-02-19 MED ORDER — VANCOMYCIN HCL IN DEXTROSE 750-5 MG/150ML-% IV SOLN
750.0000 mg | Freq: Two times a day (BID) | INTRAVENOUS | Status: DC
  Administered 2016-02-20: 750 mg via INTRAVENOUS
  Filled 2016-02-19 (×3): qty 150

## 2016-02-19 MED ORDER — MEMANTINE HCL ER 28 MG PO CP24
28.0000 mg | ORAL_CAPSULE | Freq: Every day | ORAL | Status: DC
  Administered 2016-02-20 – 2016-02-24 (×5): 28 mg via ORAL
  Filled 2016-02-19 (×7): qty 1

## 2016-02-19 MED ORDER — SODIUM CHLORIDE 0.9% FLUSH
3.0000 mL | Freq: Two times a day (BID) | INTRAVENOUS | Status: DC
  Administered 2016-02-19 – 2016-02-24 (×6): 3 mL via INTRAVENOUS

## 2016-02-19 MED ORDER — DEXTROSE 5 % IV SOLN
2.0000 g | INTRAVENOUS | Status: DC
  Filled 2016-02-19: qty 2

## 2016-02-19 MED ORDER — METHYLPREDNISOLONE SODIUM SUCC 125 MG IJ SOLR
125.0000 mg | Freq: Once | INTRAMUSCULAR | Status: AC
  Administered 2016-02-19: 125 mg via INTRAVENOUS

## 2016-02-19 MED ORDER — SODIUM CHLORIDE 0.9 % IV SOLN
INTRAVENOUS | Status: AC
  Administered 2016-02-19: 21:00:00 via INTRAVENOUS

## 2016-02-19 MED ORDER — SODIUM CHLORIDE 0.9 % IV BOLUS (SEPSIS): 500.0000 mL | INTRAVENOUS | Status: AC

## 2016-02-19 MED ORDER — VALPROATE SODIUM 250 MG/5ML PO SYRP
500.0000 mg | ORAL_SOLUTION | Freq: Two times a day (BID) | ORAL | Status: DC
  Administered 2016-02-19 – 2016-02-25 (×12): 500 mg
  Filled 2016-02-19 (×14): qty 10

## 2016-02-19 MED ORDER — ALBUTEROL SULFATE (2.5 MG/3ML) 0.083% IN NEBU
INHALATION_SOLUTION | RESPIRATORY_TRACT | Status: AC
  Administered 2016-02-19: 2.5 mg
  Filled 2016-02-19: qty 6

## 2016-02-19 MED ORDER — LEVOTHYROXINE SODIUM 125 MCG PO TABS
125.0000 ug | ORAL_TABLET | Freq: Every day | ORAL | Status: DC
  Administered 2016-02-20 – 2016-02-25 (×6): 125 ug
  Filled 2016-02-19 (×7): qty 1

## 2016-02-19 MED ORDER — BUDESONIDE 0.25 MG/2ML IN SUSP
0.2500 mg | Freq: Two times a day (BID) | RESPIRATORY_TRACT | Status: DC
  Administered 2016-02-19 – 2016-02-25 (×11): 0.25 mg via RESPIRATORY_TRACT
  Filled 2016-02-19 (×12): qty 2

## 2016-02-19 MED ORDER — FAMOTIDINE 20 MG PO TABS
20.0000 mg | ORAL_TABLET | Freq: Two times a day (BID) | ORAL | Status: DC
  Administered 2016-02-19 – 2016-02-25 (×12): 20 mg
  Filled 2016-02-19 (×16): qty 1

## 2016-02-19 MED ORDER — QUETIAPINE FUMARATE 50 MG PO TABS
50.0000 mg | ORAL_TABLET | Freq: Every evening | ORAL | Status: DC
  Administered 2016-02-19 – 2016-02-24 (×6): 50 mg
  Filled 2016-02-19 (×7): qty 1

## 2016-02-19 MED ORDER — OSMOLITE 1.5 CAL PO LIQD
1000.0000 mL | ORAL | Status: DC
  Administered 2016-02-19 – 2016-02-25 (×7): 1000 mL
  Filled 2016-02-19 (×10): qty 1000

## 2016-02-19 MED ORDER — IPRATROPIUM-ALBUTEROL 0.5-2.5 (3) MG/3ML IN SOLN
3.0000 mL | Freq: Four times a day (QID) | RESPIRATORY_TRACT | Status: DC
  Administered 2016-02-20 – 2016-02-21 (×4): 3 mL via RESPIRATORY_TRACT
  Filled 2016-02-19 (×4): qty 3

## 2016-02-19 MED ORDER — DIVALPROEX SODIUM 500 MG PO DR TAB
500.0000 mg | DELAYED_RELEASE_TABLET | Freq: Two times a day (BID) | ORAL | Status: DC
  Filled 2016-02-19: qty 1

## 2016-02-19 NOTE — H&P (Signed)
History and Physical    [name] ZOX:[id number] DOB: 01/10/1928 DOA: 02/19/2016  Referring Cummings/NP/PA: Dr. Freida BusmanAllen PCP: Margit HanksALEXANDER, [name]  Patient coming from: SNF  Chief Complaint: Fever, altered mental status  HPI: [name] is a 82 y.o. female with medical history significant of advanced dementia, hypertension, COPD, hypothyroidism, chronic diastolic heart failure, dysphagia with PEG,  was recently admitted to the hospital and discharged yesterday with sepsis thought to be due to healthcare associated pneumonia/possible UTI, discharged back to skilled nursing, he is being brought again to the hospital due to fever of 101 as well as worsening mental status. Patient is nonverbal, lethargic in the ED so I'm unable to obtain any direct history. Per report, patient has had a fever today as well as difficulties breathing and shortness of breath, altered mental status, also requiring 15 L of oxygen. During her previous hospitalization, as per discharge summary, palliative care was consulted and had extensive discussions with the family regarding the patient. She remained full code per POA wishes. Dr. Freida BusmanAllen from ED discussed extensively with patient's son (POA), and she is to remain full code for now.  ED Course: Patient's vital signs are stable on admission, she is normotensive, has low-grade temperature of 99.8, she is to And heart rate is 96. She is satting 100% on a Venturi mask. Her blood work is significant for mild hypernatremia with a sodium 147, she has normal renal function, she has leukocytosis with a white count of 16 and an elevated lactic acid 3.8. Chest x-ray without acute findings  TRH was asked for admission to stepdown for sepsis from possible pulmonary source.  Review of Systems: Unable to obtain review of systems due to altered mental status   Past Medical History  Diagnosis Date  . COPD (chronic obstructive pulmonary disease) (HCC)   . Hypertension   . Blind   .  Hypothyroid   . Pacemaker   . Renal insufficiency   . Finger fracture   . Dementia   . Bipolar disorder (HCC)   . Dysphagia     needs thick nectar liquids  . Atrial fibrillation (HCC)   . Pneumonia   . Anxiety   . Diastolic heart failure [facility](HCC)     Past Surgical History  Procedure Laterality Date  . Insert / replace / remove pacemaker    . Tracheostomy  02/27/13    decannulated 03/29/13  . Gastrostomy tube placement  03/11/13     reports that she has quit smoking. Her smoking use included Cigarettes. She quit after .5 years of use. She does not have any smokeless tobacco history on file. She reports that she does not use illicit drugs. Her alcohol history is not on file.  No Known Allergies  Unable to obtain family history due to altered mental status  Prior to Admission medications   Medication Sig Start Date End Date Taking? Authorizing Provider  amoxicillin-clavulanate (AUGMENTIN) 500-125 MG tablet Place 1 tablet (500 mg total) into feeding tube 2 (two) times daily. For 2 more days from 4/6 02/18/16  Yes Shanker Levora DredgeM Ghimire, Cummings  beta carotene w/minerals (OCUVITE) tablet Place 1 tablet into feeding tube daily. 02/18/16  Yes Shanker Levora DredgeM Ghimire, Cummings  divalproex (DEPAKOTE) 500 MG DR tablet Take 500 mg by mouth 2 (two) times daily.   Yes Historical Provider, Cummings  docusate sodium (COLACE) 100 MG capsule Take 100 mg by mouth daily.   Yes Historical Provider, Cummings  DULoxetine (CYMBALTA) 60 MG capsule Take 60 mg by  mouth daily.   Yes Historical Provider, Cummings  famotidine (PEPCID) 20 MG tablet Place 1 tablet (20 mg total) into feeding tube 2 (two) times daily. 02/18/16  Yes Shanker Levora Dredge, Cummings  furosemide (LASIX) 20 MG tablet Place 1 tablet (20 mg total) into feeding tube daily. 02/18/16  Yes Shanker Levora Dredge, Cummings  ipratropium-albuterol (DUONEB) 0.5-2.5 (3) MG/3ML SOLN Take 3 mLs by nebulization 4 (four) times daily. Patient taking differently: Take 3 mLs by nebulization every 4 (four) hours.  02/18/16   Yes Shanker Levora Dredge, Cummings  ipratropium-albuterol (DUONEB) 0.5-2.5 (3) MG/3ML SOLN Take 3 mLs by nebulization every 3 (three) hours as needed. Patient taking differently: Take 3 mLs by nebulization every 3 (three) hours as needed (SOB, wheezing).  02/18/16  Yes Shanker Levora Dredge, Cummings  levothyroxine (SYNTHROID, LEVOTHROID) 125 MCG tablet Place 1 tablet (125 mcg total) into feeding tube daily before breakfast. Reported on 02/12/2016 02/18/16  Yes Shanker Levora Dredge, Cummings  lidocaine (LIDODERM) 5 % Place 1 patch onto the skin 2 (two) times daily. Apply 1/2 patch to each shoulder and to each knee for 12 hours, then remove   Yes Historical Provider, Cummings  loperamide (IMODIUM) 1 MG/5ML solution Place 10 mLs (2 mg total) into feeding tube as needed for diarrhea or loose stools. 02/18/16  Yes Shanker Levora Dredge, Cummings  Memantine HCl-Donepezil HCl (NAMZARIC) 28-10 MG CP24 1 capsule by PEG Tube route daily. 02/18/16  Yes Shanker Levora Dredge, Cummings  Multiple Vitamin (MULTIVITAMIN WITH MINERALS) TABS tablet Place 1 tablet into feeding tube daily. 02/18/16  Yes Shanker Levora Dredge, Cummings  oxyCODONE (OXY IR/ROXICODONE) 5 MG immediate release tablet Place 1 tablet (5 mg total) into feeding tube See admin instructions. Take one tablet by mouth every 4 hours for pain 02/18/16  Yes Shanker Levora Dredge, Cummings  OXYGEN Inhale 2 Imperial Gallon into the lungs continuous. Reported on 02/12/2016   Yes Historical Provider, Cummings  polyethylene glycol (MIRALAX / GLYCOLAX) packet Place 17 g into feeding tube daily. 02/18/16  Yes Shanker Levora Dredge, Cummings  Propylene Glycol (SYSTANE BALANCE) 0.6 % SOLN Place 1 drop into both eyes 2 (two) times daily.    Yes Historical Provider, Cummings  QUEtiapine (SEROQUEL) 50 MG tablet Place 1 tablet (50 mg total) into feeding tube every evening. 02/18/16  Yes Shanker Levora Dredge, Cummings  Water For Irrigation, Sterile (FREE WATER) SOLN Place 100 mLs into feeding tube every 8 (eight) hours. 02/18/16  Yes Shanker Levora Dredge, Cummings  Nutritional Supplements (FEEDING  SUPPLEMENT, OSMOLITE 1.5 CAL,) LIQD Place 1,000 mLs into feeding tube daily. Per Tube, at 80 mL/hr, Every 24 hours 02/18/16   Shanker Levora Dredge, Cummings  potassium chloride 20 MEQ/15ML (10%) SOLN Place 15 mLs (20 mEq total) into feeding tube daily. 02/18/16   Shanker Levora Dredge, Cummings    Physical Exam: Filed Vitals:   02/19/16 1506  BP: 126/106  Pulse: 96  Temp: 99.8 F (37.7 C)  TempSrc: Rectal  Resp: 33  SpO2: 100%      Constitutional: Lethargic, minimally responsive to voice commands  Filed Vitals:   02/19/16 1506  BP: 126/106  Pulse: 96  Temp: 99.8 F (37.7 C)  TempSrc: Rectal  Resp: 33  SpO2: 100%   Eyes: PERRL ENMT: Mucous membranes are moist.  Neck: supple, no thyromegaly Respiratory: Overall poor air movement, scattered wheezing on anterior auscultation, no crackles. No accessory muscle use. Cardiovascular: Regular rate and rhythm, no murmurs / rubs / gallops. 1+ lower extremity edema. 2+  pedal pulses.  Abdomen: no tenderness, no masses palpated. PEG tube in place Musculoskeletal: no clubbing / cyanosis.  Neurologic: Lethargic, appears to move all 4 extremities  Skin: no rashes Psychiatric: not following commands, AxOx0  Labs on Admission: I have personally reviewed following labs and imaging studies  CBC:  Recent Labs Lab 02/13/16 0445 02/14/16 0515 02/16/16 0348 02/17/16 0500 02/19/16 1539  WBC 6.8 6.6 10.9* 10.1 16.3*  NEUTROABS  --  5.6 9.5*  --  13.3*  HGB 9.4* 9.3* 9.3* 9.0* 10.2*  HCT 29.6* 29.4* 28.3* 26.6* 31.6*  MCV 106.5* 103.2* 98.3 100.8* 107.5*  PLT 131* 124* 170 174 261   Basic Metabolic Panel:  Recent Labs Lab 02/13/16 0445 02/14/16 0515 02/16/16 0348 02/17/16 0500 02/19/16 1539  NA 145 135 133* 141 147*  K 3.8 3.4* 2.6* 4.4 4.3  CL 114* 103 98* 108 113*  CO2 GLUCOSE 93 175* 107* 111* 101*  BUN 32* 29*  CREATININE 0.73 0.58 0.62 0.69 0.82  CALCIUM 7.1* 7.9* 8.0* 8.6* 8.6*  MG 2.0  --   --   --   --   PHOS  2.7  --   --   --   --    GFR: Estimated Creatinine Clearance: 49.8 mL/min (by C-G formula based on Cr of 0.82). Liver Function Tests:  Recent Labs Lab 02/13/16 0445 02/16/16 0348 02/17/16 0500 02/19/16 1539  AST 49*  ALT 9* 17 17 34  ALKPHOS 50 64 62 77  BILITOT 0.4 0.4 0.5 0.3  PROT 4.7* 5.4* 5.2* 6.2*  ALBUMIN 2.0* 2.2* 2.1* 2.7*   No results for input(s): LIPASE, AMYLASE in the last 168 hours. No results for input(s): AMMONIA in the last 168 hours. Coagulation Profile:  Recent Labs Lab 02/13/16 0445 02/14/16 0515 02/17/16 0500  INR 1.13 1.02 1.08   Cardiac Enzymes:  Recent Labs Lab 02/12/16 2109 02/13/16 0040 02/13/16 0930  TROPONINI 1.85* 0.30* 0.14*   BNP (last 3 results) No results for input(s): PROBNP in the last 8760 hours. HbA1C: No results for input(s): HGBA1C in the last 72 hours. CBG:  Recent Labs Lab 02/17/16 1638 02/17/16 2131 02/18/16 0131 02/18/16 0437 02/18/16 0818  GLUCAP 120* 120* 119* 123* 98   Lipid Profile: No results for input(s): CHOL, HDL, LDLCALC, TRIG, CHOLHDL, LDLDIRECT in the last 72 hours. Thyroid Function Tests: No results for input(s): TSH, T4TOTAL, FREET4, T3FREE, THYROIDAB in the last 72 hours. Anemia Panel: No results for input(s): VITAMINB12, FOLATE, FERRITIN, TIBC, IRON, RETICCTPCT in the last 72 hours. Urine analysis:    Component Value Date/Time   COLORURINE YELLOW 02/19/2016 1533   APPEARANCEUR HAZY* 02/19/2016 1533   LABSPEC 1.027 02/19/2016 1533   PHURINE 6.0 02/19/2016 1533   GLUCOSEU NEGATIVE 02/19/2016 1533   HGBUR TRACE* 02/19/2016 1533   BILIRUBINUR NEGATIVE 02/19/2016 1533   KETONESUR NEGATIVE 02/19/2016 1533   PROTEINUR 30* 02/19/2016 1533   UROBILINOGEN 1.0 09/22/2015 1900   NITRITE NEGATIVE 02/19/2016 1533   LEUKOCYTESUR SMALL* 02/19/2016 1533   Sepsis Labs: (procalcitonin:4,lacticidven:4) ) Recent Results (from the past 240 hour(s))  Culture, blood (Routine X 2) w  Reflex to ID Panel     Status: None   Collection Time: 02/12/16  2:40 PM  Result Value Ref Range Status   Specimen Description BLOOD LEFT ANTECUBITAL  Final   Special Requests BOTTLES DRAWN AEROBIC ONLY [facility]  Final   Culture  Setup Time   Final  GRAM POSITIVE COCCI IN CLUSTERS AEROBIC BOTTLE ONLY CRITICAL RESULT CALLED TO, READ BACK BY AND VERIFIED WITH: L PURCELL,RN AT 1135 02/13/16 BY B MARTIN    Culture   Final    STAPHYLOCOCCUS SPECIES (COAGULASE NEGATIVE) THE SIGNIFICANCE OF ISOLATING THIS ORGANISM FROM A SINGLE SET OF BLOOD CULTURES WHEN MULTIPLE SETS ARE DRAWN IS UNCERTAIN. PLEASE NOTIFY THE MICROBIOLOGY DEPARTMENT WITHIN ONE WEEK IF SPECIATION AND SENSITIVITIES ARE REQUIRED.    Report Status 02/15/2016 FINAL  Final  Culture, blood (Routine X 2) w Reflex to ID Panel     Status: None   Collection Time: 02/12/16  3:33 PM  Result Value Ref Range Status   Specimen Description BLOOD RIGHT ANTECUBITAL  Final   Special Requests BOTTLES DRAWN AEROBIC AND ANAEROBIC 5CC  Final   Culture NO GROWTH 5 DAYS  Final   Report Status 02/17/2016 FINAL  Final  Urine culture     Status: None   Collection Time: 02/12/16  3:34 PM  Result Value Ref Range Status   Specimen Description URINE, CATHETERIZED  Final   Special Requests NONE  Final   Culture MULTIPLE SPECIES PRESENT, SUGGEST RECOLLECTION  Final   Report Status 02/14/2016 FINAL  Final  MRSA PCR Screening     Status: None   Collection Time: 02/13/16  4:33 AM  Result Value Ref Range Status   MRSA by PCR NEGATIVE NEGATIVE Final    Comment:        The GeneXpert MRSA Assay (FDA approved for NASAL specimens only), is one component of a comprehensive MRSA colonization surveillance program. It is not intended to diagnose MRSA infection nor to guide or monitor treatment for MRSA infections.   Culture, blood (Routine X 2) w Reflex to ID Panel     Status: None   Collection Time: 02/14/16  5:12 PM  Result Value Ref Range Status   Specimen  Description BLOOD LEFT HAND  Final   Special Requests BOTTLES DRAWN AEROBIC ONLY 5CC  Final   Culture NO GROWTH 5 DAYS  Final   Report Status 02/19/2016 FINAL  Final  Culture, blood (Routine X 2) w Reflex to ID Panel     Status: None   Collection Time: 02/14/16  5:13 PM  Result Value Ref Range Status   Specimen Description BLOOD LEFT ANTECUBITAL  Final   Special Requests BOTTLES DRAWN AEROBIC ONLY 5CC  Final   Culture NO GROWTH 5 DAYS  Final   Report Status 02/19/2016 FINAL  Final     Radiological Exams on Admission: Dg Chest Port 1 View  02/19/2016  CLINICAL DATA:  82 year old female respiratory distress and diaphoresis. Initial encounter. EXAM: PORTABLE CHEST 1 VIEW COMPARISON:  02/16/2016 and earlier. FINDINGS: Portable AP semi upright view at at 1519 hours. Patient remains rotated to the right, limiting visualization of the right lung. Stable cardiomegaly and mediastinal contours. Left chest cardiac pacemaker appear stable. Stable ventilation. No pneumothorax, acute pulmonary edema, or pleural effusion identified. Gas containing gastric hiatal hernia appears mildly increased. IMPRESSION: Rotated to the right. Stable ventilation since 02/16/2016. Increased size of gas containing gastric hiatal hernia. Electronically Signed   By: Odessa Fleming M.D.   On: 02/19/2016 15:39    EKG: Independently reviewed. Sinus rhythm  Assessment/Plan Principal Problem:   Sepsis (HCC) Active Problems:   Dementia with behavioral disturbance   COPD (chronic obstructive pulmonary disease) (HCC)   FTT (failure to thrive) in adult   Chronic diastolic heart failure (HCC)   HCAP (healthcare-associated pneumonia)  Sepsis - As evidenced by fever, leukocytosis, tachypnea. Sources likely pulmonary HCAP vs. Aspiration PNA as patient is very high risk for aspiration as documented last time she was hospitalized.  - Admit to step down, provide IV fluids, repeat lactic acid after fluid resuscitation, vancomycin and  cefepime per pharmacy  Acute encephalopathy - Likely in the setting of #1 - Not great mental status at baseline to begin with  Failure to thrive/chronic dysphagia - Patient is status post PEG tube placement, per previous hospitalization after extensive discussion with the family and patient is also allowed a dysphagia 1 diet for comfort, and risks have been discussed extensively with family members in the past. Suspect this plays a big role in her recurrent aspiration. - We'll make patient nothing by mouth, continue feeding per PEG tube, speech pathologist to evaluate tomorrow  Acute on chronic diastolic heart failure - Monitor closely while receiving IV fluids for #1, she has chronic lower extremity edema, chest x-ray without significant fluid overload, no crackles on exam - 1 sepsis physiology improves, we will resume her home Lasix via PEG tube  Goals of care - It appears that extensive discussions were carried by my colleague Dr. Jerral Ralph up until yesterday, as well as Dr. Linna Darner with palliative care as well as Dr. Freida Busman from the ED with family members. On my evaluation there are no family members at bedside. She remains a full code. I'll readdress goals of care with family - Overall very poor prognosis, recurrent hospitalizations, clearly recurrent aspiration  Dementia with behavioral disturbances - Continue her home medications  COPD, likely with exacerbation - Patient with wheezing in the emergency room, provide steroids, provide nebulizer as well as antibiotics as above  Bipolar disorder - Continue home medication    DVT prophylaxis: heparin  Code Status: Full  Family Communication: no family bedside Disposition Plan: admit to SDU Consults called: none  Admission status: inpatient    Pamella Pert, Cummings [facility] Pager [phone]  If 7PM-7AM, please contact night-coverage [email or web address] Password [email or web address]  02/19/2016, 5:18 PM

## 2016-02-19 NOTE — ED Notes (Signed)
Bed: RESA Expected date:  Expected time:  Means of arrival:  Comments: EMS resp distress 

## 2016-02-19 NOTE — ED Notes (Signed)
[facility].  EMS called for respiratory distress and diaphoresis.  Patient tachypneic, and responsive to painful stimulus only.    Son at bedside states that patient was dc'ed from [facility] 1day ago and has "fluid on lungs".

## 2016-02-19 NOTE — ED Notes (Signed)
Please keep Son/POA Jiles Garter([place]) on [phone] posted on pt's progress.

## 2016-02-19 NOTE — Progress Notes (Signed)
Pharmacy Antibiotic Note  [name] is a 82 y.o. female admitted on 02/19/2016 with HCAP.  Patient noted to have been admitted and discharged from the hospital from 03/31 to 04/06 for sepsis.   Pharmacy has been consulted for vancomycin dosing.  Vancomycin 1gm IV x 1 given in ED.  Plan: Vancomycin 750mg  IV q12h Cefepime 2gm IV q24h (renally adjusted) Follow renal function, clinical course and cultures vanc trough at steady state as needed     Temp (24hrs), Avg:99.8 F (37.7 C), Min:99.8 F (37.7 C), Max:99.8 F (37.7 C)   Recent Labs Lab 02/12/16 2123 02/13/16 0105 02/13/16 0445 02/14/16 0515 02/16/16 0348 02/17/16 0500 02/19/16 1539 02/19/16 1548  WBC  --   --  6.8 6.6 10.9* 10.1 16.3*  --   CREATININE  --   --  0.73 0.58 0.62 0.69  --   --   LATICACIDVEN 1.87 1.7  --   --   --   --   --  3.84*    Estimated Creatinine Clearance: 51.1 mL/min (by C-G formula based on Cr of 0.69).    No Known Allergies  Antimicrobials this admission: 4/7 vanc 4/7 cefepime Dose adjustments this admission:   Microbiology results: 4/7 BCx: x2 4/7 UCx:   Thank you for allowing pharmacy to be a part of this patient's care.  Arley PhenixEllen Laakea Pereira RPh 02/19/2016, 4:11 PM Pager [phone]

## 2016-02-19 NOTE — Progress Notes (Signed)
Patient noted to have been admitted and discharged from the hospital from 03/31 to 04/06 for Sepsis.  [facility] with Palliative Care follow up.  Per chart review patient remained a full code at time of discharge.  Patient's POA is her son [name] [phone].  Patient presents to ED with shortness of breath requiring 15 liters of oxygen.

## 2016-02-19 NOTE — ED Notes (Signed)
UNABLE TO COLLECT LABS  AT THIS TIME 

## 2016-02-19 NOTE — ED Notes (Signed)
IV team at bedside 

## 2016-02-19 NOTE — ED Notes (Signed)
Allen, EDP made aware of patient lactic result. 

## 2016-02-19 NOTE — Progress Notes (Signed)
CRITICAL VALUE ALERT  Critical value received:  Lactic Acid: 7.8  Date of notification:  02/19/2016  Time of notification:  2312  Critical value read back:Yes.    Nurse who received alert:  Sharl MaBri Breeana Sawtelle, RN  MD notified (1st page):  Claiborne Billingsallahan  Time of first page: 2316  Time MD responded:  2320

## 2016-02-19 NOTE — ED Provider Notes (Signed)
CSN: [id number]     Arrival date & time 02/19/16  1451 History   First MD Initiated Contact with Patient 02/19/16 1506     Chief Complaint  Patient presents with  . Respiratory Distress     (Consider location/radiation/quality/duration/timing/severity/associated sxs/prior Treatment) HPI Comments: Pt here with altered mental status today--h/o dementia at baseline Was d/c from hospital yesterday after an admission for HCAP/UTI and sepsis--d/c home on augmentin Now w/ increased work of breathing ( h/o COPD) Ems called and pt hypoxic and placed on NRB and transported No further hx due to current condition     The history is provided by the EMS personnel and medical records. The history is limited by the condition of the patient.    Past Medical History  Diagnosis Date  . COPD (chronic obstructive pulmonary disease) (HCC)   . Hypertension   . Blind   . Hypothyroid   . Pacemaker   . Renal insufficiency   . Finger fracture   . Dementia   . Bipolar disorder (HCC)   . Dysphagia     needs thick nectar liquids  . Atrial fibrillation (HCC)   . Pneumonia   . Anxiety   . Diastolic heart failure [facility])    Past Surgical History  Procedure Laterality Date  . Insert / replace / remove pacemaker    . Tracheostomy  02/27/13    decannulated 03/29/13  . Gastrostomy tube placement  03/11/13   No family history on file. Social History  Substance Use Topics  . Smoking status: Former Smoker -- .5 years    Types: Cigarettes  . Smokeless tobacco: Not on file  . Alcohol Use: Not on file   OB History    No data available     Review of Systems  Unable to perform ROS: Acuity of condition      Allergies  Review of patient's allergies indicates no known allergies.  Home Medications   Prior to Admission medications   Medication Sig Start Date End Date Taking? Authorizing Provider  amoxicillin-clavulanate (AUGMENTIN) 500-125 MG tablet Place 1 tablet (500 mg total) into feeding tube 2  (two) times daily. For 2 more days from 4/6 02/18/16   Shanker Levora Dredge, MD  beta carotene w/minerals (OCUVITE) tablet Place 1 tablet into feeding tube daily. 02/18/16   Shanker Levora Dredge, MD  docusate sodium (COLACE) 100 MG capsule Take 100 mg by mouth daily.    Historical Provider, MD  DULoxetine (CYMBALTA) 60 MG capsule Take 60 mg by mouth daily.    Historical Provider, MD  famotidine (PEPCID) 20 MG tablet Place 1 tablet (20 mg total) into feeding tube 2 (two) times daily. 02/18/16   Shanker Levora Dredge, MD  furosemide (LASIX) 20 MG tablet Place 1 tablet (20 mg total) into feeding tube daily. 02/18/16   Shanker Levora Dredge, MD  ipratropium-albuterol (DUONEB) 0.5-2.5 (3) MG/3ML SOLN Take 3 mLs by nebulization 4 (four) times daily. 02/18/16   Shanker Levora Dredge, MD  ipratropium-albuterol (DUONEB) 0.5-2.5 (3) MG/3ML SOLN Take 3 mLs by nebulization every 3 (three) hours as needed. 02/18/16   Shanker Levora Dredge, MD  levothyroxine (SYNTHROID, LEVOTHROID) 125 MCG tablet Place 1 tablet (125 mcg total) into feeding tube daily before breakfast. Reported on 02/12/2016 02/18/16   Maretta Bees, MD  lidocaine (LIDODERM) 5 % Place 1 patch onto the skin 2 (two) times daily. Apply 1/2 patch to each shoulder and to each knee for 12 hours, then remove    Historical Provider, MD  loperamide (IMODIUM) 1 MG/5ML solution Place 10 mLs (2 mg total) into feeding tube as needed for diarrhea or loose stools. 02/18/16   Shanker Levora Dredge, MD  Memantine HCl-Donepezil HCl (NAMZARIC) 28-10 MG CP24 1 capsule by PEG Tube route daily. 02/18/16   Shanker Levora Dredge, MD  Multiple Vitamin (MULTIVITAMIN WITH MINERALS) TABS tablet Place 1 tablet into feeding tube daily. 02/18/16   Shanker Levora Dredge, MD  Nutritional Supplements (FEEDING SUPPLEMENT, OSMOLITE 1.5 CAL,) LIQD Place 1,000 mLs into feeding tube daily. Per Tube, at 80 mL/hr, Every 24 hours 02/18/16   Shanker Levora Dredge, MD  oxyCODONE (OXY IR/ROXICODONE) 5 MG immediate release tablet Place 1 tablet (5 mg  total) into feeding tube See admin instructions. Take one tablet by mouth every 4 hours for pain 02/18/16   Maretta Bees, MD  OXYGEN Inhale 2 Imperial Gallon into the lungs continuous. Reported on 02/12/2016    Historical Provider, MD  polyethylene glycol (MIRALAX / GLYCOLAX) packet Place 17 g into feeding tube daily. 02/18/16   Shanker Levora Dredge, MD  potassium chloride 20 MEQ/15ML (10%) SOLN Place 15 mLs (20 mEq total) into feeding tube daily. 02/18/16   Shanker Levora Dredge, MD  Propylene Glycol (SYSTANE BALANCE) 0.6 % SOLN Place 1 drop into both eyes 2 (two) times daily.     Historical Provider, MD  QUEtiapine (SEROQUEL) 50 MG tablet Place 1 tablet (50 mg total) into feeding tube every evening. 02/18/16   Shanker Levora Dredge, MD  UNABLE TO FIND Reported on 02/12/2016    Historical Provider, MD  Valproic Acid 500 MG CPDR Take 500 mg by mouth 2 (two) times daily.    Historical Provider, MD  Water For Irrigation, Sterile (FREE WATER) SOLN Place 100 mLs into feeding tube every 8 (eight) hours. 02/18/16   Shanker Levora Dredge, MD   BP 126/106 mmHg  Pulse 96  Temp(Src) 99.8 F (37.7 C) (Rectal)  Resp 33  SpO2 100% Physical Exam  Constitutional: She appears well-developed and well-nourished.  Non-toxic appearance. No distress.  HENT:  Head: Normocephalic and atraumatic.  Eyes: Conjunctivae, EOM and lids are normal. Pupils are equal, round, and reactive to light.  Neck: Normal range of motion. Neck supple. No tracheal deviation present. No thyroid mass present.  Cardiovascular: Normal rate, regular rhythm and normal heart sounds.  Exam reveals no gallop.   No murmur heard. Pulmonary/Chest: No stridor. She is in respiratory distress. She has decreased breath sounds. She has wheezes. She has no rhonchi. She has rales.  Abdominal: Soft. Normal appearance and bowel sounds are normal. She exhibits no distension. There is no tenderness. There is no rebound and no CVA tenderness.  Musculoskeletal: Normal range of  motion. She exhibits no edema or tenderness.  3 plus bilateral le edema  Neurological: She is unresponsive. She displays no tremor. No cranial nerve deficit. She displays no seizure activity. GCS eye subscore is 3. GCS verbal subscore is 2. GCS motor subscore is 4.  Skin: Skin is warm and dry. No abrasion and no rash noted.  Nursing note and vitals reviewed.   ED Course  Procedures (including critical care time) Labs Review Labs Reviewed  CULTURE, BLOOD (ROUTINE X 2)  CULTURE, BLOOD (ROUTINE X 2)  URINE CULTURE  COMPREHENSIVE METABOLIC PANEL  CBC WITH DIFFERENTIAL/PLATELET  URINALYSIS, ROUTINE W REFLEX MICROSCOPIC (NOT AT [facility])  BRAIN NATRIURETIC PEPTIDE  I-STAT CG4 LACTIC ACID, ED    Imaging Review No results found. I have personally reviewed and evaluated these images  and lab results as part of my medical decision-making.   EKG Interpretation   Date/Time:  Friday February 19 2016 15:08:29 EDT Ventricular Rate:  91 PR Interval:  129 QRS Duration: 97 QT Interval:  367 QTC Calculation: 451 R Axis:   20 Text Interpretation:  Sinus rhythm Anterior infarct, old Minimal ST  depression, inferior leads Confirmed by Abie Cheek  MD, Benetta Maclaren ([id number]) on  02/19/2016 3:25:08 PM      MDM   Final diagnoses:  None   Patient started on IV antibiotics as well as given Solu-Medrol and albuterol daily with treatment. Spoke to the patient's son at length and states that she does have some tardive dyskinesia. Patient's blood pressure has been stable here. Will be admitted to stepdown     Lorre NickAnthony Zeynab Klett, MD 02/19/16 1651

## 2016-02-20 LAB — COMPREHENSIVE METABOLIC PANEL
ALK PHOS: 64 U/L (ref 38–126)
ALT: 32 U/L (ref 14–54)
AST: 39 U/L (ref 15–41)
Albumin: 2.2 g/dL — ABNORMAL LOW (ref 3.5–5.0)
Anion gap: 13 (ref 5–15)
BILIRUBIN TOTAL: 0.4 mg/dL (ref 0.3–1.2)
BUN: 29 mg/dL — ABNORMAL HIGH (ref 6–20)
CALCIUM: 7.8 mg/dL — AB (ref 8.9–10.3)
CO2: 16 mmol/L — ABNORMAL LOW (ref 22–32)
CREATININE: 0.69 mg/dL (ref 0.44–1.00)
Chloride: 113 mmol/L — ABNORMAL HIGH (ref 101–111)
Glucose, Bld: 155 mg/dL — ABNORMAL HIGH (ref 65–99)
Potassium: 4.3 mmol/L (ref 3.5–5.1)
Sodium: 142 mmol/L (ref 135–145)
TOTAL PROTEIN: 5.2 g/dL — AB (ref 6.5–8.1)

## 2016-02-20 LAB — LACTIC ACID, PLASMA
LACTIC ACID, VENOUS: 7.3 mmol/L — AB (ref 0.5–2.0)
LACTIC ACID, VENOUS: 4.5 mmol/L — AB (ref 0.5–2.0)
Lactic Acid, Venous: 5 mmol/L (ref 0.5–2.0)

## 2016-02-20 LAB — CBC
HCT: 25.4 % — ABNORMAL LOW (ref 36.0–46.0)
Hemoglobin: 8.1 g/dL — ABNORMAL LOW (ref 12.0–15.0)
MCH: 35.2 pg — AB (ref 26.0–34.0)
MCHC: 31.9 g/dL (ref 30.0–36.0)
MCV: 110.4 fL — ABNORMAL HIGH (ref 78.0–100.0)
PLATELETS: 176 10*3/uL (ref 150–400)
RBC: 2.3 MIL/uL — AB (ref 3.87–5.11)
RDW: 15.8 % — ABNORMAL HIGH (ref 11.5–15.5)
WBC: 15.9 10*3/uL — AB (ref 4.0–10.5)

## 2016-02-20 MED ORDER — DEXTROSE 5 % IV SOLN
2.0000 g | Freq: Two times a day (BID) | INTRAVENOUS | Status: DC
  Administered 2016-02-20 – 2016-02-24 (×8): 2 g via INTRAVENOUS
  Filled 2016-02-20 (×9): qty 2

## 2016-02-20 MED ORDER — SODIUM CHLORIDE 0.9 % IV BOLUS (SEPSIS)
1000.0000 mL | Freq: Once | INTRAVENOUS | Status: AC
  Administered 2016-02-20: 1000 mL via INTRAVENOUS

## 2016-02-20 MED ORDER — SODIUM CHLORIDE 0.9 % IV SOLN: INTRAVENOUS | Status: AC

## 2016-02-20 NOTE — Progress Notes (Signed)
CRITICAL VALUE ALERT  Critical value received:  Lactic acid 4.5  Date of notification:  02/20/16  Time of notification:  2005  Critical value read back:Yes.    Nurse who received alert:  Max SaneMackenzie Trinaty Bundrick, RN  MD notified (1st page):  [facility] on call  Time of first page:  2100

## 2016-02-20 NOTE — Progress Notes (Signed)
CRITICAL VALUE ALERT  Critical value received:  Lacitc Acid: 7.3  Date of notification: 02/20/2016  Time of notification:  0238  Critical value read back:Yes.    Nurse who received alert:  Sharl MaBri Shirel Mallis, RN  MD notified (1st page): Claiborne Billingsallahan  Time of first page:  0240  Time MD responded:  [phone]

## 2016-02-20 NOTE — Evaluation (Signed)
Clinical/Bedside Swallow Evaluation Patient Details  Name: [name] MRN: [id number] Date of Birth: 02/15/1928  Today's Date: 02/20/2016 Time: SLP Start Time (ACUTE ONLY): 1445 SLP Stop Time (ACUTE ONLY): 1508 SLP Time Calculation (min) (ACUTE ONLY): 23 min  Past Medical History:  Past Medical History  Diagnosis Date  . COPD (chronic obstructive pulmonary disease) (HCC)   . Hypertension   . Blind   . Hypothyroid   . Pacemaker   . Renal insufficiency   . Finger fracture   . Dementia   . Bipolar disorder (HCC)   . Dysphagia     needs thick nectar liquids  . Atrial fibrillation (HCC)   . Pneumonia   . Anxiety   . Diastolic heart failure [facility](HCC)    Past Surgical History:  Past Surgical History  Procedure Laterality Date  . Insert / replace / remove pacemaker    . Tracheostomy  02/27/13    decannulated 03/29/13  . Gastrostomy tube placement  03/11/13   HPI:  82 y.o. female with medical history significant of advanced dementia, hypertension, COPD, hypothyroidism, chronic diastolic heart failure, dysphagia with PEG, was recently admitted to the hospital and discharged yesterday with sepsis thought to be due to healthcare associated pneumonia/possible UTI, discharged back to skilled nursing, he is being brought again to the hospital due to fever of 101 as well as worsening mental status; pt has had previous hospitalizations with PNA and dysphagia tx with Dysphagia 1/thin and swallowing and aspiration precautions utilized with minimal success.  CXR on  02/19/16 indicated Rotated to the right. Stable ventilation since 02/16/2016. Increased size of gas containing gastric hiatal hernia; right lung not easily seen during study; aspiration PNA suspected per nursing staff  Assessment / Plan / Recommendation Clinical Impression   Pt very impulsive/confused throughout BSE with frequent redirection/reassurance required to complete a partial OME and BSE; she would frequently yell out for "help"  during BSE. Although, pt appeared to tolerate initial intake of puree/ice chips; after multiple bites of puree, her vocal quality deteriorated, despite cues for repetitive swallows and throat clearing/coughing after intake; tsp amount of thin yielded multiple swallows and change in vocal quality; pt with a hx of dysphagia, decreased cognitive status and has PEG placement; recommend NPO until objective assessment is completed d/t moderate-severe aspiration risk with po intake.    Aspiration Risk  Moderate aspiration risk    Diet Recommendation   NPO  Medication Administration: Via alternative means    Other  Recommendations Oral Care Recommendations: Oral care QID   Follow up Recommendations  Skilled Nursing facility    Frequency and Duration min 2x/week  2 weeks       Prognosis Prognosis for Safe Diet Advancement: Fair Barriers to Reach Goals: Cognitive deficits;Behavior      Swallow Study   General Date of Onset: 02/19/16 HPI: 82 y.o. female with medical history significant of advanced dementia, hypertension, COPD, hypothyroidism, chronic diastolic heart failure, dysphagia with PEG, was recently admitted to the hospital and discharged yesterday with sepsis thought to be due to healthcare associated pneumonia/possible UTI, discharged back to skilled nursing, he is being brought again to the hospital due to fever of 101 as well as worsening mental status.  Type of Study: Bedside Swallow Evaluation Previous Swallow Assessment: See HPI Diet Prior to this Study: Regular;Thin liquids (diet in chart; nursing stated she was NPO) Temperature Spikes Noted: No Respiratory Status: Nasal cannula History of Recent Intubation: No Behavior/Cognition: Alert;Confused;Agitated;Impulsive Oral Cavity Assessment: Dry Oral Care Completed by  SLP: No Oral Cavity - Dentition: Edentulous Self-Feeding Abilities: Total assist Patient Positioning: Upright in bed Baseline Vocal Quality: Low vocal  intensity Volitional Cough: Weak Volitional Swallow: Unable to elicit    Oral/Motor/Sensory Function Overall Oral Motor/Sensory Function: Other (comment) (limited OME)   Ice Chips Ice chips: Impaired Presentation: Spoon Oral Phase Impairments: Poor awareness of bolus Oral Phase Functional Implications: Oral holding Pharyngeal Phase Impairments: Suspected delayed Swallow   Thin Liquid Thin Liquid: Impaired Presentation: Spoon Oral Phase Functional Implications: Right anterior spillage;Oral holding Pharyngeal  Phase Impairments: Suspected delayed Swallow;Wet Vocal Quality    Nectar Thick Nectar Thick Liquid: Not tested   Honey Thick Honey Thick Liquid: Not tested   Puree Puree: Impaired Presentation: Spoon Oral Phase Impairments: Reduced lingual movement/coordination;Other (comment) (impulsive initially) Oral Phase Functional Implications: Prolonged oral transit Pharyngeal Phase Impairments: Suspected delayed Swallow;Wet Vocal Quality Other Comments:  (vocal quality deteriorated after several bites)   Solid      Solid: Not tested        Jesse Hirst,PAT, M.S., CCC-SLP 02/20/2016,3:55 PM

## 2016-02-20 NOTE — Progress Notes (Signed)
PROGRESS NOTE  Melanie OmsClare Sprague JXB:[id number] DOB: 05/22/1928 DOA: 02/19/2016 PCP: Margit HanksALEXANDER, ANNE D, MD Outpatient Specialists:    LOS: 1 day   Brief Narrative: 82 y.o. female with medical history significant of advanced dementia, hypertension, COPD, hypothyroidism, chronic diastolic heart failure, dysphagia with PEG, was recently admitted to the hospital and discharged yesterday with sepsis thought to be due to healthcare associated pneumonia/possible UTI, discharged back to skilled nursing, he is being brought again to the hospital due to fever of 101 as well as worsening mental status  Assessment & Plan: Principal Problem:   Sepsis (HCC) Active Problems:   Dementia with behavioral disturbance   COPD (chronic obstructive pulmonary disease) (HCC)   FTT (failure to thrive) in adult   Chronic diastolic heart failure (HCC)   HCAP (healthcare-associated pneumonia)   Sepsis - As evidenced by fever, leukocytosis, tachypnea. Sources likely pulmonary HCAP vs. Aspiration PNA as patient is very high risk for aspiration despite the presence of the PEG tube - Sepsis physiology improving some, lactic acid increased up to 7.8, now decreasing to 5.0, continue fluids  Acute encephalopathy - Likely in the setting of #1 - Not great mental status at baseline to begin with - Improved today, she is more alert however nonverbal  Failure to thrive/chronic dysphagia - Patient is status post PEG tube placement, per previous hospitalization after extensive discussion with the family and patient is also allowed a dysphagia 1 diet for comfort, and risks have been discussed extensively with family members in the past. Suspect this plays a big role in her recurrent aspiration. - Continue NPO, SLP to evaluate  Acute on chronic diastolic heart failure - Monitor closely while receiving IV fluids for #1, she has chronic lower extremity edema, chest x-ray without significant fluid overload, no crackles on exam -  once sepsis physiology improves, we will resume her home Lasix via PEG tube, hopefully tomorrow  Goals of care - Discussed with son over the phone (POA), he wants to continue full scope of treatment - Extensive discussions was carried during her last hospitalization just a few days ago, palliative care was consulted at that time. We'll hold reconsulting palliative at this point since patient is very clear about patient's wishes for now. Will reconsider if she is to deteriorate, very high risk for that  Dementia with behavioral disturbances - Continue her home medications  COPD, likely with exacerbation - Wheezing has improved today, continue medications as above  Bipolar disorder - Continue home medication   DVT prophylaxis: heparin Code Status: Full code Family Communication: d/w son Gifford ShaveBeau (POA) over the phone [phone]) Disposition Plan: SNF when ready Barriers for discharge: sepsis  Consultants:   None   Procedures:   None   Antimicrobials:  Vancomycin 4/7 >>  Cefepime 4/7 >>   Subjective: - alert, non communicative  Objective: Filed Vitals:   02/20/16 0630 02/20/16 0800 02/20/16 0855 02/20/16 1000  BP: 142/82 124/68  119/57  Pulse: 92 86  87  Temp:  98.6 F (37 C)    TempSrc:  Axillary    Resp: 27 26  31   Height:      Weight:      SpO2: 97% 98% 95% 96%    Intake/Output Summary (Last 24 hours) at 02/20/16 1138 Last data filed at 02/20/16 1000  Gross per 24 hour  Intake 3880.34 ml  Output    340 ml  Net 3540.34 ml   Filed Weights   02/19/16 1930 02/20/16 0356  Weight: 70.9 kg (156  lb 4.9 oz) 73.2 kg (161 lb 6 oz)    Examination: Constitutional: NAD Filed Vitals:   02/20/16 0630 02/20/16 0800 02/20/16 0855 02/20/16 1000  BP: 142/82 124/68  119/57  Pulse: 92 86  87  Temp:  98.6 F (37 C)    TempSrc:  Axillary    Resp: 27 26  31   Height:      Weight:      SpO2: 97% 98% 95% 96%   Eyes: PERRL, lids and conjunctivae normal ENMT: Mucous  membranes are moist.  Neck: normal, supple Respiratory: Coarse breath sounds bilaterally, no wheezing, no crackles.   Cardiovascular: Regular rate and rhythm, no murmurs / rubs / gallops. No extremity edema. 2+ pedal pulses. Abdomen: no tenderness, no masses palpated. Musculoskeletal: no clubbing / cyanosis.  Skin: no rashes, lesions, ulcers. No induration Neurologic: alert, not following commands   Data Reviewed: I have personally reviewed following labs and imaging studies  CBC:  Recent Labs Lab 02/14/16 0515 02/16/16 0348 02/17/16 0500 02/19/16 1539 02/20/16 0159  WBC 6.6 10.9* 10.1 16.3* 15.9*  NEUTROABS 5.6 9.5*  --  13.3*  --   HGB 9.3* 9.3* 9.0* 10.2* 8.1*  HCT 29.4* 28.3* 26.6* 31.6* 25.4*  MCV 103.2* 98.3 100.8* 107.5* 110.4*  PLT 124* 170 174 261 176   Basic Metabolic Panel:  Recent Labs Lab 02/14/16 0515 02/16/16 0348 02/17/16 0500 02/19/16 1539 02/20/16 0159  NA 135 133* 141 147* 142  K 3.4* 2.6* 4.4 4.3 4.3  CL 103 98* 108 113* 113*  CO2 25 24 23 22  16*  GLUCOSE 175* 107* 111* 101* 155*  BUN 12 7 10  29* 29*  CREATININE 0.58 0.62 0.69 0.82 0.69  CALCIUM 7.9* 8.0* 8.6* 8.6* 7.8*   GFR: Estimated Creatinine Clearance: 51.5 mL/min (by C-G formula based on Cr of 0.69). Liver Function Tests:  Recent Labs Lab 02/16/16 0348 02/17/16 0500 02/19/16 1539 02/20/16 0159  AST 23 25 49* 39  ALT 17 17 34 32  ALKPHOS 64 62 77 64  BILITOT 0.4 0.5 0.3 0.4  PROT 5.4* 5.2* 6.2* 5.2*  ALBUMIN 2.2* 2.1* 2.7* 2.2*   No results for input(s): LIPASE, AMYLASE in the last 168 hours. No results for input(s): AMMONIA in the last 168 hours. Coagulation Profile:  Recent Labs Lab 02/14/16 0515 02/17/16 0500 02/19/16 1539  INR 1.02 1.08 0.98   Cardiac Enzymes: No results for input(s): CKTOTAL, CKMB, CKMBINDEX, TROPONINI in the last 168 hours. BNP (last 3 results) No results for input(s): PROBNP in the last 8760 hours. HbA1C: No results for input(s): HGBA1C in  the last 72 hours. CBG:  Recent Labs Lab 02/17/16 1638 02/17/16 2131 02/18/16 0131 02/18/16 0437 02/18/16 0818  GLUCAP 120* 120* 119* 123* 98   Lipid Profile: No results for input(s): CHOL, HDL, LDLCALC, TRIG, CHOLHDL, LDLDIRECT in the last 72 hours. Thyroid Function Tests: No results for input(s): TSH, T4TOTAL, FREET4, T3FREE, THYROIDAB in the last 72 hours. Anemia Panel: No results for input(s): VITAMINB12, FOLATE, FERRITIN, TIBC, IRON, RETICCTPCT in the last 72 hours. Urine analysis:    Component Value Date/Time   COLORURINE YELLOW 02/19/2016 1533   APPEARANCEUR HAZY* 02/19/2016 1533   LABSPEC 1.027 02/19/2016 1533   PHURINE 6.0 02/19/2016 1533   GLUCOSEU NEGATIVE 02/19/2016 1533   HGBUR TRACE* 02/19/2016 1533   BILIRUBINUR NEGATIVE 02/19/2016 1533   KETONESUR NEGATIVE 02/19/2016 1533   PROTEINUR 30* 02/19/2016 1533   UROBILINOGEN 1.0 09/22/2015 1900   NITRITE NEGATIVE 02/19/2016 1533   LEUKOCYTESUR  SMALL* 02/19/2016 1533   Sepsis Labs: Invalid input(s): PROCALCITONIN, LACTICIDVEN  Recent Results (from the past 240 hour(s))  Culture, blood (Routine X 2) w Reflex to ID Panel     Status: None   Collection Time: 02/12/16  2:40 PM  Result Value Ref Range Status   Specimen Description BLOOD LEFT ANTECUBITAL  Final   Special Requests BOTTLES DRAWN AEROBIC ONLY 7CC  Final   Culture  Setup Time   Final    GRAM POSITIVE COCCI IN CLUSTERS AEROBIC BOTTLE ONLY CRITICAL RESULT CALLED TO, READ BACK BY AND VERIFIED WITH: L PURCELL,RN AT 1135 02/13/16 BY B MARTIN    Culture   Final    STAPHYLOCOCCUS SPECIES (COAGULASE NEGATIVE) THE SIGNIFICANCE OF ISOLATING THIS ORGANISM FROM A SINGLE SET OF BLOOD CULTURES WHEN MULTIPLE SETS ARE DRAWN IS UNCERTAIN. PLEASE NOTIFY THE MICROBIOLOGY DEPARTMENT WITHIN ONE WEEK IF SPECIATION AND SENSITIVITIES ARE REQUIRED.    Report Status 02/15/2016 FINAL  Final  Culture, blood (Routine X 2) w Reflex to ID Panel     Status: None   Collection Time:  02/12/16  3:33 PM  Result Value Ref Range Status   Specimen Description BLOOD RIGHT ANTECUBITAL  Final   Special Requests BOTTLES DRAWN AEROBIC AND ANAEROBIC 5CC  Final   Culture NO GROWTH 5 DAYS  Final   Report Status 02/17/2016 FINAL  Final  Urine culture     Status: None   Collection Time: 02/12/16  3:34 PM  Result Value Ref Range Status   Specimen Description URINE, CATHETERIZED  Final   Special Requests NONE  Final   Culture MULTIPLE SPECIES PRESENT, SUGGEST RECOLLECTION  Final   Report Status 02/14/2016 FINAL  Final  MRSA PCR Screening     Status: None   Collection Time: 02/13/16  4:33 AM  Result Value Ref Range Status   MRSA by PCR NEGATIVE NEGATIVE Final    Comment:        The GeneXpert MRSA Assay (FDA approved for NASAL specimens only), is one component of a comprehensive MRSA colonization surveillance program. It is not intended to diagnose MRSA infection nor to guide or monitor treatment for MRSA infections.   Culture, blood (Routine X 2) w Reflex to ID Panel     Status: None   Collection Time: 02/14/16  5:12 PM  Result Value Ref Range Status   Specimen Description BLOOD LEFT HAND  Final   Special Requests BOTTLES DRAWN AEROBIC ONLY 5CC  Final   Culture NO GROWTH 5 DAYS  Final   Report Status 02/19/2016 FINAL  Final  Culture, blood (Routine X 2) w Reflex to ID Panel     Status: None   Collection Time: 02/14/16  5:13 PM  Result Value Ref Range Status   Specimen Description BLOOD LEFT ANTECUBITAL  Final   Special Requests BOTTLES DRAWN AEROBIC ONLY 5CC  Final   Culture NO GROWTH 5 DAYS  Final   Report Status 02/19/2016 FINAL  Final  Urine culture     Status: None (Preliminary result)   Collection Time: 02/19/16  3:33 PM  Result Value Ref Range Status   Specimen Description URINE, SUPRAPUBIC  Final   Special Requests NONE  Final   Culture   Final    NO GROWTH < 24 HOURS Performed at [facility]    Report Status PENDING  Incomplete  Blood Culture  (routine x 2)     Status: None (Preliminary result)   Collection Time: 02/19/16  3:37 PM  Result  Value Ref Range Status   Specimen Description BLOOD LEFT FOREARM  Final   Special Requests IN PEDIATRIC BOTTLE 3CC  Final   Culture   Final    NO GROWTH < 24 HOURS Performed at [facility]    Report Status PENDING  Incomplete  Blood Culture (routine x 2)     Status: None (Preliminary result)   Collection Time: 02/19/16  5:14 PM  Result Value Ref Range Status   Specimen Description BLOOD RIGHT FOOT  Final   Special Requests BOTTLES DRAWN AEROBIC AND ANAEROBIC 5CC  Final   Culture   Final    NO GROWTH < 24 HOURS Performed at [facility]    Report Status PENDING  Incomplete  MRSA PCR Screening     Status: None   Collection Time: 02/19/16  7:59 PM  Result Value Ref Range Status   MRSA by PCR NEGATIVE NEGATIVE Final    Comment:        The GeneXpert MRSA Assay (FDA approved for NASAL specimens only), is one component of a comprehensive MRSA colonization surveillance program. It is not intended to diagnose MRSA infection nor to guide or monitor treatment for MRSA infections.       Radiology Studies: Dg Chest Port 1 View  02/19/2016  CLINICAL DATA:  82 year old female respiratory distress and diaphoresis. Initial encounter. EXAM: PORTABLE CHEST 1 VIEW COMPARISON:  02/16/2016 and earlier. FINDINGS: Portable AP semi upright view at at 1519 hours. Patient remains rotated to the right, limiting visualization of the right lung. Stable cardiomegaly and mediastinal contours. Left chest cardiac pacemaker appear stable. Stable ventilation. No pneumothorax, acute pulmonary edema, or pleural effusion identified. Gas containing gastric hiatal hernia appears mildly increased. IMPRESSION: Rotated to the right. Stable ventilation since 02/16/2016. Increased size of gas containing gastric hiatal hernia. Electronically Signed   By: Odessa Fleming M.D.   On: 02/19/2016 15:39     Scheduled  Meds: . budesonide (PULMICORT) nebulizer solution  0.25 mg Nebulization BID  . ceFEPime (MAXIPIME) IV  2 g Intravenous Q24H  . memantine  28 mg Oral QHS   And  . donepezil  10 mg Oral QHS  . DULoxetine  60 mg Oral Daily  . famotidine  20 mg Per Tube BID  . feeding supplement (OSMOLITE 1.5 CAL)  1,000 mL Per Tube Q24H  . free water  100 mL Per Tube 3 times per day  . heparin  5,000 Units Subcutaneous 3 times per day  . ipratropium-albuterol  3 mL Nebulization QID  . levothyroxine  125 mcg Per Tube QAC breakfast  . methylPREDNISolone (SOLU-MEDROL) injection  60 mg Intravenous Q12H  . multivitamin with minerals  1 tablet Per Tube Daily  . oxyCODONE  5 mg Per Tube 6 times per day  . polyethylene glycol  17 g Per Tube Daily  . QUEtiapine  50 mg Per Tube QPM  . sodium chloride flush  3 mL Intravenous Q12H  . valproic acid  500 mg Per Tube BID  . vancomycin  750 mg Intravenous Q12H   Continuous Infusions: . sodium chloride 100 mL/hr at 02/20/16 1132     Pamella Pert, MD, PhD [facility] Pager [phone]  If 7PM-7AM, please contact night-coverage [email or web address] Password [facility] 02/20/2016, 11:38 AM

## 2016-02-20 NOTE — Progress Notes (Addendum)
Pharmacy Antibiotic Note  Melanie OmsClare Haddaway is a 82 y.o. female admitted on 02/19/2016 with sepsis 2/2 HCAP vs. aspiration PNA.  Patient noted to have been admitted and discharged from the hospital from 03/31 to 04/06 for sepsis.   Pharmacy has been consulted for vancomycin dosing and renal adjustment of all antibiotics.   Plan: Continue Vancomycin 750mg  IV q12h. Plan for Vancomycin trough level at steady state. Adjust Cefepime to 2g IV q12h per renal function/indication. Monitor renal function, cultures, clinical course.   Height: 5\' 5"  (165.1 cm) Weight: 161 lb 6 oz (73.2 kg) IBW/kg (Calculated) : 57  Temp (24hrs), Avg:98.7 F (37.1 C), Min:97.9 F (36.6 C), Max:99.8 F (37.7 C)   Recent Labs Lab 02/14/16 0515 02/16/16 0348 02/17/16 0500 02/19/16 1539 02/19/16 1548 02/19/16 2225 02/20/16 0159 02/20/16 0638  WBC 6.6 10.9* 10.1 16.3*  --   --  15.9*  --   CREATININE 0.58 0.62 0.69 0.82  --   --  0.69  --   LATICACIDVEN  --   --   --   --  3.84* 7.8* 7.3* 5.0*    Estimated Creatinine Clearance: 51.5 mL/min (by C-G formula based on Cr of 0.69).    No Known Allergies  Antimicrobials this admission: 4/7 >> Vancomycin >> 4/7 >> Cefepime >>  Dose adjustments this admission: 4/8: adjusted Cefepime to q12h per renal function  Microbiology results: 4/7 BCx x 2: NGTD 4/7 UCx: NGTD 4/7 MRSA PCR: negative  Thank you for allowing pharmacy to be a part of this patient's care.  Greer PickerelJigna Lewellyn Fultz, PharmD, BCPS Pager: [phone] 02/20/2016 2:02 PM

## 2016-02-20 NOTE — Progress Notes (Signed)
Pt was awake when I arrived. She said "please" and said she was hurting all over. CH offered further assistance and informed nurse of her comments regarding her pain. Please page if additional assistance is needed. Chaplain Marjory LiesPamela Carrington Holder, M.Div.   02/20/16 2300  Clinical Encounter Type  Visited With Patient

## 2016-02-21 ENCOUNTER — Inpatient Hospital Stay (HOSPITAL_COMMUNITY): Payer: Medicare Other

## 2016-02-21 DIAGNOSIS — I10 Essential (primary) hypertension: Secondary | ICD-10-CM

## 2016-02-21 LAB — COMPREHENSIVE METABOLIC PANEL
ALT: 55 U/L — AB (ref 14–54)
AST: 48 U/L — AB (ref 15–41)
Albumin: 2.3 g/dL — ABNORMAL LOW (ref 3.5–5.0)
Alkaline Phosphatase: 64 U/L (ref 38–126)
Anion gap: 12 (ref 5–15)
BILIRUBIN TOTAL: 0.3 mg/dL (ref 0.3–1.2)
BUN: 29 mg/dL — AB (ref 6–20)
CHLORIDE: 113 mmol/L — AB (ref 101–111)
CO2: 18 mmol/L — ABNORMAL LOW (ref 22–32)
CREATININE: 0.64 mg/dL (ref 0.44–1.00)
Calcium: 8.3 mg/dL — ABNORMAL LOW (ref 8.9–10.3)
Glucose, Bld: 152 mg/dL — ABNORMAL HIGH (ref 65–99)
POTASSIUM: 4.7 mmol/L (ref 3.5–5.1)
Sodium: 143 mmol/L (ref 135–145)
TOTAL PROTEIN: 5.4 g/dL — AB (ref 6.5–8.1)

## 2016-02-21 LAB — CBC
HCT: 23.5 % — ABNORMAL LOW (ref 36.0–46.0)
Hemoglobin: 7.6 g/dL — ABNORMAL LOW (ref 12.0–15.0)
MCH: 35 pg — ABNORMAL HIGH (ref 26.0–34.0)
MCHC: 32.3 g/dL (ref 30.0–36.0)
MCV: 108.3 fL — AB (ref 78.0–100.0)
Platelets: 138 10*3/uL — ABNORMAL LOW (ref 150–400)
RBC: 2.17 MIL/uL — AB (ref 3.87–5.11)
RDW: 16 % — AB (ref 11.5–15.5)
WBC: 16.2 10*3/uL — AB (ref 4.0–10.5)

## 2016-02-21 LAB — ECHOCARDIOGRAM COMPLETE
Height: 65 in
Weight: 2582.03 oz

## 2016-02-21 LAB — URINE CULTURE: CULTURE: NO GROWTH

## 2016-02-21 LAB — LACTIC ACID, PLASMA
LACTIC ACID, VENOUS: 3.5 mmol/L — AB (ref 0.5–2.0)
Lactic Acid, Venous: 4.3 mmol/L (ref 0.5–2.0)

## 2016-02-21 LAB — MAGNESIUM: MAGNESIUM: 2.1 mg/dL (ref 1.7–2.4)

## 2016-02-21 MED ORDER — IPRATROPIUM-ALBUTEROL 0.5-2.5 (3) MG/3ML IN SOLN
3.0000 mL | Freq: Three times a day (TID) | RESPIRATORY_TRACT | Status: DC
  Administered 2016-02-21 – 2016-02-25 (×13): 3 mL via RESPIRATORY_TRACT
  Filled 2016-02-21 (×13): qty 3

## 2016-02-21 MED ORDER — LIP MEDEX EX OINT
TOPICAL_OINTMENT | CUTANEOUS | Status: AC
  Administered 2016-02-21: 1
  Filled 2016-02-21: qty 7

## 2016-02-21 MED ORDER — PERFLUTREN LIPID MICROSPHERE
1.0000 mL | INTRAVENOUS | Status: AC | PRN
  Administered 2016-02-21: 2 mL via INTRAVENOUS
  Filled 2016-02-21: qty 10

## 2016-02-21 MED ORDER — METOPROLOL TARTRATE 12.5 MG HALF TABLET
12.5000 mg | ORAL_TABLET | Freq: Two times a day (BID) | ORAL | Status: DC
  Administered 2016-02-21 – 2016-02-25 (×9): 12.5 mg via ORAL
  Filled 2016-02-21 (×10): qty 1

## 2016-02-21 NOTE — Progress Notes (Signed)
CRITICAL VALUE ALERT  Critical value received:  Lactic acid 4.3  Date of notification:  02/21/16  Time of notification:  0216  Critical value read back:Yes.    Nurse who received alert:  Max SaneMackenzie Venida Tsukamoto, RN  MD notified (1st page):  [facility] on call

## 2016-02-21 NOTE — Progress Notes (Signed)
Pt has 3 episodes of SVT.  10 beat at 1154, 16 beat at 1221, and 21 and 1230.  Pt confused and unable to inform this RN if she was symptomatic but Vital signs remained stable.  Informed Dr. Elvera LennoxGherghe.  Erick Blinksuchman, Shakeisha Horine D, RN

## 2016-02-21 NOTE — Progress Notes (Signed)
PROGRESS NOTE  [name] BJY:[id number] DOB: 04/25/1928 DOA: 02/19/2016 PCP: Margit Hanks, MD Outpatient Specialists:    LOS: 2 days   Brief Narrative: 82 y.o. female with medical history significant of advanced dementia, hypertension, COPD, hypothyroidism, chronic diastolic heart failure, dysphagia with PEG, was recently admitted to the hospital and discharged yesterday with sepsis thought to be due to healthcare associated pneumonia/possible UTI, discharged back to skilled nursing, he is being brought again to the hospital due to fever of 101 as well as worsening mental status  Assessment & Plan: Principal Problem:   Sepsis (HCC) Active Problems:   Dementia with behavioral disturbance   COPD (chronic obstructive pulmonary disease) (HCC)   FTT (failure to thrive) in adult   Chronic diastolic heart failure (HCC)   HCAP (healthcare-associated pneumonia)   Sepsis - As evidenced by fever, leukocytosis, tachypnea. Sources likely pulmonary HCAP vs. Aspiration PNA as patient is very high risk for aspiration despite the presence of the PEG tube - Sepsis physiology improving  - Lactic acid is improving  Acute encephalopathy - Likely in the setting of #1 - Not great mental status at baseline to begin with - She is improving, answering questions however very confused  Failure to thrive/chronic dysphagia - Patient is status post PEG tube placement, per previous hospitalization after extensive discussion with the family and patient was also allowed a dysphagia 1 diet for comfort, and risks have been discussed extensively with family members in the past. Suspect this plays a big role in her recurrent aspiration. - Continue NPO, she is to be full scope of treatment, absolutely nothing to eat by mouth otherwise this is bound to happen again. Discussed with speech pathologist today  Acute on chronic diastolic heart failure - Monitor closely while receiving IV fluids for #1, she has  chronic lower extremity edema, chest x-ray without significant fluid overload, no crackles on exam - Hold fluids today, lactic acid is improved, renal function is normal, resume Lasix tomorrow  Goals of care - Discussed with son over the phone (POA) on 4/8, he wants to continue full scope of treatment - Extensive discussions was carried during her last hospitalization just a few days ago, palliative care was consulted at that time. We'll hold reconsulting palliative at this point since patient is very clear about patient's wishes for now. Will reconsider if she is to deteriorate, very high risk for that  Dementia with behavioral disturbances - Continue her home medications,   COPD, likely with exacerbation - Wheezing has resolved, continue medications as above  Bipolar disorder - Continue home medication   DVT prophylaxis: heparin Code Status: Full code Family Communication: d/w son Gifford Shave (POA) over the phone [phone]) on 4/8 Disposition Plan: SNF when ready Barriers for discharge: sepsis   Consultants:   None   Procedures:   None   Antimicrobials:  Vancomycin 4/7 >>  Cefepime 4/7 >>   Subjective: - alert, answering questions, denies any pain, oriented to person only  Objective: Filed Vitals:   02/21/16 0500 02/21/16 0600 02/21/16 0800 02/21/16 0943  BP:  131/86    Pulse: 86 89    Temp:   99 F (37.2 C)   TempSrc:   Oral   Resp: 19 22    Height:      Weight:      SpO2: 94% 93%  97%    Intake/Output Summary (Last 24 hours) at 02/21/16 1035 Last data filed at 02/21/16 1000  Gross per 24 hour  Intake  1700 ml  Output   1375 ml  Net    325 ml   Filed Weights   02/19/16 1930 02/20/16 0356  Weight: 70.9 kg (156 lb 4.9 oz) 73.2 kg (161 lb 6 oz)    Examination: Constitutional: NAD, alert Filed Vitals:   02/21/16 0500 02/21/16 0600 02/21/16 0800 02/21/16 0943  BP:  131/86    Pulse: 86 89    Temp:   99 F (37.2 C)   TempSrc:   Oral   Resp: 19 22     Height:      Weight:      SpO2: 94% 93%  97%   Eyes: PERRL, lids and conjunctivae normal ENMT: Mucous membranes are moist.  Neck: normal, supple Respiratory: Coarse breath sounds bilaterally, no wheezing, no crackles.   Cardiovascular: Regular rate and rhythm, no murmurs / rubs / gallops. Trace extremity edema. 2+ pedal pulses. Abdomen: no tenderness, no masses palpated. Musculoskeletal: no clubbing / cyanosis.  Neurologic: alert, intermittently following commands, exam appears nonfocal   Data Reviewed: I have personally reviewed following labs and imaging studies  CBC:  Recent Labs Lab 02/16/16 0348 02/17/16 0500 02/19/16 1539 02/20/16 0159 02/21/16 0048  WBC 10.9* 10.1 16.3* 15.9* 16.2*  NEUTROABS 9.5*  --  13.3*  --   --   HGB 9.3* 9.0* 10.2* 8.1* 7.6*  HCT 28.3* 26.6* 31.6* 25.4* 23.5*  MCV 98.3 100.8* 107.5* 110.4* 108.3*  PLT 170 174 261 176 138*   Basic Metabolic Panel:  Recent Labs Lab 02/16/16 0348 02/17/16 0500 02/19/16 1539 02/20/16 0159 02/21/16 0048  NA 133* 141 147* 142 143  K 2.6* 4.4 4.3 4.3 4.7  CL 98* 108 113* 113* 113*  CO2 24 23 22  16* 18*  GLUCOSE 107* 111* 101* 155* 152*  BUN 7 10 29* 29* 29*  CREATININE 0.62 0.69 0.82 0.69 0.64  CALCIUM 8.0* 8.6* 8.6* 7.8* 8.3*   GFR: Estimated Creatinine Clearance: 51.5 mL/min (by C-G formula based on Cr of 0.64). Liver Function Tests:  Recent Labs Lab 02/16/16 0348 02/17/16 0500 02/19/16 1539 02/20/16 0159 02/21/16 0048  AST 23 25 49* 39 48*  ALT 17 17 34 32 55*  ALKPHOS 64 62 77 64 64  BILITOT 0.4 0.5 0.3 0.4 0.3  PROT 5.4* 5.2* 6.2* 5.2* 5.4*  ALBUMIN 2.2* 2.1* 2.7* 2.2* 2.3*   No results for input(s): LIPASE, AMYLASE in the last 168 hours. No results for input(s): AMMONIA in the last 168 hours. Coagulation Profile:  Recent Labs Lab 02/17/16 0500 02/19/16 1539  INR 1.08 0.98   Cardiac Enzymes: No results for input(s): CKTOTAL, CKMB, CKMBINDEX, TROPONINI in the last 168  hours. BNP (last 3 results) No results for input(s): PROBNP in the last 8760 hours. HbA1C: No results for input(s): HGBA1C in the last 72 hours. CBG:  Recent Labs Lab 02/17/16 1638 02/17/16 2131 02/18/16 0131 02/18/16 0437 02/18/16 0818  GLUCAP 120* 120* 119* 123* 98   Lipid Profile: No results for input(s): CHOL, HDL, LDLCALC, TRIG, CHOLHDL, LDLDIRECT in the last 72 hours. Thyroid Function Tests: No results for input(s): TSH, T4TOTAL, FREET4, T3FREE, THYROIDAB in the last 72 hours. Anemia Panel: No results for input(s): VITAMINB12, FOLATE, FERRITIN, TIBC, IRON, RETICCTPCT in the last 72 hours. Urine analysis:    Component Value Date/Time   COLORURINE YELLOW 02/19/2016 1533   APPEARANCEUR HAZY* 02/19/2016 1533   LABSPEC 1.027 02/19/2016 1533   PHURINE 6.0 02/19/2016 1533   GLUCOSEU NEGATIVE 02/19/2016 1533   HGBUR TRACE* 02/19/2016  1533   BILIRUBINUR NEGATIVE 02/19/2016 1533   KETONESUR NEGATIVE 02/19/2016 1533   PROTEINUR 30* 02/19/2016 1533   UROBILINOGEN 1.0 09/22/2015 1900   NITRITE NEGATIVE 02/19/2016 1533   LEUKOCYTESUR SMALL* 02/19/2016 1533   Sepsis Labs: Invalid input(s): PROCALCITONIN, LACTICIDVEN  Recent Results (from the past 240 hour(s))  Culture, blood (Routine X 2) w Reflex to ID Panel     Status: None   Collection Time: 02/12/16  2:40 PM  Result Value Ref Range Status   Specimen Description BLOOD LEFT ANTECUBITAL  Final   Special Requests BOTTLES DRAWN AEROBIC ONLY 7CC  Final   Culture  Setup Time   Final    GRAM POSITIVE COCCI IN CLUSTERS AEROBIC BOTTLE ONLY CRITICAL RESULT CALLED TO, READ BACK BY AND VERIFIED WITH: L PURCELL,RN AT 1135 02/13/16 BY B MARTIN    Culture   Final    STAPHYLOCOCCUS SPECIES (COAGULASE NEGATIVE) THE SIGNIFICANCE OF ISOLATING THIS ORGANISM FROM A SINGLE SET OF BLOOD CULTURES WHEN MULTIPLE SETS ARE DRAWN IS UNCERTAIN. PLEASE NOTIFY THE MICROBIOLOGY DEPARTMENT WITHIN ONE WEEK IF SPECIATION AND SENSITIVITIES ARE REQUIRED.     Report Status 02/15/2016 FINAL  Final  Culture, blood (Routine X 2) w Reflex to ID Panel     Status: None   Collection Time: 02/12/16  3:33 PM  Result Value Ref Range Status   Specimen Description BLOOD RIGHT ANTECUBITAL  Final   Special Requests BOTTLES DRAWN AEROBIC AND ANAEROBIC 5CC  Final   Culture NO GROWTH 5 DAYS  Final   Report Status 02/17/2016 FINAL  Final  Urine culture     Status: None   Collection Time: 02/12/16  3:34 PM  Result Value Ref Range Status   Specimen Description URINE, CATHETERIZED  Final   Special Requests NONE  Final   Culture MULTIPLE SPECIES PRESENT, SUGGEST RECOLLECTION  Final   Report Status 02/14/2016 FINAL  Final  MRSA PCR Screening     Status: None   Collection Time: 02/13/16  4:33 AM  Result Value Ref Range Status   MRSA by PCR NEGATIVE NEGATIVE Final    Comment:        The GeneXpert MRSA Assay (FDA approved for NASAL specimens only), is one component of a comprehensive MRSA colonization surveillance program. It is not intended to diagnose MRSA infection nor to guide or monitor treatment for MRSA infections.   Culture, blood (Routine X 2) w Reflex to ID Panel     Status: None   Collection Time: 02/14/16  5:12 PM  Result Value Ref Range Status   Specimen Description BLOOD LEFT HAND  Final   Special Requests BOTTLES DRAWN AEROBIC ONLY 5CC  Final   Culture NO GROWTH 5 DAYS  Final   Report Status 02/19/2016 FINAL  Final  Culture, blood (Routine X 2) w Reflex to ID Panel     Status: None   Collection Time: 02/14/16  5:13 PM  Result Value Ref Range Status   Specimen Description BLOOD LEFT ANTECUBITAL  Final   Special Requests BOTTLES DRAWN AEROBIC ONLY 5CC  Final   Culture NO GROWTH 5 DAYS  Final   Report Status 02/19/2016 FINAL  Final  Urine culture     Status: None (Preliminary result)   Collection Time: 02/19/16  3:33 PM  Result Value Ref Range Status   Specimen Description URINE, SUPRAPUBIC  Final   Special Requests NONE  Final    Culture   Final    NO GROWTH < 24 HOURS [facility]    Report Status PENDING  Incomplete  Blood Culture (routine x 2)     Status: None (Preliminary result)   Collection Time: 02/19/16  3:37 PM  Result Value Ref Range Status   Specimen Description BLOOD LEFT FOREARM  Final   Special Requests IN PEDIATRIC BOTTLE 3CC  Final   Culture   Final    NO GROWTH < 24 HOURS Performed at [facility]    Report Status PENDING  Incomplete  Blood Culture (routine x 2)     Status: None (Preliminary result)   Collection Time: 02/19/16  5:14 PM  Result Value Ref Range Status   Specimen Description BLOOD RIGHT FOOT  Final   Special Requests BOTTLES DRAWN AEROBIC AND ANAEROBIC 5CC  Final   Culture   Final    NO GROWTH < 24 HOURS Performed at [facility]    Report Status PENDING  Incomplete  MRSA PCR Screening     Status: None   Collection Time: 02/19/16  7:59 PM  Result Value Ref Range Status   MRSA by PCR NEGATIVE NEGATIVE Final    Comment:        The GeneXpert MRSA Assay (FDA approved for NASAL specimens only), is one component of a comprehensive MRSA colonization surveillance program. It is not intended to diagnose MRSA infection nor to guide or monitor treatment for MRSA infections.       Radiology Studies: Dg Chest Port 1 View  02/19/2016  CLINICAL DATA:  82 year old female respiratory distress and diaphoresis. Initial encounter. EXAM: PORTABLE CHEST 1 VIEW COMPARISON:  02/16/2016 and earlier. FINDINGS: Portable AP semi upright view at at 1519 hours. Patient remains rotated to the right, limiting visualization of the right lung. Stable cardiomegaly and mediastinal contours. Left chest cardiac pacemaker appear stable. Stable ventilation. No pneumothorax, acute pulmonary edema, or pleural effusion identified. Gas containing gastric hiatal hernia appears mildly increased. IMPRESSION: Rotated to the right. Stable ventilation since 02/16/2016. Increased size  of gas containing gastric hiatal hernia. Electronically Signed   By: Odessa Fleming M.D.   On: 02/19/2016 15:39     Scheduled Meds: . budesonide (PULMICORT) nebulizer solution  0.25 mg Nebulization BID  . ceFEPime (MAXIPIME) IV  2 g Intravenous Q12H  . memantine  28 mg Oral QHS   And  . donepezil  10 mg Oral QHS  . DULoxetine  60 mg Oral Daily  . famotidine  20 mg Per Tube BID  . feeding supplement (OSMOLITE 1.5 CAL)  1,000 mL Per Tube Q24H  . free water  100 mL Per Tube 3 times per day  . heparin  5,000 Units Subcutaneous 3 times per day  . ipratropium-albuterol  3 mL Nebulization TID  . levothyroxine  125 mcg Per Tube QAC breakfast  . methylPREDNISolone (SOLU-MEDROL) injection  60 mg Intravenous Q12H  . multivitamin with minerals  1 tablet Per Tube Daily  . oxyCODONE  5 mg Per Tube 6 times per day  . polyethylene glycol  17 g Per Tube Daily  . QUEtiapine  50 mg Per Tube QPM  . sodium chloride flush  3 mL Intravenous Q12H  . valproic acid  500 mg Per Tube BID   Continuous Infusions:     Pamella Pert, MD, PhD [facility] Pager [phone]  If 7PM-7AM, please contact night-coverage [email or web address] Password [email or web address] 02/21/2016, 10:35 AM

## 2016-02-22 ENCOUNTER — Encounter: Payer: Self-pay | Admitting: Internal Medicine

## 2016-02-22 LAB — COMPREHENSIVE METABOLIC PANEL
ALT: 43 U/L (ref 14–54)
ANION GAP: 10 (ref 5–15)
AST: 42 U/L — ABNORMAL HIGH (ref 15–41)
Albumin: 2.3 g/dL — ABNORMAL LOW (ref 3.5–5.0)
Alkaline Phosphatase: 65 U/L (ref 38–126)
BUN: 34 mg/dL — ABNORMAL HIGH (ref 6–20)
CHLORIDE: 106 mmol/L (ref 101–111)
CO2: 25 mmol/L (ref 22–32)
Calcium: 8.3 mg/dL — ABNORMAL LOW (ref 8.9–10.3)
Creatinine, Ser: 0.7 mg/dL (ref 0.44–1.00)
Glucose, Bld: 135 mg/dL — ABNORMAL HIGH (ref 65–99)
POTASSIUM: 4.9 mmol/L (ref 3.5–5.1)
SODIUM: 141 mmol/L (ref 135–145)
Total Bilirubin: 0.7 mg/dL (ref 0.3–1.2)
Total Protein: 5.4 g/dL — ABNORMAL LOW (ref 6.5–8.1)

## 2016-02-22 LAB — CBC
HCT: 23.4 % — ABNORMAL LOW (ref 36.0–46.0)
HEMOGLOBIN: 7.5 g/dL — AB (ref 12.0–15.0)
MCH: 34.9 pg — ABNORMAL HIGH (ref 26.0–34.0)
MCHC: 32.1 g/dL (ref 30.0–36.0)
MCV: 108.8 fL — AB (ref 78.0–100.0)
PLATELETS: 174 10*3/uL (ref 150–400)
RBC: 2.15 MIL/uL — AB (ref 3.87–5.11)
RDW: 17 % — ABNORMAL HIGH (ref 11.5–15.5)
WBC: 17.2 10*3/uL — AB (ref 4.0–10.5)

## 2016-02-22 LAB — STREP PNEUMONIAE URINARY ANTIGEN: STREP PNEUMO URINARY ANTIGEN: NEGATIVE

## 2016-02-22 MED ORDER — PREDNISONE 20 MG PO TABS
20.0000 mg | ORAL_TABLET | Freq: Two times a day (BID) | ORAL | Status: DC
  Administered 2016-02-22 – 2016-02-25 (×6): 20 mg via ORAL
  Filled 2016-02-22 (×8): qty 1

## 2016-02-22 MED ORDER — FUROSEMIDE 20 MG PO TABS
20.0000 mg | ORAL_TABLET | Freq: Every day | ORAL | Status: DC
  Administered 2016-02-22 – 2016-02-25 (×4): 20 mg
  Filled 2016-02-22 (×4): qty 1

## 2016-02-22 MED ORDER — CETYLPYRIDINIUM CHLORIDE 0.05 % MT LIQD
7.0000 mL | Freq: Two times a day (BID) | OROMUCOSAL | Status: DC
  Administered 2016-02-22 – 2016-02-25 (×7): 7 mL via OROMUCOSAL

## 2016-02-22 NOTE — Progress Notes (Signed)
Unable to get a hold of the patient's son by telephone.  The message said, "This number is unavailable".  [facility], and notified her that the patient is moving to room 1503.

## 2016-02-22 NOTE — Progress Notes (Signed)
Unable to complete the admission history.  The patient unable to answer the questions accurately at this time.  No family members are present today.

## 2016-02-22 NOTE — Progress Notes (Signed)
This encounter was created in error - please disregard.

## 2016-02-22 NOTE — Progress Notes (Signed)
PROGRESS NOTE  [name] HQI:[id number] DOB: 1928-04-08 DOA: 02/19/2016 PCP: Margit Hanks, MD Outpatient Specialists:    LOS: 3 days   Brief Narrative: 82 y.o. female with medical history significant of advanced dementia, hypertension, COPD, hypothyroidism, chronic diastolic heart failure, dysphagia with PEG, was recently admitted to the hospital and discharged yesterday with sepsis thought to be due to healthcare associated pneumonia/possible UTI, discharged back to skilled nursing, he is being brought again to the hospital due to fever of 101 as well as worsening mental status  Assessment & Plan: Principal Problem:   Sepsis (HCC) Active Problems:   Dementia with behavioral disturbance   COPD (chronic obstructive pulmonary disease) (HCC)   FTT (failure to thrive) in adult   Chronic diastolic heart failure (HCC)   HCAP (healthcare-associated pneumonia)   Sepsis - As evidenced by fever, leukocytosis, tachypnea. Sources likely pulmonary HCAP vs. Aspiration PNA as patient is very high risk for aspiration despite the presence of the PEG tube - Sepsis physiology improving  - Lactic acid is improving - We'll transfer to the floor today  Acute encephalopathy - Likely in the setting of #1 - Not great mental status at baseline to begin with - She is improving, answering questions however very confused which is likely her baseline  Failure to thrive/chronic dysphagia - Patient is status post PEG tube placement, per previous hospitalization after extensive discussion with the family and patient was also allowed a dysphagia 1 diet for comfort, and risks have been discussed extensively with family members in the past. Suspect this plays a big role in her recurrent aspiration. - Continue NPO, if she is to be full scope of treatment as per POA, absolutely nothing to eat by mouth otherwise this is bound to happen again.   NSVT - on 4/9, obtained 2D echo, normal EF, keep K>4 Mg>2 -  added low dose Metoprolol, continue - no further episodes  Acute on chronic diastolic heart failure - Monitored closely while receiving IV fluids for #1, she has chronic lower extremity edema, chest x-ray without significant fluid overload, no crackles on exam - resume Lasix today, off of IVF  Goals of care - Discussed with son over the phone (POA), he wants to continue full scope of treatment - Extensive discussions was carried during her last hospitalization just a few days ago, palliative care was consulted at that time. We'll hold reconsulting palliative at this point since patient is very clear about patient's wishes for now. Will reconsider if she is to deteriorate, very high risk for that  Dementia with behavioral disturbances - Continue her home medications,   COPD, likely with exacerbation - Wheezing has resolved, continue medications as above  Bipolar disorder - Continue home medication   DVT prophylaxis: heparin Code Status: Full code Family Communication: d/w son Gifford Shave (POA) over the phone [phone]) Disposition Plan: SNF when ready Barriers for discharge: sepsis   Consultants:   None   Procedures:   None   Antimicrobials:  Vancomycin 4/7 >> 4/9  Cefepime 4/7 >>   Subjective: - alert, answering questions, denies any pain, oriented to person only  Objective: Filed Vitals:   02/22/16 0600 02/22/16 0800 02/22/16 0833 02/22/16 1000  BP: 145/86 143/74    Pulse: 72 65  74  Temp:   98.2 F (36.8 C)   TempSrc:      Resp: Height:      Weight:      SpO2: 95% 94%  95%  Intake/Output Summary (Last 24 hours) at 02/22/16 1137 Last data filed at 02/22/16 0800  Gross per 24 hour  Intake   1940 ml  Output   1525 ml  Net    415 ml   Filed Weights   02/19/16 1930 02/20/16 0356  Weight: 70.9 kg (156 lb 4.9 oz) 73.2 kg (161 lb 6 oz)    Examination: Constitutional: NAD, alert Filed Vitals:   02/22/16 0600 02/22/16 0800 02/22/16 0833  02/22/16 1000  BP: 145/86 143/74    Pulse: 72 65  74  Temp:   98.2 F (36.8 C)   TempSrc:      Resp: 25 11  12   Height:      Weight:      SpO2: 95% 94%  95%   Eyes: PERRL, lids and conjunctivae normal ENMT: Mucous membranes are moist.  Neck: normal, supple Respiratory: Coarse breath sounds bilaterally, no wheezing, no crackles.   Cardiovascular: Regular rate and rhythm, no murmurs / rubs / gallops. Trace extremity edema. 2+ pedal pulses. Abdomen: no tenderness, no masses palpated. Musculoskeletal: no clubbing / cyanosis.  Neurologic: alert, intermittently following commands, exam appears nonfocal   Data Reviewed: I have personally reviewed following labs and imaging studies  CBC:  Recent Labs Lab 02/16/16 0348 02/17/16 0500 02/19/16 1539 02/20/16 0159 02/21/16 0048 02/22/16 0340  WBC 10.9* 10.1 16.3* 15.9* 16.2* 17.2*  NEUTROABS 9.5*  --  13.3*  --   --   --   HGB 9.3* 9.0* 10.2* 8.1* 7.6* 7.5*  HCT 28.3* 26.6* 31.6* 25.4* 23.5* 23.4*  MCV 98.3 100.8* 107.5* 110.4* 108.3* 108.8*  PLT 170 174 261 176 138* 174   Basic Metabolic Panel:  Recent Labs Lab 02/17/16 0500 02/19/16 1539 02/20/16 0159 02/21/16 0048 02/22/16 0340  NA 141 147* 142 143 141  K 4.4 4.3 4.3 4.7 4.9  CL 108 113* 113* 113* 106  CO2 23 22 16* 18* 25  GLUCOSE 111* 101* 155* 152* 135*  BUN 10 29* 29* 29* 34*  CREATININE 0.69 0.82 0.69 0.64 0.70  CALCIUM 8.6* 8.6* 7.8* 8.3* 8.3*  MG  --   --   --  2.1  --    GFR: Estimated Creatinine Clearance: 51.5 mL/min (by C-G formula based on Cr of 0.7). Liver Function Tests:  Recent Labs Lab 02/17/16 0500 02/19/16 1539 02/20/16 0159 02/21/16 0048 02/22/16 0340  AST 25 49* 39 48* 42*  ALT 17 34 32 55* 43  ALKPHOS 62 77 64 64 65  BILITOT 0.5 0.3 0.4 0.3 0.7  PROT 5.2* 6.2* 5.2* 5.4* 5.4*  ALBUMIN 2.1* 2.7* 2.2* 2.3* 2.3*   No results for input(s): LIPASE, AMYLASE in the last 168 hours. No results for input(s): AMMONIA in the last 168  hours. Coagulation Profile:  Recent Labs Lab 02/17/16 0500 02/19/16 1539  INR 1.08 0.98   Cardiac Enzymes: No results for input(s): CKTOTAL, CKMB, CKMBINDEX, TROPONINI in the last 168 hours. BNP (last 3 results) No results for input(s): PROBNP in the last 8760 hours. HbA1C: No results for input(s): HGBA1C in the last 72 hours. CBG:  Recent Labs Lab 02/17/16 1638 02/17/16 2131 02/18/16 0131 02/18/16 0437 02/18/16 0818  GLUCAP 120* 120* 119* 123* 98   Lipid Profile: No results for input(s): CHOL, HDL, LDLCALC, TRIG, CHOLHDL, LDLDIRECT in the last 72 hours. Thyroid Function Tests: No results for input(s): TSH, T4TOTAL, FREET4, T3FREE, THYROIDAB in the last 72 hours. Anemia Panel: No results for input(s): VITAMINB12, FOLATE, FERRITIN, TIBC, IRON, RETICCTPCT  in the last 72 hours. Urine analysis:    Component Value Date/Time   COLORURINE YELLOW 02/19/2016 1533   APPEARANCEUR HAZY* 02/19/2016 1533   LABSPEC 1.027 02/19/2016 1533   PHURINE 6.0 02/19/2016 1533   GLUCOSEU NEGATIVE 02/19/2016 1533   HGBUR TRACE* 02/19/2016 1533   BILIRUBINUR NEGATIVE 02/19/2016 1533   KETONESUR NEGATIVE 02/19/2016 1533   PROTEINUR 30* 02/19/2016 1533   UROBILINOGEN 1.0 09/22/2015 1900   NITRITE NEGATIVE 02/19/2016 1533   LEUKOCYTESUR SMALL* 02/19/2016 1533   Sepsis Labs: Invalid input(s): PROCALCITONIN, LACTICIDVEN  Recent Results (from the past 240 hour(s))  Culture, blood (Routine X 2) w Reflex to ID Panel     Status: None   Collection Time: 02/12/16  2:40 PM  Result Value Ref Range Status   Specimen Description BLOOD LEFT ANTECUBITAL  Final   Special Requests BOTTLES DRAWN AEROBIC ONLY 7CC  Final   Culture  Setup Time   Final    GRAM POSITIVE COCCI IN CLUSTERS AEROBIC BOTTLE ONLY CRITICAL RESULT CALLED TO, READ BACK BY AND VERIFIED WITH: L PURCELL,RN AT 1135 02/13/16 BY B MARTIN    Culture   Final    STAPHYLOCOCCUS SPECIES (COAGULASE NEGATIVE) THE SIGNIFICANCE OF ISOLATING THIS  ORGANISM FROM A SINGLE SET OF BLOOD CULTURES WHEN MULTIPLE SETS ARE DRAWN IS UNCERTAIN. PLEASE NOTIFY THE MICROBIOLOGY DEPARTMENT WITHIN ONE WEEK IF SPECIATION AND SENSITIVITIES ARE REQUIRED.    Report Status 02/15/2016 FINAL  Final  Culture, blood (Routine X 2) w Reflex to ID Panel     Status: None   Collection Time: 02/12/16  3:33 PM  Result Value Ref Range Status   Specimen Description BLOOD RIGHT ANTECUBITAL  Final   Special Requests BOTTLES DRAWN AEROBIC AND ANAEROBIC 5CC  Final   Culture NO GROWTH 5 DAYS  Final   Report Status 02/17/2016 FINAL  Final  Urine culture     Status: None   Collection Time: 02/12/16  3:34 PM  Result Value Ref Range Status   Specimen Description URINE, CATHETERIZED  Final   Special Requests NONE  Final   Culture MULTIPLE SPECIES PRESENT, SUGGEST RECOLLECTION  Final   Report Status 02/14/2016 FINAL  Final  MRSA PCR Screening     Status: None   Collection Time: 02/13/16  4:33 AM  Result Value Ref Range Status   MRSA by PCR NEGATIVE NEGATIVE Final    Comment:        The GeneXpert MRSA Assay (FDA approved for NASAL specimens only), is one component of a comprehensive MRSA colonization surveillance program. It is not intended to diagnose MRSA infection nor to guide or monitor treatment for MRSA infections.   Culture, blood (Routine X 2) w Reflex to ID Panel     Status: None   Collection Time: 02/14/16  5:12 PM  Result Value Ref Range Status   Specimen Description BLOOD LEFT HAND  Final   Special Requests BOTTLES DRAWN AEROBIC ONLY 5CC  Final   Culture NO GROWTH 5 DAYS  Final   Report Status 02/19/2016 FINAL  Final  Culture, blood (Routine X 2) w Reflex to ID Panel     Status: None   Collection Time: 02/14/16  5:13 PM  Result Value Ref Range Status   Specimen Description BLOOD LEFT ANTECUBITAL  Final   Special Requests BOTTLES DRAWN AEROBIC ONLY 5CC  Final   Culture NO GROWTH 5 DAYS  Final   Report Status 02/19/2016 FINAL  Final  Urine culture      Status: None  Collection Time: 02/19/16  3:33 PM  Result Value Ref Range Status   Specimen Description URINE, SUPRAPUBIC  Final   Special Requests NONE  Final   Culture   Final    NO GROWTH 2 DAYS Performed at [facility]    Report Status 02/21/2016 FINAL  Final  Blood Culture (routine x 2)     Status: None (Preliminary result)   Collection Time: 02/19/16  3:37 PM  Result Value Ref Range Status   Specimen Description BLOOD LEFT FOREARM  Final   Special Requests IN PEDIATRIC BOTTLE 3CC  Final   Culture   Final    NO GROWTH 2 DAYS Performed at [facility]    Report Status PENDING  Incomplete  Blood Culture (routine x 2)     Status: None (Preliminary result)   Collection Time: 02/19/16  5:14 PM  Result Value Ref Range Status   Specimen Description BLOOD RIGHT FOOT  Final   Special Requests BOTTLES DRAWN AEROBIC AND ANAEROBIC 5CC  Final   Culture   Final    NO GROWTH 2 DAYS Performed at [facility]    Report Status PENDING  Incomplete  MRSA PCR Screening     Status: None   Collection Time: 02/19/16  7:59 PM  Result Value Ref Range Status   MRSA by PCR NEGATIVE NEGATIVE Final    Comment:        The GeneXpert MRSA Assay (FDA approved for NASAL specimens only), is one component of a comprehensive MRSA colonization surveillance program. It is not intended to diagnose MRSA infection nor to guide or monitor treatment for MRSA infections.       Radiology Studies: No results found.   Scheduled Meds: . antiseptic oral rinse  7 mL Mouth Rinse BID  . budesonide (PULMICORT) nebulizer solution  0.25 mg Nebulization BID  . ceFEPime (MAXIPIME) IV  2 g Intravenous Q12H  . memantine  28 mg Oral QHS   And  . donepezil  10 mg Oral QHS  . DULoxetine  60 mg Oral Daily  . famotidine  20 mg Per Tube BID  . feeding supplement (OSMOLITE 1.5 CAL)  1,000 mL Per Tube Q24H  . free water  100 mL Per Tube 3 times per day  . heparin  5,000 Units Subcutaneous 3  times per day  . ipratropium-albuterol  3 mL Nebulization TID  . levothyroxine  125 mcg Per Tube QAC breakfast  . methylPREDNISolone (SOLU-MEDROL) injection  60 mg Intravenous Q12H  . metoprolol tartrate  12.5 mg Oral BID  . multivitamin with minerals  1 tablet Per Tube Daily  . oxyCODONE  5 mg Per Tube 6 times per day  . polyethylene glycol  17 g Per Tube Daily  . QUEtiapine  50 mg Per Tube QPM  . sodium chloride flush  3 mL Intravenous Q12H  . valproic acid  500 mg Per Tube BID   Continuous Infusions:     Pamella Pert, MD, PhD [facility] Pager [phone]  If 7PM-7AM, please contact night-coverage [email or web address] Password [facility] 02/22/2016, 11:37 AM

## 2016-02-23 LAB — COMPREHENSIVE METABOLIC PANEL
ALBUMIN: 2.3 g/dL — AB (ref 3.5–5.0)
ALK PHOS: 56 U/L (ref 38–126)
ALT: 29 U/L (ref 14–54)
ANION GAP: 10 (ref 5–15)
AST: 21 U/L (ref 15–41)
BILIRUBIN TOTAL: 0.5 mg/dL (ref 0.3–1.2)
BUN: 44 mg/dL — AB (ref 6–20)
CALCIUM: 8.4 mg/dL — AB (ref 8.9–10.3)
CO2: 30 mmol/L (ref 22–32)
CREATININE: 0.71 mg/dL (ref 0.44–1.00)
Chloride: 100 mmol/L — ABNORMAL LOW (ref 101–111)
GFR calc Af Amer: >60 mL/min (ref >60)
GFR calc non Af Amer: >60 mL/min (ref >60)
GLUCOSE: 114 mg/dL — AB (ref 65–99)
Potassium: 4.9 mmol/L (ref 3.5–5.1)
SODIUM: 140 mmol/L (ref 135–145)
Total Protein: 5.1 g/dL — ABNORMAL LOW (ref 6.5–8.1)

## 2016-02-23 LAB — CBC
HCT: 22.7 % — ABNORMAL LOW (ref 36.0–46.0)
Hemoglobin: 7.4 g/dL — ABNORMAL LOW (ref 12.0–15.0)
MCH: 34.7 pg — AB (ref 26.0–34.0)
MCHC: 32.6 g/dL (ref 30.0–36.0)
MCV: 106.6 fL — AB (ref 78.0–100.0)
PLATELETS: 204 10*3/uL (ref 150–400)
RBC: 2.13 MIL/uL — ABNORMAL LOW (ref 3.87–5.11)
RDW: 16.9 % — AB (ref 11.5–15.5)
WBC: 13.9 10*3/uL — ABNORMAL HIGH (ref 4.0–10.5)

## 2016-02-23 LAB — PREPARE RBC (CROSSMATCH)

## 2016-02-23 MED ORDER — OXYCODONE HCL 5 MG PO TABS
5.0000 mg | ORAL_TABLET | Freq: Four times a day (QID) | ORAL | Status: DC | PRN
  Administered 2016-02-23 – 2016-02-24 (×3): 5 mg
  Filled 2016-02-23 (×3): qty 1

## 2016-02-23 MED ORDER — SODIUM CHLORIDE 0.9 % IV SOLN
Freq: Once | INTRAVENOUS | Status: AC
  Administered 2016-02-23: 19:00:00 via INTRAVENOUS

## 2016-02-23 NOTE — Clinical Social Work Note (Signed)
Clinical Social Work Assessment  Patient Details  Name: [name] MRN: [id number] Date of Birth: 11/28/1927  Date of referral:  02/23/16               Reason for consult:  Facility Placement                Permission sought to share information with:    Permission granted to share information::     Name::        Agency::     Relationship::     Contact Information:     Housing/Transportation Living arrangements for the past 2 months:  [facility] Source of Information:  Adult Children Patient Interpreter Needed:  None Criminal Activity/Legal Involvement Pertinent to Current Situation/Hospitalization:  No - Comment as needed Significant Relationships:  Adult Children Lives with:  Facility Resident Do you feel safe going back to the place where you live?    Need for family participation in patient care:  Yes (Comment)  Care giving concerns:  Pt is long-term resident admitted from [facility].    Social Worker assessment / plan:  CSW notified of facility resident. BSW Intern reviewed chart stating pt is disoriented x4. BSW Intern went by pt room and no family was present. BSW Intern contacted pt son, [name] via telephone. Pt son stated pt has been a resident at [facility] for a couple of years. Pt son confirms plan is to dc back to [facility] after hospitalization.   CSW notified [facility] representative to confirm pt plans.   Pt son states pt will need nonemergency transport back to the facility.  CSW continuing to follow and assist with disposition needs when appropriate.   Employment status:  Retired Health and safety inspectornsurance information:  Medicare PT Recommendations:  Not assessed at this time, No Follow Up Information / Referral to community resources:     Patient/Family's Response to care:  Pt disoriented x4. Pt son is very involved in pt care and states he is the only one near this area. Pt son also inquired about pt dc date.   Patient/Family's Understanding  of and Emotional Response to Diagnosis, Current Treatment, and Prognosis:  Pt son reports no further questions at this time.   Emotional Assessment Appearance:  Appears stated age Attitude/Demeanor/Rapport:  Unable to Assess (Disoriented x4) Affect (typically observed):  Unable to Assess (Disoriented x4) Orientation:   (Disoriented x4) Alcohol / Substance use:  Not Applicable Psych involvement (Current and /or in the community):  No (Comment)  Discharge Needs  Concerns to be addressed:  No discharge needs identified Readmission within the last 30 days:  Yes Current discharge risk:  None Barriers to Discharge:  Continued Medical Work up   [employer] CorporationCallie Chaitra Mast, Student-SW 02/23/2016, 10:40 AM

## 2016-02-23 NOTE — Progress Notes (Addendum)
PROGRESS NOTE  [name] ZOX:[id number] DOB: Jul 09, 1928 DOA: 02/19/2016 PCP: Margit Hanks, MD Outpatient Specialists:    LOS: 4 days   Brief Narrative: 82 y.o. female with medical history significant of advanced dementia, hypertension, COPD, hypothyroidism, chronic diastolic heart failure, dysphagia with PEG, was recently admitted to the hospital and discharged yesterday with sepsis thought to be due to healthcare associated pneumonia/possible UTI, discharged back to skilled nursing, he is being brought again to the hospital due to fever of 101 as well as worsening mental status  Interim summary  Patient admitted on 4/7 with sepsis due to aspiration pneumonia approximately 1 day after discharge at SNF, likely due to another aspiration event. Patient was placed on broad spectrum antibiotics, initially was in SDU then transitioned to floor on 4/10. She is gradually improving,  Assessment & Plan: Principal Problem:   Sepsis (HCC) Active Problems:   Dementia with behavioral disturbance   COPD (chronic obstructive pulmonary disease) (HCC)   FTT (failure to thrive) in adult   Chronic diastolic heart failure (HCC)   HCAP (healthcare-associated pneumonia)   Sepsis - As evidenced by fever, leukocytosis, tachypnea. Sources likely pulmonary HCAP vs. Aspiration PNA as patient is very high risk for aspiration despite the presence of the PEG tube - Sepsis physiology resolved, patient now stable on the floor - Lactic acid is improving - white count improving from 17 on admission to 14 this morning  Acute encephalopathy - Likely in the setting of #1 - Not great mental status at baseline to begin with - She is improving, answering questions however very confused which is likely her baseline  Anemia - No bleeding, likely in the setting of chronic illness, hemoglobin progressively declining, 7.4 this morning, will transfuse 1 unit of packed red blood cells  Failure to thrive/chronic  dysphagia - Patient is status post PEG tube placement, per previous hospitalization after extensive discussion with the family and patient was also allowed a dysphagia 1 diet for comfort, and risks have been discussed extensively with family members in the past. Suspect this plays a big role in her recurrent aspiration. - Continue NPO, if she is to be full scope of treatment as per POA, absolutely nothing to eat by mouth otherwise this is bound to happen again.   NSVT - on 4/9, obtained 2D echo, normal EF, keep K>4 Mg>2 - added low dose Metoprolol, continue - no further episodes - Echo with normal systolic function with EF 65-70%  Acute on chronic diastolic heart failure - Monitored closely while receiving IV fluids for #1, she has chronic lower extremity edema, chest x-ray without significant fluid overload, no crackles on exam - resumed Lasix 2/10, off of IVF  Goals of care - Discussed with son over the phone (POA), he wants to continue full scope of treatment - Extensive discussions was carried during her last hospitalization just a few days ago, palliative care was consulted at that time. We'll hold reconsulting palliative at this point since patient is very clear about patient's wishes for now. Will reconsider if she is to deteriorate, very high risk for that  Dementia with behavioral disturbances - Continue her home medications,   COPD, likely with exacerbation - Wheezing has resolved, continue medications as above  Bipolar disorder - Continue home medication   DVT prophylaxis: heparin Code Status: Full code Family Communication: d/w son [name] (POA) over the phone [phone]) 4/11 Disposition Plan: SNF when ready, likely 2-3 days Barriers for discharge: sepsis, mental status changes   Consultants:  None   Procedures:   2-D echo  Antimicrobials:  Vancomycin 4/7 >> 4/9  Cefepime 4/7 >>   Subjective: - alert, answering questions, endorses pain "all over",  however she appears comfortable and sleeping  Objective: Filed Vitals:   02/23/16 0436 02/23/16 0857 02/23/16 1350 02/23/16 1356  BP: 117/55   101/56  Pulse: 64  65 65  Temp: 98 F (36.7 C)   98.1 F (36.7 C)  TempSrc: Oral   Oral  Resp: Height:      Weight:      SpO2: 100% 97% 93% 93%    Intake/Output Summary (Last 24 hours) at 02/23/16 1508 Last data filed at 02/23/16 0800  Gross per 24 hour  Intake   1320 ml  Output   1125 ml  Net    195 ml   Filed Weights   02/19/16 1930 02/20/16 0356  Weight: 70.9 kg (156 lb 4.9 oz) 73.2 kg (161 lb 6 oz)    Examination: Constitutional: NAD, alert Filed Vitals:   02/23/16 0436 02/23/16 0857 02/23/16 1350 02/23/16 1356  BP: 117/55   101/56  Pulse: 64  65 65  Temp: 98 F (36.7 C)   98.1 F (36.7 C)  TempSrc: Oral   Oral  Resp: Height:      Weight:      SpO2: 100% 97% 93% 93%   Eyes: PERRL, lids and conjunctivae normal ENMT: Mucous membranes are moist.  Neck: normal, supple Respiratory: Coarse breath sounds bilaterally, no wheezing, no crackles.   Cardiovascular: Regular rate and rhythm, no murmurs / rubs / gallops. Trace extremity edema. 2+ pedal pulses. Abdomen: no tenderness, no masses palpated. Musculoskeletal: no clubbing / cyanosis.  Neurologic: alert, intermittently following commands, exam appears nonfocal   Data Reviewed: I have personally reviewed following labs and imaging studies  CBC:  Recent Labs Lab 02/19/16 1539 02/20/16 0159 02/21/16 0048 02/22/16 0340 02/23/16 0533  WBC 16.3* 15.9* 16.2* 17.2* 13.9*  NEUTROABS 13.3*  --   --   --   --   HGB 10.2* 8.1* 7.6* 7.5* 7.4*  HCT 31.6* 25.4* 23.5* 23.4* 22.7*  MCV 107.5* 110.4* 108.3* 108.8* 106.6*  PLT 261 176 138* 174 204   Basic Metabolic Panel:  Recent Labs Lab 02/19/16 1539 02/20/16 0159 02/21/16 0048 02/22/16 0340 02/23/16 0533  NA 147* 142 143 141 140  K 4.3 4.3 4.7 4.9 4.9  CL 113* 113* 113* 106 100*  CO2 22  16* 18* 25 30  GLUCOSE 101* 155* 152* 135* 114*  BUN 29* 29* 29* 34* 44*  CREATININE 0.82 0.69 0.64 0.70 0.71  CALCIUM 8.6* 7.8* 8.3* 8.3* 8.4*  MG  --   --  2.1  --   --    GFR: Estimated Creatinine Clearance: 51.5 mL/min (by C-G formula based on Cr of 0.71). Liver Function Tests:  Recent Labs Lab 02/19/16 1539 02/20/16 0159 02/21/16 0048 02/22/16 0340 02/23/16 0533  AST 49* 39 48* 42* 21  ALT 34 32 55* 43 29  ALKPHOS 77 64 64 65 56  BILITOT 0.3 0.4 0.3 0.7 0.5  PROT 6.2* 5.2* 5.4* 5.4* 5.1*  ALBUMIN 2.7* 2.2* 2.3* 2.3* 2.3*   No results for input(s): LIPASE, AMYLASE in the last 168 hours. No results for input(s): AMMONIA in the last 168 hours. Coagulation Profile:  Recent Labs Lab 02/17/16 0500 02/19/16 1539  INR 1.08 0.98   Cardiac Enzymes: No results for input(s): CKTOTAL, CKMB,  CKMBINDEX, TROPONINI in the last 168 hours. BNP (last 3 results) No results for input(s): PROBNP in the last 8760 hours. HbA1C: No results for input(s): HGBA1C in the last 72 hours. CBG:  Recent Labs Lab 02/17/16 1638 02/17/16 2131 02/18/16 0131 02/18/16 0437 02/18/16 0818  GLUCAP 120* 120* 119* 123* 98   Lipid Profile: No results for input(s): CHOL, HDL, LDLCALC, TRIG, CHOLHDL, LDLDIRECT in the last 72 hours. Thyroid Function Tests: No results for input(s): TSH, T4TOTAL, FREET4, T3FREE, THYROIDAB in the last 72 hours. Anemia Panel: No results for input(s): VITAMINB12, FOLATE, FERRITIN, TIBC, IRON, RETICCTPCT in the last 72 hours. Urine analysis:    Component Value Date/Time   COLORURINE YELLOW 02/19/2016 1533   APPEARANCEUR HAZY* 02/19/2016 1533   LABSPEC 1.027 02/19/2016 1533   PHURINE 6.0 02/19/2016 1533   GLUCOSEU NEGATIVE 02/19/2016 1533   HGBUR TRACE* 02/19/2016 1533   BILIRUBINUR NEGATIVE 02/19/2016 1533   KETONESUR NEGATIVE 02/19/2016 1533   PROTEINUR 30* 02/19/2016 1533   UROBILINOGEN 1.0 09/22/2015 1900   NITRITE NEGATIVE 02/19/2016 1533   LEUKOCYTESUR  SMALL* 02/19/2016 1533   Sepsis Labs: Invalid input(s): PROCALCITONIN, LACTICIDVEN  Recent Results (from the past 240 hour(s))  Culture, blood (Routine X 2) w Reflex to ID Panel     Status: None   Collection Time: 02/14/16  5:12 PM  Result Value Ref Range Status   Specimen Description BLOOD LEFT HAND  Final   Special Requests BOTTLES DRAWN AEROBIC ONLY 5CC  Final   Culture NO GROWTH 5 DAYS  Final   Report Status 02/19/2016 FINAL  Final  Culture, blood (Routine X 2) w Reflex to ID Panel     Status: None   Collection Time: 02/14/16  5:13 PM  Result Value Ref Range Status   Specimen Description BLOOD LEFT ANTECUBITAL  Final   Special Requests BOTTLES DRAWN AEROBIC ONLY 5CC  Final   Culture NO GROWTH 5 DAYS  Final   Report Status 02/19/2016 FINAL  Final  Urine culture     Status: None   Collection Time: 02/19/16  3:33 PM  Result Value Ref Range Status   Specimen Description URINE, SUPRAPUBIC  Final   Special Requests NONE  Final   Culture   Final    NO GROWTH 2 DAYS Performed at [facility]    Report Status 02/21/2016 FINAL  Final  Blood Culture (routine x 2)     Status: None (Preliminary result)   Collection Time: 02/19/16  3:37 PM  Result Value Ref Range Status   Specimen Description BLOOD LEFT FOREARM  Final   Special Requests IN PEDIATRIC BOTTLE 3CC  Final   Culture   Final    NO GROWTH 4 DAYS Performed at [facility]    Report Status PENDING  Incomplete  Blood Culture (routine x 2)     Status: None (Preliminary result)   Collection Time: 02/19/16  5:14 PM  Result Value Ref Range Status   Specimen Description BLOOD RIGHT FOOT  Final   Special Requests BOTTLES DRAWN AEROBIC AND ANAEROBIC 5CC  Final   Culture   Final    NO GROWTH 4 DAYS Performed at [facility]    Report Status PENDING  Incomplete  MRSA PCR Screening     Status: None   Collection Time: 02/19/16  7:59 PM  Result Value Ref Range Status   MRSA by PCR NEGATIVE NEGATIVE Final     Comment:        The GeneXpert MRSA  Assay (FDA approved for NASAL specimens only), is one component of a comprehensive MRSA colonization surveillance program. It is not intended to diagnose MRSA infection nor to guide or monitor treatment for MRSA infections.       Radiology Studies: No results found.   Scheduled Meds: . antiseptic oral rinse  7 mL Mouth Rinse BID  . budesonide (PULMICORT) nebulizer solution  0.25 mg Nebulization BID  . ceFEPime (MAXIPIME) IV  2 g Intravenous Q12H  . memantine  28 mg Oral QHS   And  . donepezil  10 mg Oral QHS  . DULoxetine  60 mg Oral Daily  . famotidine  20 mg Per Tube BID  . feeding supplement (OSMOLITE 1.5 CAL)  1,000 mL Per Tube Q24H  . free water  100 mL Per Tube 3 times per day  . furosemide  20 mg Per Tube Daily  . heparin  5,000 Units Subcutaneous 3 times per day  . ipratropium-albuterol  3 mL Nebulization TID  . levothyroxine  125 mcg Per Tube QAC breakfast  . metoprolol tartrate  12.5 mg Oral BID  . multivitamin with minerals  1 tablet Per Tube Daily  . oxyCODONE  5 mg Per Tube 6 times per day  . polyethylene glycol  17 g Per Tube Daily  . predniSONE  20 mg Oral BID WC  . QUEtiapine  50 mg Per Tube QPM  . sodium chloride flush  3 mL Intravenous Q12H  . valproic acid  500 mg Per Tube BID   Continuous Infusions:    Time spent: 25 minutes  Pamella Pert, MD, PhD [facility] Pager [phone]  If 7PM-7AM, please contact night-coverage [email or web address] Password [facility] 02/23/2016, 3:08 PM

## 2016-02-23 NOTE — Care Management Important Message (Signed)
Important Message  Patient Details  Name: [name] MRN: [id number] Date of Birth: 06/24/1928   Medicare Important Message Given:  Yes    Haskell FlirtJamison, Masin Shatto 02/23/2016, 10:31 AMImportant Message  Patient Details  Name: [name] MRN: [id number] Date of Birth: 03/09/1928   Medicare Important Message Given:  Yes    Haskell FlirtJamison, Luda Charbonneau 02/23/2016, 10:31 AM

## 2016-02-23 NOTE — [place] FL2 (Signed)
[place] MEDICAID FL2 LEVEL OF CARE SCREENING TOOL     IDENTIFICATION  Patient Name: [name] Birthdate: [birth year] Sex: female Admission Date (Current Location): 02/19/2016  [facility] and [place] Number:  Producer, television/film/video and Address:  [facility],  [address], [place] [address]      Provider Number: [id number]  Attending Physician Name and Address:  Leatha Gilding, MD  Relative Name and Phone Number:       Current Level of Care: Hospital Recommended Level of Care: Skilled Nursing Facility Prior Approval Number:    Date Approved/Denied:   PASRR Number: [id number]  Discharge Plan: SNF    Current Diagnoses: Patient Active Problem List   Diagnosis Date Noted  . Sepsis (HCC) 02/19/2016  . Encounter for palliative care   . Goals of care, counseling/discussion   . Septic shock (HCC) 02/12/2016  . HCAP (healthcare-associated pneumonia) 02/12/2016  . AKI (acute kidney injury) (HCC) 02/12/2016  . Elevated troponin 02/12/2016  . Hypotension   . Dysphagia, pharyngoesophageal phase 12/29/2015  . Chronic diastolic heart failure (HCC) 10/29/2014  . Emphysema of lung (HCC) 09/17/2014  . Hypothyroidism due to acquired atrophy of thyroid 09/17/2014  . FTT (failure to thrive) in adult 07/13/2014  . UTI (urinary tract infection) 07/13/2014  . PEG (percutaneous endoscopic gastrostomy) status (HCC) 07/13/2014  . Bipolar affective disorder (HCC) 02/15/2013  . Tardive dyskinesia 02/15/2013  . Essential hypertension, benign 02/15/2013  . COPD (chronic obstructive pulmonary disease) (HCC) 02/15/2013  . Constipation 02/15/2013  . Weight loss 02/15/2013  . Chronic pain 02/15/2013  . Dementia with behavioral disturbance 08/16/2012  . Pacemaker 08/16/2012    Orientation RESPIRATION BLADDER Height & Weight      (Disoriented x4)  O2 (2 L/min) Incontinent Weight: 161 lb 6 oz (73.2 kg) Height:   (165.1 cm)  BEHAVIORAL SYMPTOMS/MOOD NEUROLOGICAL BOWEL  NUTRITION STATUS  Other (Comment) (None)  (None) Incontinent Diet (Low sodium heart healthy)  AMBULATORY STATUS COMMUNICATION OF NEEDS Skin     Verbally Other (Comment) (Skin tear; Location: Right Ear; Dressing: Foam; Non-tenting)                       Personal Care Assistance Level of Assistance  Bathing, Feeding, Dressing           Functional Limitations Info  Sight, Hearing, Speech Sight Info: Impaired Hearing Info: Impaired Speech Info: Adequate    SPECIAL CARE FACTORS FREQUENCY  PT (By licensed PT), OT (By licensed OT)     PT Frequency: 5 x week OT Frequency: 5 x week            Contractures Contractures Info: Present    Additional Factors Info  Code Status, Allergies Code Status Info: FULL Code Allergies Info: No Known Allergies           Current Medications (02/23/2016):  This is the current hospital active medication list Current Facility-Administered Medications  Medication Dose Route Frequency Provider Last Rate Last Dose  . antiseptic oral rinse (CPC / CETYLPYRIDINIUM CHLORIDE 0.05%) solution 7 mL  7 mL Mouth Rinse BID Costin Otelia Sergeant, MD   7 mL at 02/23/16 1021  . budesonide (PULMICORT) nebulizer solution 0.25 mg  0.25 mg Nebulization BID Leatha Gilding, MD   0.25 mg at 02/23/16 0856  . ceFEPIme (MAXIPIME) 2 g in dextrose 5 % 50 mL IVPB  2 g Intravenous Q12H Leatha Gilding, MD   2 g at 02/23/16 0350  .  memantine (NAMENDA XR) 24 hr capsule 28 mg  28 mg Oral QHS Cleda MccreedyPaola Gehrig, MD   28 mg at 02/22/16 2300   And  . donepezil (ARICEPT) tablet 10 mg  10 mg Oral QHS Cleda MccreedyPaola Gehrig, MD   10 mg at 02/22/16 2216  . DULoxetine (CYMBALTA) DR capsule 60 mg  60 mg Oral Daily Costin Otelia SergeantM Gherghe, MD   60 mg at 02/23/16 1021  . famotidine (PEPCID) tablet 20 mg  20 mg Per Tube BID Leatha Gildingostin M Gherghe, MD   20 mg at 02/23/16 1021  . feeding supplement (OSMOLITE 1.5 CAL) liquid 1,000 mL  1,000 mL Per Tube Q24H Leatha Gildingostin M Gherghe, MD 80 mL/hr at 02/22/16 2200 1,000 mL at  02/22/16 2200  . free water 100 mL  100 mL Per Tube 3 times per day Leatha Gildingostin M Gherghe, MD   100 mL at 02/23/16 0535  . furosemide (LASIX) tablet 20 mg  20 mg Per Tube Daily Costin Otelia SergeantM Gherghe, MD   20 mg at 02/23/16 1021  . heparin injection 5,000 Units  5,000 Units Subcutaneous 3 times per day Leatha Gildingostin M Gherghe, MD   5,000 Units at 02/23/16 0535  . ipratropium-albuterol (DUONEB) 0.5-2.5 (3) MG/3ML nebulizer solution 3 mL  3 mL Nebulization TID Leatha Gildingostin M Gherghe, MD   3 mL at 02/23/16 0856  . levothyroxine (SYNTHROID, LEVOTHROID) tablet 125 mcg  125 mcg Per Tube QAC breakfast Leatha Gildingostin M Gherghe, MD   125 mcg at 02/23/16 0815  . loperamide (IMODIUM) 1 MG/5ML solution 2 mg  2 mg Per Tube PRN Leatha Gildingostin M Gherghe, MD   2 mg at 02/20/16 0022  . metoprolol tartrate (LOPRESSOR) tablet 12.5 mg  12.5 mg Oral BID Leatha Gildingostin M Gherghe, MD   12.5 mg at 02/23/16 1021  . multivitamin with minerals tablet 1 tablet  1 tablet Per Tube Daily Costin Otelia SergeantM Gherghe, MD   1 tablet at 02/23/16 1021  . oxyCODONE (Oxy IR/ROXICODONE) immediate release tablet 5 mg  5 mg Per Tube 6 times per day Cleda MccreedyPaola Gehrig, MD   5 mg at 02/23/16 0815  . polyethylene glycol (MIRALAX / GLYCOLAX) packet 17 g  17 g Per Tube Daily Cleda MccreedyPaola Gehrig, MD   17 g at 02/22/16 0955  . predniSONE (DELTASONE) tablet 20 mg  20 mg Oral BID WC Leatha Gildingostin M Gherghe, MD   20 mg at 02/23/16 0815  . QUEtiapine (SEROQUEL) tablet 50 mg  50 mg Per Tube QPM Costin Otelia SergeantM Gherghe, MD   50 mg at 02/22/16 1743  . sodium chloride flush (NS) 0.9 % injection 3 mL  3 mL Intravenous Q12H Leatha Gildingostin M Gherghe, MD   3 mL at 02/22/16 0955  . valproic acid (DEPAKENE) 250 MG/5ML syrup 500 mg  500 mg Per Tube BID Cleda MccreedyPaola Gehrig, MD   500 mg at 02/23/16 1021     Discharge Medications: Please see discharge summary for a list of discharge medications.  Relevant Imaging Results:  Relevant Lab Results:   Additional Information SSN: [id number]  Scharlene GlossCallie Chrishawn Boley, Student-SW [phone]

## 2016-02-24 DIAGNOSIS — A419 Sepsis, unspecified organism: Principal | ICD-10-CM

## 2016-02-24 DIAGNOSIS — R1310 Dysphagia, unspecified: Secondary | ICD-10-CM

## 2016-02-24 DIAGNOSIS — J189 Pneumonia, unspecified organism: Secondary | ICD-10-CM

## 2016-02-24 LAB — TYPE AND SCREEN
ABO/RH(D): O POS
Antibody Screen: NEGATIVE
Unit division: 0

## 2016-02-24 LAB — BASIC METABOLIC PANEL
Anion gap: 9 (ref 5–15)
BUN: 46 mg/dL — AB (ref 6–20)
CHLORIDE: 95 mmol/L — AB (ref 101–111)
CO2: 31 mmol/L (ref 22–32)
Calcium: 8.2 mg/dL — ABNORMAL LOW (ref 8.9–10.3)
Creatinine, Ser: 0.75 mg/dL (ref 0.44–1.00)
GFR calc Af Amer: >60 mL/min (ref >60)
GFR calc non Af Amer: >60 mL/min (ref >60)
GLUCOSE: 115 mg/dL — AB (ref 65–99)
POTASSIUM: 4.4 mmol/L (ref 3.5–5.1)
Sodium: 135 mmol/L (ref 135–145)

## 2016-02-24 LAB — CULTURE, BLOOD (ROUTINE X 2)
Culture: NO GROWTH
Culture: NO GROWTH

## 2016-02-24 LAB — CBC
HCT: 30.6 % — ABNORMAL LOW (ref 36.0–46.0)
HEMOGLOBIN: 10.2 g/dL — AB (ref 12.0–15.0)
MCH: 32.8 pg (ref 26.0–34.0)
MCHC: 33.3 g/dL (ref 30.0–36.0)
MCV: 98.4 fL (ref 78.0–100.0)
Platelets: 220 10*3/uL (ref 150–400)
RBC: 3.11 MIL/uL — AB (ref 3.87–5.11)
RDW: 20.1 % — ABNORMAL HIGH (ref 11.5–15.5)
WBC: 13.7 10*3/uL — ABNORMAL HIGH (ref 4.0–10.5)

## 2016-02-24 LAB — ABO/RH: ABO/RH(D): O POS

## 2016-02-24 MED ORDER — AMOXICILLIN-POT CLAVULANATE 400-57 MG/5ML PO SUSR
800.0000 mg | Freq: Two times a day (BID) | ORAL | Status: DC
  Administered 2016-02-24 – 2016-02-25 (×3): 800 mg via ORAL
  Filled 2016-02-24 (×4): qty 10

## 2016-02-24 NOTE — Progress Notes (Signed)
Initial Nutrition Assessment  DOCUMENTATION CODES:   Not applicable  INTERVENTION:  - Recommend Osmolite 1.5 @ 80 mL/hr from 1800-0600 with 30 mL Prostat BID and continue 100 mL free water TID. This will provide 1620 kcal, 90 grams of protein, and 1030 mL free water. - RD will continue to monitor for needs  NUTRITION DIAGNOSIS:   Inadequate oral intake related to inability to eat, chronic illness as evidenced by NPO status.  GOAL:   Patient will meet greater than or equal to 90% of their needs  MONITOR:   TF tolerance, Weight trends, Labs, Skin, I & O's  REASON FOR ASSESSMENT:   Other (Comment) (TF)  ASSESSMENT:   82 y.o. female with medical history significant of advanced dementia, hypertension, COPD, hypothyroidism, chronic diastolic heart failure, dysphagia with PEG, was recently admitted to the hospital and discharged yesterday with sepsis thought to be due to healthcare associated pneumonia/possible UTI, discharged back to skilled nursing, he is being brought again to the hospital due to fever of 101 as well as worsening mental status. Patient is nonverbal, lethargic in the ED so I'm unable to obtain any direct history. Per report, patient has had a fever today as well as difficulties breathing and shortness of breath, altered mental status, also requiring 15 L of oxygen. During her previous hospitalization, as per discharge summary, palliative care was consulted and had extensive discussions with the family regarding the patient. She remained full code per POA wishes.   Pt seen for new TF. BMI indicates overweight status. Pt with advanced dementia and unable to provide information. No family/visitors at bedside. Pt with PEG and currently receiving Osmolite 1.5 @ 80 mL/hr over 24 hours with 100 mL free water TID. This regimen is providing 2880 kcal, 120 grams of protein, and 1763 mL free water. This greatly exceeds estimated needs. [facility] at which  time it was recommended that pt receive home regimen of Osmolite 1.5 @ 80 mL/hr from 1800-0600 which provides 1440 kcal, 60 grams protein, and 730 mL free water. Weight at that time was 71 kg and pt has gained 2 kg in the past 7 days. Physical assessment today shows no muscle or fat wasting, mild edema.   TF recommendations outlined above with respect to NPO status and need to meet 100% of estimated needs via TF. Medications reviewed; 20 mg Lasix via PEG/day, daily multivitamin with minerals via PEG. Labs reviewed; Cl: 95 mmol/L, BUN: 46 mg/dL, Ca: 8.2 mg/dL.   Diet Order:  Diet NPO time specified  Skin:  Reviewed, no issues  Last BM:  4/11  Height:   Ht Readings from Last 1 Encounters:  02/19/16 5\' 5"  (1.651 m)    Weight:   Wt Readings from Last 1 Encounters:  02/20/16 161 lb 6 oz (73.2 kg)    Ideal Body Weight:  56.82 kg (kg)  BMI:  Body mass index is 26.85 kg/(m^2).  Estimated Nutritional Needs:   Kcal:  1435-1650  Protein:  80-90 grams  Fluid:  >/= 1.5 L/day  EDUCATION NEEDS:   No education needs identified at this time     Trenton GammonJessica Lenay Lovejoy, RD, LDN Inpatient Clinical Dietitian Pager # [phone] After hours/weekend pager # [phone]

## 2016-02-24 NOTE — Progress Notes (Addendum)
PROGRESS NOTE  [name] ZOX:[id number] DOB: 06/30/1928 DOA: 02/19/2016 PCP: Margit Hanks, MD Outpatient Specialists:    LOS: 5 days   Brief Narrative: 82 y.o. female with medical history significant of advanced dementia, hypertension, COPD, hypothyroidism, chronic diastolic heart failure, dysphagia with PEG, was recently admitted to the hospital and discharged yesterday with sepsis thought to be due to healthcare associated pneumonia/possible UTI, discharged back to skilled nursing, he is being brought again to the hospital due to fever of 101 as well as worsening mental status  Assessment & Plan: Principal Problem:   Sepsis (HCC) Active Problems:   Dementia with behavioral disturbance   COPD (chronic obstructive pulmonary disease) (HCC)   FTT (failure to thrive) in adult   Chronic diastolic heart failure (HCC)   HCAP (healthcare-associated pneumonia)   Sepsis - As evidenced by fever, leukocytosis, tachypnea. Sources likely pulmonary HCAP vs. Aspiration PNA as patient is very high risk for aspiration despite the presence of the PEG tube - Sepsis physiology improving  - Lactic acid is improving - Treated with IV vancomycin and cefepime, will transition today to by mouth Augmentin.  Acute encephalopathy - Likely in the setting of #1 - Not great mental status at baseline to begin with - She is improving, answering questions however very confused which is likely her baseline  Failure to thrive/chronic dysphagia - Patient is status post PEG tube placement, per previous hospitalization after extensive discussion with the family and patient was also allowed a dysphagia 1 diet for comfort, and risks have been discussed extensively with family members in the past. Suspect this plays a big role in her recurrent aspiration. - Continue NPO, if she is to be full scope of treatment as per POA, absolutely nothing to eat by mouth otherwise this is bound to happen again.   NSVT - on  4/9, obtained 2D echo, normal EF, keep K>4 Mg>2 - added low dose Metoprolol, continue - no further episodes  Acute on chronic diastolic heart failure - Monitored closely while receiving IV fluids for #1, she has chronic lower extremity edema, chest x-ray without significant fluid overload, no crackles on exam - resume Lasix today, off of IVF  Goals of care - Dr Stevphen Meuse Discussed with son over the phone (POA), he wants to continue full scope of treatment - Extensive discussions was carried during her last hospitalization just a few days ago, palliative care was consulted at that time. We'll hold reconsulting palliative at this point since patient is very clear about patient's wishes for now. Will reconsider if she is to deteriorate, very high risk for that  Dementia with behavioral disturbances - Continue her home medications,   COPD, likely with exacerbation - Wheezing has resolved, continue medications as above  Bipolar disorder - Continue home medication   DVT prophylaxis: heparin Code Status: Full code Family Communication: none at bedside Disposition Plan: SNF when ready Barriers for discharge: sepsis   Consultants:   None   Procedures:   None   Antimicrobials:  Vancomycin 4/7 >> 4/9  Cefepime 4/7 >> 4/12  Subjective: - alert, answering questions, denies any pain, oriented to person only  Objective: Filed Vitals:   02/24/16 0418 02/24/16 0900 02/24/16 1104 02/24/16 1108  BP: 137/79 119/71    Pulse: 68 68    Temp: 98.2 F (36.8 C) 97.7 F (36.5 C)    TempSrc: Oral Axillary    Resp: 20 19    Height:      Weight:  SpO2: 93% 94% 95% 95%    Intake/Output Summary (Last 24 hours) at 02/24/16 1250 Last data filed at 02/24/16 0836  Gross per 24 hour  Intake    786 ml  Output   2640 ml  Net  -1854 ml   Filed Weights   02/19/16 1930 02/20/16 0356  Weight: 70.9 kg (156 lb 4.9 oz) 73.2 kg (161 lb 6 oz)    Examination: Constitutional: NAD,  alert Filed Vitals:   02/24/16 0418 02/24/16 0900 02/24/16 1104 02/24/16 1108  BP: 137/79 119/71    Pulse: 68 68    Temp: 98.2 F (36.8 C) 97.7 F (36.5 C)    TempSrc: Oral Axillary    Resp: 20 19    Height:      Weight:      SpO2: 93% 94% 95% 95%   Eyes: PERRL, lids and conjunctivae normal ENMT: Mucous membranes are moist.  Neck: normal, supple Respiratory: Coarse breath sounds bilaterally, no wheezing, no crackles.   Cardiovascular: Regular rate and rhythm, no murmurs / rubs / gallops. Trace extremity edema. 2+ pedal pulses. Abdomen: no tenderness, no masses palpated. Musculoskeletal: no clubbing / cyanosis.  Neurologic: alert, intermittently following commands, exam appears nonfocal   Data Reviewed: I have personally reviewed following labs and imaging studies  CBC:  Recent Labs Lab 02/19/16 1539 02/20/16 0159 02/21/16 0048 02/22/16 0340 02/23/16 0533 02/24/16 0516  WBC 16.3* 15.9* 16.2* 17.2* 13.9* 13.7*  NEUTROABS 13.3*  --   --   --   --   --   HGB 10.2* 8.1* 7.6* 7.5* 7.4* 10.2*  HCT 31.6* 25.4* 23.5* 23.4* 22.7* 30.6*  MCV 107.5* 110.4* 108.3* 108.8* 106.6* 98.4  PLT 261 176 138* 174 204 220   Basic Metabolic Panel:  Recent Labs Lab 02/20/16 0159 02/21/16 0048 02/22/16 0340 02/23/16 0533 02/24/16 0516  NA 142 143 141 140 135  K 4.3 4.7 4.9 4.9 4.4  CL 113* 113* 106 100* 95*  CO2 16* 18* 25 30 31   GLUCOSE 155* 152* 135* 114* 115*  BUN 29* 29* 34* 44* 46*  CREATININE 0.69 0.64 0.70 0.71 0.75  CALCIUM 7.8* 8.3* 8.3* 8.4* 8.2*  MG  --  2.1  --   --   --    GFR: Estimated Creatinine Clearance: 51.5 mL/min (by C-G formula based on Cr of 0.75). Liver Function Tests:  Recent Labs Lab 02/19/16 1539 02/20/16 0159 02/21/16 0048 02/22/16 0340 02/23/16 0533  AST 49* 39 48* 42* 21  ALT 34 32 55* 43 29  ALKPHOS 77 64 64 65 56  BILITOT 0.3 0.4 0.3 0.7 0.5  PROT 6.2* 5.2* 5.4* 5.4* 5.1*  ALBUMIN 2.7* 2.2* 2.3* 2.3* 2.3*   No results for input(s):  LIPASE, AMYLASE in the last 168 hours. No results for input(s): AMMONIA in the last 168 hours. Coagulation Profile:  Recent Labs Lab 02/19/16 1539  INR 0.98   Cardiac Enzymes: No results for input(s): CKTOTAL, CKMB, CKMBINDEX, TROPONINI in the last 168 hours. BNP (last 3 results) No results for input(s): PROBNP in the last 8760 hours. HbA1C: No results for input(s): HGBA1C in the last 72 hours. CBG:  Recent Labs Lab 02/17/16 1638 02/17/16 2131 02/18/16 0131 02/18/16 0437 02/18/16 0818  GLUCAP 120* 120* 119* 123* 98   Lipid Profile: No results for input(s): CHOL, HDL, LDLCALC, TRIG, CHOLHDL, LDLDIRECT in the last 72 hours. Thyroid Function Tests: No results for input(s): TSH, T4TOTAL, FREET4, T3FREE, THYROIDAB in the last 72 hours. Anemia Panel: No  results for input(s): VITAMINB12, FOLATE, FERRITIN, TIBC, IRON, RETICCTPCT in the last 72 hours. Urine analysis:    Component Value Date/Time   COLORURINE YELLOW 02/19/2016 1533   APPEARANCEUR HAZY* 02/19/2016 1533   LABSPEC 1.027 02/19/2016 1533   PHURINE 6.0 02/19/2016 1533   GLUCOSEU NEGATIVE 02/19/2016 1533   HGBUR TRACE* 02/19/2016 1533   BILIRUBINUR NEGATIVE 02/19/2016 1533   KETONESUR NEGATIVE 02/19/2016 1533   PROTEINUR 30* 02/19/2016 1533   UROBILINOGEN 1.0 09/22/2015 1900   NITRITE NEGATIVE 02/19/2016 1533   LEUKOCYTESUR SMALL* 02/19/2016 1533   Sepsis Labs: Invalid input(s): PROCALCITONIN, LACTICIDVEN  Recent Results (from the past 240 hour(s))  Culture, blood (Routine X 2) w Reflex to ID Panel     Status: None   Collection Time: 02/14/16  5:12 PM  Result Value Ref Range Status   Specimen Description BLOOD LEFT HAND  Final   Special Requests BOTTLES DRAWN AEROBIC ONLY 5CC  Final   Culture NO GROWTH 5 DAYS  Final   Report Status 02/19/2016 FINAL  Final  Culture, blood (Routine X 2) w Reflex to ID Panel     Status: None   Collection Time: 02/14/16  5:13 PM  Result Value Ref Range Status   Specimen  Description BLOOD LEFT ANTECUBITAL  Final   Special Requests BOTTLES DRAWN AEROBIC ONLY 5CC  Final   Culture NO GROWTH 5 DAYS  Final   Report Status 02/19/2016 FINAL  Final  Urine culture     Status: None   Collection Time: 02/19/16  3:33 PM  Result Value Ref Range Status   Specimen Description URINE, SUPRAPUBIC  Final   Special Requests NONE  Final   Culture   Final    NO GROWTH 2 DAYS Performed at [facility]    Report Status 02/21/2016 FINAL  Final  Blood Culture (routine x 2)     Status: None (Preliminary result)   Collection Time: 02/19/16  3:37 PM  Result Value Ref Range Status   Specimen Description BLOOD LEFT FOREARM  Final   Special Requests IN PEDIATRIC BOTTLE 3CC  Final   Culture   Final    NO GROWTH 4 DAYS Performed at [facility]    Report Status PENDING  Incomplete  Blood Culture (routine x 2)     Status: None (Preliminary result)   Collection Time: 02/19/16  5:14 PM  Result Value Ref Range Status   Specimen Description BLOOD RIGHT FOOT  Final   Special Requests BOTTLES DRAWN AEROBIC AND ANAEROBIC 5CC  Final   Culture   Final    NO GROWTH 4 DAYS Performed at [facility]    Report Status PENDING  Incomplete  MRSA PCR Screening     Status: None   Collection Time: 02/19/16  7:59 PM  Result Value Ref Range Status   MRSA by PCR NEGATIVE NEGATIVE Final    Comment:        The GeneXpert MRSA Assay (FDA approved for NASAL specimens only), is one component of a comprehensive MRSA colonization surveillance program. It is not intended to diagnose MRSA infection nor to guide or monitor treatment for MRSA infections.       Radiology Studies: No results found.   Scheduled Meds: . amoxicillin-clavulanate  800 mg Oral Q12H  . antiseptic oral rinse  7 mL Mouth Rinse BID  . budesonide (PULMICORT) nebulizer solution  0.25 mg Nebulization BID  . memantine  28 mg Oral QHS   And  . donepezil  10  mg Oral QHS  . DULoxetine  60 mg Oral Daily    . famotidine  20 mg Per Tube BID  . feeding supplement (OSMOLITE 1.5 CAL)  1,000 mL Per Tube Q24H  . free water  100 mL Per Tube 3 times per day  . furosemide  20 mg Per Tube Daily  . heparin  5,000 Units Subcutaneous 3 times per day  . ipratropium-albuterol  3 mL Nebulization TID  . levothyroxine  125 mcg Per Tube QAC breakfast  . metoprolol tartrate  12.5 mg Oral BID  . multivitamin with minerals  1 tablet Per Tube Daily  . polyethylene glycol  17 g Per Tube Daily  . predniSONE  20 mg Oral BID WC  . QUEtiapine  50 mg Per Tube QPM  . sodium chloride flush  3 mL Intravenous Q12H  . valproic acid  500 mg Per Tube BID   Continuous Infusions:   Time spent : 25 minutes  Huey Bienenstockawood Mirl Hillery MD [facility] Pager [phone]  If 7PM-7AM, please contact night-coverage [email or web address] Password [email or web address] 02/24/2016, 12:50 PM

## 2016-02-25 DIAGNOSIS — I5032 Chronic diastolic (congestive) heart failure: Secondary | ICD-10-CM

## 2016-02-25 MED ORDER — METOPROLOL TARTRATE 25 MG PO TABS: 12.5000 mg | ORAL_TABLET | Freq: Two times a day (BID) | ORAL | Status: AC

## 2016-02-25 MED ORDER — VALPROATE SODIUM 250 MG/5ML PO SYRP: 500.0000 mg | ORAL_SOLUTION | Freq: Two times a day (BID) | ORAL | Status: AC

## 2016-02-25 MED ORDER — OXYCODONE HCL 5 MG PO TABS: 5.0000 mg | ORAL_TABLET | Freq: Four times a day (QID) | ORAL | Status: AC | PRN

## 2016-02-25 MED ORDER — OXYCODONE HCL 5 MG PO TABS: 5.0000 mg | ORAL_TABLET | ORAL | Status: DC

## 2016-02-25 MED ORDER — AMOXICILLIN-POT CLAVULANATE 500-125 MG PO TABS: 1.0000 | ORAL_TABLET | Freq: Two times a day (BID) | ORAL | Status: AC

## 2016-02-25 MED ORDER — PREDNISONE 10 MG PO TABS: ORAL_TABLET | ORAL | Status: AC

## 2016-02-25 NOTE — Discharge Summary (Addendum)
[name], is a 82 y.o. female  DOB 06/06/1928  MRN [id number].  Admission date:  02/19/2016  Admitting Physician  Cleda MccreedyPaola Gehrig, MD  Discharge Date:  02/25/2016   Primary MD  Margit HanksALEXANDER, ANNE D, MD  Recommendations for primary care physician for things to follow:  - Patient  strict nothing by mouth, only feeding via PEG as she is high risk for aspiration - Repeat 2 view chest x-ray in 2 weeks - Foley catheter discontinued, patient voiding with no problem   Admission Diagnosis  HCAP (healthcare-associated pneumonia) [J18.9]   Discharge Diagnosis  HCAP (healthcare-associated pneumonia) [J18.9]    Principal Problem:   Sepsis (HCC) Active Problems:   Dementia with behavioral disturbance   COPD (chronic obstructive pulmonary disease) (HCC)   FTT (failure to thrive) in adult   Chronic diastolic heart failure (HCC)   HCAP (healthcare-associated pneumonia)      Past Medical History  Diagnosis Date  . COPD (chronic obstructive pulmonary disease) (HCC)   . Hypertension   . Blind   . Hypothyroid   . Pacemaker   . Renal insufficiency   . Finger fracture   . Dementia   . Bipolar disorder (HCC)   . Dysphagia     needs thick nectar liquids  . Atrial fibrillation (HCC)   . Pneumonia   . Anxiety   . Diastolic heart failure [facility](HCC)     Past Surgical History  Procedure Laterality Date  . Insert / replace / remove pacemaker    . Tracheostomy  02/27/13    decannulated 03/29/13  . Gastrostomy tube placement  03/11/13       History of present illness and  Hospital Course:     Kindly see H&P for history of present illness and admission details, please review complete Labs, Consult reports and Test reports for all details in brief  HPI  from the history and physical done on the day of admission 02/19/2016  HPI: [name] is a 82 y.o. female with medical history significant of advanced dementia,  hypertension, COPD, hypothyroidism, chronic diastolic heart failure, dysphagia with PEG, was recently admitted to the hospital and discharged yesterday with sepsis thought to be due to healthcare associated pneumonia/possible UTI, discharged back to skilled nursing, he is being brought again to the hospital due to fever of 101 as well as worsening mental status. Patient is nonverbal, lethargic in the ED so I'm unable to obtain any direct history. Per report, patient has had a fever today as well as difficulties breathing and shortness of breath, altered mental status, also requiring 15 L of oxygen. During her previous hospitalization, as per discharge summary, palliative care was consulted and had extensive discussions with the family regarding the patient. She remained full code per POA wishes. Dr. Freida BusmanAllen from ED discussed extensively with patient's son (POA), and she is to remain full code for now.  ED Course: Patient's vital signs are stable on admission, she is normotensive, has low-grade temperature of 99.8, she is to And heart rate is 96.  She is satting 100% on a Venturi mask. Her blood work is significant for mild hypernatremia with a sodium 147, she has normal renal function, she has leukocytosis with a white count of 16 and an elevated lactic acid 3.8. Chest x-ray without acute findings  TRH was asked for admission to stepdown for sepsis from possible pulmonary source.  Hospital Course  82 y.o. female with medical history significant of advanced dementia, hypertension, COPD, hypothyroidism, chronic diastolic heart failure, dysphagia with PEG, was recently admitted to the hospital and discharged yesterday with sepsis thought to be due to healthcare associated pneumonia/possible UTI, discharged back to skilled nursing, he is being brought again to the hospital due to fever of 101 as well as worsening mental status  Sepsis - As evidenced by fever, leukocytosis, tachypnea. Sources likely pulmonary  HCAP vs. Aspiration PNA as patient is very high risk for aspiration despite the presence of the PEG tube - Sepsis physiology improving - Lactic acid is improving - Treated with IV vancomycin and cefepime during hospital stay, IV vancomycin stopped on 4/9, Cefadyl stopped on 4/12, transitioned to by mouth Augmentin to finish out in 2 days as an outpatient - Will need 2 view chest x-ray in 2 weeks - Continue with the steroid taper on discharge  Acute encephalopathy - Likely in the setting of #1 - Significantly improved, back to baseline, communicative, but confused  Failure to thrive/chronic dysphagia - Patient is status post PEG tube placement, per previous hospitalization after extensive discussion with the family and patient was also allowed a dysphagia 1 diet for comfort, and risks have been discussed extensively with family members in the past. Suspect this plays a big role in her recurrent aspiration. - Continue NPO, if she is to be full scope of treatment as per POA, absolutely nothing to eat by mouth otherwise this is bound to happen again.   NSVT - on 4/9, obtained 2D echo, normal EF, keep K>4 Mg>2 - added low dose Metoprolol, continue - no further episodes  Acute on chronic diastolic heart failure - Monitored closely while receiving IV fluids for #1, she has chronic lower extremity edema, chest x-ray without significant fluid overload, no crackles on exam - Sputum to home dose Lasix on discharge  Goals of care - Dr Stevphen Meuse Discussed with son over the phone (POA), he wants to continue full scope of treatment - Extensive discussions was carried during her last hospitalization just a few days ago, palliative care was consulted at that time.   Dementia with behavioral disturbances - Continue her home medications,   COPD, likely with exacerbation - Wheezing has resolved, continue medications as above  Bipolar disorder - Continue home medication     Discharge Condition:    Stable   Follow UP   with SNF physician   Discharge Instructions  and  Discharge Medications        Discharge Instructions    Discharge instructions    Complete by:  As directed   Follow with Primary MD Margit Hanks, MD in facility  Get CBC, CMP, 2 view Chest X ray checked  by Primary MD next visit.    Activity: As tolerated with Full fall precautions use walker/cane & assistance as needed   Disposition SNF   Diet: Strict nothing by mouth as high risk for aspiration, only feeding via PEG tube  For Heart failure patients - Check your Weight same time everyday, if you gain over 2 pounds, or you develop in leg swelling, experience  more shortness of breath or chest pain, call your Primary MD immediately. Follow Cardiac Low Salt Diet and 1.5 lit/day fluid restriction.   On your next visit with your primary care physician please Get Medicines reviewed and adjusted.   Please request your Prim.MD to go over all Hospital Tests and Procedure/Radiological results at the follow up, please get all Hospital records sent to your Prim MD by signing hospital release before you go home.   If you experience worsening of your admission symptoms, develop shortness of breath, life threatening emergency, suicidal or homicidal thoughts you must seek medical attention immediately by calling 911 or calling your MD immediately  if symptoms less severe.  You Must read complete instructions/literature along with all the possible adverse reactions/side effects for all the Medicines you take and that have been prescribed to you. Take any new Medicines after you have completely understood and accpet all the possible adverse reactions/side effects.   Do not drive, operating heavy machinery, perform activities at heights, swimming or participation in water activities or provide baby sitting services if your were admitted for syncope or siezures until you have seen by Primary MD or a Neurologist and  advised to do so again.  Do not drive when taking Pain medications.    Do not take more than prescribed Pain, Sleep and Anxiety Medications  Special Instructions: If you have smoked or chewed Tobacco  in the last 2 yrs please stop smoking, stop any regular Alcohol  and or any Recreational drug use.  Wear Seat belts while driving.   Please note  You were cared for by a hospitalist during your hospital stay. If you have any questions about your discharge medications or the care you received while you were in the hospital after you are discharged, you can call the unit and asked to speak with the hospitalist on call if the hospitalist that took care of you is not available. Once you are discharged, your primary care physician will handle any further medical issues. Please note that NO REFILLS for any discharge medications will be authorized once you are discharged, as it is imperative that you return to your primary care physician (or establish a relationship with a primary care physician if you do not have one) for your aftercare needs so that they can reassess your need for medications and monitor your lab values.            Medication List    STOP taking these medications        divalproex 500 MG DR tablet  Commonly known as:  DEPAKOTE      TAKE these medications        amoxicillin-clavulanate 500-125 MG tablet  Commonly known as:  AUGMENTIN  Place 1 tablet (500 mg total) into feeding tube 2 (two) times daily. Stick for 2 more days, stop on 4/15     beta carotene w/minerals tablet  Place 1 tablet into feeding tube daily.     docusate sodium 100 MG capsule  Commonly known as:  COLACE  Take 100 mg by mouth daily.     DULoxetine 60 MG capsule  Commonly known as:  CYMBALTA  Take 60 mg by mouth daily.     famotidine 20 MG tablet  Commonly known as:  PEPCID  Place 1 tablet (20 mg total) into feeding tube 2 (two) times daily.     feeding supplement (OSMOLITE 1.5 CAL) Liqd    Place 1,000 mLs into feeding tube daily. Per Tube,  at 80 mL/hr, Every 24 hours     free water Soln  Place 100 mLs into feeding tube every 8 (eight) hours.     furosemide 20 MG tablet  Commonly known as:  LASIX  Place 1 tablet (20 mg total) into feeding tube daily.     ipratropium-albuterol 0.5-2.5 (3) MG/3ML Soln  Commonly known as:  DUONEB  Take 3 mLs by nebulization 4 (four) times daily.     ipratropium-albuterol 0.5-2.5 (3) MG/3ML Soln  Commonly known as:  DUONEB  Take 3 mLs by nebulization every 3 (three) hours as needed.     levothyroxine 125 MCG tablet  Commonly known as:  SYNTHROID, LEVOTHROID  Place 1 tablet (125 mcg total) into feeding tube daily before breakfast. Reported on 02/12/2016     lidocaine 5 %  Commonly known as:  LIDODERM  Place 1 patch onto the skin 2 (two) times daily. Apply 1/2 patch to each shoulder and to each knee for 12 hours, then remove     loperamide 1 MG/5ML solution  Commonly known as:  IMODIUM  Place 10 mLs (2 mg total) into feeding tube as needed for diarrhea or loose stools.     Memantine HCl-Donepezil HCl 28-10 MG Cp24  Commonly known as:  NAMZARIC  1 capsule by PEG Tube route daily.     metoprolol tartrate 25 MG tablet  Commonly known as:  LOPRESSOR  Place 0.5 tablets (12.5 mg total) into feeding tube 2 (two) times daily.     multivitamin with minerals Tabs tablet  Place 1 tablet into feeding tube daily.     oxyCODONE 5 MG immediate release tablet  Commonly known as:  Oxy IR/ROXICODONE  Place 1 tablet (5 mg total) into feeding tube every 6 (six) hours as needed for severe pain.     OXYGEN  Inhale 2 Imperial Gallon into the lungs continuous. Reported on 02/12/2016     polyethylene glycol packet  Commonly known as:  MIRALAX / GLYCOLAX  Place 17 g into feeding tube daily.     potassium chloride 20 MEQ/15ML (10%) Soln  Place 15 mLs (20 mEq total) into feeding tube daily.     predniSONE 10 MG tablet  Commonly known as:  DELTASONE   Please take 30 mg  via PEG daily for 3 days, then 20 mg via PEG daily for 3 days, then 10 mg via PEG daily for 3 days then stop     QUEtiapine 50 MG tablet  Commonly known as:  SEROQUEL  Place 1 tablet (50 mg total) into feeding tube every evening.     SYSTANE BALANCE 0.6 % Soln  Generic drug:  Propylene Glycol  Place 1 drop into both eyes 2 (two) times daily.     valproic acid 250 MG/5ML syrup  Commonly known as:  DEPAKENE  Place 10 mLs (500 mg total) into feeding tube 2 (two) times daily.          Diet and Activity recommendation: See Discharge Instructions above   Consults obtained -  None   Major procedures and Radiology Reports - PLEASE review detailed and final reports for all details, in brief -      Dg Chest Port 1 View  02/19/2016  CLINICAL DATA:  82 year old female respiratory distress and diaphoresis. Initial encounter. EXAM: PORTABLE CHEST 1 VIEW COMPARISON:  02/16/2016 and earlier. FINDINGS: Portable AP semi upright view at at 1519 hours. Patient remains rotated to the right, limiting visualization of the right lung. Stable cardiomegaly and  mediastinal contours. Left chest cardiac pacemaker appear stable. Stable ventilation. No pneumothorax, acute pulmonary edema, or pleural effusion identified. Gas containing gastric hiatal hernia appears mildly increased. IMPRESSION: Rotated to the right. Stable ventilation since 02/16/2016. Increased size of gas containing gastric hiatal hernia. Electronically Signed   By: Odessa Fleming M.D.   On: 02/19/2016 15:39   Dg Chest Port 1 View  02/16/2016  CLINICAL DATA:  Shortness of breath.  Central line placement. EXAM: PORTABLE CHEST - 1 VIEW COMPARISON:  One-view chest x-ray 02/15/2016 FINDINGS: The patient is rotated right. The heart size is magnified by low lung volumes. A right IJ line terminates in the mid SVC. A right pleural effusion is present. Bibasilar airspace disease is noted. Remote right-sided rib fractures are present. Left  subclavian pacemaker lines are stable. IMPRESSION: 1. A right IJ line terminates in the mid SVC. 2. Bilateral pleural effusions and associated airspace disease likely reflect atelectasis. These are unchanged. Electronically Signed   By: Marin Roberts M.D.   On: 02/16/2016 11:22   Dg Chest Port 1 View  02/15/2016  CLINICAL DATA:  Pneumonia. EXAM: PORTABLE CHEST 1 VIEW COMPARISON:  02/13/2016. FINDINGS: Patient is rotated to the right. Cardiac pacer noted with lead tips in stable position. Heart size normal. Left lower lobe subsegmental atelectasis and or infiltrate. Tiny left pleural effusion cannot be excluded. No pneumothorax . IMPRESSION: 1. Mild left lower lobe subsegmental atelectasis and or infiltrate. Small left pleural effusion. No interim change. 2.  Cardiac pacer in stable position.  Heart size normal. Electronically Signed   By: Maisie Fus  Register   On: 02/15/2016 07:30   Dg Chest Port 1 View  02/13/2016  CLINICAL DATA:  Sepsis. EXAM: PORTABLE CHEST 1 VIEW COMPARISON:  February 12, 2016. FINDINGS: Stable cardiomegaly. Left-sided pacemaker is unchanged in position. Right internal jugular catheter is unchanged with distal tip overlying expected position of the SVC. No pneumothorax is noted. Stable mild left basilar opacity is noted consistent with subsegmental atelectasis. Right lung is clear. Probable old distal right clavicular fracture is noted. IMPRESSION: Stable left basilar opacity is noted most consistent with subsegmental atelectasis. Electronically Signed   By: Lupita Raider, M.D.   On: 02/13/2016 07:36   Dg Chest Portable 1 View  02/12/2016  CLINICAL DATA:  82 year old female with central line placement. EXAM: PORTABLE CHEST 1 VIEW COMPARISON:  02/12/2016 and prior radiographs FINDINGS: The patient is rotated. Right IJ central venous catheter is noted with tip overlying the upper-mid SVC. There is no evidence of pneumothorax. Mild bibasilar atelectasis is present. Cardiomegaly left-sided  pacemaker again identified. IMPRESSION: Right IJ central venous catheter with tip overlying the upper-mid SVC. No evidence of pneumothorax. Electronically Signed   By: Harmon Pier M.D.   On: 02/12/2016 20:21   Dg Chest Portable 1 View  02/12/2016  CLINICAL DATA:  Sepsis.  Shortness of breath. EXAM: PORTABLE CHEST 1 VIEW COMPARISON:  06/29/2014. FINDINGS: Patient rotated. This makes evaluation difficult. Mild right base atelectasis and/or infiltrate cannot be excluded. No pleural effusion or pneumothorax. Heart size normal. Cardiac pacer in stable position. Old healed right rib fractures. IMPRESSION: Patient is rotated making evaluation difficult. Mild right base atelectasis and or infiltrate cannot be excluded. Follow-up chest x-ray with better alignment should be considered. Electronically Signed   By: Maisie Fus  Register   On: 02/12/2016 15:54    Micro Results    Recent Results (from the past 240 hour(s))  Urine culture     Status: None  Collection Time: 02/19/16  3:33 PM  Result Value Ref Range Status   Specimen Description URINE, SUPRAPUBIC  Final   Special Requests NONE  Final   Culture   Final    NO GROWTH 2 DAYS Performed at [facility]    Report Status 02/21/2016 FINAL  Final  Blood Culture (routine x 2)     Status: None   Collection Time: 02/19/16  3:37 PM  Result Value Ref Range Status   Specimen Description BLOOD LEFT FOREARM  Final   Special Requests IN PEDIATRIC BOTTLE 3CC  Final   Culture   Final    NO GROWTH 5 DAYS Performed at [facility]    Report Status 02/24/2016 FINAL  Final  Blood Culture (routine x 2)     Status: None   Collection Time: 02/19/16  5:14 PM  Result Value Ref Range Status   Specimen Description BLOOD RIGHT FOOT  Final   Special Requests BOTTLES DRAWN AEROBIC AND ANAEROBIC 5CC  Final   Culture   Final    NO GROWTH 5 DAYS Performed at [facility]    Report Status 02/24/2016 FINAL  Final  MRSA PCR Screening     Status: None    Collection Time: 02/19/16  7:59 PM  Result Value Ref Range Status   MRSA by PCR NEGATIVE NEGATIVE Final    Comment:        The GeneXpert MRSA Assay (FDA approved for NASAL specimens only), is one component of a comprehensive MRSA colonization surveillance program. It is not intended to diagnose MRSA infection nor to guide or monitor treatment for MRSA infections.        Today   Subjective:   [name] today - alert, answering questions, denies any pain, has no complaints  Objective:   Blood pressure 120/78, pulse 72, temperature 97.9 F (36.6 C), temperature source Axillary, resp. rate 17, height 5\' 5"  (1.651 m), weight 73.2 kg (161 lb 6 oz), SpO2 93 %.   Intake/Output Summary (Last 24 hours) at 02/25/16 1249 Last data filed at 02/25/16 0700  Gross per 24 hour  Intake   4720 ml  Output   1850 ml  Net   2870 ml    Exam Eyes: PERRL, lids and conjunctivae normal ENMT: Mucous membranes are moist.  Neck: normal, supple Respiratory: Coarse breath sounds bilaterally, no wheezing, no crackles.  Cardiovascular: Regular rate and rhythm, no murmurs / rubs / gallops. Trace extremity edema. 2+ pedal pulses. Abdomen: no tenderness, no masses palpated, PEG +,  Musculoskeletal: no clubbing / cyanosis.  Neurologic: alert, intermittently following commands, exam appears nonfocal  Data Review   CBC w Diff:  Lab Results  Component Value Date   WBC 13.7* 02/24/2016   WBC 6.2 12/30/2015   HGB 10.2* 02/24/2016   HCT 30.6* 02/24/2016   PLT 220 02/24/2016   LYMPHOPCT 11 02/19/2016   MONOPCT 4 02/19/2016   EOSPCT 3 02/19/2016   BASOPCT 0 02/19/2016    CMP:  Lab Results  Component Value Date   NA 135 02/24/2016   NA 146 12/30/2015   K 4.4 02/24/2016   CL 95* 02/24/2016   CO2 31 02/24/2016   BUN 46* 02/24/2016   BUN 44* 12/30/2015   CREATININE 0.75 02/24/2016   CREATININE 0.9 12/30/2015   GLU 107 12/30/2015   PROT 5.1* 02/23/2016   ALBUMIN 2.3*  02/23/2016   BILITOT 0.5 02/23/2016   ALKPHOS 56 02/23/2016   AST 21 02/23/2016   ALT  29 02/23/2016  .   Total Time in preparing paper work, data evaluation and todays exam - 35 minutes  Tasean Mancha M.D on 02/25/2016 at 12:49 PM  [facility]   Office  [phone]

## 2016-02-25 NOTE — Progress Notes (Signed)
Discharge instructions accompanied pt, left the unit in stable condition. Transported via [facility] to SNF.

## 2016-02-25 NOTE — Clinical Social Work Note (Signed)
Per MD patient ready to DC back to [facility]. RN, patient/family (Son [name]), and facility notified of patient's DC. RN given number for report. DC packet on patient's chart. Unit secretary will call the son once EMS has picked up the patient. Ambulance transport requested for patient. CSW signing off at this time.  Roddie McBryant Uchenna Rappaport MSW, CylinderLCSW, Sau[place]ASA, [phone]

## 2016-02-25 NOTE — Discharge Instructions (Signed)
Follow with Primary MD Margit HanksALEXANDER, ANNE D, MD in facility  Get CBC, CMP, 2 view Chest X ray checked  by Primary MD next visit.    Activity: As tolerated with Full fall precautions use walker/cane & assistance as needed   Disposition SNF   Diet: Strict nothing by mouth as high risk for aspiration, only feeding via PEG tube  For Heart failure patients - Check your Weight same time everyday, if you gain over 2 pounds, or you develop in leg swelling, experience more shortness of breath or chest pain, call your Primary MD immediately. Follow Cardiac Low Salt Diet and 1.5 lit/day fluid restriction.   On your next visit with your primary care physician please Get Medicines reviewed and adjusted.   Please request your Prim.MD to go over all Hospital Tests and Procedure/Radiological results at the follow up, please get all Hospital records sent to your Prim MD by signing hospital release before you go home.   If you experience worsening of your admission symptoms, develop shortness of breath, life threatening emergency, suicidal or homicidal thoughts you must seek medical attention immediately by calling 911 or calling your MD immediately  if symptoms less severe.  You Must read complete instructions/literature along with all the possible adverse reactions/side effects for all the Medicines you take and that have been prescribed to you. Take any new Medicines after you have completely understood and accpet all the possible adverse reactions/side effects.   Do not drive, operating heavy machinery, perform activities at heights, swimming or participation in water activities or provide baby sitting services if your were admitted for syncope or siezures until you have seen by Primary MD or a Neurologist and advised to do so again.  Do not drive when taking Pain medications.    Do not take more than prescribed Pain, Sleep and Anxiety Medications  Special Instructions: If you have smoked or chewed  Tobacco  in the last 2 yrs please stop smoking, stop any regular Alcohol  and or any Recreational drug use.  Wear Seat belts while driving.   Please note  You were cared for by a hospitalist during your hospital stay. If you have any questions about your discharge medications or the care you received while you were in the hospital after you are discharged, you can call the unit and asked to speak with the hospitalist on call if the hospitalist that took care of you is not available. Once you are discharged, your primary care physician will handle any further medical issues. Please note that NO REFILLS for any discharge medications will be authorized once you are discharged, as it is imperative that you return to your primary care physician (or establish a relationship with a primary care physician if you do not have one) for your aftercare needs so that they can reassess your need for medications and monitor your lab values.

## 2016-02-29 ENCOUNTER — Non-Acute Institutional Stay (SKILLED_NURSING_FACILITY): Payer: Medicare Other | Admitting: Internal Medicine

## 2016-02-29 DIAGNOSIS — J449 Chronic obstructive pulmonary disease, unspecified: Secondary | ICD-10-CM | POA: Diagnosis not present

## 2016-02-29 DIAGNOSIS — F3175 Bipolar disorder, in partial remission, most recent episode depressed: Secondary | ICD-10-CM | POA: Diagnosis not present

## 2016-02-29 DIAGNOSIS — R627 Adult failure to thrive: Secondary | ICD-10-CM | POA: Diagnosis not present

## 2016-02-29 DIAGNOSIS — I5033 Acute on chronic diastolic (congestive) heart failure: Secondary | ICD-10-CM | POA: Diagnosis not present

## 2016-02-29 DIAGNOSIS — I4729 Other ventricular tachycardia: Secondary | ICD-10-CM

## 2016-02-29 DIAGNOSIS — G9340 Encephalopathy, unspecified: Secondary | ICD-10-CM | POA: Diagnosis not present

## 2016-02-29 DIAGNOSIS — J9601 Acute respiratory failure with hypoxia: Secondary | ICD-10-CM | POA: Diagnosis not present

## 2016-02-29 DIAGNOSIS — I472 Ventricular tachycardia: Secondary | ICD-10-CM | POA: Diagnosis not present

## 2016-02-29 DIAGNOSIS — F03918 Unspecified dementia, unspecified severity, with other behavioral disturbance: Secondary | ICD-10-CM

## 2016-02-29 DIAGNOSIS — A419 Sepsis, unspecified organism: Secondary | ICD-10-CM | POA: Diagnosis not present

## 2016-02-29 DIAGNOSIS — F0391 Unspecified dementia with behavioral disturbance: Secondary | ICD-10-CM

## 2016-02-29 NOTE — Progress Notes (Signed)
MRN: [id number] Name: [name]  Sex: female Age: 82 y.o. DOB: 07-Sep-1928  PSC #: Ronni Rumble Facility/Room:218 Level Of Care: SNF Provider: Merrilee Seashore MD Emergency Contacts: Extended Emergency Contact Information Primary Emergency Contact: Tsou,[name] Address: [address]           [place], [place] [address] [place] Home Phone: [phone] Relation: Son Secondary Emergency Contact: Monaghan,Hope Address: [address] [place] [address] [place] Home Phone: [phone] Relation: Daughter  Code Status:   Allergies: Review of patient's allergies indicates no known allergies.  Chief Complaint  Patient presents with  . Readmit To SNF    HPI: Patient is 82 y.o. female whoadvanced dementia, hypertension, COPD, hypothyroidism, chronic diastolic heart failure, dysphagia with PEG, was recently admitted to the hospital and discharged yesterday with sepsis thought to be due to healthcare associated pneumonia/possible UTI, discharged back to skilled nursing, he is being brought again to the hospital due to fever of 101 as well as worsening mental status. Pt was admitted to [facility] from 4/7-13 where she was treated for sepsis 2/2 to aspiration PNA. Pt had just been d/c from hosp 4/6 for sepsis 2/2 aspiration PNA and UTI. Pt was treated again in similar fashion and was dc/ to [facility] 4/13. While at SNF pt will  Be followed for Bipolar dx, r]tx with seroquel and depakote, dementia, tx with aricept and namenda and COPD , tx with prednisone and nebs.  Past Medical History  Diagnosis Date  . COPD (chronic obstructive pulmonary disease) (HCC)   . Hypertension   . Blind   . Hypothyroid   . Pacemaker   . Renal insufficiency   . Finger fracture   . Dementia   . Bipolar disorder (HCC)   . Dysphagia     needs thick nectar liquids  . Atrial fibrillation (HCC)   . Pneumonia   . Anxiety   . Diastolic heart failure [facility])     Past Surgical History   Procedure Laterality Date  . Insert / replace / remove pacemaker    . Tracheostomy  02/27/13    decannulated 03/29/13  . Gastrostomy tube placement  03/11/13      Medication List       This list is accurate as of: 02/29/16 11:59 PM.  Always use your most recent med list.               amoxicillin-clavulanate 500-125 MG tablet  Commonly known as:  AUGMENTIN  Place 1 tablet (500 mg total) into feeding tube 2 (two) times daily. Stick for 2 more days, stop on 4/15     beta carotene w/minerals tablet  Place 1 tablet into feeding tube daily.     docusate sodium 100 MG capsule  Commonly known as:  COLACE  Take 100 mg by mouth daily.     DULoxetine 60 MG capsule  Commonly known as:  CYMBALTA  Take 60 mg by mouth daily.     famotidine 20 MG tablet  Commonly known as:  PEPCID  Place 1 tablet (20 mg total) into feeding tube 2 (two) times daily.     feeding supplement (OSMOLITE 1.5 CAL) Liqd  Place 1,000 mLs into feeding tube daily. Per Tube, at 80 mL/hr, Every 24 hours     free water Soln  Place 100 mLs into feeding tube every 8 (eight) hours.     furosemide 20 MG tablet  Commonly known as:  LASIX  Place 1 tablet (20 mg total) into feeding tube daily.     ipratropium-albuterol 0.5-2.5 (3) MG/3ML Soln  Commonly known as:  DUONEB  Take 3 mLs by nebulization 4 (four) times daily.     ipratropium-albuterol 0.5-2.5 (3) MG/3ML Soln  Commonly known as:  DUONEB  Take 3 mLs by nebulization every 3 (three) hours as needed.     levothyroxine 125 MCG tablet  Commonly known as:  SYNTHROID, LEVOTHROID  Place 1 tablet (125 mcg total) into feeding tube daily before breakfast. Reported on 02/12/2016     lidocaine 5 %  Commonly known as:  LIDODERM  Place 1 patch onto the skin 2 (two) times daily. Apply 1/2 patch to each shoulder and to each knee for 12 hours, then remove     loperamide 1 MG/5ML solution  Commonly known as:  IMODIUM  Place 10 mLs (2 mg total) into feeding tube as  needed for diarrhea or loose stools.     Memantine HCl-Donepezil HCl 28-10 MG Cp24  Commonly known as:  NAMZARIC  1 capsule by PEG Tube route daily.     metoprolol tartrate 25 MG tablet  Commonly known as:  LOPRESSOR  Place 0.5 tablets (12.5 mg total) into feeding tube 2 (two) times daily.     multivitamin with minerals Tabs tablet  Place 1 tablet into feeding tube daily.     oxyCODONE 5 MG immediate release tablet  Commonly known as:  Oxy IR/ROXICODONE  Place 1 tablet (5 mg total) into feeding tube every 6 (six) hours as needed for severe pain.     OXYGEN  Inhale 2 Imperial Gallon into the lungs continuous. Reported on 02/12/2016     polyethylene glycol packet  Commonly known as:  MIRALAX / GLYCOLAX  Place 17 g into feeding tube daily.     potassium chloride 20 MEQ/15ML (10%) Soln  Place 15 mLs (20 mEq total) into feeding tube daily.     predniSONE 10 MG tablet  Commonly known as:  DELTASONE  Please take 30 mg  via PEG daily for 3 days, then 20 mg via PEG daily for 3 days, then 10 mg via PEG daily for 3 days then stop     QUEtiapine 50 MG tablet  Commonly known as:  SEROQUEL  Place 1 tablet (50 mg total) into feeding tube every evening.     SYSTANE BALANCE 0.6 % Soln  Generic drug:  Propylene Glycol  Place 1 drop into both eyes 2 (two) times daily.     valproic acid 250 MG/5ML syrup  Commonly known as:  DEPAKENE  Place 10 mLs (500 mg total) into feeding tube 2 (two) times daily.        No orders of the defined types were placed in this encounter.    Immunization History  Administered Date(s) Administered  . Influenza-Unspecified 09/24/2012, 08/27/2014, 12/22/2015  . Pneumococcal-Unspecified 01/12/2015    Social History  Substance Use Topics  . Smoking status: Former Smoker -- .5 years    Types: Cigarettes  . Smokeless tobacco: Not on file  . Alcohol Use: Not on file    Family history is  UTO 2/2 pt confusion  Review of Systems  UTO 2/2 pt's confusion;  nursing without concerns   Filed Vitals:   02/29/16 1340  BP: 97/53  Pulse: 72  Temp: 97 F (36.1 C)  Resp: 22    SpO2 Readings from Last 1 Encounters:  02/25/16 95%  Physical Exam  GENERAL APPEARANCE: Alert, conversant,  No acute distress.  SKIN: No diaphoresis rash HEAD: Normocephalic, atraumatic  EYES: Conjunctiva/lids clear. Pupils round, reactive. EOMs intact.  EARS: External exam WNL, canals clear. Hearing grossly normal.  NOSE: No deformity or discharge.  MOUTH/THROAT: Lips w/o lesions  RESPIRATORY: Breathing is even, unlabored. Lung sounds are decreased ,clear   CARDIOVASCULAR: Heart RRR no murmurs, rubs or gallops. No peripheral edema.   GASTROINTESTINAL: Abdomen is soft, non-tender, not distended w/ normal bowel soundsPEG GENITOURINARY: Bladder non tender, not distended  MUSCULOSKELETAL: No abnormal joints or musculature NEUROLOGIC:  Cranial nerves 2-12 grossly intact. Moves all extremities  PSYCHIATRIC: confused, demented, no behavioral issues  Patient Active Problem List   Diagnosis Date Noted  . NSVT (nonsustained ventricular tachycardia) (HCC) 03/08/2016  . Sepsis (HCC) 02/19/2016  . Encounter for palliative care   . Goals of care, counseling/discussion   . Septic shock (HCC) 02/12/2016  . HCAP (healthcare-associated pneumonia) 02/12/2016  . AKI (acute kidney injury) (HCC) 02/12/2016  . Elevated troponin 02/12/2016  . Hypotension   . Dysphagia, pharyngoesophageal phase 12/29/2015  . Acute on chronic diastolic heart failure (HCC) 10/29/2014  . Emphysema of lung (HCC) 09/17/2014  . Hypothyroidism due to acquired atrophy of thyroid 09/17/2014  . FTT (failure to thrive) in adult 07/13/2014  . UTI (urinary tract infection) 07/13/2014  . PEG (percutaneous endoscopic gastrostomy) status (HCC) 07/13/2014  . Acute encephalopathy 06/08/2014  . Bipolar affective disorder (HCC) 02/15/2013  . Tardive dyskinesia 02/15/2013  . Essential hypertension,  benign 02/15/2013  . COPD (chronic obstructive pulmonary disease) (HCC) 02/15/2013  . Constipation 02/15/2013  . Weight loss 02/15/2013  . Chronic pain 02/15/2013  . Dementia with behavioral disturbance 08/16/2012  . Pacemaker 08/16/2012    CBC    Component Value Date/Time   WBC 13.7* 02/24/2016 0516   WBC 6.2 12/30/2015   RBC 3.11* 02/24/2016 0516   HGB 10.2* 02/24/2016 0516   HCT 30.6* 02/24/2016 0516   PLT 220 02/24/2016 0516   MCV 98.4 02/24/2016 0516   LYMPHSABS 1.8 02/19/2016 1539   MONOABS 0.7 02/19/2016 1539   EOSABS 0.5 02/19/2016 1539   BASOSABS 0.0 02/19/2016 1539    CMP     Component Value Date/Time   NA 135 02/24/2016 0516   NA 146 12/30/2015   K 4.4 02/24/2016 0516   CL 95* 02/24/2016 0516   CO2 31 02/24/2016 0516   GLUCOSE 115* 02/24/2016 0516   BUN 46* 02/24/2016 0516   BUN 44* 12/30/2015   CREATININE 0.75 02/24/2016 0516   CREATININE 0.9 12/30/2015   CALCIUM 8.2* 02/24/2016 0516   PROT 5.1* 02/23/2016 0533   ALBUMIN 2.3* 02/23/2016 0533   AST 21 02/23/2016 0533   ALT 29 02/23/2016 0533   ALKPHOS 56 02/23/2016 0533   BILITOT 0.5 02/23/2016 0533   GFRNONAA >60 02/24/2016 0516   GFRAA >60 02/24/2016 0516    Lab Results  Component Value Date   HGBA1C 5.2 12/30/2015     Dg Chest Port 1 View  02/19/2016  CLINICAL DATA:  82 year old female respiratory distress and diaphoresis. Initial encounter. EXAM: PORTABLE CHEST 1 VIEW COMPARISON:  02/16/2016 and earlier. FINDINGS: Portable AP semi upright view at at 1519 hours. Patient remains rotated to the right, limiting visualization of the right lung. Stable cardiomegaly and mediastinal contours. Left chest cardiac pacemaker appear stable. Stable ventilation. No pneumothorax, acute pulmonary edema, or pleural effusion identified. Gas containing gastric hiatal hernia appears mildly increased. IMPRESSION: Rotated to the right. Stable  ventilation since 02/16/2016. Increased size of gas containing gastric hiatal  hernia. Electronically Signed   By: Odessa Fleming M.D.   On: 02/19/2016 15:39    Not all labs, radiology exams or other studies done during hospitalization come through on my EPIC note; however they are reviewed by me.    Assessment and Plan  Sepsis (HCC) As evidenced by fever, leukocytosis, tachypnea. Sources likely pulmonary HCAP vs. Aspiration PNA as patient is very high risk for aspiration despite the presence of the PEG tube - Sepsis physiology improving - Lactic acid is improving - Treated with IV vancomycin and cefepime during hospital stay, IV vancomycin stopped on 4/9, Cefadyl stopped on 4/12, transitioned to by mouth Augmentin to finish out in 2 days as an outpatient - Will need 2 view chest x-ray in 2 weeks SNF - Continue with the steroid taper on discharge; cont augmentin for 2 more days  Acute encephalopathy Likely in the setting of #1 - Significantly improved, back to baseline, communicative, but confused  FTT (failure to thrive) in adult Patient is status post PEG tube placement, per previous hospitalization after extensive discussion with the family and patient was also allowed a dysphagia 1 diet for comfort, and risks have been discussed extensively with family members in the past. Suspect this plays a big role in her recurrent aspiration. - Continue NPO, if she is to be full scope of treatment as per POA, absolutely nothing to eat by mouth otherwise this is bound to happen again.  SNF - in all probability pt will be eating and this will happen repeatedly  NSVT (nonsustained ventricular tachycardia) (HCC) on 4/9, obtained 2D echo, normal EF, keep K>4 Mg>2 - added low dose Metoprolol, continue - no further episodes   Acute on chronic diastolic heart failure (HCC) Monitored closely while receiving IV fluids for #1, she has chronic lower extremity edema, chest x-ray without significant fluid overload, no crackles on exam SNF - cont  home dose Lasix on discharge   COPD  (chronic obstructive pulmonary disease) (HCC) SNF - cont prednisone and duoneb  Dementia with behavioral disturbance SNF - cont namenda and aricpt  Bipolar affective disorder (HCC) SNF - cont seroquel and valproic acid   Time spent > 45 min;> 50% of time with patient was spent reviewing records, labs, tests and studies, counseling and developing plan of care  Merrilee Seashore   MD

## 2016-03-08 ENCOUNTER — Encounter: Payer: Self-pay | Admitting: Internal Medicine

## 2016-03-08 DIAGNOSIS — I4729 Other ventricular tachycardia: Secondary | ICD-10-CM | POA: Insufficient documentation

## 2016-03-08 DIAGNOSIS — I472 Ventricular tachycardia: Secondary | ICD-10-CM | POA: Insufficient documentation

## 2016-03-08 NOTE — Assessment & Plan Note (Signed)
SNF - cont prednisone and duoneb

## 2016-03-08 NOTE — Assessment & Plan Note (Signed)
SNF - cont namenda and aricpt

## 2016-03-08 NOTE — Assessment & Plan Note (Signed)
Patient is status post PEG tube placement, per previous hospitalization after extensive discussion with the family and patient was also allowed a dysphagia 1 diet for comfort, and risks have been discussed extensively with family members in the past. Suspect this plays a big role in her recurrent aspiration. - Continue NPO, if she is to be full scope of treatment as per POA, absolutely nothing to eat by mouth otherwise this is bound to happen again.  SNF - in all probability pt will be eating and this will happen repeatedly

## 2016-03-08 NOTE — Assessment & Plan Note (Signed)
As evidenced by fever, leukocytosis, tachypnea. Sources likely pulmonary HCAP vs. Aspiration PNA as patient is very high risk for aspiration despite the presence of the PEG tube - Sepsis physiology improving - Lactic acid is improving - Treated with IV vancomycin and cefepime during hospital stay, IV vancomycin stopped on 4/9, Cefadyl stopped on 4/12, transitioned to by mouth Augmentin to finish out in 2 days as an outpatient - Will need 2 view chest x-ray in 2 weeks SNF - Continue with the steroid taper on discharge; cont augmentin for 2 more days

## 2016-03-08 NOTE — Assessment & Plan Note (Signed)
Monitored closely while receiving IV fluids for #1, she has chronic lower extremity edema, chest x-ray without significant fluid overload, no crackles on exam SNF - cont  home dose Lasix on discharge

## 2016-03-08 NOTE — Assessment & Plan Note (Signed)
SNF - cont seroquel and valproic acid

## 2016-03-08 NOTE — Assessment & Plan Note (Signed)
on 4/9, obtained 2D echo, normal EF, keep K>4 Mg>2 - added low dose Metoprolol, continue - no further episodes

## 2016-03-08 NOTE — Assessment & Plan Note (Signed)
Likely in the setting of #1 - Significantly improved, back to baseline, communicative, but confused

## 2016-03-22 ENCOUNTER — Emergency Department (HOSPITAL_COMMUNITY): Payer: Medicare Other

## 2016-03-22 ENCOUNTER — Emergency Department (HOSPITAL_COMMUNITY)
Admission: EM | Admit: 2016-03-22 | Discharge: 2016-04-14 | Disposition: E | Payer: Medicare Other | Attending: Emergency Medicine | Admitting: Emergency Medicine

## 2016-03-22 ENCOUNTER — Encounter (HOSPITAL_COMMUNITY): Payer: Self-pay | Admitting: *Deleted

## 2016-03-22 DIAGNOSIS — Z87448 Personal history of other diseases of urinary system: Secondary | ICD-10-CM | POA: Insufficient documentation

## 2016-03-22 DIAGNOSIS — J449 Chronic obstructive pulmonary disease, unspecified: Secondary | ICD-10-CM | POA: Diagnosis not present

## 2016-03-22 DIAGNOSIS — F419 Anxiety disorder, unspecified: Secondary | ICD-10-CM | POA: Insufficient documentation

## 2016-03-22 DIAGNOSIS — F0390 Unspecified dementia without behavioral disturbance: Secondary | ICD-10-CM | POA: Diagnosis not present

## 2016-03-22 DIAGNOSIS — I10 Essential (primary) hypertension: Secondary | ICD-10-CM | POA: Insufficient documentation

## 2016-03-22 DIAGNOSIS — Z950 Presence of cardiac pacemaker: Secondary | ICD-10-CM | POA: Insufficient documentation

## 2016-03-22 DIAGNOSIS — H540 Blindness, both eyes: Secondary | ICD-10-CM | POA: Diagnosis not present

## 2016-03-22 DIAGNOSIS — Z79899 Other long term (current) drug therapy: Secondary | ICD-10-CM | POA: Insufficient documentation

## 2016-03-22 DIAGNOSIS — Z87891 Personal history of nicotine dependence: Secondary | ICD-10-CM | POA: Insufficient documentation

## 2016-03-22 DIAGNOSIS — R140 Abdominal distension (gaseous): Secondary | ICD-10-CM | POA: Diagnosis not present

## 2016-03-22 DIAGNOSIS — E039 Hypothyroidism, unspecified: Secondary | ICD-10-CM | POA: Diagnosis not present

## 2016-03-22 DIAGNOSIS — F319 Bipolar disorder, unspecified: Secondary | ICD-10-CM | POA: Insufficient documentation

## 2016-03-22 DIAGNOSIS — I469 Cardiac arrest, cause unspecified: Secondary | ICD-10-CM | POA: Insufficient documentation

## 2016-03-22 MED ORDER — CALCIUM CHLORIDE 10 % IV SOLN
INTRAVENOUS | Status: AC | PRN
  Administered 2016-03-22: 1 g via INTRAVENOUS

## 2016-03-22 MED ORDER — SODIUM BICARBONATE 8.4 % IV SOLN
INTRAVENOUS | Status: AC | PRN
  Administered 2016-03-22 (×2): 50 meq via INTRAVENOUS

## 2016-03-22 MED ORDER — MAGNESIUM SULFATE 50 % IJ SOLN
INTRAMUSCULAR | Status: AC | PRN
  Administered 2016-03-22: 2 g via INTRAVENOUS

## 2016-03-22 MED ORDER — DEXTROSE 50 % IV SOLN
INTRAVENOUS | Status: AC | PRN
  Administered 2016-03-22: 1 via INTRAVENOUS

## 2016-03-22 MED ORDER — EPINEPHRINE HCL 0.1 MG/ML IJ SOSY
PREFILLED_SYRINGE | INTRAMUSCULAR | Status: AC | PRN
  Administered 2016-03-22 (×8): 1 mg via INTRAVENOUS

## 2016-03-23 MED FILL — Medication: Qty: 1 | Status: AC

## 2016-04-14 NOTE — Code Documentation (Signed)
Patient time of death occurred at 8:27

## 2016-04-14 NOTE — Progress Notes (Signed)
Responded to page to provide support to patient son who had been placed in consultation room B.  Per nurse Toniann Fail( Wendy) patient came from SNF after experienceing SOB. Patient reported to ED as CPR and later passed.  I accompanied nurse and EDP to update patient's son ([name]) on patient status. Patient son ([name]) indicated that he had a sister in [place].and that  he would contact all person who needed to be informed. I escorted patient son to room C26 to have some private time with his mother. Son did not know a  Funeral home at the moment . I gave son a patient placement card and informed him what he needed to do. I remain with family until his departure. Provided emotional and grief support.   2016-01-21 1100  Clinical Encounter Type  Visited With Family;Health care provider  Visit Type Initial;Spiritual support;Death;ED  Referral From Nurse  Spiritual Encounters  Spiritual Needs Emotional;Grief support  Stress Factors  Family Stress Factors Exhausted;Loss  [name], [name], Chaplain,pager [phone]

## 2016-04-14 NOTE — ED Provider Notes (Signed)
INTUBATION Performed by: Carolee RotaGEIPLE,Mussa Groesbeck S, Dr. Clayborne DanaMesner  Required items: required blood products, implants, devices, and special equipment available Patient identity confirmed: provided demographic data and hospital-assigned identification number Time out: Immediately prior to procedure a "time out" was called to verify the correct patient, procedure, equipment, support staff and site/side marked as required.  Indications: respiratory arrest  Intubation method: Glidescope Laryngoscopy   Preoxygenation: BVM  Sedatives: none Paralytic: none  Tube Size: 7.5 cuffed  Post-procedure assessment: chest rise and ETCO2 monitor Breath sounds: equal and absent over the epigastrium Tube secured with: ETT holder  Chest x-ray findings: not performed, patient expired.  Patient tolerated the procedure well with no immediate complications.     Renne CriglerJoshua Gopal Malter, PA-C 10/05/2016 [id number]  Marily MemosJason Mesner, MD 03/23/16 1012

## 2016-04-14 NOTE — ED Notes (Signed)
This RN called [facility] facility & spoke with RN who reports that [name], was notified that the pt was transported from their facility to the hospital, this RN updated facility on pts outcome, the SNF provided [name]'s phone # for family contact

## 2016-04-14 NOTE — Code Documentation (Signed)
No pulses verified by MD & RN, magnet placed on chest to stop pacemaker activity

## 2016-04-14 NOTE — Progress Notes (Signed)
Pt brought in by ems being Manually ventilated. RT continued manually ventilating pt until MD ready to intubate. Pt intubated using Glidescope with Mac #4 blade and 7.5 ett secured at 24 at the lip. Pt continued to be manually ventilated throughout duration of code until pt expired. Pt suctioned orally and through ETT with copious amounts of yellow thick secretions removed. Positive color change noted on etco2, bilateral coarse rhonchi BS throughout. No CXR obtained due to pt expiring. ETCO2 used with Zoll with reading of 8 during CPR. Pt remains intubated pending possibility of ME case. RT will continue to monitor.

## 2016-04-14 NOTE — Code Documentation (Signed)
This RN spoke with Jiles GarterBo Coopersmith, the pts family who will be at the hospital in 30 mins for the MD to speak with him

## 2016-04-14 NOTE — Procedures (Signed)
Intubation Procedure Note Lenon OmsClare Ewing [id number] 09/19/1928  Procedure: Intubation Indications: Respiratory insufficiency  Procedure Details Consent: Unable to obtain consent because of emergent medical necessity. Time Out: Verified patient identification, verified procedure, site/side was marked, verified correct patient position, special equipment/implants available, medications/allergies/relevent history reviewed, required imaging and test results available.  Performed  Maximum sterile technique was used including gloves, gown, hand hygiene and mask.  MAC and 4    Evaluation Hemodynamic Status: pt getting CPR; O2 sats: pt getting CPR Patient's Current Condition: pt expired  Complications: No apparent complications Patient did not tolerate procedure well. Chest X-ray ordered to verify placement.  CXR: pending.  Pt intubated during CPR using glidescope with #3 Mac blade and 7.5 ett secured at 24 at the lip. Pt with bilateral Rhonchi/Coarse BS throughout, positive color change on etco2, no CXR done due to pt expiring. ETCO2 monitored on Zoll showing reading of 8 during CPR. Pt expired at 0827. Pt remains intubated per ED MD until ME notified. RT will continue to monitor.    Carolan ShiverKelley, Severus Brodzinski M 03/23/2016

## 2016-04-14 NOTE — ED Provider Notes (Signed)
CSN: [id number]     Arrival date & time Mar 27, 2016  0807 History   First MD Initiated Contact with Patient 2016/03/27 [phone]     Chief Complaint  Patient presents with  . Cardiac Arrest   (Consider location/radiation/quality/duration/timing/severity/associated sxs/prior Treatment) Patient is a 82 y.o. female presenting with altered mental status.  Altered Mental Status Presenting symptoms: unresponsiveness   Severity:  Severe Most recent episode:  Today Episode history:  Unable to specify Duration:  1 day Timing:  Unable to specify Progression:  Unable to specify Chronicity:  New Context: recent illness     Past Medical History  Diagnosis Date  . COPD (chronic obstructive pulmonary disease) (HCC)   . Hypertension   . Blind   . Hypothyroid   . Pacemaker   . Renal insufficiency   . Finger fracture   . Dementia   . Bipolar disorder (HCC)   . Dysphagia     needs thick nectar liquids  . Atrial fibrillation (HCC)   . Pneumonia   . Anxiety   . Diastolic heart failure [facility])    Past Surgical History  Procedure Laterality Date  . Insert / replace / remove pacemaker    . Tracheostomy  02/27/13    decannulated 03/29/13  . Gastrostomy tube placement  03/11/13   No family history on file. Social History  Substance Use Topics  . Smoking status: Former Smoker -- .5 years    Types: Cigarettes  . Smokeless tobacco: Not on file  . Alcohol Use: Not on file   OB History    No data available     Review of Systems  Unable to perform ROS: Patient unresponsive    Allergies  Review of patient's allergies indicates no known allergies.  Home Medications   Prior to Admission medications   Medication Sig Start Date End Date Taking? Authorizing Provider  amoxicillin-clavulanate (AUGMENTIN) 500-125 MG tablet Place 1 tablet (500 mg total) into feeding tube 2 (two) times daily. Stick for 2 more days, stop on 4/15 02/25/16   Leana Roe Elgergawy, MD  beta carotene w/minerals (OCUVITE) tablet  Place 1 tablet into feeding tube daily. 02/18/16   Shanker Levora Dredge, MD  docusate sodium (COLACE) 100 MG capsule Take 100 mg by mouth daily.    Historical Provider, MD  DULoxetine (CYMBALTA) 60 MG capsule Take 60 mg by mouth daily.    Historical Provider, MD  famotidine (PEPCID) 20 MG tablet Place 1 tablet (20 mg total) into feeding tube 2 (two) times daily. 02/18/16   Shanker Levora Dredge, MD  furosemide (LASIX) 20 MG tablet Place 1 tablet (20 mg total) into feeding tube daily. 02/18/16   Shanker Levora Dredge, MD  ipratropium-albuterol (DUONEB) 0.5-2.5 (3) MG/3ML SOLN Take 3 mLs by nebulization 4 (four) times daily. Patient taking differently: Take 3 mLs by nebulization every 4 (four) hours.  02/18/16   Shanker Levora Dredge, MD  ipratropium-albuterol (DUONEB) 0.5-2.5 (3) MG/3ML SOLN Take 3 mLs by nebulization every 3 (three) hours as needed. Patient taking differently: Take 3 mLs by nebulization every 3 (three) hours as needed (SOB, wheezing).  02/18/16   Shanker Levora Dredge, MD  levothyroxine (SYNTHROID, LEVOTHROID) 125 MCG tablet Place 1 tablet (125 mcg total) into feeding tube daily before breakfast. Reported on 02/12/2016 02/18/16   Maretta Bees, MD  lidocaine (LIDODERM) 5 % Place 1 patch onto the skin 2 (two) times daily. Apply 1/2 patch to each shoulder and to each knee for 12 hours, then remove  Historical Provider, MD  loperamide (IMODIUM) 1 MG/5ML solution Place 10 mLs (2 mg total) into feeding tube as needed for diarrhea or loose stools. 02/18/16   Shanker Levora Dredge, MD  Memantine HCl-Donepezil HCl (NAMZARIC) 28-10 MG CP24 1 capsule by PEG Tube route daily. 02/18/16   Shanker Levora Dredge, MD  metoprolol tartrate (LOPRESSOR) 25 MG tablet Place 0.5 tablets (12.5 mg total) into feeding tube 2 (two) times daily. 02/25/16   Leana Roe Elgergawy, MD  Multiple Vitamin (MULTIVITAMIN WITH MINERALS) TABS tablet Place 1 tablet into feeding tube daily. 02/18/16   Shanker Levora Dredge, MD  Nutritional Supplements (FEEDING  SUPPLEMENT, OSMOLITE 1.5 CAL,) LIQD Place 1,000 mLs into feeding tube daily. Per Tube, at 80 mL/hr, Every 24 hours 02/18/16   Shanker Levora Dredge, MD  oxyCODONE (OXY IR/ROXICODONE) 5 MG immediate release tablet Place 1 tablet (5 mg total) into feeding tube every 6 (six) hours as needed for severe pain. 02/25/16   Starleen Arms, MD  OXYGEN Inhale 2 Imperial Gallon into the lungs continuous. Reported on 02/12/2016    Historical Provider, MD  polyethylene glycol (MIRALAX / GLYCOLAX) packet Place 17 g into feeding tube daily. 02/18/16   Shanker Levora Dredge, MD  potassium chloride 20 MEQ/15ML (10%) SOLN Place 15 mLs (20 mEq total) into feeding tube daily. 02/18/16   Shanker Levora Dredge, MD  predniSONE (DELTASONE) 10 MG tablet Please take 30 mg  via PEG daily for 3 days, then 20 mg via PEG daily for 3 days, then 10 mg via PEG daily for 3 days then stop 02/25/16   Leana Roe Elgergawy, MD  Propylene Glycol (SYSTANE BALANCE) 0.6 % SOLN Place 1 drop into both eyes 2 (two) times daily.     Historical Provider, MD  QUEtiapine (SEROQUEL) 50 MG tablet Place 1 tablet (50 mg total) into feeding tube every evening. 02/18/16   Shanker Levora Dredge, MD  valproic acid (DEPAKENE) 250 MG/5ML syrup Place 10 mLs (500 mg total) into feeding tube 2 (two) times daily. 02/25/16   Starleen Arms, MD  Water For Irrigation, Sterile (FREE WATER) SOLN Place 100 mLs into feeding tube every 8 (eight) hours. 02/18/16   Shanker Levora Dredge, MD   There were no vitals taken for this visit. Physical Exam  Constitutional: She appears well-developed and well-nourished. She appears distressed.  HENT:  Head: Normocephalic and atraumatic.  Eyes:  Fixed and dilated.   Neck: Normal range of motion.  Cardiovascular:  Asystole  Pulmonary/Chest: No stridor. She is in respiratory distress (apneic).  Abdominal: She exhibits distension.  Musculoskeletal: She exhibits no edema.  Neurological:  GCS 3  Skin: Skin is warm.  Nursing note and vitals  reviewed.   ED Course  Procedures (including critical care time)  Cardiopulmonary Resuscitation (CPR) Procedure Note Directed/Performed by: Marily Memos I personally directed ancillary staff and/or performed CPR in an effort to regain return of spontaneous circulation and to maintain cardiac, neuro and systemic perfusion.   CRITICAL CARE Performed by: Marily Memos   Total critical care time: 32 minutes  Critical care time was exclusive of separately billable procedures and treating other patients.  Critical care was necessary to treat or prevent imminent or life-threatening deterioration.  Critical care was time spent personally by me on the following activities: development of treatment plan with patient and/or surrogate as well as nursing, discussions with consultants, evaluation of patient's response to treatment, examination of patient, obtaining history from patient or surrogate, ordering and performing treatments and interventions, ordering  and review of laboratory studies, ordering and review of radiographic studies, pulse oximetry and re-evaluation of patient's condition.   Labs Review Labs Reviewed - No data to display  Imaging Review No results found. I have personally reviewed and evaluated these images and lab results as part of my medical decision-making.   EKG Interpretation None      MDM   Final diagnoses:  Cardiopulmonary arrest [facility](HCC)   82 year old female presented as CPR progress. EMS was called out secondary to altered mental status on their arrival patient was tachypneic and febrile without an obtainable BP and a thready pulse. She had vomitus all over her front and had severely decreased rhonchorous breath sounds and mottled warm skin. In route patient lost pulses and CPR was initiated.  For details of code in the emergency department please refer to code sheet. In brief, patient had high quality chest compressions for approximately 30 minutes with  multiple doses of epinephrine, calcium, bicarbonate, D50, magnesium. She was intubated as noted in the other providers note. Considered multiple causes as the reason for her death, however most likely seemed to be aspiration pneumonitis that turned into pneumonia with likely severe acidosis. After all interventions patient had one episode of V. fib for which she was attempted to be cardioverted however she continued to be in PEA. Magnet was put over her pacemaker and continued to be in PEA without any other suggestions for possible causes that had a hearty been addressed patient was pronounced dead at 0827. 0827      Marily MemosJason Daimien Patmon, MD 03/23/16 [phone]

## 2016-04-14 NOTE — Code Documentation (Signed)
US completed at bedside, no cardiac activity  Noted during pulse check

## 2016-04-14 NOTE — ED Notes (Signed)
Pt in via [facility] from [facility], per report pt was mottled upon their arrival with RR 40, pt had copious amts of emesis present, pt was reported to have pacemaker, EMS placed bil NPA & started to assist ventilations via BVM, per EMS pt became pulseless & received x 3 mins of chest compressions pta, [facility], bvm ventilations were continued & CPR was continued

## 2016-04-14 NOTE — ED Notes (Signed)
Son with pt, son requested scissors to take a clipping of hair from his mom, pt provided scissors & specimen cup, pt declines assistance at this time, pts family denies need for further assistance

## 2016-04-14 DEATH — deceased

## 2016-07-10 IMAGING — XA IR GI TUBE CONTRAST INJECTION
1 series · 2 of 2 positions shown · non-contrast
Comparison: none

CLINICAL DATA: 87-year-old female with a history of prior
percutaneous gastrostomy tube placement.

[Series 300: ir replc gastro/colonic tube percut w/fl · 2 of 2 slices shown]
[im 1/2]
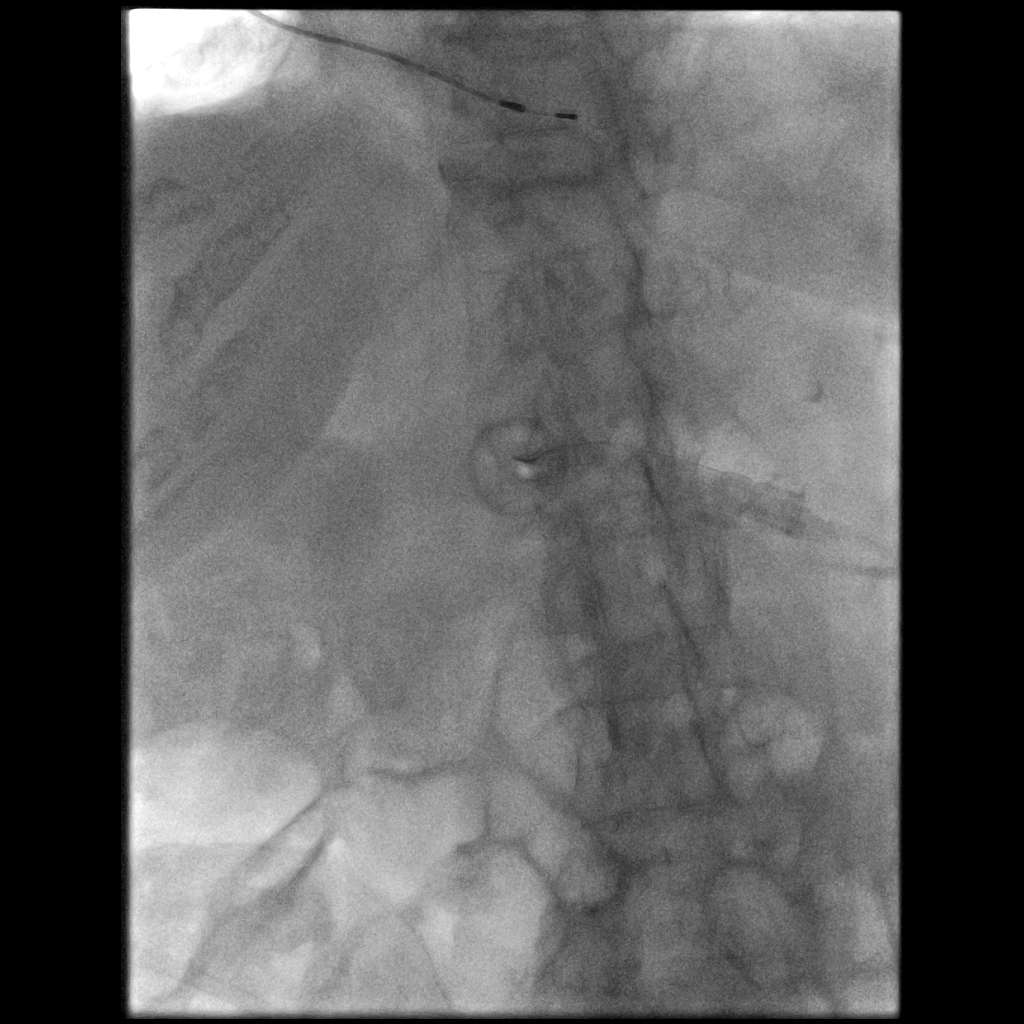
[im 2/2]
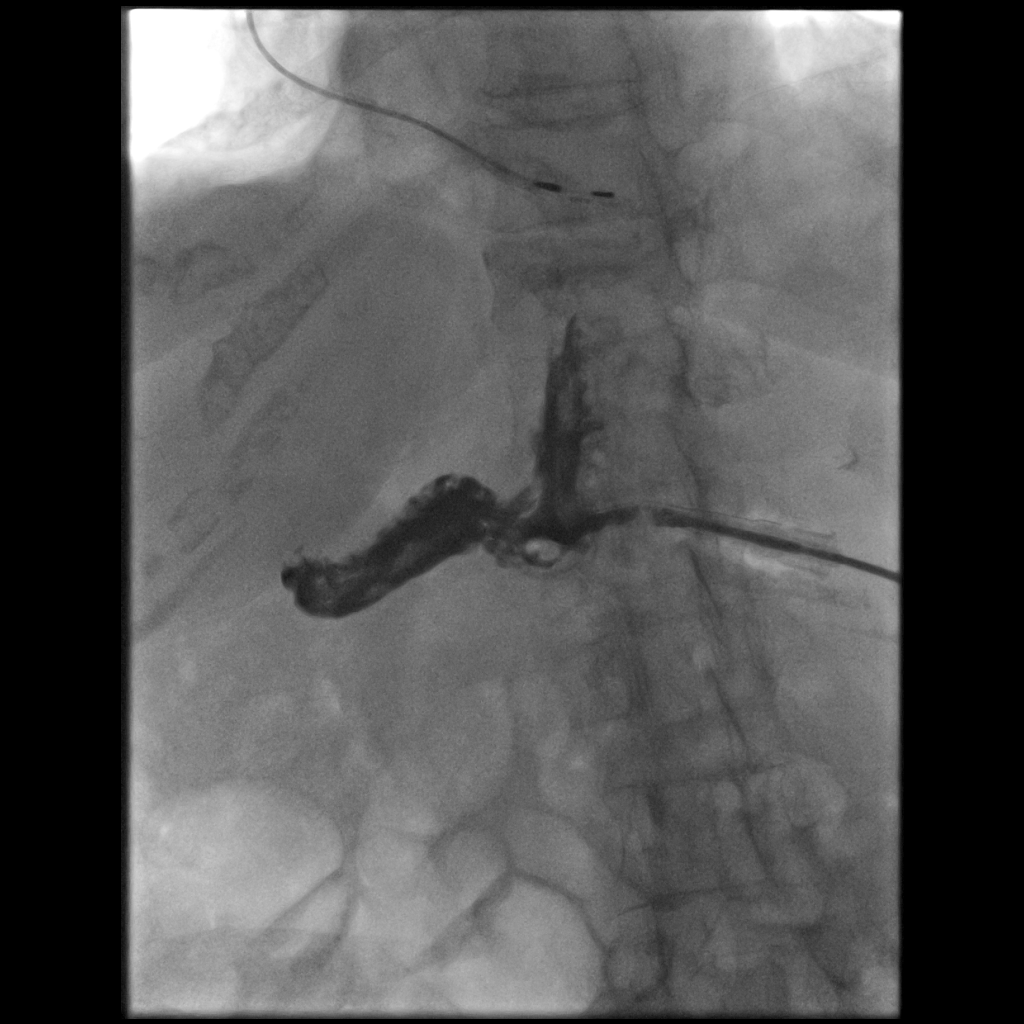

[2 of 2 positions shown; findings below may reference images not displayed]

Patient has been referred by her facility for evaluation of a
possible damage tube.

Her condition was discussed at length with her son on the telephone
before checking the tube. The family's main concern is her comfort,
as the tube has been working well.

On inspection, the tube is not damaged.

EXAM:
GI TUBE INJECTION

ANESTHESIA/SEDATION:
None

MEDICATIONS:
None

CONTRAST:  10 cc contrast

PROCEDURE:
The procedure, risks, benefits, and alternatives were explained to
the patient's family. Questions regarding the procedure were
encouraged and answered. The patient understands and consents to the
procedure.

Patient is positioned supine position on the fluoroscopy table.
Contrast injection confirmed location of the tube within the
stomach, with patent gastrostomy tube.

Patient tolerated the procedure well and remained hemodynamically
stable throughout.

No complications were encountered and no significant blood loss was
encounter

COMPLICATIONS:
None
FINDINGS: Patent gastrostomy tube without leakage or identifiable damage.
Location is within the stomach.
IMPRESSION: Status post gastrostomy tube injection confirming position within
the stomach and no identifiable damage.
